# Patient Record
Sex: Female | Born: 1960
Health system: Southern US, Community
[De-identification: ages and names within clinical notes are randomized; demographics above are authoritative.]

## PROBLEM LIST (undated history)

## (undated) DIAGNOSIS — M797 Fibromyalgia: Secondary | ICD-10-CM

## (undated) DIAGNOSIS — K222 Esophageal obstruction: Secondary | ICD-10-CM

## (undated) DIAGNOSIS — M1990 Unspecified osteoarthritis, unspecified site: Secondary | ICD-10-CM

## (undated) DIAGNOSIS — E063 Autoimmune thyroiditis: Secondary | ICD-10-CM

## (undated) DIAGNOSIS — L719 Rosacea, unspecified: Secondary | ICD-10-CM

## (undated) DIAGNOSIS — F329 Major depressive disorder, single episode, unspecified: Secondary | ICD-10-CM

## (undated) DIAGNOSIS — F32A Depression, unspecified: Secondary | ICD-10-CM

## (undated) DIAGNOSIS — G43909 Migraine, unspecified, not intractable, without status migrainosus: Secondary | ICD-10-CM

## (undated) DIAGNOSIS — K449 Diaphragmatic hernia without obstruction or gangrene: Secondary | ICD-10-CM

## (undated) DIAGNOSIS — K219 Gastro-esophageal reflux disease without esophagitis: Secondary | ICD-10-CM

## (undated) DIAGNOSIS — C4490 Unspecified malignant neoplasm of skin, unspecified: Secondary | ICD-10-CM

## (undated) DIAGNOSIS — F419 Anxiety disorder, unspecified: Secondary | ICD-10-CM

## (undated) DIAGNOSIS — M3500 Sicca syndrome, unspecified: Secondary | ICD-10-CM

## (undated) DIAGNOSIS — S72002A Fracture of unspecified part of neck of left femur, initial encounter for closed fracture: Secondary | ICD-10-CM

## (undated) DIAGNOSIS — T7840XA Allergy, unspecified, initial encounter: Secondary | ICD-10-CM

## (undated) DIAGNOSIS — G629 Polyneuropathy, unspecified: Secondary | ICD-10-CM

## (undated) DIAGNOSIS — N879 Dysplasia of cervix uteri, unspecified: Secondary | ICD-10-CM

## (undated) DIAGNOSIS — E039 Hypothyroidism, unspecified: Secondary | ICD-10-CM

## (undated) HISTORY — DX: Migraine, unspecified, not intractable, without status migrainosus: G43.909

## (undated) HISTORY — DX: Depression, unspecified: F32.A

## (undated) HISTORY — DX: Sjogren syndrome, unspecified: M35.00

## (undated) HISTORY — DX: Rosacea, unspecified: L71.9

## (undated) HISTORY — PX: BREAST SURGERY: SHX581

## (undated) HISTORY — DX: Unspecified osteoarthritis, unspecified site: M19.90

## (undated) HISTORY — PX: FRACTURE SURGERY: SHX138

## (undated) HISTORY — DX: Polyneuropathy, unspecified: G62.9

## (undated) HISTORY — DX: Fibromyalgia: M79.7

## (undated) HISTORY — DX: Gastro-esophageal reflux disease without esophagitis: K21.9

## (undated) HISTORY — DX: Autoimmune thyroiditis: E06.3

## (undated) HISTORY — PX: JOINT REPLACEMENT: SHX530

## (undated) HISTORY — DX: Dysplasia of cervix uteri, unspecified: N87.9

## (undated) HISTORY — DX: Allergy, unspecified, initial encounter: T78.40XA

## (undated) HISTORY — DX: Major depressive disorder, single episode, unspecified: F32.9

## (undated) HISTORY — DX: Unspecified malignant neoplasm of skin, unspecified: C44.90

## (undated) HISTORY — DX: Anxiety disorder, unspecified: F41.9

## (undated) HISTORY — PX: COSMETIC SURGERY: SHX468

## (undated) HISTORY — DX: Hypothyroidism, unspecified: E03.9

## (undated) HISTORY — DX: Fracture of unspecified part of neck of left femur, initial encounter for closed fracture: S72.002A

## (undated) HISTORY — PX: TONSILLECTOMY AND ADENOIDECTOMY: SUR1326

---

## 1987-08-27 HISTORY — PX: DILATION AND CURETTAGE OF UTERUS: SHX78

## 1988-08-26 DIAGNOSIS — N879 Dysplasia of cervix uteri, unspecified: Secondary | ICD-10-CM

## 1988-08-26 HISTORY — DX: Dysplasia of cervix uteri, unspecified: N87.9

## 1998-11-16 ENCOUNTER — Other Ambulatory Visit: Admission: RE | Admit: 1998-11-16 | Discharge: 1998-11-16 | Payer: Self-pay | Admitting: Obstetrics and Gynecology

## 2000-01-17 ENCOUNTER — Other Ambulatory Visit: Admission: RE | Admit: 2000-01-17 | Discharge: 2000-01-17 | Payer: Self-pay | Admitting: Obstetrics and Gynecology

## 2001-05-01 ENCOUNTER — Other Ambulatory Visit: Admission: RE | Admit: 2001-05-01 | Discharge: 2001-05-01 | Payer: Self-pay | Admitting: Obstetrics and Gynecology

## 2002-06-18 ENCOUNTER — Other Ambulatory Visit: Admission: RE | Admit: 2002-06-18 | Discharge: 2002-06-18 | Payer: Self-pay | Admitting: Obstetrics and Gynecology

## 2003-08-01 ENCOUNTER — Other Ambulatory Visit: Admission: RE | Admit: 2003-08-01 | Discharge: 2003-08-01 | Payer: Self-pay | Admitting: Obstetrics and Gynecology

## 2003-08-27 DIAGNOSIS — S72002A Fracture of unspecified part of neck of left femur, initial encounter for closed fracture: Secondary | ICD-10-CM

## 2003-08-27 HISTORY — PX: TOTAL HIP ARTHROPLASTY: SHX124

## 2003-08-27 HISTORY — PX: ORIF HIP FRACTURE: SHX2125

## 2003-08-27 HISTORY — DX: Fracture of unspecified part of neck of left femur, initial encounter for closed fracture: S72.002A

## 2004-04-20 ENCOUNTER — Inpatient Hospital Stay (HOSPITAL_COMMUNITY): Admission: EM | Admit: 2004-04-20 | Discharge: 2004-04-21 | Payer: Self-pay

## 2004-07-31 ENCOUNTER — Other Ambulatory Visit: Admission: RE | Admit: 2004-07-31 | Discharge: 2004-07-31 | Payer: Self-pay | Admitting: Obstetrics and Gynecology

## 2004-08-16 ENCOUNTER — Inpatient Hospital Stay (HOSPITAL_COMMUNITY): Admission: RE | Admit: 2004-08-16 | Discharge: 2004-08-19 | Payer: Self-pay | Admitting: Orthopaedic Surgery

## 2004-09-06 ENCOUNTER — Encounter: Admission: RE | Admit: 2004-09-06 | Discharge: 2004-09-06 | Payer: Self-pay | Admitting: Obstetrics and Gynecology

## 2005-08-26 HISTORY — PX: BREAST ENHANCEMENT SURGERY: SHX7

## 2009-04-12 LAB — HM PAP SMEAR: HM Pap smear: NEGATIVE

## 2010-02-13 ENCOUNTER — Emergency Department (HOSPITAL_COMMUNITY): Admission: EM | Admit: 2010-02-13 | Discharge: 2010-02-14 | Payer: Self-pay | Admitting: Emergency Medicine

## 2010-02-14 ENCOUNTER — Encounter (INDEPENDENT_AMBULATORY_CARE_PROVIDER_SITE_OTHER): Payer: Self-pay | Admitting: *Deleted

## 2010-09-25 NOTE — Letter (Signed)
Summary: New Patient letter  [facility]  [address] [place], [place] [address]   Phone: [phone]  Fax: [phone]       02/14/2010 MRN: [id number]  [name] [address] [place], [place]  [address]  Dear [name],  Welcome to the Gastroenterology Division at [facility].    You are scheduled to see Dr.  Jarold Motto on 03-20-10 at 10:00a.m. on the 3rd floor at [facility], [address].  We ask that you try to arrive at our office 15 minutes prior to your appointment time to allow for check-in.  We would like you to complete the enclosed self-administered evaluation form prior to your visit and bring it with you on the day of your appointment.  We will review it with you.  Also, please bring a complete list of all your medications or, if you prefer, bring the medication bottles and we will list them.  Please bring your insurance card so that we may make a copy of it.  If your insurance requires a referral to see a specialist, please bring your referral form from your primary care physician.  Co-payments are due at the time of your visit and may be paid by cash, check or credit card.     Your office visit will consist of a consult with your physician (includes a physical exam), any laboratory testing he/she may order, scheduling of any necessary diagnostic testing (e.g. x-ray, ultrasound, CT-scan), and scheduling of a procedure (e.g. Endoscopy, Colonoscopy) if required.  Please allow enough time on your schedule to allow for any/all of these possibilities.    If you cannot keep your appointment, please call [phone] to cancel or reschedule prior to your appointment date.  This allows [place] the opportunity to schedule an appointment for another patient in need of care.  If you do not cancel or reschedule by 5 p.m. the business day prior to your appointment date, you will be charged a $50.00 late cancellation/no-show fee.    Thank you for  choosing [facility] for your medical needs.  We appreciate the opportunity to care for you.  Please visit [place] at our website  to learn more about our practice.                     Sincerely,                                                             The Gastroenterology Division

## 2010-11-11 LAB — CBC
HCT: 38.8 % (ref 36.0–46.0)
Hemoglobin: 13.5 g/dL (ref 12.0–15.0)
MCHC: 34.8 g/dL (ref 30.0–36.0)
MCV: 97.2 fL (ref 78.0–100.0)
RBC: 4 MIL/uL (ref 3.87–5.11)
RDW: 15.4 % (ref 11.5–15.5)

## 2010-11-11 LAB — COMPREHENSIVE METABOLIC PANEL
Albumin: 4.1 g/dL (ref 3.5–5.2)
Alkaline Phosphatase: 52 U/L (ref 39–117)
BUN: 16 mg/dL (ref 6–23)
Calcium: 9.5 mg/dL (ref 8.4–10.5)
Creatinine, Ser: 1.09 mg/dL (ref 0.4–1.2)
Glucose, Bld: 95 mg/dL (ref 70–99)
Total Protein: 7.1 g/dL (ref 6.0–8.3)

## 2010-11-11 LAB — DIFFERENTIAL
Basophils Absolute: 0 10*3/uL (ref 0.0–0.1)
Basophils Relative: 0 % (ref 0–1)
Eosinophils Absolute: 0.1 10*3/uL (ref 0.0–0.7)
Eosinophils Relative: 1 % (ref 0–5)
Monocytes Absolute: 0.9 10*3/uL (ref 0.1–1.0)
Monocytes Relative: 9 % (ref 3–12)
Neutro Abs: 7.1 10*3/uL (ref 1.7–7.7)

## 2010-11-11 LAB — URINALYSIS, ROUTINE W REFLEX MICROSCOPIC
Bilirubin Urine: NEGATIVE
Glucose, UA: NEGATIVE mg/dL
Ketones, ur: 15 mg/dL — AB
Protein, ur: NEGATIVE mg/dL
pH: 8.5 — ABNORMAL HIGH (ref 5.0–8.0)

## 2010-11-11 LAB — POCT I-STAT, CHEM 8
BUN: 22 mg/dL (ref 6–23)
Creatinine, Ser: 1.1 mg/dL (ref 0.4–1.2)
Potassium: 4 mEq/L (ref 3.5–5.1)
Sodium: 137 mEq/L (ref 135–145)
TCO2: 25 mmol/L (ref 0–100)

## 2010-11-11 LAB — LIPASE, BLOOD: Lipase: 33 U/L (ref 11–59)
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
## 2011-03-04 ENCOUNTER — Encounter: Payer: Self-pay | Admitting: Family Medicine

## 2011-03-04 ENCOUNTER — Ambulatory Visit (INDEPENDENT_AMBULATORY_CARE_PROVIDER_SITE_OTHER): Payer: BC Managed Care – PPO | Admitting: Family Medicine

## 2011-03-04 ENCOUNTER — Other Ambulatory Visit: Payer: Self-pay | Admitting: *Deleted

## 2011-03-04 ENCOUNTER — Other Ambulatory Visit (HOSPITAL_COMMUNITY)
Admission: RE | Admit: 2011-03-04 | Discharge: 2011-03-04 | Disposition: A | Discharge status: Home / Self Care | Payer: BC Managed Care – PPO | Source: Ambulatory Visit | Attending: Family Medicine | Admitting: Family Medicine

## 2011-03-04 VITALS — BP 110/82 | HR 73 | Temp 98.9°F | Ht 69.75 in | Wt 171.0 lb

## 2011-03-04 DIAGNOSIS — E039 Hypothyroidism, unspecified: Secondary | ICD-10-CM

## 2011-03-04 DIAGNOSIS — E038 Other specified hypothyroidism: Secondary | ICD-10-CM | POA: Insufficient documentation

## 2011-03-04 DIAGNOSIS — Z136 Encounter for screening for cardiovascular disorders: Secondary | ICD-10-CM

## 2011-03-04 DIAGNOSIS — R5383 Other fatigue: Secondary | ICD-10-CM

## 2011-03-04 DIAGNOSIS — Z113 Encounter for screening for infections with a predominantly sexual mode of transmission: Secondary | ICD-10-CM

## 2011-03-04 DIAGNOSIS — F32A Depression, unspecified: Secondary | ICD-10-CM

## 2011-03-04 DIAGNOSIS — J309 Allergic rhinitis, unspecified: Secondary | ICD-10-CM | POA: Insufficient documentation

## 2011-03-04 DIAGNOSIS — Z0000 Encounter for general adult medical examination without abnormal findings: Secondary | ICD-10-CM

## 2011-03-04 DIAGNOSIS — E063 Autoimmune thyroiditis: Secondary | ICD-10-CM | POA: Insufficient documentation

## 2011-03-04 DIAGNOSIS — J040 Acute laryngitis: Secondary | ICD-10-CM | POA: Insufficient documentation

## 2011-03-04 DIAGNOSIS — Z1159 Encounter for screening for other viral diseases: Secondary | ICD-10-CM | POA: Insufficient documentation

## 2011-03-04 DIAGNOSIS — F325 Major depressive disorder, single episode, in full remission: Secondary | ICD-10-CM | POA: Insufficient documentation

## 2011-03-04 DIAGNOSIS — Z01419 Encounter for gynecological examination (general) (routine) without abnormal findings: Secondary | ICD-10-CM | POA: Insufficient documentation

## 2011-03-04 DIAGNOSIS — F329 Major depressive disorder, single episode, unspecified: Secondary | ICD-10-CM

## 2011-03-04 DIAGNOSIS — Z1231 Encounter for screening mammogram for malignant neoplasm of breast: Secondary | ICD-10-CM

## 2011-03-04 DIAGNOSIS — F324 Major depressive disorder, single episode, in partial remission: Secondary | ICD-10-CM | POA: Insufficient documentation

## 2011-03-04 DIAGNOSIS — R5381 Other malaise: Secondary | ICD-10-CM

## 2011-03-04 LAB — RPR

## 2011-03-04 LAB — LIPID PANEL
Cholesterol: 199 mg/dL (ref 0–200)
LDL Cholesterol: 95 mg/dL (ref 0–99)
Triglycerides: 53 mg/dL (ref 0.0–149.0)
VLDL: 10.6 mg/dL (ref 0.0–40.0)

## 2011-03-04 LAB — POCT URINALYSIS DIPSTICK
Bilirubin, UA: NEGATIVE
Ketones, UA: NEGATIVE
Leukocytes, UA: NEGATIVE
Nitrite, UA: NEGATIVE
pH, UA: 6.5

## 2011-03-04 LAB — CBC WITH DIFFERENTIAL/PLATELET
Eosinophils Relative: 1.7 % (ref 0.0–5.0)
HCT: 40.9 % (ref 36.0–46.0)
Hemoglobin: 14.1 g/dL (ref 12.0–15.0)
Lymphs Abs: 1.6 10*3/uL (ref 0.7–4.0)
MCV: 96.2 fl (ref 78.0–100.0)
Monocytes Absolute: 0.5 10*3/uL (ref 0.1–1.0)
Monocytes Relative: 8.1 % (ref 3.0–12.0)
Neutro Abs: 4 10*3/uL (ref 1.4–7.7)
RDW: 14.8 % — ABNORMAL HIGH (ref 11.5–14.6)
WBC: 6.3 10*3/uL (ref 4.5–10.5)

## 2011-03-04 LAB — TSH: TSH: 1.22 u[IU]/mL (ref 0.35–5.50)

## 2011-03-04 MED ORDER — CLONAZEPAM 0.5 MG PO TBDP: 0.5000 mg | ORAL_TABLET | ORAL | Status: DC

## 2011-03-04 MED ORDER — BUPROPION HCL ER (XL) 150 MG PO TB24: ORAL_TABLET | ORAL | Status: DC

## 2011-03-04 NOTE — Patient Instructions (Signed)
Please stop by to see [name] on your way out. It was very nice to meet you.

## 2011-03-04 NOTE — Progress Notes (Signed)
Addended by: Gilmer Mor on: 03/04/2011 12:42 PM   Modules accepted: Orders

## 2011-03-04 NOTE — Telephone Encounter (Signed)
Please phone in as written below.

## 2011-03-04 NOTE — Telephone Encounter (Signed)
Rx called to pharmacy

## 2011-03-04 NOTE — Telephone Encounter (Signed)
Pt states she was seen as a new patient this morning and forgot to tell you that she takes klonopin and needs a refill.  She takes .5 mg's, one a day.  Uses [facility].

## 2011-03-04 NOTE — Progress Notes (Signed)
Subjective:    Patient ID: [name], female    DOB: 10/28/1960, 50 y.o.   MRN: [id number]  HPI  50 yo here to establish care and for CPX.  G2P2- last pap smear was 2 years ago, normal. S/p conization for cervical dysplasia in 1990.  Denies any vaginal complaints.  Premenopausal, has been receiving HRT but wants to stop. Wants to be checked for STDs- she and her husband had separated for short period of time, now reconciled.  Cough/nasal drainage- went to allergist last month, placed on nasal steroid and allegra.  No relief of symptoms. Still waking up several times per night with cough.  Allergist then advised Dexilant 60 mg daily and Ranitidine 300 mg daily for possible GERD. Symptoms have not improved- intermittent laryngitis, dry cough, nasal congestion for months. No fevers, chills or SOB.  Fatigue- ongoing issue for years. On Armour 60 mg daily for hypothyroidism.  Needs to be rechecked. Stopped her Wellbutrin, now feels "crazy." No SI or HI, just feels unsettled and tearful.   Review of Systems See HPI Patient reports no  vision/ hearing changes,anorexia, weight change, fever ,adenopathy, , swallowing issues, chest pain, edema, hemoptysis, dyspnea(rest, exertional, paroxysmal nocturnal), gastrointestinal  bleeding (melena, rectal bleeding), abdominal pain, , GU symptoms(dysuria, hematuria, pyuria, voiding/incontinence  Issues) syncope, focal weakness, severe memory loss, concerning skin lesions,  abnormal bruising/bleeding, major joint swelling, breast masses or abnormal vaginal bleeding.       Objective:   Physical Exam BP 110/82  Pulse 73  Temp(Src) 98.9 F (37.2 C) (Oral)  Ht 5' 9.75" (1.772 m)  Wt 171 lb (77.565 kg)  BMI 24.71 kg/m2  LMP 02/23/2011  General:  Well-developed,well-nourished,in no acute distress; alert,appropriate and cooperative throughout examination Head:  normocephalic and atraumatic.   Eyes:  vision grossly intact, pupils equal, pupils  round, and pupils reactive to light.   Ears:  R ear normal and L ear normal.   Nose:  no external deformity.   Mouth:  good dentition.   Neck:  No deformities, masses, or tenderness noted. Breasts:  No mass, nodules, thickening, tenderness, bulging, retraction, inflamation, nipple discharge or skin changes noted, + breast implants   Lungs:  Normal respiratory effort, chest expands symmetrically. Lungs are clear to auscultation, no crackles or wheezes. Heart:  Normal rate and regular rhythm. S1 and S2 normal without gallop, murmur, click, rub or other extra sounds. Abdomen:  Bowel sounds positive,abdomen soft and non-tender without masses, organomegaly or hernias noted. Rectal:  no external abnormalities.   Genitalia:  Pelvic Exam:        External: normal female genitalia without lesions or masses        Vagina: normal without lesions or masses        Cervix: normal without lesions or masses        Adnexa: normal bimanual exam without masses or fullness        Uterus: normal by palpation        Pap smear: performed Msk:  No deformity or scoliosis noted of thoracic or lumbar spine.   Extremities:  No clubbing, cyanosis, edema, or deformity noted with normal full range of motion of all joints.   Neurologic:  alert & oriented X3 and gait normal.   Skin:  Intact without suspicious lesions or rashes Cervical Nodes:  No lymphadenopathy noted Axillary Nodes:  No palpable lymphadenopathy Psych:  Cognition and judgment appear intact. Alert and cooperative with normal attention span and concentration. No apparent delusions, illusions, hallucinations  Assessment & Plan:   1. Routine general medical examination at a health care facility   Reviewed preventive care protocols, scheduled due services, and updated immunizations Discussed nutrition, exercise, diet, and healthy lifestyle.  Pap smear performed today, set up mammogram. Lipid panel, BMET  2. Fatigue     Likely  mulitfactorial. Fibromyalgia may be playing a role. Check labs today first.  3. Allergic rhinitis  Continue current meds, refer to ENT.  4. Depression  Restart Wellbutrin today.  5 Hypothyroidism  TSH today  6. Laryngitis  Ambulatory referral to ENT    7 Screening for STD (sexually transmitted disease)

## 2011-03-05 ENCOUNTER — Other Ambulatory Visit: Payer: Self-pay | Admitting: Family Medicine

## 2011-03-05 DIAGNOSIS — R768 Other specified abnormal immunological findings in serum: Secondary | ICD-10-CM

## 2011-03-05 LAB — ANA: Anti Nuclear Antibody(ANA): POSITIVE — AB

## 2011-03-05 LAB — ANTI-NUCLEAR AB-TITER (ANA TITER): ANA Titer 1: NEGATIVE

## 2011-03-07 ENCOUNTER — Encounter: Payer: Self-pay | Admitting: *Deleted

## 2011-03-11 ENCOUNTER — Encounter: Payer: Self-pay | Admitting: Family Medicine

## 2011-03-22 ENCOUNTER — Other Ambulatory Visit: Payer: Self-pay | Admitting: *Deleted

## 2011-03-22 MED ORDER — HYDROXYZINE HCL 50 MG PO TABS: 50.0000 mg | ORAL_TABLET | Freq: Four times a day (QID) | ORAL | Status: DC | PRN

## 2011-03-22 NOTE — Telephone Encounter (Signed)
Patient says that she did not realized her rx had expired. She says that she has broken out in hives again and needs this refilled.

## 2011-03-28 ENCOUNTER — Ambulatory Visit: Payer: BC Managed Care – PPO

## 2011-03-28 ENCOUNTER — Telehealth: Payer: Self-pay | Admitting: *Deleted

## 2011-03-28 MED ORDER — OMEPRAZOLE 40 MG PO CPDR: DELAYED_RELEASE_CAPSULE | ORAL | Status: DC

## 2011-03-28 NOTE — Telephone Encounter (Signed)
That's very high dose. We can do that for 4 weeks. Rx sent.

## 2011-03-28 NOTE — Telephone Encounter (Signed)
Patient saw you last week  As a NP for reflux symptoms and was referred to ENT to be sure that the symptoms were not from something more serious.  ENT recommended doubling the Omeprazole  that was prescribed by her previous physician and she can't request that from him any longer.  Will you please send in a Rx for Omeprazole ER 40 mg. Twice daily to [facility]?  Please advise patient.

## 2011-03-28 NOTE — Telephone Encounter (Signed)
Patient advised as instructed via telephone. 

## 2011-04-03 ENCOUNTER — Other Ambulatory Visit: Payer: Self-pay | Admitting: *Deleted

## 2011-04-03 MED ORDER — BUPROPION HCL ER (XL) 300 MG PO TB24: 300.0000 mg | ORAL_TABLET | Freq: Every day | ORAL | Status: DC

## 2011-04-03 NOTE — Telephone Encounter (Signed)
Rx updated in epic, Rx called to pharmacy.

## 2011-04-03 NOTE — Telephone Encounter (Signed)
Last refilled 03/04/2011.

## 2011-04-03 NOTE — Telephone Encounter (Signed)
Cancel this script  Send in wellbutrin XL 300 mg, 1 po daily, #30, 0 refills

## 2011-04-09 ENCOUNTER — Other Ambulatory Visit: Payer: Self-pay | Admitting: *Deleted

## 2011-04-09 MED ORDER — THYROID 60 MG PO TABS: 60.0000 mg | ORAL_TABLET | Freq: Every day | ORAL | Status: DC

## 2011-05-02 ENCOUNTER — Other Ambulatory Visit: Payer: Self-pay | Admitting: *Deleted

## 2011-05-02 MED ORDER — BUPROPION HCL ER (XL) 300 MG PO TB24: 300.0000 mg | ORAL_TABLET | Freq: Every day | ORAL | Status: DC

## 2011-05-16 ENCOUNTER — Ambulatory Visit: Payer: BC Managed Care – PPO

## 2011-05-23 ENCOUNTER — Encounter: Payer: Self-pay | Admitting: Family Medicine

## 2011-05-23 ENCOUNTER — Ambulatory Visit (INDEPENDENT_AMBULATORY_CARE_PROVIDER_SITE_OTHER): Payer: BC Managed Care – PPO | Admitting: Family Medicine

## 2011-05-23 VITALS — BP 104/78 | HR 71 | Temp 98.3°F | Wt 170.5 lb

## 2011-05-23 DIAGNOSIS — G44229 Chronic tension-type headache, not intractable: Secondary | ICD-10-CM

## 2011-05-23 DIAGNOSIS — G43901 Migraine, unspecified, not intractable, with status migrainosus: Secondary | ICD-10-CM | POA: Insufficient documentation

## 2011-05-23 DIAGNOSIS — G43909 Migraine, unspecified, not intractable, without status migrainosus: Secondary | ICD-10-CM

## 2011-05-23 MED ORDER — RANITIDINE HCL 300 MG PO TABS: 300.0000 mg | ORAL_TABLET | ORAL | Status: DC

## 2011-05-23 MED ORDER — BUPROPION HCL ER (XL) 300 MG PO TB24: 300.0000 mg | ORAL_TABLET | Freq: Every day | ORAL | Status: DC

## 2011-05-23 MED ORDER — OMEPRAZOLE 40 MG PO CPDR: DELAYED_RELEASE_CAPSULE | ORAL | Status: DC

## 2011-05-23 MED ORDER — TRAMADOL HCL 50 MG PO TABS: 50.0000 mg | ORAL_TABLET | Freq: Four times a day (QID) | ORAL | Status: AC | PRN

## 2011-05-23 MED ORDER — SUMATRIPTAN SUCCINATE 50 MG PO TABS: 50.0000 mg | ORAL_TABLET | ORAL | Status: DC | PRN

## 2011-05-23 NOTE — Patient Instructions (Signed)
Good to see you. Please stop by to see [name] on your way out. 

## 2011-05-23 NOTE — Progress Notes (Signed)
  Subjective:    Patient ID: [name], female    DOB: August 15, 1961, 50 y.o.   MRN: [id number]  HPI  50 yo here to discuss headaches.  H/o migraines- takes as needed Imitrex. Also gets evening tension headaches. Over past few months, headaches are changing.  Now has a headache almost every morning- usually behind her eyes or bilateral temple area. Sometimes headache wake her up at 3 or 4 am. No associated nausea, vomiting, photophobia or phonophobia. No focal neurological deficits. Does grind her teeth and often has ear pain.  Has been seeing an allergist, on multiple medications for her allergic rhinitis including Atarax, Allegra, recently stopped taking steroid nasal spray because she was concerned it was worsening her headaches.  Review of Systems See HPI        Objective:   Physical Exam BP 104/78  Pulse 71  Temp(Src) 98.3 F (36.8 C) (Oral)  Wt 170 lb 8 oz (77.338 kg)  LMP 05/22/2011  General:  Well-developed,well-nourished,in no acute distress; alert,appropriate and cooperative throughout examination Head:  normocephalic and atraumatic.   Eyes:  vision grossly intact, pupils equal, pupils round, and pupils reactive to light.   Ears:  R ear normal and L ear normal.   Nose:  no external deformity.   Mouth:  good dentition.   Neck:  No deformities, masses, or tenderness noted. Breasts:  No mass, nodules, thickening, tenderness, bulging, retraction, inflamation, nipple discharge or skin changes noted, + breast implants   Lungs:  Normal respiratory effort, chest expands symmetrically. Lungs are clear to auscultation, no crackles or wheezes. Heart:  Normal rate and regular rhythm. S1 and S2 normal without gallop, murmur, click, rub or other extra sounds. Msk:  No deformity or scoliosis noted of thoracic or lumbar spine.   Extremities:  No clubbing, cyanosis, edema, or deformity noted with normal full range of motion of all joints.   Neurologic:  alert & oriented X3 and  gait normal, CN II- XII intact.   Skin:  Intact without suspicious lesions or rashes Cervical Nodes:  No lymphadenopathy noted Psych:  Cognition and judgment appear intact. Alert and cooperative with normal attention span and concentration. No apparent delusions, illusions, hallucinations        Assessment & Plan:   1. Headaches AMB referral to headache clinic  Deteriorated and changing in characteristics. Likely multifactorial- ?tension, rebound headache from NSAID use and polypharmacy. [facility]. Advised to start keeping a headache journal. The patient indicates understanding of these issues and agrees with the plan.

## 2011-06-10 ENCOUNTER — Telehealth: Payer: Self-pay | Admitting: *Deleted

## 2011-06-10 NOTE — Telephone Encounter (Signed)
Prilosec usually works a bit better but zantac is a safer longer term medication. If it were me, I would try taking Zantac only to see if I could get by with that.  i would also try taking it in the evening if that is when she has most of her symptoms.

## 2011-06-10 NOTE — Telephone Encounter (Signed)
Patient advised as instructed via telephone.  She will take Zantac and let us know how she is doing on it.

## 2011-06-10 NOTE — Telephone Encounter (Signed)
Patient called stating that she is taking both Zantac and Prilosec and doesn't want to continue to take them both.  She would like to know which medication Dr. Dayton Martes thinks will work best for her and when to take it, morning or evening.  Please advise.

## 2011-07-20 ENCOUNTER — Ambulatory Visit (INDEPENDENT_AMBULATORY_CARE_PROVIDER_SITE_OTHER): Payer: BC Managed Care – PPO | Admitting: Family Medicine

## 2011-07-20 ENCOUNTER — Encounter: Payer: Self-pay | Admitting: Family Medicine

## 2011-07-20 VITALS — BP 118/80 | HR 97 | Temp 97.7°F | Wt 170.0 lb

## 2011-07-20 DIAGNOSIS — J329 Chronic sinusitis, unspecified: Secondary | ICD-10-CM

## 2011-07-20 MED ORDER — AMOXICILLIN-POT CLAVULANATE 875-125 MG PO TABS: 1.0000 | ORAL_TABLET | Freq: Two times a day (BID) | ORAL | Status: AC

## 2011-07-20 MED ORDER — PREDNISONE 20 MG PO TABS: ORAL_TABLET | ORAL | Status: DC

## 2011-07-20 NOTE — Progress Notes (Signed)
SUBJECTIVE:  [name] is a 50 y.o. female who complains of coryza, congestion and sinus and teeth pain for 21days. She denies a history of chest pain, chills, vomiting and weakness and denies a history of asthma. Patient denies smoke cigarettes.   OBJECTIVE: BP 118/80  Pulse 97  Temp(Src) 97.7 F (36.5 C) (Oral)  Wt 170 lb (77.111 kg)  She appears well, vital signs are as noted. Ears normal.  Throat and pharynx normal.  Neck supple. No adenopathy in the neck. Nose is congested, erythematous with some edema. Sinuses tender to palpation throughout. The chest is clear, without wheezes or rales.  Patient Active Problem List  Diagnoses  . Fatigue  . Laryngitis  . Allergic rhinitis  . Depression  . Hypothyroidism  . Migraine   Past Medical History  Diagnosis Date  . Hip fracture, left 2005    s/p ORIF  . Hypothyroidism   . Fibromyalgia   . Depression   . Allergic rhinitis   . Chronic sinusitis   . Cervical dysplasia 1990    s/p conization   Past Surgical History  Procedure Date  . Orif hip fracture 2005    Daldorf  . Breast enhancement surgery 2007  . Tonsillectomy and adenoidectomy    History  Substance Use Topics  . Smoking status: Never Smoker   . Smokeless tobacco: Not on file  . Alcohol Use: Not on file   Family History  Problem Relation Age of Onset  . Arthritis Mother     RA and OA  . Cancer Maternal Aunt 48    breast CA   No Known Allergies Current Outpatient Prescriptions on File Prior to Visit  Medication Sig Dispense Refill  . Beclomethasone Dipropionate (QNASL) 80 MCG/ACT AERS Place 2 sprays into the nose daily.        Marland Kitchen buPROPion (WELLBUTRIN XL) 300 MG 24 hr tablet Take 1 tablet (300 mg total) by mouth daily.  30 tablet  6  . Cholecalciferol (VITAMIN D3) 2000 UNITS TABS Take 1 tablet by mouth daily.        . fexofenadine (ALLEGRA) 180 MG tablet Take 180 mg by mouth daily.        . hydrOXYzine (ATARAX) 50 MG tablet Take 1 tablet (50 mg total)  by mouth every 6 (six) hours as needed.  30 tablet  0  . magnesium oxide (MAG-OX) 400 MG tablet Take 400 mg by mouth daily.        . Multiple Vitamin (MULTIVITAMIN) capsule Take 1 capsule by mouth daily.        Marland Kitchen omeprazole (PRILOSEC) 40 MG capsule I tab po twice daily   60 capsule  12  . pyridOXINE (VITAMIN B-6) 100 MG tablet Take 100 mg by mouth daily.        . ranitidine (ZANTAC) 300 MG tablet Take 1 tablet (300 mg total) by mouth every morning.  30 tablet  12  . SUMAtriptan (IMITREX) 50 MG tablet Take 1 tablet (50 mg total) by mouth as needed.  10 tablet  1  . thyroid (ARMOUR) 60 MG tablet Take 1 tablet (60 mg total) by mouth daily.  90 tablet  3  . traMADol (ULTRAM) 50 MG tablet Take 1 tablet (50 mg total) by mouth every 6 (six) hours as needed for pain.  20 tablet  0  . Zinc 25 MG TABS Take 1 tablet by mouth daily.          ASSESSMENT:  sinusitis  PLAN: Given  duration and progression of symptoms, will treat for bacterial sinusitis with 10 day course of Augmentin. Chronic use of abx and mutliple allergy medications with significant edema, will treat with prednisone as well. Discussed referral to ENT. She will think about it. Symptomatic therapy suggested: push fluids, rest and return office visit prn if symptoms persist or worsen.Call or return to clinic prn if these symptoms worsen or fail to improve as anticipated.

## 2011-07-20 NOTE — Patient Instructions (Signed)
Good to see you. Please try to leave an hour in between when you take your prilosec and other medications. Call me next week with an update.

## 2011-07-26 ENCOUNTER — Encounter (INDEPENDENT_AMBULATORY_CARE_PROVIDER_SITE_OTHER): Payer: BC Managed Care – PPO | Admitting: Ophthalmology

## 2011-07-30 ENCOUNTER — Telehealth: Payer: Self-pay | Admitting: Internal Medicine

## 2011-07-30 MED ORDER — ZOLPIDEM TARTRATE 5 MG PO TABS: 5.0000 mg | ORAL_TABLET | Freq: Every day | ORAL | Status: DC

## 2011-07-30 NOTE — Telephone Encounter (Signed)
The issue with a sleep aid is it can worsen her migraines. Ok to call in Gilgo 5 mg qhs, 30 tablets with no refills if she still wants to take something.

## 2011-07-30 NOTE — Telephone Encounter (Signed)
Saw Dr. Dayton Martes a week ago at [facility] and she stated she had updated her on her migraines and now by Dr. Neale Burly she is receiving trigger point injections.  She stated that she is not sleeping at night that the Tramadol puts her to sleep but she wakes up in the middle of the night. She talked with Dr. Neale Burly and he doesn't believe in giving her anything but Tramadol and wanted to know if you would give her something to help her sleep through the night.  Please advise.

## 2011-07-30 NOTE — Telephone Encounter (Signed)
Patient advised as instructed via telephone.  She does want Rx for Ambien.  Rx called to [facility] per her request.

## 2011-08-02 ENCOUNTER — Encounter (INDEPENDENT_AMBULATORY_CARE_PROVIDER_SITE_OTHER): Payer: BC Managed Care – PPO | Admitting: Ophthalmology

## 2011-08-02 ENCOUNTER — Other Ambulatory Visit: Payer: Self-pay | Admitting: Specialist

## 2011-08-09 ENCOUNTER — Other Ambulatory Visit: Payer: BC Managed Care – PPO

## 2011-08-13 ENCOUNTER — Telehealth: Payer: Self-pay | Admitting: Internal Medicine

## 2011-08-13 MED ORDER — BUPROPION HCL ER (XL) 150 MG PO TB24: 150.0000 mg | ORAL_TABLET | Freq: Every day | ORAL | Status: DC

## 2011-08-13 NOTE — Telephone Encounter (Signed)
Patient called in and wanted to taper down on Wellbutrin to come off because she is trying to figure out what is causing her migraines.  She just wanted to know if you could call in a lower dose to start tapering down so she can come off of it.  Please advise.

## 2011-08-13 NOTE — Telephone Encounter (Signed)
Patient advised as instructed via message left on cell phone voicemail. 

## 2011-08-13 NOTE — Telephone Encounter (Signed)
Often it is not necessary to wean off of Wellbutrin but since she has been on it for awhile, probably a good idea. I sent in rx for lower dose- take 150 mg daily. Ok to stop completely after she has taken it daily for 1 week, then every other day for 1 week.

## 2011-09-23 ENCOUNTER — Encounter: Payer: Self-pay | Admitting: Family Medicine

## 2011-10-08 ENCOUNTER — Telehealth: Payer: Self-pay | Admitting: Family Medicine

## 2011-10-08 MED ORDER — LEVONORGEST-ETH ESTRAD 91-DAY 0.15-0.03 MG PO TABS: 1.0000 | ORAL_TABLET | Freq: Every day | ORAL | Status: DC

## 2011-10-08 NOTE — Telephone Encounter (Signed)
Patient advised as instructed via telephone. 

## 2011-10-08 NOTE — Telephone Encounter (Signed)
Triage Record Num: [id number] Operator: Neita Goodnight Patient Name: [name] Call Date & Time: 10/08/2011 12:55:11PM Patient Phone: [phone] PCP: Ruthe Mannan Patient Gender: Female PCP Fax : [phone] Patient DOB: 05-02-1961 Practice Name: [facility] Reason for Call: Caller: Chanon/Patient is calling with a question about "B/C To Help Prevent Migraines." Is having flareups of Migraines, and is seeing a specialist (Dr. Neale Burly). Worst time is around menstrual cycle. Has called the specialist, who did approve taking "the birth control pill that will allow only 4 periods per year" along with her other medications. Dr. Neale Burly will not prescribe this, must be from PCP. Triage offered and declined. OFFICE PLEASE CALL [facility] AT [phone] AND ADVISE IF BIRTH CONTROL (4 PERIODS/YEAR) HAS BEEN CALLED IN. [facility], [phone]. Protocol(s) Used: Office Note Recommended Outcome per Protocol: Information Noted and Sent to Office Reason for Outcome: Caller information to office Care Advice: ~ 10/08/2011 1:02:33PM Page 1 of 1 [email or web address]

## 2011-10-08 NOTE — Telephone Encounter (Signed)
[name] was sent to her pharmacy but I would check with Dr. Neale Burly before taking any birth control pills because it can effect efficacy of her migraine medication.

## 2011-10-30 ENCOUNTER — Telehealth: Payer: Self-pay | Admitting: Family Medicine

## 2011-10-30 NOTE — Telephone Encounter (Signed)
Camelia Eng would know which lab test this is as we had another pt that asked for this test. Yes it can be done here.

## 2011-10-30 NOTE — Telephone Encounter (Signed)
Triage Record Num: [id number] Operator: Di Kindle Patient Name: [name] Call Date & Time: 10/30/2011 10:37:46AM Patient Phone: [phone] PCP: Ruthe Mannan Patient Gender: Female PCP Fax : [phone] Patient DOB: 20-Oct-1960 Practice Name: [facility] Reason for Call: OFFICE Please Caller: [name]; PCP: Gwinda Passe); CB#: [phone]; Call regarding Headache; with fatigue, concern follows hip replacemnt, with metal concerns of metal on metal, wants to know if she can be tested for metal ions in her blood, can this be done in this office? states plans to schedule follow-uo with continuing headaches. Please call with recommendations [phone]. OFFICE PLEASE OFFICE PLEASE Protocol(s) Used: PCP Calls, No Triage (Adult) Recommended Outcome per Protocol: Call Provider within 24 Hours Reason for Outcome: [1] Caller requests to speak ONLY to PCP AND [2] nonurgent question Care Advice: ~ 10/30/2011 10:43:21AM Page 1 of 1 [email or web address]

## 2011-10-31 NOTE — Telephone Encounter (Signed)
Left message on machine asking pt to call back. 

## 2011-11-01 ENCOUNTER — Encounter: Payer: Self-pay | Admitting: Family Medicine

## 2011-11-01 ENCOUNTER — Ambulatory Visit (INDEPENDENT_AMBULATORY_CARE_PROVIDER_SITE_OTHER): Payer: BC Managed Care – PPO | Admitting: Family Medicine

## 2011-11-01 VITALS — BP 92/58 | HR 56 | Temp 98.1°F | Wt 161.0 lb

## 2011-11-01 DIAGNOSIS — Z77018 Contact with and (suspected) exposure to other hazardous metals: Secondary | ICD-10-CM

## 2011-11-01 NOTE — Progress Notes (Signed)
Subjective:    Patient ID: [name], female    DOB: 07/26/1961, 51 y.o.   MRN: [id number]  HPI  51 yo here to discuss two concerns:  1.?  Metal toxicity- had left hip replacement over 7 years ago (not model that was recalled). [facility] for refractory migraines and wants to make sure that the cause is not metal toxicity. No pain or swelling of her hip.  2.  Right shoulder pain- no known injury. Past few weeks, very painful to reach behind her. No pain or difficulty lifting arm overhead. No radiculopathy or UE weakness.  Patient Active Problem List  Diagnoses  . Fatigue  . Laryngitis  . Allergic rhinitis  . Depression  . Hypothyroidism  . Migraine   Past Medical History  Diagnosis Date  . Hip fracture, left 2005    s/p ORIF  . Hypothyroidism   . Fibromyalgia   . Depression   . Allergic rhinitis   . Chronic sinusitis   . Cervical dysplasia 1990    s/p conization   Past Surgical History  Procedure Date  . Orif hip fracture 2005    Daldorf  . Breast enhancement surgery 2007  . Tonsillectomy and adenoidectomy    History  Substance Use Topics  . Smoking status: Never Smoker   . Smokeless tobacco: Not on file  . Alcohol Use: Not on file   Family History  Problem Relation Age of Onset  . Arthritis Mother     RA and OA  . Cancer Maternal Aunt 48    breast CA   No Known Allergies Current Outpatient Prescriptions on File Prior to Visit  Medication Sig Dispense Refill  . baclofen (LIORESAL) 10 MG tablet       . fexofenadine (ALLEGRA) 180 MG tablet Take 180 mg by mouth daily.        . hydrOXYzine (ATARAX) 50 MG tablet Take 1 tablet (50 mg total) by mouth every 6 (six) hours as needed.  30 tablet  0  . levonorgestrel-ethinyl estradiol (SEASONALE) 0.15-0.03 MG tablet Take 1 tablet by mouth daily.  1 Package  4  . magnesium oxide (MAG-OX) 400 MG tablet Take 400 mg by mouth daily.        . Multiple Vitamin (MULTIVITAMIN)  capsule Take 1 capsule by mouth daily.        . SUMAtriptan (IMITREX) 50 MG tablet Take 1 tablet (50 mg total) by mouth as needed.  10 tablet  1  . thyroid (ARMOUR) 60 MG tablet Take 1 tablet (60 mg total) by mouth daily.  90 tablet  3  . zolpidem (AMBIEN) 5 MG tablet Take 1 tablet (5 mg total) by mouth at bedtime.  30 tablet  0  . Beclomethasone Dipropionate (QNASL) 80 MCG/ACT AERS Place 2 sprays into the nose daily.        Marland Kitchen buPROPion (WELLBUTRIN XL) 150 MG 24 hr tablet Take 1 tablet (150 mg total) by mouth daily.  30 tablet  6  . Cholecalciferol (VITAMIN D3) 2000 UNITS TABS Take 1 tablet by mouth daily.        Marland Kitchen omeprazole (PRILOSEC) 40 MG capsule I tab po twice daily   60 capsule  12  . predniSONE (DELTASONE) 20 MG tablet 3 tabs by mouth for 3 days, 2 tabs by mouth for 2 days, 1 tab by mouth for 2 days, 1/2 tab by mouth x 2 days and stop Dispense qs  1 tablet  0  . pyridOXINE (VITAMIN B-6) 100 MG tablet Take 100 mg by mouth daily.        . ranitidine (ZANTAC) 300 MG tablet Take 1 tablet (300 mg total) by mouth every morning.  30 tablet  12  . topiramate (TOPAMAX) 25 MG tablet       . traMADol (ULTRAM) 50 MG tablet Take 1 tablet (50 mg total) by mouth every 6 (six) hours as needed for pain.  20 tablet  0  . Zinc 25 MG TABS Take 1 tablet by mouth daily.         The PMH, PSH, Social History, Family History, Medications, and allergies have been reviewed in [facility], and have been updated if relevant.   Review of Systems See HPI        Objective:   Physical Exam BP 92/58  Pulse 56  Temp(Src) 98.1 F (36.7 C) (Oral)  Wt 161 lb (73.029 kg)  Appearance: alert, well appearing, and in no distress and oriented to person, place, and time. Shoulder exam: pain elicited when she touches thumb to middle of back with resistance (pos lift off and [place]), positive impingement signs, remainder of shoulder exam is normal, ipsilateral elbow, wrist and hand exam is normal, contralateral shoulder exam is  normal.    Assessment & Plan:   1.  ?Metal toxicity- Orders Placed This Encounter  Procedures  . Chromium and Cobalt, WB [[facility]]   2.  Right rotator cuff impingement- likely subscapularis. Treat with conservative therapy. See pt instructions for details.

## 2011-11-01 NOTE — Patient Instructions (Signed)
You have rotator cuff impingement Try to avoid painful activities (overhead activities, lifting with extended arm) as much as possible. Subacromial injection may be beneficial to help with pain and to decrease inflammation. Do home exercise program. If not improving at follow-up we will consider further imaging and/or physical therapy.

## 2011-11-14 LAB — CHROMIUM AND COBALT, WB

## 2011-12-09 ENCOUNTER — Telehealth: Payer: Self-pay | Admitting: Family Medicine

## 2011-12-09 NOTE — Telephone Encounter (Signed)
Patient called the office to speak with manager in regards to her previous appointment on 11/01/2011. She stated that she got lab work done that day and later she received a called from [place] letting her know that she had to repeat the labs. It was our error and therefore patient would now have to go to [facility] to get tested for Metal toxicity. She called [facility] and they informed her that this is an expensive procedure and since we had made an error, most likely insurance will not cover it. Patient stated that she will not pay again since it was our mistake. She would like to speak to someone in regards to this, whether we have already billed her insurance and how can she still get tested. Best number to reach her is [phone]

## 2011-12-17 NOTE — Telephone Encounter (Signed)
lmom for patient to return my call 

## 2011-12-17 NOTE — Telephone Encounter (Signed)
Reviewing tests performed at [facility] labs and billing to follow up.

## 2011-12-20 NOTE — Telephone Encounter (Signed)
Patient returned my call, I explaind that there was nothing billed since test was cancelled. Also, she will go to [facility] and have test done there.

## 2011-12-23 ENCOUNTER — Encounter: Payer: Self-pay | Admitting: Family Medicine

## 2012-01-10 ENCOUNTER — Other Ambulatory Visit: Payer: Self-pay | Admitting: *Deleted

## 2012-01-10 MED ORDER — HYDROXYZINE HCL 50 MG PO TABS: 50.0000 mg | ORAL_TABLET | Freq: Four times a day (QID) | ORAL | Status: DC | PRN

## 2012-01-10 NOTE — Telephone Encounter (Signed)
Faxed refill request from [facility], request is for # [phone], with instructions to take 2 tablets every 6 hours, instructions in chart say to take one.  Last filled 60 on 03/22/11.

## 2012-02-12 ENCOUNTER — Telehealth: Payer: Self-pay

## 2012-02-12 NOTE — Telephone Encounter (Signed)
Pt taking Jolessa 3 month pk to help control migraine. Pt said Jolessa not working. Pt took for 3 month period and has taken one month of second pack. Pt wants to know if OK to stop after taking 1 month of 3 month pack; when will her cycle start? [facility].Please advise.

## 2012-02-12 NOTE — Telephone Encounter (Signed)
Yes ok to stop.  She should get her period shortly after finishing the pack.

## 2012-02-13 NOTE — Telephone Encounter (Signed)
Advised patient

## 2012-03-04 ENCOUNTER — Other Ambulatory Visit: Payer: Self-pay | Admitting: Family Medicine

## 2012-03-04 DIAGNOSIS — Z136 Encounter for screening for cardiovascular disorders: Secondary | ICD-10-CM

## 2012-03-04 DIAGNOSIS — E039 Hypothyroidism, unspecified: Secondary | ICD-10-CM

## 2012-03-04 DIAGNOSIS — Z0000 Encounter for general adult medical examination without abnormal findings: Secondary | ICD-10-CM

## 2012-03-06 ENCOUNTER — Other Ambulatory Visit (INDEPENDENT_AMBULATORY_CARE_PROVIDER_SITE_OTHER): Payer: BC Managed Care – PPO

## 2012-03-06 DIAGNOSIS — Z0000 Encounter for general adult medical examination without abnormal findings: Secondary | ICD-10-CM

## 2012-03-06 DIAGNOSIS — E039 Hypothyroidism, unspecified: Secondary | ICD-10-CM

## 2012-03-06 DIAGNOSIS — Z136 Encounter for screening for cardiovascular disorders: Secondary | ICD-10-CM

## 2012-03-06 LAB — CBC WITH DIFFERENTIAL/PLATELET
Basophils Absolute: 0.1 10*3/uL (ref 0.0–0.1)
Eosinophils Absolute: 0.2 10*3/uL (ref 0.0–0.7)
MCHC: 33.8 g/dL (ref 30.0–36.0)
MCV: 94.7 fl (ref 78.0–100.0)
Monocytes Absolute: 0.6 10*3/uL (ref 0.1–1.0)
Neutrophils Relative %: 53.9 % (ref 43.0–77.0)
Platelets: 249 10*3/uL (ref 150.0–400.0)
RDW: 14 % (ref 11.5–14.6)

## 2012-03-06 LAB — LIPID PANEL
Cholesterol: 188 mg/dL (ref 0–200)
HDL: 70.3 mg/dL (ref >39.00)
LDL Cholesterol: 101 mg/dL — ABNORMAL HIGH (ref 0–99)
VLDL: 16.4 mg/dL (ref 0.0–40.0)

## 2012-03-06 LAB — COMPREHENSIVE METABOLIC PANEL
AST: 13 U/L (ref 0–37)
Alkaline Phosphatase: 51 U/L (ref 39–117)
BUN: 20 mg/dL (ref 6–23)
Glucose, Bld: 93 mg/dL (ref 70–99)
Sodium: 138 mEq/L (ref 135–145)
Total Bilirubin: 0.4 mg/dL (ref 0.3–1.2)
Total Protein: 7.2 g/dL (ref 6.0–8.3)

## 2012-03-06 LAB — TSH: TSH: 1.69 u[IU]/mL (ref 0.35–5.50)

## 2012-03-11 ENCOUNTER — Ambulatory Visit (INDEPENDENT_AMBULATORY_CARE_PROVIDER_SITE_OTHER): Payer: BC Managed Care – PPO | Admitting: Family Medicine

## 2012-03-11 ENCOUNTER — Encounter: Payer: Self-pay | Admitting: Family Medicine

## 2012-03-11 ENCOUNTER — Other Ambulatory Visit (HOSPITAL_COMMUNITY)
Admission: RE | Admit: 2012-03-11 | Discharge: 2012-03-11 | Disposition: A | Discharge status: Home / Self Care | Payer: BC Managed Care – PPO | Source: Ambulatory Visit | Attending: Family Medicine | Admitting: Family Medicine

## 2012-03-11 VITALS — BP 98/66 | HR 76 | Temp 98.3°F | Ht 69.75 in | Wt 172.0 lb

## 2012-03-11 DIAGNOSIS — E039 Hypothyroidism, unspecified: Secondary | ICD-10-CM

## 2012-03-11 DIAGNOSIS — F329 Major depressive disorder, single episode, unspecified: Secondary | ICD-10-CM

## 2012-03-11 DIAGNOSIS — Z01419 Encounter for gynecological examination (general) (routine) without abnormal findings: Secondary | ICD-10-CM | POA: Insufficient documentation

## 2012-03-11 DIAGNOSIS — Z1231 Encounter for screening mammogram for malignant neoplasm of breast: Secondary | ICD-10-CM

## 2012-03-11 DIAGNOSIS — Z0000 Encounter for general adult medical examination without abnormal findings: Secondary | ICD-10-CM

## 2012-03-11 DIAGNOSIS — F32A Depression, unspecified: Secondary | ICD-10-CM

## 2012-03-11 NOTE — Progress Notes (Signed)
Subjective:    Patient ID: [name], female    DOB: Nov 28, 1960, 51 y.o.   MRN: [id number]  HPI  51 yo here for CPX.  G2P2- last pap smear was 02/2011- neg but transformation zone absent. S/p conization for cervical dysplasia in 1990.  Denies any vaginal complaints.    Fatigue- ongoing issue for years. On Armour 60 mg daily for hypothyroidism.  Denies any symptoms of hypo or hyperthyroidism.  Lab Results  Component Value Date   TSH 1.69 03/06/2012    Lab Results  Component Value Date   CHOL 188 03/06/2012   HDL 70.30 03/06/2012   LDLCALC 101* 03/06/2012   TRIG 82.0 03/06/2012   CHOLHDL 3 03/06/2012   Migraines- just restarted Topamax 1 month ago. Has had a difficult year with her migraines.  Sees Dr. Neale Burly. Had to restart Topamax eventhough she has side effects- sleepiness, diarrhea.  Other meds not working.  Having several migraines per month. Dr. Neale Burly wants to try Botox next.    Patient Active Problem List  Diagnosis  . Fatigue  . Laryngitis  . Allergic rhinitis  . Depression  . Hypothyroidism  . Migraine  . Routine general medical examination at a health care facility   Past Medical History  Diagnosis Date  . Hip fracture, left 2005    s/p ORIF  . Hypothyroidism   . Fibromyalgia   . Depression   . Allergic rhinitis   . Chronic sinusitis   . Cervical dysplasia 1990    s/p conization   Past Surgical History  Procedure Date  . Orif hip fracture 2005    Daldorf  . Breast enhancement surgery 2007  . Tonsillectomy and adenoidectomy    History  Substance Use Topics  . Smoking status: Never Smoker   . Smokeless tobacco: Not on file  . Alcohol Use: Not on file   Family History  Problem Relation Age of Onset  . Arthritis Mother     RA and OA  . Cancer Maternal Aunt 48    breast CA   No Known Allergies Current Outpatient Prescriptions on File Prior to Visit  Medication Sig Dispense Refill  . baclofen (LIORESAL) 10 MG tablet       .  Cholecalciferol (VITAMIN D3) 2000 UNITS TABS Take 1 tablet by mouth daily.        . fexofenadine (ALLEGRA) 180 MG tablet Take 180 mg by mouth daily.        . hydrOXYzine (ATARAX/VISTARIL) 50 MG tablet Take 1 tablet (50 mg total) by mouth every 6 (six) hours as needed.  60 tablet  0  . levonorgestrel-ethinyl estradiol (SEASONALE) 0.15-0.03 MG tablet Take 1 tablet by mouth daily.  1 Package  4  . magnesium oxide (MAG-OX) 400 MG tablet Take 400 mg by mouth daily.        . Multiple Vitamin (MULTIVITAMIN) capsule Take 1 capsule by mouth daily.        Marland Kitchen pyridOXINE (VITAMIN B-6) 100 MG tablet Take 100 mg by mouth daily.        . SUMAtriptan (IMITREX) 50 MG tablet Take 1 tablet (50 mg total) by mouth as needed.  10 tablet  1  . thyroid (ARMOUR) 60 MG tablet Take 1 tablet (60 mg total) by mouth daily.  90 tablet  3  . topiramate (TOPAMAX) 25 MG tablet Take 2 by mouth daily      . traMADol (ULTRAM) 50 MG tablet Take 1 tablet (50 mg total) by mouth every  6 (six) hours as needed for pain.  20 tablet  0  . Zinc 25 MG TABS Take 1 tablet by mouth daily.        Marland Kitchen zolpidem (AMBIEN) 5 MG tablet Take 1 tablet (5 mg total) by mouth at bedtime.  30 tablet  0  . omeprazole (PRILOSEC) 40 MG capsule I tab po twice daily   60 capsule  12   The PMH, PSH, Social History, Family History, Medications, and allergies have been reviewed in [facility], and have been updated if relevant.   Review of Systems See HPI Patient reports no  vision/ hearing changes,anorexia, weight change, fever ,adenopathy, , swallowing issues, chest pain, edema, hemoptysis, dyspnea(rest, exertional, paroxysmal nocturnal), gastrointestinal  bleeding (melena, rectal bleeding), abdominal pain, , GU symptoms(dysuria, hematuria, pyuria, voiding/incontinence  Issues) syncope, focal weakness, severe memory loss, concerning skin lesions,  abnormal bruising/bleeding, major joint swelling, breast masses or abnormal vaginal bleeding.       Objective:   Physical  Exam BP 98/66  Pulse 76  Temp 98.3 F (36.8 C)  Ht 5' 9.75" (1.772 m)  Wt 172 lb (78.019 kg)  BMI 24.86 kg/m2  General:  Well-developed,well-nourished,in no acute distress; alert,appropriate and cooperative throughout examination Head:  normocephalic and atraumatic.   Eyes:  vision grossly intact, pupils equal, pupils round, and pupils reactive to light.   Ears:  R ear normal and L ear normal.   Nose:  no external deformity.   Mouth:  good dentition.   Neck:  No deformities, masses, or tenderness noted. Breasts:  No mass, nodules, thickening, tenderness, bulging, retraction, inflamation, nipple discharge or skin changes noted, + breast implants   Lungs:  Normal respiratory effort, chest expands symmetrically. Lungs are clear to auscultation, no crackles or wheezes. Heart:  Normal rate and regular rhythm. S1 and S2 normal without gallop, murmur, click, rub or other extra sounds. Abdomen:  Bowel sounds positive,abdomen soft and non-tender without masses, organomegaly or hernias noted. Rectal:  no external abnormalities.   Genitalia:  Pelvic Exam:        External: normal female genitalia without lesions or masses        Vagina: normal without lesions or masses        Cervix: normal without lesions or masses        Adnexa: normal bimanual exam without masses or fullness        Uterus: normal by palpation        Pap smear: performed Msk:  No deformity or scoliosis noted of thoracic or lumbar spine.   Extremities:  No clubbing, cyanosis, edema, or deformity noted with normal full range of motion of all joints.   Neurologic:  alert & oriented X3 and gait normal.   Skin:  Intact without suspicious lesions or rashes Cervical Nodes:  No lymphadenopathy noted Axillary Nodes:  No palpable lymphadenopathy Psych:  Cognition and judgment appear intact. Alert and cooperative with normal attention span and concentration. No apparent delusions, illusions, hallucinations        Assessment &  Plan:   1. Hypothyroidism  Stable on current dose of Armour.   2. Migraines- difficult to control Hopefully she will be approved for botox through her neurologist soon.   3. Routine general medical examination at a health care facility  Reviewed preventive care protocols, scheduled due services, and updated immunizations Discussed nutrition, exercise, diet, and healthy lifestyle. Repeat Pap today. Cytology - PAP  4. Other screening mammogram  [facility] Digital Screening

## 2012-03-11 NOTE — Patient Instructions (Addendum)
Great to see you. Please stop by to see Shirlee Limerick on your way out to set up your mammogram.  Health Maintenance, Females A healthy lifestyle and preventative care can promote health and wellness.  Maintain regular health, dental, and eye exams.   Eat a healthy diet. Foods like vegetables, fruits, whole grains, low-fat dairy products, and lean protein foods contain the nutrients you need without too many calories. Decrease your intake of foods high in solid fats, added sugars, and salt. Get information about a proper diet from your caregiver, if necessary.   Regular physical exercise is one of the most important things you can do for your health. Most adults should get at least 150 minutes of moderate-intensity exercise (any activity that increases your heart rate and causes you to sweat) each week. In addition, most adults need muscle-strengthening exercises on 2 or more days a week.    Maintain a healthy weight. The body mass index (BMI) is a screening tool to identify possible weight problems. It provides an estimate of body fat based on height and weight. Your caregiver can help determine your BMI, and can help you achieve or maintain a healthy weight. For adults 20 years and older:   A BMI below 18.5 is considered underweight.   A BMI of 18.5 to 24.9 is normal.   A BMI of 25 to 29.9 is considered overweight.   A BMI of 30 and above is considered obese.   Maintain normal blood lipids and cholesterol by exercising and minimizing your intake of saturated fat. Eat a balanced diet with plenty of fruits and vegetables. Blood tests for lipids and cholesterol should begin at age 64 and be repeated every 5 years. If your lipid or cholesterol levels are high, you are over 50, or you are a high risk for heart disease, you may need your cholesterol levels checked more frequently.Ongoing high lipid and cholesterol levels should be treated with medicines if diet and exercise are not effective.   If you  smoke, find out from your caregiver how to quit. If you do not use tobacco, do not start.   If you are pregnant, do not drink alcohol. If you are breastfeeding, be very cautious about drinking alcohol. If you are not pregnant and choose to drink alcohol, do not exceed 1 drink per day. One drink is considered to be 12 ounces (355 mL) of beer, 5 ounces (148 mL) of wine, or 1.5 ounces (44 mL) of liquor.   Avoid use of street drugs. Do not share needles with anyone. Ask for help if you need support or instructions about stopping the use of drugs.   High blood pressure causes heart disease and increases the risk of stroke. Blood pressure should be checked at least every 1 to 2 years. Ongoing high blood pressure should be treated with medicines, if weight loss and exercise are not effective.   If you are 26 to 49 years old, ask your caregiver if you should take aspirin to prevent strokes.   Diabetes screening involves taking a blood sample to check your fasting blood sugar level. This should be done once every 3 years, after age 33, if you are within normal weight and without risk factors for diabetes. Testing should be considered at a younger age or be carried out more frequently if you are overweight and have at least 1 risk factor for diabetes.   Breast cancer screening is essential preventative care for women. You should practice "breast self-awareness." This means  understanding the normal appearance and feel of your breasts and may include breast self-examination. Any changes detected, no matter how small, should be reported to a caregiver. Women in their 40s and 30s should have a clinical breast exam (CBE) by a caregiver as part of a regular health exam every 1 to 3 years. After age 75, women should have a CBE every year. Starting at age 58, women should consider having a mammogram (breast X-ray) every year. Women who have a family history of breast cancer should talk to their caregiver about genetic  screening. Women at a high risk of breast cancer should talk to their caregiver about having an MRI and a mammogram every year.   The Pap test is a screening test for cervical cancer. Women should have a Pap test starting at age 71. Between ages 62 and 73, Pap tests should be repeated every 2 years. Beginning at age 20, you should have a Pap test every 3 years as long as the past 3 Pap tests have been normal. If you had a hysterectomy for a problem that was not cancer or a condition that could lead to cancer, then you no longer need Pap tests. If you are between ages 44 and 4, and you have had normal Pap tests going back 10 years, you no longer need Pap tests. If you have had past treatment for cervical cancer or a condition that could lead to cancer, you need Pap tests and screening for cancer for at least 20 years after your treatment. If Pap tests have been discontinued, risk factors (such as a new sexual partner) need to be reassessed to determine if screening should be resumed. Some women have medical problems that increase the chance of getting cervical cancer. In these cases, your caregiver may recommend more frequent screening and Pap tests.   The human papillomavirus (HPV) test is an additional test that may be used for cervical cancer screening. The HPV test looks for the virus that can cause the cell changes on the cervix. The cells collected during the Pap test can be tested for HPV. The HPV test could be used to screen women aged 32 years and older, and should be used in women of any age who have unclear Pap test results. After the age of 35, women should have HPV testing at the same frequency as a Pap test.   Colorectal cancer can be detected and often prevented. Most routine colorectal cancer screening begins at the age of 21 and continues through age 13. However, your caregiver may recommend screening at an earlier age if you have risk factors for colon cancer. On a yearly basis, your  caregiver may provide home test kits to check for hidden blood in the stool. Use of a small camera at the end of a tube, to directly examine the colon (sigmoidoscopy or colonoscopy), can detect the earliest forms of colorectal cancer. Talk to your caregiver about this at age 76, when routine screening begins. Direct examination of the colon should be repeated every 5 to 10 years through age 61, unless early forms of pre-cancerous polyps or small growths are found.   Practice safe sex. Use condoms and avoid high-risk sexual practices to reduce the spread of sexually transmitted infections (STIs). Sexually active women aged 63 and younger should be checked for Chlamydia, which is a common sexually transmitted infection. Older women with new or multiple partners should also be tested for Chlamydia. Testing for other STIs is recommended if you  are sexually active and at increased risk.   Osteoporosis is a disease in which the bones lose minerals and strength with aging. This can result in serious bone fractures. The risk of osteoporosis can be identified using a bone density scan. Women ages 61 and over and women at risk for fractures or osteoporosis should discuss screening with their caregivers. Ask your caregiver whether you should be taking a calcium supplement or vitamin D to reduce the rate of osteoporosis.   Menopause can be associated with physical symptoms and risks. Hormone replacement therapy is available to decrease symptoms and risks. You should talk to your caregiver about whether hormone replacement therapy is right for you.   Use sunscreen with a sun protection factor (SPF) of 30 or greater. Apply sunscreen liberally and repeatedly throughout the day. You should seek shade when your shadow is shorter than you. Protect yourself by wearing long sleeves, pants, a wide-brimmed hat, and sunglasses year round, whenever you are outdoors.   Notify your caregiver of new moles or changes in moles,  especially if there is a change in shape or color. Also notify your caregiver if a mole is larger than the size of a pencil eraser.   Stay current with your immunizations.  Document Released: 02/25/2011 Document Revised: 08/01/2011 Document Reviewed: 02/25/2011 [facility] Patient Information 2012 [place], [place].

## 2012-03-16 ENCOUNTER — Encounter: Payer: Self-pay | Admitting: *Deleted

## 2012-03-16 ENCOUNTER — Encounter: Payer: Self-pay | Admitting: Family Medicine

## 2012-03-16 LAB — HM PAP SMEAR: HM Pap smear: NORMAL

## 2012-04-16 ENCOUNTER — Telehealth: Payer: Self-pay

## 2012-04-16 ENCOUNTER — Encounter: Payer: Self-pay | Admitting: Family Medicine

## 2012-04-16 ENCOUNTER — Ambulatory Visit (INDEPENDENT_AMBULATORY_CARE_PROVIDER_SITE_OTHER): Payer: BC Managed Care – PPO | Admitting: Family Medicine

## 2012-04-16 VITALS — BP 118/80 | HR 84 | Temp 97.4°F | Wt 171.0 lb

## 2012-04-16 DIAGNOSIS — J329 Chronic sinusitis, unspecified: Secondary | ICD-10-CM

## 2012-04-16 MED ORDER — METHYLPREDNISOLONE ACETATE 40 MG/ML IJ SUSP
40.0000 mg | Freq: Once | INTRAMUSCULAR | Status: AC
Start: 2012-04-16 — End: 2012-04-16
  Administered 2012-04-16: 80 mg via INTRAMUSCULAR

## 2012-04-16 MED ORDER — AMOXICILLIN-POT CLAVULANATE 875-125 MG PO TABS: 1.0000 | ORAL_TABLET | Freq: Two times a day (BID) | ORAL | Status: AC

## 2012-04-16 NOTE — Progress Notes (Signed)
Nature conservation officer at [facility] [address] [place] [address] Phone: [phone] Fax: [phone]  Date:  04/16/2012   Name:  [name]   DOB:  Aug 24, 1961   MRN:  [id number] Gender: female  Age: 51 y.o.  PCP:  Ruthe Mannan, MD    Chief Complaint: Sinus drainage, pain and pressure around cheeks, teeth hurti   History of Present Illness:  [name] is a 51 y.o. very pleasant female patient who presents with the following:  Facial pain, teeth pain. Sinus pain, bad drainage. Throat is really raw and havkig up a lot. Seeing a migraine specialist. Has not had a sinus infection. 2 weeks.   2 weeks of symptoms that are quite significant. She is also having some postnasal drip and a very sore throat. She is coughing quite a bit. She also is having severe problems with migraines recently and has been seeing a neurologist. She is taking an occasional ibuprofen or Tylenol since she has been sick, but she has not wanted to take much by way of over-the-counter medication.  She occasionally will have a sinus infection has one or 2 a year.  Past Medical History, Surgical History, Social History, Family History, Problem List, Medications, and Allergies have been reviewed and updated if relevant.  Current Outpatient Prescriptions on File Prior to Visit  Medication Sig Dispense Refill  . baclofen (LIORESAL) 10 MG tablet       . Cholecalciferol (VITAMIN D3) 2000 UNITS TABS Take 1 tablet by mouth daily.        . fexofenadine (ALLEGRA) 180 MG tablet Take 180 mg by mouth daily.        . hydrOXYzine (ATARAX/VISTARIL) 50 MG tablet Take 1 tablet (50 mg total) by mouth every 6 (six) hours as needed.  60 tablet  0  . levonorgestrel-ethinyl estradiol (SEASONALE) 0.15-0.03 MG tablet Take 1 tablet by mouth daily.  1 Package  4  . magnesium oxide (MAG-OX) 400 MG tablet Take 400 mg by mouth daily.        . Multiple Vitamin (MULTIVITAMIN) capsule Take 1 capsule by mouth daily.        Marland Kitchen  omeprazole (PRILOSEC) 40 MG capsule I tab po twice daily   60 capsule  12  . pyridOXINE (VITAMIN B-6) 100 MG tablet Take 100 mg by mouth daily.        . SUMAtriptan (IMITREX) 50 MG tablet Take 1 tablet (50 mg total) by mouth as needed.  10 tablet  1  . thyroid (ARMOUR) 60 MG tablet Take 1 tablet (60 mg total) by mouth daily.  90 tablet  3  . topiramate (TOPAMAX) 25 MG tablet Take 2 by mouth daily      . traMADol (ULTRAM) 50 MG tablet Take 1 tablet (50 mg total) by mouth every 6 (six) hours as needed for pain.  20 tablet  0  . Zinc 25 MG TABS Take 1 tablet by mouth daily.        Marland Kitchen zolpidem (AMBIEN) 5 MG tablet Take 1 tablet (5 mg total) by mouth at bedtime.  30 tablet  0    Review of Systems: ROS: GEN: Acute illness details above GI: Tolerating PO intake GU: maintaining adequate hydration and urination Pulm: No SOB Interactive and getting along well at home.  Otherwise, ROS is as per the HPI.   Physical Examination: Filed Vitals:   04/16/12 1122  BP: 118/80  Pulse: 84  Temp: 97.4 F (36.3 C)  Filed Vitals:   04/16/12 1122  Weight: 171 lb (77.565 kg)   There is no height on file to calculate BMI. Ideal Body Weight:     Gen: WDWN, NAD; alert,appropriate and cooperative throughout exam  HEENT: Normocephalic and atraumatic. Throat clear, w/o exudate, no LAD, R TM clear, L TM - good landmarks, No fluid present. rhinnorhea.  Left frontal and maxillary sinuses: Tender, max Right frontal and maxillary sinuses: Tender, max  Neck: No ant or post LAD CV: RRR, No M/G/R Pulm: Breathing comfortably in no resp distress. no w/c/r Abd: S,NT,ND,+BS Extr: no c/c/e Psych: full affect, pleasant   Assessment and Plan:  1. Sinusitis  methylPREDNISolone acetate (DEPO-MEDROL) injection 40 mg   Very significant maxillary sinusitis. We will treat with Augmentin.  Patient is quite significant today in her facial pain, and we will give her some steroids. She needs to be able to be active  and interactive as soon as possible, and her mother is sick and in the hospital.  Orders Today:  No orders of the defined types were placed in this encounter.    Medications Today: (Includes new updates added during medication reconciliation) Meds ordered this encounter  Medications  . amoxicillin-clavulanate (AUGMENTIN) 875-125 MG per tablet    Sig: Take 1 tablet by mouth 2 (two) times daily.    Dispense:  20 tablet    Refill:  0  . methylPREDNISolone acetate (DEPO-MEDROL) injection 40 mg    Sig:     Medications Discontinued: There are no discontinued medications.   Hannah Beat, MD

## 2012-04-16 NOTE — Telephone Encounter (Signed)
Pt spoke with CAN last night after 5 pm; pt advised to call office this morning. ? Low grade fever,H/A, head congestion,S/T,drainage at back of throat,productive cough with beige phlegm and facial pain. Pt request antibiotic. Pt scheduled with Dr Patsy Lager today at 11:15 am.

## 2012-04-21 ENCOUNTER — Ambulatory Visit
Admission: RE | Admit: 2012-04-21 | Discharge: 2012-04-21 | Disposition: A | Discharge status: Home / Self Care | Payer: BC Managed Care – PPO | Source: Ambulatory Visit | Attending: Family Medicine | Admitting: Family Medicine

## 2012-04-21 DIAGNOSIS — Z1231 Encounter for screening mammogram for malignant neoplasm of breast: Secondary | ICD-10-CM

## 2012-04-22 ENCOUNTER — Other Ambulatory Visit: Payer: Self-pay | Admitting: *Deleted

## 2012-04-22 ENCOUNTER — Encounter: Payer: Self-pay | Admitting: *Deleted

## 2012-04-22 MED ORDER — THYROID 65 MG PO TABS: 65.0000 mg | ORAL_TABLET | Freq: Every day | ORAL | Status: DC

## 2012-06-16 ENCOUNTER — Other Ambulatory Visit: Payer: Self-pay | Admitting: *Deleted

## 2012-06-16 MED ORDER — SUMATRIPTAN SUCCINATE 50 MG PO TABS: 50.0000 mg | ORAL_TABLET | ORAL | Status: DC | PRN

## 2012-06-30 ENCOUNTER — Ambulatory Visit (INDEPENDENT_AMBULATORY_CARE_PROVIDER_SITE_OTHER): Payer: BC Managed Care – PPO | Admitting: Family Medicine

## 2012-06-30 ENCOUNTER — Encounter: Payer: Self-pay | Admitting: Family Medicine

## 2012-06-30 VITALS — BP 102/70 | Temp 97.6°F | Wt 171.0 lb

## 2012-06-30 DIAGNOSIS — G4700 Insomnia, unspecified: Secondary | ICD-10-CM

## 2012-06-30 MED ORDER — ESTAZOLAM 1 MG PO TABS: 1.0000 mg | ORAL_TABLET | Freq: Every day | ORAL | Status: DC

## 2012-06-30 NOTE — Patient Instructions (Addendum)
Good to see you. We are starting Prosom- please do NOT take with Ambien.  Please call me with week with an update.

## 2012-06-30 NOTE — Progress Notes (Signed)
Subjective:    Patient ID: [name], female    DOB: [birth year], 51 y.o.   MRN: [id number]  HPI  51 yo migraines, hypothyroidisim and depression here to discuss insomnia.  Migraines- have actually improved.  Sees Dr. Neale Burly.  On Topamax 100 mg, receiving botox injections (first injection 05/27/2012).    Insomnia- started approximately 1 week ago.  Called Dr. Neale Burly to ask if it was due to botox and he said that it was not. Having difficulty falling and staying asleep.  Has rx for Ambien- tries not to take it because neurologist told her it can trigger her migraines. Denies mind racing or anxiety.  No focal neurological symptoms.  Does eventually fall asleep but wakes up 4-5 hours later.  Hypothyroidism- on armour.  Denies any symptoms of hypo or hyperthyroidism. Lab Results  Component Value Date   TSH 1.69 03/06/2012     Patient Active Problem List  Diagnosis  . Fatigue  . Laryngitis  . Allergic rhinitis  . Depression  . Hypothyroidism  . Migraine  . Routine general medical examination at a health care facility  . Insomnia   Past Medical History  Diagnosis Date  . Hip fracture, left 2005    s/p ORIF  . Hypothyroidism   . Fibromyalgia   . Depression   . Allergic rhinitis   . Chronic sinusitis   . Cervical dysplasia 1990    s/p conization   Past Surgical History  Procedure Date  . Orif hip fracture 2005    Daldorf  . Breast enhancement surgery 2007  . Tonsillectomy and adenoidectomy    History  Substance Use Topics  . Smoking status: Never Smoker   . Smokeless tobacco: Not on file  . Alcohol Use: Not on file   Family History  Problem Relation Age of Onset  . Arthritis Mother     RA and OA  . Cancer Maternal Aunt 48    breast CA   No Known Allergies Current Outpatient Prescriptions on File Prior to Visit  Medication Sig Dispense Refill  . baclofen (LIORESAL) 10 MG tablet       . Cholecalciferol (VITAMIN D3) 2000 UNITS TABS Take 1 tablet by  mouth daily.        . fexofenadine (ALLEGRA) 180 MG tablet Take 180 mg by mouth daily.        . hydrOXYzine (ATARAX/VISTARIL) 50 MG tablet Take 1 tablet (50 mg total) by mouth every 6 (six) hours as needed.  60 tablet  0  . levonorgestrel-ethinyl estradiol (SEASONALE) 0.15-0.03 MG tablet Take 1 tablet by mouth daily.  1 Package  4  . magnesium oxide (MAG-OX) 400 MG tablet Take 400 mg by mouth daily.        . Multiple Vitamin (MULTIVITAMIN) capsule Take 1 capsule by mouth daily.        Marland Kitchen omeprazole (PRILOSEC) 40 MG capsule I tab po twice daily   60 capsule  12  . pyridOXINE (VITAMIN B-6) 100 MG tablet Take 100 mg by mouth daily.        . SUMAtriptan (IMITREX) 50 MG tablet Take 1 tablet (50 mg total) by mouth as needed.  10 tablet  6  . thyroid (ARMOUR) 65 MG tablet Take 65 mg by mouth daily.      Marland Kitchen thyroid (NATURE-THROID) 65 MG tablet Take 1 tablet (65 mg total) by mouth daily.  90 tablet  3  . topiramate (TOPAMAX) 25 MG tablet Take 2 by mouth daily      .  Zinc 25 MG TABS Take 1 tablet by mouth daily.        Marland Kitchen zolpidem (AMBIEN) 5 MG tablet Take 1 tablet (5 mg total) by mouth at bedtime.  30 tablet  0   The PMH, PSH, Social History, Family History, Medications, and allergies have been reviewed in [facility], and have been updated if relevant.   Review of Systems See HPI      Objective:   Physical Exam BP 102/70  Temp 97.6 F (36.4 C)  Wt 171 lb (77.565 kg)  General:  Well-developed,well-nourished,in no acute distress; alert,appropriate and cooperative throughout examination Neurologic:  alert & oriented X3 and gait normal.   Skin:  Intact without suspicious lesions or rashes Psych:  Cognition and judgment appear intact. Alert and cooperative with normal attention span and concentration. No apparent delusions, illusions, hallucinations      Assessment & Plan:   1. Insomnia    New.   >25 min spent with face to face with patient, >50% counseling and/or coordinating care Discussed  possibility of triggering migraines and sedation/addiction potential with all medications used to treat insomnia.  She does have good sleep hygeine.  I question if this was from botox injections.  Given that she cannot stay asleep, will try longer acting, benzo.  Rx for prosom given.  She will call me later this week with an update.  The patient indicates understanding of these issues and agrees with the plan.

## 2012-07-06 ENCOUNTER — Telehealth: Payer: Self-pay

## 2012-07-06 MED ORDER — CLONAZEPAM 1 MG PO TABS: 1.0000 mg | ORAL_TABLET | Freq: Every evening | ORAL | Status: DC | PRN

## 2012-07-06 NOTE — Telephone Encounter (Signed)
Rx called to pharmacy as instructed. Patient notified by telephone. 

## 2012-07-06 NOTE — Telephone Encounter (Signed)
Noted.  Prosom d/c'd.  Please call in rx for klonopin as entered below.

## 2012-07-06 NOTE — Telephone Encounter (Signed)
Pt seen earlier and started on Prosom. Pt got some relief for a few days and was able to sleep; now pt taking Prosom and still struggling to stay asleep;last night woke up every hour. Pt took Prosom at hs and after waking up 2x in 2 hours took 1/2 tab of Prosom; still did not go to sleep and 2 hours later took another 1/2 of prosom and still did not go to sleep. Pt request different med  Or change in dosage. [facility].Please advise.

## 2012-07-13 ENCOUNTER — Telehealth: Payer: Self-pay

## 2012-07-13 NOTE — Telephone Encounter (Signed)
Pt left v/m that Clonazepam is not effective in helping pt sleep; pt request change in dosage or trying a different med.[facility].Please advise.

## 2012-07-14 NOTE — Telephone Encounter (Signed)
Please her to try two tablets (2 mg total) of her klonopin next several nights and call us with an update. We are truly running out of options unless we go with a long acting (controlled release) ambien.

## 2012-07-14 NOTE — Telephone Encounter (Signed)
Advised patient.  She asks if ok to take one when she goes to bed and one if she wakes up during the night, or both at the same time.  Advised whichever way works best for her.  Please advise.

## 2012-07-27 MED ORDER — CLONAZEPAM 1 MG PO TABS: ORAL_TABLET | ORAL | Status: DC

## 2012-07-27 NOTE — Telephone Encounter (Signed)
Ok to send in rx for one month- dosage pt requested.

## 2012-07-27 NOTE — Telephone Encounter (Signed)
Pt request refill Clonazepam. Pt said taking the 2 tabs(2 mg total) is working better. Pt request new instructions to [facility].Please advise.

## 2012-07-27 NOTE — Telephone Encounter (Signed)
Script sent to pharmacy.

## 2012-08-26 HISTORY — PX: COLONOSCOPY: SHX174

## 2012-09-10 ENCOUNTER — Other Ambulatory Visit: Payer: Self-pay | Admitting: *Deleted

## 2012-09-10 MED ORDER — CLONAZEPAM 1 MG PO TABS: ORAL_TABLET | ORAL | Status: DC

## 2012-09-10 NOTE — Telephone Encounter (Signed)
Medicine called to [facility].

## 2012-10-20 ENCOUNTER — Other Ambulatory Visit: Payer: Self-pay | Admitting: *Deleted

## 2012-10-20 MED ORDER — CLONAZEPAM 1 MG PO TABS: ORAL_TABLET | ORAL | Status: DC

## 2012-10-20 NOTE — Telephone Encounter (Signed)
Last filled 09/10/12

## 2012-10-21 NOTE — Telephone Encounter (Signed)
Pt request status of clonazepam refill. Medication phoned to [facility] pharmacy as instructed spoke with Toniann Fail. Pt notified done.

## 2012-11-16 ENCOUNTER — Ambulatory Visit (INDEPENDENT_AMBULATORY_CARE_PROVIDER_SITE_OTHER): Payer: BC Managed Care – PPO | Admitting: Family Medicine

## 2012-11-16 ENCOUNTER — Encounter: Payer: Self-pay | Admitting: Family Medicine

## 2012-11-16 VITALS — BP 124/82 | HR 76 | Temp 97.8°F | Wt 156.2 lb

## 2012-11-16 DIAGNOSIS — H6983 Other specified disorders of Eustachian tube, bilateral: Secondary | ICD-10-CM

## 2012-11-16 DIAGNOSIS — H6980 Other specified disorders of Eustachian tube, unspecified ear: Secondary | ICD-10-CM | POA: Insufficient documentation

## 2012-11-16 DIAGNOSIS — H6990 Unspecified Eustachian tube disorder, unspecified ear: Secondary | ICD-10-CM | POA: Insufficient documentation

## 2012-11-16 DIAGNOSIS — J019 Acute sinusitis, unspecified: Secondary | ICD-10-CM | POA: Insufficient documentation

## 2012-11-16 MED ORDER — AMOXICILLIN-POT CLAVULANATE 875-125 MG PO TABS: 1.0000 | ORAL_TABLET | Freq: Two times a day (BID) | ORAL | Status: DC

## 2012-11-16 MED ORDER — DEXAMETHASONE SODIUM PHOSPHATE 10 MG/ML IJ SOLN
10.0000 mg | Freq: Once | INTRAMUSCULAR | Status: AC
  Administered 2012-11-16: 10 mg via INTRAMUSCULAR

## 2012-11-16 MED ORDER — FLUTICASONE PROPIONATE 50 MCG/ACT NA SUSP: 2.0000 | Freq: Every day | NASAL | Status: DC

## 2012-11-16 NOTE — Patient Instructions (Addendum)
You have a sinus infection. Take medicine as prescribed: augmentin for 10 days. Steroid shot today. Push fluids and plenty of rest. Nasal saline irrigation or neti pot to help drain sinuses. May use simple mucinex with plenty of fluid to help mobilize mucous. Let us know if fever >101.5, trouble opening/closing mouth, difficulty swallowing, or worsening - you may need to be seen again.

## 2012-11-16 NOTE — Progress Notes (Signed)
  Subjective:    Patient ID: [name], female    DOB: May 20, 1961, 52 y.o.   MRN: [id number]  HPI CC: /sinus infectoin  3 wk h/o sinus congestion.  Bad drainage, thick dark mucous, pressure behind both eyes.  Initially with fever but no longer.  + sinus pressure headache.  + ST.  + coughing from drainage.  Has tried decongestant, mucinex, saline nasal spray, neti pot.  No fevers/chills, abd pain, nausea, tooth pain.  No smokers at home. No sick contacts at home. + h/o allergic rhinitis.  H/o chronic migraine. Requests refill of flonase. Ears very muffled - which precedes today's sinus infection.  Going on for last 3-6 months.  Trouble flying long distance 2/2 pressure building up in ears.  Requests steroid shot today as this has helped in past.  Past Medical History  Diagnosis Date  . Hip fracture, left 2005    s/p ORIF  . Hypothyroidism   . Fibromyalgia   . Depression   . Allergic rhinitis   . Chronic sinusitis   . Cervical dysplasia 1990    s/p conization     Review of Systems Per HPI    Objective:   Physical Exam  Nursing note and vitals reviewed. Constitutional: She appears well-developed and well-nourished. No distress.  HENT:  Head: Normocephalic and atraumatic.  Right Ear: Hearing, tympanic membrane, external ear and ear canal normal.  Left Ear: Hearing, tympanic membrane, external ear and ear canal normal.  Nose: Mucosal edema present. No rhinorrhea. Right sinus exhibits maxillary sinus tenderness and frontal sinus tenderness. Left sinus exhibits maxillary sinus tenderness and frontal sinus tenderness.  Mouth/Throat: Uvula is midline, oropharynx is clear and moist and mucous membranes are normal. No oropharyngeal exudate, posterior oropharyngeal edema, posterior oropharyngeal erythema or tonsillar abscesses.  Eyes: Conjunctivae and EOM are normal. Pupils are equal, round, and reactive to light. No scleral icterus.  Neck: Normal range of motion. Neck  supple.  Cardiovascular: Normal rate, regular rhythm, normal heart sounds and intact distal pulses.   No murmur heard. Pulmonary/Chest: Effort normal and breath sounds normal. No respiratory distress. She has no wheezes. She has no rales.  Lymphadenopathy:    She has no cervical adenopathy.  Skin: Skin is warm and dry. No rash noted.       Assessment & Plan:

## 2012-11-16 NOTE — Addendum Note (Signed)
Addended by: Patience Musca on: 11/16/2012 12:50 PM   Modules accepted: Orders

## 2012-11-16 NOTE — Assessment & Plan Note (Addendum)
Anticipate acute bacterial sinusitis given duration and progression. Treat with Dexamethasone 10mg  IM shot today as pt has responded well to this in past, and given significant congestion and nasal passage inflammation/ETD.  Steroid precautions discussed.. Treat with augmentin course as well. Pt agrees with plan. Continue nasal saline and mucinex.

## 2012-11-16 NOTE — Assessment & Plan Note (Signed)
Bilateral, R>L. Dexamethasone 10mg  IM today, then start flonase in 1 week. May benefit from ENT referral if no better.

## 2012-11-17 ENCOUNTER — Other Ambulatory Visit: Payer: Self-pay | Admitting: *Deleted

## 2012-11-17 MED ORDER — CLONAZEPAM 1 MG PO TABS: ORAL_TABLET | ORAL | Status: DC

## 2012-11-17 NOTE — Telephone Encounter (Signed)
Faxed refill request from [facility], last filled 60 on 10/21/12.

## 2012-11-17 NOTE — Telephone Encounter (Signed)
Medicine called to [facility]. 

## 2012-11-25 ENCOUNTER — Telehealth: Payer: Self-pay | Admitting: Family Medicine

## 2012-11-25 NOTE — Telephone Encounter (Signed)
Patient Information:  Caller Name: [name] [name]  Phone: [phone]  Patient: [name] [name]  Gender: Female  DOB: Feb 21, 1961  Age: 52 Years  PCP: Ruthe Mannan [facility])  Pregnant: No  Office Follow Up:  Does the office need to follow up with this patient?: No  Instructions For The Office: N/A  RN Note:  Pt states she will try the home care advice before scheduling an appt in the office.    Symptoms  Reason For Call & Symptoms: Patient states she is on antibiotic for sinus infection and now having vaginal itching, burning, redness, vaginal dicharge.  Reviewed Health History In EMR: Yes  Reviewed Medications In EMR: Yes  Reviewed Allergies In EMR: Yes  Reviewed Surgeries / Procedures: Yes  Date of Onset of Symptoms: 11/20/2012 OB / GYN:  LMP: 11/04/2012  Guideline(s) Used:  Vaginal Discharge  Disposition Per Guideline:   See Within 3 Days in Office  Reason For Disposition Reached:   Symptoms of a yeast infection" (i.e., itchy, white discharge, not bad smelling) and not improved > 3 days following Care Advice  Advice Given:  Pregnancy Test, When in Doubt:  Follow the instructions included in the package.  Antifungal Medication for Vaginal Yeast Infection:   Available in the [place] States: Femstat-3, miconazole (Monistat-3), clotrimazole (Gyne-Lotrimin-3, Mycelex-7), butoconazole (Femstat-3).  Genital Hygiene:   Keep your genital area clean. Wash daily.  Keep your genital area dry. Wear cotton underwear or underwear with a cotton crotch.  Do not douche.  Do not use feminine hygiene products.  Call Back If:  There is no improvement after treating yourself for a vaginal yeast infection  You become worse.  Patient Will Follow Care Advice:  YES

## 2012-12-21 ENCOUNTER — Other Ambulatory Visit: Payer: Self-pay | Admitting: *Deleted

## 2012-12-21 MED ORDER — CLONAZEPAM 1 MG PO TABS: ORAL_TABLET | ORAL | Status: DC

## 2012-12-21 NOTE — Telephone Encounter (Signed)
Medicine called to pharmacy. 

## 2012-12-21 NOTE — Telephone Encounter (Signed)
Last filled 11/17/12 

## 2013-01-22 ENCOUNTER — Other Ambulatory Visit: Payer: Self-pay | Admitting: *Deleted

## 2013-01-22 MED ORDER — CLONAZEPAM 1 MG PO TABS: ORAL_TABLET | ORAL | Status: DC

## 2013-01-22 NOTE — Telephone Encounter (Signed)
Last filled 12/21/12 

## 2013-01-22 NOTE — Telephone Encounter (Signed)
Medicine called to pharmacy. 

## 2013-03-02 ENCOUNTER — Other Ambulatory Visit: Payer: Self-pay | Admitting: *Deleted

## 2013-03-02 MED ORDER — CLONAZEPAM 1 MG PO TABS: ORAL_TABLET | ORAL | Status: DC

## 2013-03-02 NOTE — Telephone Encounter (Signed)
Refill called to [facility].

## 2013-03-31 ENCOUNTER — Other Ambulatory Visit: Payer: Self-pay | Admitting: *Deleted

## 2013-03-31 MED ORDER — THYROID 65 MG PO TABS: 65.0000 mg | ORAL_TABLET | Freq: Every day | ORAL | Status: DC

## 2013-04-01 ENCOUNTER — Other Ambulatory Visit: Payer: Self-pay

## 2013-04-01 MED ORDER — CLONAZEPAM 1 MG PO TABS: ORAL_TABLET | ORAL | Status: DC

## 2013-04-01 NOTE — Telephone Encounter (Signed)
Pt request refill clonazepam to [facility]; pt said was requested 08/06*/14; advised pt received request for Armour thyroid but not clonazepam. Pt is out of med.Please advise.

## 2013-04-01 NOTE — Telephone Encounter (Signed)
Refill called to [facility], advised patient she is due for physical.

## 2013-04-01 NOTE — Telephone Encounter (Signed)
Notify pt that she is due for CPX. Will  Refill once .Marland Kitchen Needs to schedule appt

## 2013-04-08 ENCOUNTER — Encounter: Payer: Self-pay | Admitting: Radiology

## 2013-04-09 ENCOUNTER — Encounter: Payer: Self-pay | Admitting: Family Medicine

## 2013-04-09 ENCOUNTER — Ambulatory Visit (INDEPENDENT_AMBULATORY_CARE_PROVIDER_SITE_OTHER): Payer: BC Managed Care – PPO | Admitting: Family Medicine

## 2013-04-09 VITALS — BP 102/78 | HR 60 | Temp 98.1°F | Wt 155.0 lb

## 2013-04-09 DIAGNOSIS — G43909 Migraine, unspecified, not intractable, without status migrainosus: Secondary | ICD-10-CM

## 2013-04-09 DIAGNOSIS — F32A Depression, unspecified: Secondary | ICD-10-CM

## 2013-04-09 DIAGNOSIS — G4700 Insomnia, unspecified: Secondary | ICD-10-CM

## 2013-04-09 DIAGNOSIS — E039 Hypothyroidism, unspecified: Secondary | ICD-10-CM

## 2013-04-09 DIAGNOSIS — F329 Major depressive disorder, single episode, unspecified: Secondary | ICD-10-CM

## 2013-04-09 MED ORDER — THYROID 65 MG PO TABS: 65.0000 mg | ORAL_TABLET | Freq: Every day | ORAL | Status: DC

## 2013-04-09 MED ORDER — CLONAZEPAM 1 MG PO TABS: ORAL_TABLET | ORAL | Status: DC

## 2013-04-09 NOTE — Patient Instructions (Addendum)
Good to see you. We will check labs before your physical.  Let's continue tapering by cutting your clonazepam in half as you have been trying.

## 2013-04-09 NOTE — Progress Notes (Signed)
Subjective:    Patient ID: [name], female    DOB: August 31, 1960, 52 y.o.   MRN: [id number]  HPI  52 yo migraines, hypothyroidisim, insomnia, migraines and depression here for "med refills."  Migraines- have actually improved.  Sees Dr. Neale Burly.  On Topamax 100 mg, receiving botox injections (first injection 05/27/2012).    Insomnia- started after she started botox injections.  After second botox injection, did not have insomnia.  After most recent injection, did have a little more insomnia again.  Failed prosom.  ambien only used occasionally. Clonazepam does work but she would like to taper it down, especially when she is not receiving botox.  She has cut back to 1 mg at bedtime.  Hypothyroidism- on armour.  Denies any symptoms of hypo or hyperthyroidism. Lab Results  Component Value Date   TSH 1.69 03/06/2012     Patient Active Problem List   Diagnosis Date Noted  . Insomnia 06/30/2012  . Migraine 05/23/2011  . Depression   . Hypothyroidism    Past Medical History  Diagnosis Date  . Hip fracture, left 2005    s/p ORIF  . Hypothyroidism   . Fibromyalgia   . Depression   . Allergic rhinitis   . Chronic sinusitis   . Cervical dysplasia 1990    s/p conization   Past Surgical History  Procedure Laterality Date  . Orif hip fracture  2005    Daldorf  . Breast enhancement surgery  2007  . Tonsillectomy and adenoidectomy     History  Substance Use Topics  . Smoking status: Never Smoker   . Smokeless tobacco: Not on file  . Alcohol Use: No   Family History  Problem Relation Age of Onset  . Arthritis Mother     RA and OA  . Cancer Maternal Aunt 48    breast CA   No Known Allergies Current Outpatient Prescriptions on File Prior to Visit  Medication Sig Dispense Refill  . baclofen (LIORESAL) 10 MG tablet       . clonazePAM (KLONOPIN) 1 MG tablet Take two tablets by mouth at bedtime.  60 tablet  0  . fluticasone (FLONASE) 50 MCG/ACT nasal spray Place 2 sprays  into the nose daily.  16 g  3  . hydrOXYzine (ATARAX/VISTARIL) 50 MG tablet Take 1 tablet (50 mg total) by mouth every 6 (six) hours as needed.  60 tablet  0  . magnesium oxide (MAG-OX) 400 MG tablet Take 400 mg by mouth daily.        . Multiple Vitamin (MULTIVITAMIN) capsule Take 1 capsule by mouth daily.        . SUMAtriptan (IMITREX) 50 MG tablet Take 1 tablet (50 mg total) by mouth as needed.  10 tablet  6  . thyroid (ARMOUR) 65 MG tablet Take 1 tablet (65 mg total) by mouth daily. *Please schedule appointment in August or September  with Dr, having labs prior* Thanks!  90 tablet  0  . topiramate (TOPAMAX) 25 MG tablet Take 2 by mouth daily      . Zinc 25 MG TABS Take 1 tablet by mouth daily.        Marland Kitchen zolpidem (AMBIEN) 5 MG tablet Take 1 tablet (5 mg total) by mouth at bedtime.  30 tablet  0   No current facility-administered medications on file prior to visit.   The PMH, PSH, Social History, Family History, Medications, and allergies have been reviewed in [facility], and have been updated if relevant.  Review of Systems See HPI      Objective:   Physical Exam BP 102/78  Pulse 60  Temp(Src) 98.1 F (36.7 C) (Oral)  Wt 155 lb (70.308 kg)  BMI 22.39 kg/m2  SpO2 98%  LMP 04/03/2013  General:  Well-developed,well-nourished,in no acute distress; alert,appropriate and cooperative throughout examination Neurologic:  alert & oriented X3 and gait normal.   Skin:  Intact without suspicious lesions or rashes Psych:  Cognition and judgment appear intact. Alert and cooperative with normal attention span and concentration. No apparent delusions, illusions, hallucinations      Assessment & Plan:   1. Insomnia Intermittent. >25 min spent with face to face with patient, >50% counseling and/or coordinating care. Discussed how to taper down the Clonazapem.  She is already trying this at home.    2. Hypothyroidism Rx refilled.  Will check labs prior to her CPX in October.  3.  Depression Stable.  4. Migraine Improved with botox.

## 2013-04-30 ENCOUNTER — Other Ambulatory Visit: Payer: Self-pay

## 2013-04-30 MED ORDER — CLONAZEPAM 1 MG PO TABS: ORAL_TABLET | ORAL | Status: DC

## 2013-04-30 NOTE — Telephone Encounter (Signed)
Refill called to [facility], advised pt.  She says she found the paper script and is already at the pharmacy, wants to pick up script that was called in.  I called the pharmacy back and told them to be sure she gives them the paper script for them to shred before she gets the refill from them.  Pharmacist said he would do that.

## 2013-04-30 NOTE — Telephone Encounter (Signed)
Spoke with pharmacy tech, they did get written script and it has been shredded.

## 2013-04-30 NOTE — Telephone Encounter (Signed)
She did fill it on 8/7 but not since (per state registry) Okay to fill #60 x 0

## 2013-04-30 NOTE — Telephone Encounter (Signed)
Pt said she was seen 04/09/13 and cannot find AVS or the clonazepam prescription Dr Dayton Martes printed. Pt is not sure she was given a prescription. Pt request clonazepam called to [facility].pt request cb.

## 2013-04-30 NOTE — Addendum Note (Signed)
Addended by: Eliezer Bottom on: 04/30/2013 02:41 PM   Modules accepted: Orders

## 2013-05-26 ENCOUNTER — Other Ambulatory Visit: Payer: Self-pay | Admitting: Family Medicine

## 2013-05-26 DIAGNOSIS — Z0000 Encounter for general adult medical examination without abnormal findings: Secondary | ICD-10-CM

## 2013-05-26 DIAGNOSIS — Z136 Encounter for screening for cardiovascular disorders: Secondary | ICD-10-CM

## 2013-05-26 DIAGNOSIS — E039 Hypothyroidism, unspecified: Secondary | ICD-10-CM

## 2013-05-28 ENCOUNTER — Other Ambulatory Visit: Payer: Self-pay | Admitting: Family Medicine

## 2013-05-28 MED ORDER — CLONAZEPAM 1 MG PO TABS: ORAL_TABLET | ORAL | Status: DC

## 2013-05-28 NOTE — Telephone Encounter (Signed)
Phoned in to pharmacy. 

## 2013-05-28 NOTE — Telephone Encounter (Signed)
Last filled 04/30/13 

## 2013-06-01 ENCOUNTER — Encounter: Payer: Self-pay | Admitting: *Deleted

## 2013-06-02 ENCOUNTER — Encounter: Payer: Self-pay | Admitting: Radiology

## 2013-06-02 ENCOUNTER — Other Ambulatory Visit: Payer: BC Managed Care – PPO

## 2013-06-03 ENCOUNTER — Other Ambulatory Visit (INDEPENDENT_AMBULATORY_CARE_PROVIDER_SITE_OTHER): Payer: BC Managed Care – PPO

## 2013-06-03 DIAGNOSIS — E039 Hypothyroidism, unspecified: Secondary | ICD-10-CM

## 2013-06-03 DIAGNOSIS — Z136 Encounter for screening for cardiovascular disorders: Secondary | ICD-10-CM

## 2013-06-03 DIAGNOSIS — Z0000 Encounter for general adult medical examination without abnormal findings: Secondary | ICD-10-CM

## 2013-06-03 LAB — COMPREHENSIVE METABOLIC PANEL
AST: 21 U/L (ref 0–37)
Albumin: 4.1 g/dL (ref 3.5–5.2)
Alkaline Phosphatase: 46 U/L (ref 39–117)
BUN: 22 mg/dL (ref 6–23)
Creatinine, Ser: 0.9 mg/dL (ref 0.4–1.2)
Potassium: 4.1 mEq/L (ref 3.5–5.1)
Sodium: 139 mEq/L (ref 135–145)
Total Bilirubin: 0.8 mg/dL (ref 0.3–1.2)

## 2013-06-03 LAB — LIPID PANEL
Cholesterol: 168 mg/dL (ref 0–200)
LDL Cholesterol: 81 mg/dL (ref 0–99)
Total CHOL/HDL Ratio: 2
Triglycerides: 51 mg/dL (ref 0.0–149.0)
VLDL: 10.2 mg/dL (ref 0.0–40.0)

## 2013-06-03 LAB — TSH: TSH: 2.06 u[IU]/mL (ref 0.35–5.50)

## 2013-06-03 LAB — CBC WITH DIFFERENTIAL/PLATELET
Basophils Relative: 0.8 % (ref 0.0–3.0)
Eosinophils Absolute: 0.2 10*3/uL (ref 0.0–0.7)
Eosinophils Relative: 3.2 % (ref 0.0–5.0)
HCT: 38.5 % (ref 36.0–46.0)
Hemoglobin: 13.3 g/dL (ref 12.0–15.0)
MCHC: 34.4 g/dL (ref 30.0–36.0)
MCV: 91.9 fl (ref 78.0–100.0)
Monocytes Absolute: 0.4 10*3/uL (ref 0.1–1.0)
Neutro Abs: 2.7 10*3/uL (ref 1.4–7.7)
RBC: 4.19 Mil/uL (ref 3.87–5.11)

## 2013-06-10 ENCOUNTER — Ambulatory Visit (INDEPENDENT_AMBULATORY_CARE_PROVIDER_SITE_OTHER): Payer: BC Managed Care – PPO | Admitting: Family Medicine

## 2013-06-10 ENCOUNTER — Encounter: Payer: Self-pay | Admitting: Family Medicine

## 2013-06-10 VITALS — BP 114/78 | HR 69 | Temp 97.6°F | Ht 69.5 in | Wt 153.2 lb

## 2013-06-10 DIAGNOSIS — Z820 Family history of epilepsy and other diseases of the nervous system: Secondary | ICD-10-CM

## 2013-06-10 DIAGNOSIS — F32A Depression, unspecified: Secondary | ICD-10-CM

## 2013-06-10 DIAGNOSIS — Z1211 Encounter for screening for malignant neoplasm of colon: Secondary | ICD-10-CM

## 2013-06-10 DIAGNOSIS — Z0000 Encounter for general adult medical examination without abnormal findings: Secondary | ICD-10-CM

## 2013-06-10 DIAGNOSIS — E039 Hypothyroidism, unspecified: Secondary | ICD-10-CM

## 2013-06-10 DIAGNOSIS — F329 Major depressive disorder, single episode, unspecified: Secondary | ICD-10-CM

## 2013-06-10 DIAGNOSIS — Z1231 Encounter for screening mammogram for malignant neoplasm of breast: Secondary | ICD-10-CM

## 2013-06-10 DIAGNOSIS — G43909 Migraine, unspecified, not intractable, without status migrainosus: Secondary | ICD-10-CM

## 2013-06-10 DIAGNOSIS — G4700 Insomnia, unspecified: Secondary | ICD-10-CM

## 2013-06-10 NOTE — Progress Notes (Signed)
Subjective:    Patient ID: [name], female    DOB: 1960-09-14, 52 y.o.   MRN: [id number]  HPI  52 yo pleasant female here for CPX.  G2P2- last pap smear was 02/2012 - done by moe.  Was normal. S/p conization for cervical dysplasia in 1990.  Denies any vaginal complaints.    LMP 05/30/2013.  Periods are heavier now but still regular.  Never had a colonoscopy.  No changes in bowel habits or blood in her stool.  Due for mammogram.  Hypothyroidism- On Armour 60 mg daily for hypothyroidism.  Denies any symptoms of hypo or hyperthyroidism.  Lab Results  Component Value Date   TSH 2.06 06/03/2013    Lab Results  Component Value Date   CHOL 168 06/03/2013   HDL 76.40 06/03/2013   LDLCALC 81 06/03/2013   TRIG 51.0 06/03/2013   CHOLHDL 2 06/03/2013   Migraines-  improved. Sees Dr. Neale Burly. On Topamax 100 mg, receiving botox injections (first injection 05/27/2012). Botox still working well but tends to wears off around 3-4 weeks before next injection.  Now only getting 14 migraines in 90 day period.  Still using Imitrex for abortive therapy but tries not to take it.  Tries to take Baclofen first for abortive therapy.  Insomnia- started after she started botox injections. After second botox injection, did not have insomnia. Clonazepam does work.  She has cut back to 1 mg at bedtime.  Patient Active Problem List   Diagnosis Date Noted  . Routine general medical examination at a health care facility 06/10/2013  . Insomnia 06/30/2012  . Migraine 05/23/2011  . Depression   . Hypothyroidism    Past Medical History  Diagnosis Date  . Hip fracture, left 2005    s/p ORIF  . Hypothyroidism   . Fibromyalgia   . Depression   . Allergic rhinitis   . Chronic sinusitis   . Cervical dysplasia 1990    s/p conization   Past Surgical History  Procedure Laterality Date  . Orif hip fracture  2005    Daldorf  . Breast enhancement surgery  2007  . Tonsillectomy and adenoidectomy      History  Substance Use Topics  . Smoking status: Never Smoker   . Smokeless tobacco: Not on file  . Alcohol Use: No   Family History  Problem Relation Age of Onset  . Arthritis Mother     RA and OA  . Cancer Maternal Aunt 48    breast CA   No Known Allergies Current Outpatient Prescriptions on File Prior to Visit  Medication Sig Dispense Refill  . baclofen (LIORESAL) 10 MG tablet       . clonazePAM (KLONOPIN) 1 MG tablet Take1 tablet by mouth at bedtime.  60 tablet  0  . fluticasone (FLONASE) 50 MCG/ACT nasal spray Place 2 sprays into the nose daily.  16 g  3  . hydrOXYzine (ATARAX/VISTARIL) 50 MG tablet Take 1 tablet (50 mg total) by mouth every 6 (six) hours as needed.  60 tablet  0  . magnesium oxide (MAG-OX) 400 MG tablet Take 400 mg by mouth daily.        . Multiple Vitamin (MULTIVITAMIN) capsule Take 1 capsule by mouth daily.        . SUMAtriptan (IMITREX) 50 MG tablet Take 1 tablet (50 mg total) by mouth as needed.  10 tablet  6  . thyroid (ARMOUR) 65 MG tablet Take 1 tablet (65 mg total) by mouth daily. *Please  schedule appointment in August or September  with Dr, having labs prior* Thanks!  90 tablet  0  . Zinc 25 MG TABS Take 1 tablet by mouth daily.        Marland Kitchen zolpidem (AMBIEN) 5 MG tablet Take 1 tablet (5 mg total) by mouth at bedtime.  30 tablet  0   No current facility-administered medications on file prior to visit.   The PMH, PSH, Social History, Family History, Medications, and allergies have been reviewed in [facility], and have been updated if relevant.   Review of Systems See HPI Patient reports no  vision/ hearing changes,anorexia, weight change, fever ,adenopathy, , swallowing issues, chest pain, edema, hemoptysis, dyspnea(rest, exertional, paroxysmal nocturnal), gastrointestinal  bleeding (melena, rectal bleeding), abdominal pain, , GU symptoms(dysuria, hematuria, pyuria, voiding/incontinence  Issues) syncope, focal weakness, severe memory loss, concerning skin  lesions,  abnormal bruising/bleeding, major joint swelling, breast masses or abnormal vaginal bleeding.       Objective:   Physical Exam BP 114/78  Pulse 69  Temp(Src) 97.6 F (36.4 C) (Oral)  Ht 5' 9.5" (1.765 m)  Wt 153 lb 4 oz (69.514 kg)  BMI 22.31 kg/m2  SpO2 99%  LMP 05/30/2013 Wt Readings from Last 3 Encounters:  06/10/13 153 lb 4 oz (69.514 kg)  04/09/13 155 lb (70.308 kg)  11/16/12 156 lb 4 oz (70.875 kg)     General:  Well-developed,well-nourished,in no acute distress; alert,appropriate and cooperative throughout examination Head:  normocephalic and atraumatic.   Eyes:  vision grossly intact, pupils equal, pupils round, and pupils reactive to light.   Ears:  R ear normal and L ear normal.   Nose:  no external deformity.   Mouth:  good dentition.   Neck:  No deformities, masses, or tenderness noted. Lungs:  Normal respiratory effort, chest expands symmetrically. Lungs are clear to auscultation, no crackles or wheezes. Heart:  Normal rate and regular rhythm. S1 and S2 normal without gallop, murmur, click, rub or other extra sounds. Abdomen:  Bowel sounds positive,abdomen soft and non-tender without masses, organomegaly or hernias noted. Msk:  No deformity or scoliosis noted of thoracic or lumbar spine.   Extremities:  No clubbing, cyanosis, edema, or deformity noted with normal full range of motion of all joints.   Neurologic:  alert & oriented X3 and gait normal.   Skin:  Intact without suspicious lesions or rashes Cervical Nodes:  No lymphadenopathy noted Axillary Nodes:  No palpable lymphadenopathy Psych:  Cognition and judgment appear intact. Alert and cooperative with normal attention span and concentration. No apparent delusions, illusions, hallucinations    Assessment & Plan:  1. Hypothyroidism Stable on current dose of Armour. No changes.  2. Depression Denies anxiety or depression.  3. Insomnia Still seems to be an issue with botox. Continue current  rx.  4. Migraine Followed by Dr. Neale Burly. HAs are improving with botox.  5. Routine general medical examination at a health care facility Reviewed preventive care protocols, scheduled due services, and updated immunizations Discussed nutrition, exercise, diet, and healthy lifestyle.   6. Other screening mammogram Mammogram ordered - MM Digital Screening; Future  7. Special screening for malignant neoplasms, colon GI referral placed for screening colonoscopy. - Ambulatory referral to Gastroenterology

## 2013-06-10 NOTE — Patient Instructions (Signed)
Check with your insurance to see if they will cover the shingles shot.  We will call you with your colonoscopy appointment.  Please call to set up your mammogram appointment.  Please come back for your flu shot.

## 2013-06-15 ENCOUNTER — Encounter: Payer: Self-pay | Admitting: Internal Medicine

## 2013-06-21 ENCOUNTER — Encounter: Payer: Self-pay | Admitting: Family Medicine

## 2013-06-23 ENCOUNTER — Other Ambulatory Visit: Payer: Self-pay | Admitting: *Deleted

## 2013-06-23 NOTE — Telephone Encounter (Signed)
Last office visit 06/10/2013 with Dr. Dayton Martes.  Ok to refill?

## 2013-06-24 NOTE — Telephone Encounter (Signed)
Ok to refill on or after 11/3

## 2013-06-24 NOTE — Telephone Encounter (Signed)
There are a few things wrong with this: 1- If she had it filled on 10/29 by a provider outside of our practice, then we will not refill it anymore. [place] can't get controlled substance from multiple providers 2- If she had it filled on 10/29, the a refill will not be given until 11/29 3- If she is cutting the 1 mg tablets in half a taking 1/2 tablet BID then she would need 30 of the 1 mg tablets or 60 of the 0.5 mg tablets. There is no reason she needs 120 tablets.  Call her and ask her who authorized the refill on 10/29 please

## 2013-06-24 NOTE — Telephone Encounter (Signed)
Pt left v/m requesting status of refill and wanted 0.5 mg # 120. I called [facility], spoke with Billey Gosling and she said received refill confirmation from Dr Dellie Catholic office on 06/23/13, and pt had spoken with [facility] about getting 0.5 mg # 120 because pt is cutting 1 mg in half and taking 1/2 tab twice a day. Billey Gosling said they could take care of at pharmacy. Left v/m for pt to contact [facility].Billey Gosling could not tell me who authorized refill on 06/23/13.

## 2013-06-24 NOTE — Telephone Encounter (Signed)
See Rena's note below.  Not sure who authorized this but it wasn't me.  Please advise.

## 2013-06-24 NOTE — Telephone Encounter (Signed)
I called [facility] to see if they could give me any more clarification about this refill.  They state [name] had called in a refill for clonazepam 1 mg for #60 on 05/28/2013 which they had on hold. This is a two month supply per pharmacy.  Patient contacted them for the refill and ask if she could get clonazepam 0.5 mg instead so they pharmacy filled it for clonazepam 0.5 mg #120 which the patient has already picked up ( technically 6 days early.).  So I am denying this current refill request sent to [place] by [facility].  I have also updated it on her medication list as clonazepam 0.5 mg take 1 tablet by mouth two times a day.  Should probably only refill for one month at a time so it is not so confusing in the future.

## 2013-06-25 NOTE — Telephone Encounter (Signed)
i agree. Thank you

## 2013-06-30 ENCOUNTER — Ambulatory Visit (AMBULATORY_SURGERY_CENTER): Payer: Self-pay

## 2013-06-30 VITALS — Ht 70.0 in | Wt 150.0 lb

## 2013-06-30 DIAGNOSIS — Z1211 Encounter for screening for malignant neoplasm of colon: Secondary | ICD-10-CM

## 2013-06-30 MED ORDER — MOVIPREP 100 G PO SOLR: 1.0000 | Freq: Once | ORAL | Status: DC

## 2013-07-01 ENCOUNTER — Ambulatory Visit
Admission: RE | Admit: 2013-07-01 | Discharge: 2013-07-01 | Disposition: A | Discharge status: Home / Self Care | Payer: BC Managed Care – PPO | Source: Ambulatory Visit | Attending: Family Medicine | Admitting: Family Medicine

## 2013-07-01 ENCOUNTER — Other Ambulatory Visit: Payer: Self-pay | Admitting: Family Medicine

## 2013-07-01 ENCOUNTER — Encounter: Payer: Self-pay | Admitting: Internal Medicine

## 2013-07-01 DIAGNOSIS — Z1231 Encounter for screening mammogram for malignant neoplasm of breast: Secondary | ICD-10-CM

## 2013-07-06 ENCOUNTER — Ambulatory Visit (INDEPENDENT_AMBULATORY_CARE_PROVIDER_SITE_OTHER): Payer: BC Managed Care – PPO | Admitting: Internal Medicine

## 2013-07-06 ENCOUNTER — Encounter: Payer: Self-pay | Admitting: Internal Medicine

## 2013-07-06 VITALS — BP 102/60 | HR 67 | Temp 98.1°F | Wt 155.0 lb

## 2013-07-06 DIAGNOSIS — J019 Acute sinusitis, unspecified: Secondary | ICD-10-CM

## 2013-07-06 MED ORDER — TRIAMCINOLONE ACETONIDE 40 MG/ML IJ SUSP
40.0000 mg | Freq: Once | INTRAMUSCULAR | Status: AC
  Administered 2013-07-06: 40 mg via INTRAMUSCULAR

## 2013-07-06 MED ORDER — AMOXICILLIN-POT CLAVULANATE 875-125 MG PO TABS: 1.0000 | ORAL_TABLET | Freq: Two times a day (BID) | ORAL | Status: DC

## 2013-07-06 NOTE — Addendum Note (Signed)
Addended by: Sueanne Margarita on: 07/06/2013 04:27 PM   Modules accepted: Orders

## 2013-07-06 NOTE — Patient Instructions (Signed)

## 2013-07-06 NOTE — Progress Notes (Signed)
Pre-visit discussion using our clinic review tool. No additional management support is needed unless otherwise documented below in the visit note.  

## 2013-07-06 NOTE — Progress Notes (Signed)
HPI  Pt presents to the clinic today with 3 week history of headache, fatigue, fever, nasal congestion with purulent drainage. She reports that is started as a cold then progressed into a sinus infection. She is prone to sinus infections. She has tried mucinex, sudafed and a saline nasal rinse without any relief. She does have a history of allergies. She does take allegra and flonase daily.   Review of Systems    Past Medical History  Diagnosis Date  . Hip fracture, left 2005    s/p ORIF  . Hypothyroidism   . Fibromyalgia   . Depression   . Allergic rhinitis   . Chronic sinusitis   . Cervical dysplasia 1990    s/p conization    Family History  Problem Relation Age of Onset  . Arthritis Mother     RA and OA  . Cancer Maternal Aunt 48    breast CA  . Colon cancer Neg Hx   . Pancreatic cancer Neg Hx   . Stomach cancer Neg Hx     History   Social History  . Marital Status: Married    Spouse Name: N/A    Number of Children: N/A  . Years of Education: N/A   Occupational History  . Not on file.   Social History Main Topics  . Smoking status: Never Smoker   . Smokeless tobacco: Never Used  . Alcohol Use: No  . Drug Use: No  . Sexual Activity: Not on file   Other Topics Concern  . 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    No Known Allergies   Constitutional: Positive headache, fatigue and fever. Denies abrupt weight changes.  HEENT:  Positive eye pain, pressure behind the eyes, facial pain, nasal congestion and sore throat. Denies eye redness, ear pain, ringing in the ears, wax buildup, runny nose or bloody nose. Respiratory: Positive cough and thick green sputum production. Denies difficulty breathing or shortness of breath.  Cardiovascular: Denies chest pain, chest tightness, palpitations or swelling in the hands or feet.   No other specific complaints in a complete review of systems (except as  listed in HPI above).  Objective:    BP 102/60  Pulse 67  Temp(Src) 98.1 F (36.7 C) (Tympanic)  Wt 155 lb (70.308 kg)  SpO2 99%  LMP 06/26/2013 Wt Readings from Last 3 Encounters:  07/06/13 155 lb (70.308 kg)  06/30/13 150 lb (68.04 kg)  06/10/13 153 lb 4 oz (69.514 kg)    General: Appears hrt stated age, well developed, well nourished in NAD. HEENT: Head: normal shape and size, maxillary sinuses tender to palpation; Eyes: sclera white, no icterus, conjunctiva pink, PERRLA and EOMs intact; Ears: Tm's gray and intact, normal light reflex; Nose: mucosa pink and moist, septum midline; Throat/Mouth: + PND. Teeth present, mucosa boggy and moist, no exudate noted, no lesions or ulcerations noted.  Neck: Mild cervical lymphadenopathy. Neck supple, trachea midline. No massses, lumps or thyromegaly present.  Cardiovascular: Normal rate and rhythm. S1,S2 noted.  No murmur, rubs or gallops noted. No JVD or BLE edema. No carotid bruits noted. Pulmonary/Chest: Normal effort and positive vesicular breath sounds. No respiratory distress. No wheezes, rales or ronchi noted.      Assessment & Plan:   Acute bacterial sinusitis  Can use a Neti Pot which can be purchased from your local drug store. Flonase 2 sprays each nostril for 3 days and then  as needed. Augmentin BID for 10 days 40 mg Kenalog IM today  RTC as needed or if symptoms persist.

## 2013-07-13 ENCOUNTER — Ambulatory Visit (AMBULATORY_SURGERY_CENTER): Payer: BC Managed Care – PPO | Admitting: Internal Medicine

## 2013-07-13 ENCOUNTER — Encounter: Payer: Self-pay | Admitting: Internal Medicine

## 2013-07-13 ENCOUNTER — Other Ambulatory Visit: Payer: Self-pay | Admitting: Internal Medicine

## 2013-07-13 VITALS — BP 141/74 | HR 57 | Temp 98.4°F | Resp 19 | Ht 70.0 in | Wt 150.0 lb

## 2013-07-13 DIAGNOSIS — Z1211 Encounter for screening for malignant neoplasm of colon: Secondary | ICD-10-CM

## 2013-07-13 DIAGNOSIS — D126 Benign neoplasm of colon, unspecified: Secondary | ICD-10-CM

## 2013-07-13 MED ORDER — SODIUM CHLORIDE 0.9 % IV SOLN: 500.0000 mL | INTRAVENOUS | Status: DC

## 2013-07-13 NOTE — Op Note (Signed)
[facility] [address] [place], [address]   COLONOSCOPY PROCEDURE REPORT  PATIENT: Fiza, Nation  MR#: [id number] BIRTHDATE: 23-Mar-1961 , 52  yrs. old GENDER: Female ENDOSCOPIST: Beverley Fiedler, MD REFERRED XB:JYNWG Aron, M.D. PROCEDURE DATE:  07/13/2013 PROCEDURE:   Colonoscopy with snare polypectomy First Screening Colonoscopy - Avg.  risk and is 50 yrs.  old or older Yes.  Prior Negative Screening - Now for repeat screening. N/A  History of Adenoma - Now for follow-up colonoscopy & has been > or = to 3 yrs.  N/A  Polyps Removed Today? Yes. ASA CLASS:   Class II INDICATIONS:average risk screening and first colonoscopy. MEDICATIONS: MAC sedation, administered by CRNA, Propofol (Diprivan), and propofol (Diprivan) 350mg  IV  DESCRIPTION OF PROCEDURE:   After the risks benefits and alternatives of the procedure were thoroughly explained, informed consent was obtained.  A digital rectal exam revealed no rectal mass.   The LB PFC-H190 [id number]  endoscope was introduced through the anus and advanced to the terminal ileum which was intubated for a short distance. No adverse events experienced.   The quality of the prep was good, using MoviPrep  The instrument was then slowly withdrawn as the colon was fully examined.   COLON FINDINGS: The mucosa appeared normal in the terminal ileum. A sessile polyp measuring 5 mm in size was found in the transverse colon.  A polypectomy was performed with a cold snare.  The resection was complete and the polyp tissue was completely retrieved.   The colon mucosa was otherwise normal.  Retroflexed views revealed no abnormalities. The time to cecum=9 minutes 11 seconds.  Withdrawal time=8 minutes 44 seconds.  The scope was withdrawn and the procedure completed. COMPLICATIONS: There were no complications.  ENDOSCOPIC IMPRESSION: 1.   Normal mucosa in the terminal ileum 2.   Sessile polyp measuring 5 mm in size was found in  the transverse colon; polypectomy was performed with a cold snare 3.   The colon mucosa was otherwise normal  RECOMMENDATIONS: 1.  Await pathology results 2.  If the polyp removed today is proven to be an adenomatous (pre-cancerous) polyps, you will need a repeat colonoscopy in 5 years.  Otherwise you should continue to follow colorectal cancer screening guidelines for "routine risk" patients with colonoscopy in 10 years.  You will receive a letter within 1-2 weeks with the results of your biopsy as well as final recommendations.  Please call my office if you have not received a letter after 3 weeks.   eSigned:  Beverley Fiedler, MD 07/13/2013 9:50 AM  cc: The Patient and Ruthe Mannan MD

## 2013-07-13 NOTE — Patient Instructions (Addendum)
YOU HAD AN ENDOSCOPIC PROCEDURE TODAY AT [facility]: Refer to the procedure report that was given to you for any specific questions about what was found during the examination.  If the procedure report does not answer your questions, please call your gastroenterologist to clarify.  If you requested that your care partner not be given the details of your procedure findings, then the procedure report has been included in a sealed envelope for you to review at your convenience later.  YOU SHOULD EXPECT: Some feelings of bloating in the abdomen. Passage of more gas than usual.  Walking can help get rid of the air that was put into your GI tract during the procedure and reduce the bloating. If you had a lower endoscopy (such as a colonoscopy or flexible sigmoidoscopy) you may notice spotting of blood in your stool or on the toilet paper. If you underwent a bowel prep for your procedure, then you may not have a normal bowel movement for a few days.  DIET: Your first meal following the procedure should be a light meal and then it is ok to progress to your normal diet.  A half-sandwich or bowl of soup is an example of a good first meal.  Heavy or fried foods are harder to digest and may make you feel nauseous or bloated.  Likewise meals heavy in dairy and vegetables can cause extra gas to form and this can also increase the bloating.  Drink plenty of fluids but you should avoid alcoholic beverages for 24 hours.  ACTIVITY: Your care partner should take you home directly after the procedure.  You should plan to take it easy, moving slowly for the rest of the day.  You can resume normal activity the day after the procedure however you should NOT DRIVE or use heavy machinery for 24 hours (because of the sedation medicines used during the test).    SYMPTOMS TO REPORT IMMEDIATELY: A gastroenterologist can be reached at any hour.  During normal business hours, 8:30 AM to 5:00 PM Monday through Friday,  call [phone].  After hours and on weekends, please call the GI answering service at [phone] EMERGENCY NUMBER  who will take a message and have the physician on call contact you.   Following lower endoscopy (colonoscopy or flexible sigmoidoscopy):  Excessive amounts of blood in the stool  Significant tenderness or worsening of abdominal pains  Swelling of the abdomen that is new, acute  Fever of 100F or higher  FOLLOW UP: If any biopsies were taken you will be contacted by phone or by letter within the next 1-3 weeks.  Call your gastroenterologist if you have not heard about the biopsies in 3 weeks.  Our staff will call the home number listed on your records the next business day following your procedure to check on you and address any questions or concerns that you may have at that time regarding the information given to you following your procedure. This is a courtesy call and so if there is no answer at the home number and we have not heard from you through the emergency physician on call, we will assume that you have returned to your regular daily activities without incident.  SIGNATURES/CONFIDENTIALITY: You and/or your care partner have signed paperwork which will be entered into your electronic medical record.  These signatures attest to the fact that that the information above on your After Visit Summary has been reviewed and is understood.  Full responsibility of the  confidentiality of this discharge information lies with you and/or your care-partner.  HANDOUT ON POLYPS  TRY MIRALAX DAILY OVER THE COUNTER. START W/ 1 CAP FULL DAILY, IF TOO LOOSE TRY 1/2 CAP FULL DAILY

## 2013-07-13 NOTE — Progress Notes (Signed)
Called to room to assist during endoscopic procedure.  Patient ID and intended procedure confirmed with present staff. Received instructions for my participation in the procedure from the performing physician.  

## 2013-07-13 NOTE — Progress Notes (Signed)
Patient did not experience any of the following events: a burn prior to discharge; a fall within the facility; wrong site/side/patient/procedure/implant event; or a hospital transfer or hospital admission upon discharge from the facility. (G8907)Patient did not have preoperative order for IV antibiotic SSI prophylaxis. (G8918) EWM 

## 2013-07-14 ENCOUNTER — Telehealth: Payer: Self-pay | Admitting: *Deleted

## 2013-07-14 NOTE — Telephone Encounter (Signed)
Message left

## 2013-07-21 ENCOUNTER — Encounter: Payer: Self-pay | Admitting: Internal Medicine

## 2013-07-28 ENCOUNTER — Telehealth: Payer: Self-pay | Admitting: Internal Medicine

## 2013-07-29 ENCOUNTER — Telehealth: Payer: Self-pay | Admitting: Family Medicine

## 2013-07-29 NOTE — Telephone Encounter (Signed)
Aram Beecham,  I spoke with [name] at length concerning her aphasia.  I answered all questions concerning her anesthestic and provided proper information regarding propofol's actions and metabolism.  She was satisfied and will f/u with her Med MD to consider an alteration of her Klonopin.  I provided her with my phone number and encouraged her to call should she have any further questions.  Best regards,  Cathlyn Parsons

## 2013-07-29 NOTE — Telephone Encounter (Signed)
Patient advised.  Call transferred for scheduling.

## 2013-07-29 NOTE — Telephone Encounter (Signed)
Unclear if this is med related.  I would still schedule OV.  She needs to be checked.

## 2013-07-29 NOTE — Telephone Encounter (Signed)
Pt reports problems with expression since her 07/13/13 COLON with Propofol. Pt reports she is having problems processing her thoughts since the procedure. She knows what she wants to say, but the correct word doesn't come out, another word does. She reports problems in the past after general anesthesia. She spoke with her pharmacist who informed her anesthesia is stored in fats cells and it takes longer for some to metabolize the fat. My Nursing Drug Book mentions Propofol is in a fat medium and it states in pt teaching, "Advise pt that performance of activities requiring mental alertness may be impaired for some time after drug use". Pt also uses Klonopin and it may interact according to the book. Informed pt I will call our head CRNA and try to determine how long she should expect these problems. Pt stated understanding.  Spoke with Cathlyn Parsons, CRNA who will call the pt.

## 2013-07-29 NOTE — Telephone Encounter (Signed)
Patient Information:  Caller Name: [name]  Phone: [phone]  Patient: [name], [name]  Gender: Female  DOB: Jan 22, 1961  Age: 52 Years  PCP: Ruthe Mannan [facility])  Pregnant: No  Office Follow Up:  Does the office need to follow up with this patient?: Yes  Instructions For The Office: Patinet asking for medication review or guidance regarding loss of words.  Declined appt. please follow up  RN Note:  She does not want to be seen but wants someone to look at medications and see if this is the culprit.  She declined appt for evaluation, encouraged patient to be seen for symptoms since 07/13/13.  Advised I would forward concerns to physician for follow up.  Symptoms  Reason For Call & Symptoms: Patient states that she thinks she is having medication side effects.  It is affecting her speech or word memory. she struggles to find the word she wants to use but knows what she wants to say. She states she does repeat her self, "Fog".   She believes this started after 07/13/13 post  Colonoscopy. She was given Propofol  . She is also on clonazepam (over a year) and has history of sleep issues and  chronic migraines. She has contacted her GI physician office as well.  She is questioning is this Propofol, combination with clonazepam ? Guidance?  She has experienced some itching lower calf/ankle left leg.  Reviewed Health History In EMR: Yes  Reviewed Medications In EMR: Yes  Reviewed Allergies In EMR: Yes  Reviewed Surgeries / Procedures: Yes  Date of Onset of Symptoms: 07/13/2013 OB / GYN:  LMP: Unknown  Guideline(s) Used:  Neurologic Deficit  Disposition Per Guideline:   See Within 3 Days in Office  Reason For Disposition Reached:   Loss of speech or garbled speech is a chronic symptom (recurrent or ongoing problem lasting > 4 weeks)  Advice Given:  N/A  RN Overrode Recommendation:  Follow Up With Office Later  Patinet asking for medication review or guidance regarding loss  of words.  Declined appt. please follow up

## 2013-07-30 ENCOUNTER — Encounter: Payer: Self-pay | Admitting: Family Medicine

## 2013-07-30 ENCOUNTER — Ambulatory Visit (INDEPENDENT_AMBULATORY_CARE_PROVIDER_SITE_OTHER): Payer: BC Managed Care – PPO | Admitting: Family Medicine

## 2013-07-30 VITALS — BP 110/68 | HR 66 | Temp 98.2°F | Wt 150.2 lb

## 2013-07-30 DIAGNOSIS — R479 Unspecified speech disturbances: Secondary | ICD-10-CM

## 2013-07-30 DIAGNOSIS — R4789 Other speech disturbances: Secondary | ICD-10-CM

## 2013-07-30 NOTE — Progress Notes (Signed)
Pre-visit discussion using our clinic review tool. No additional management support is needed unless otherwise documented below in the visit note.  No sig change in baseline meds.  Colonoscopy done 07/13/13.  No complications. Was on propofol at the time of the colonoscopy.  Unclear if she had any sx prior to the colonoscopy.  On klonopin at baseline.    She occ struggles to find the word she wants to use but knows what she wants to say.  She has to make some word substitutions. She can use "druggist" but not "pharmacist".   Some pauses in speech.  No garbled speech, speech is still fluent.  She doesn't have florid confusion.  Word searching is the main issue.    FH of dementia noted, but no known early onset.    She had memory changes/foggy sensation after an operation years ago.  It eventually resolved.    Last night she cut back to 0.5mg  of klonopin.  Had been on 1mg  at nigh prev.    Meds, vitals, and allergies reviewed.   ROS: See HPI.  Otherwise, noncontributory.  GEN: nad, alert and oriented x3.  3/3 on recall.  Normal math, normal watch face reading.  Speech fluent.  Knows presidents.  HEENT: mucous membranes moist NECK: supple w/o LA CV: rrr.  PULM: ctab, no inc wob ABD: soft, +bs EXT: no edema SKIN: no acute rash CN 2-12 wnl B, S/S/DTR wnl x4

## 2013-07-30 NOTE — Patient Instructions (Signed)
I would stay on 1 klonopin at night for now and see if this resolves.  Give [place] an update next week.   Take care.

## 2013-08-01 DIAGNOSIS — R479 Unspecified speech disturbances: Secondary | ICD-10-CM | POA: Insufficient documentation

## 2013-08-01 NOTE — Assessment & Plan Note (Addendum)
With some episodic work searching after the procedure.  She has a nonfocal neuro exam.  This could have been from combination of klonopin and propofol. Unclear source o/w.  If continued we can w/u further but I would cut back on klonopin for now and give her more time. She agrees. Nontoxic. Call back as needed. >25 min spent with face to face with patient, >50% counseling and/or coordinating care.

## 2013-08-18 ENCOUNTER — Other Ambulatory Visit: Payer: Self-pay

## 2013-08-18 MED ORDER — CLONAZEPAM 0.5 MG PO TABS: 0.5000 mg | ORAL_TABLET | Freq: Every day | ORAL | Status: DC

## 2013-08-18 NOTE — Telephone Encounter (Signed)
Pt request refill clonazepam 0.5 mg [facility]. Pt has tried to cut back on clonazepam to one at bedtime and did not work well; pt is taking clonazem 0.5 mg two at bedtime as initially ordered by Dr Dayton Martes; Dr Dayton Martes had discouraged pt from decreasing due to affecting chronic migraine. Pt request instructions to go back to taking 2 tabs at hs.(med list has one tab at hs).Please advise.

## 2013-08-18 NOTE — Telephone Encounter (Signed)
Since Dr Dayton Martes is returning soon - I do not want to make a change - but will give her enough to get through until she returns Px written for call in

## 2013-08-20 NOTE — Telephone Encounter (Signed)
Rx called in as prescribed, pt advised of Dr. Royden Purl comments  Pt wanted you to know that Dr. Dayton Martes never reduced her medication to 1 tab. Pt has been on 2 tabs for years and she mentioned to Dr. Para March last time she was here that she may want to decrease her medication, so Dr. Para March updated her med list but pt tried to take only 1 tab for over a week and she is having really bad migraines again so she wants to go back to the 2 tabs that Dr. Dayton Martes advise her to take, please advise

## 2013-08-20 NOTE — Telephone Encounter (Signed)
Pt notified and medication list updated

## 2013-08-20 NOTE — Telephone Encounter (Signed)
Aware- go ahead and take 2 then and use what you have - contact us when needing refill  Please change dosing on her med list-thanks

## 2013-08-30 ENCOUNTER — Other Ambulatory Visit: Payer: Self-pay

## 2013-08-30 MED ORDER — CLONAZEPAM 0.5 MG PO TABS: 1.0000 mg | ORAL_TABLET | Freq: Every day | ORAL | Status: DC

## 2013-08-30 NOTE — Telephone Encounter (Signed)
Pt left v/m requesting refill on clonazepam 0.5 mg taking 2 tabs at hs [facility]. Pt is going to [place] at end of this week and pt request cb. Pt has med to last until 09/06/13. Pt tried tapering clonazepam and pt did not tolerate well; pt fearful migraines might return.

## 2013-08-30 NOTE — Telephone Encounter (Signed)
Spoke to Judson Roch at requested pharmacy; Rx phoned in

## 2013-09-17 ENCOUNTER — Other Ambulatory Visit: Payer: Self-pay

## 2013-09-17 MED ORDER — CLONAZEPAM 0.5 MG PO TABS: 1.0000 mg | ORAL_TABLET | Freq: Every day | ORAL | Status: DC

## 2013-09-17 NOTE — Telephone Encounter (Signed)
Pt left v/m requesting quantity of clonazepam 0.5 mg be increased to # 60 which would be 30 day rx. Pt request rx [facility]. Pt request cb.

## 2013-09-17 NOTE — Telephone Encounter (Signed)
Phoned in to pharmacy. 

## 2013-09-20 ENCOUNTER — Encounter: Payer: Self-pay | Admitting: Internal Medicine

## 2013-09-20 ENCOUNTER — Ambulatory Visit (INDEPENDENT_AMBULATORY_CARE_PROVIDER_SITE_OTHER): Payer: BC Managed Care – PPO | Admitting: Internal Medicine

## 2013-09-20 ENCOUNTER — Telehealth: Payer: Self-pay | Admitting: Internal Medicine

## 2013-09-20 VITALS — BP 106/78 | HR 82 | Temp 98.0°F | Wt 147.0 lb

## 2013-09-20 DIAGNOSIS — J019 Acute sinusitis, unspecified: Secondary | ICD-10-CM

## 2013-09-20 DIAGNOSIS — J209 Acute bronchitis, unspecified: Secondary | ICD-10-CM

## 2013-09-20 MED ORDER — AMOXICILLIN-POT CLAVULANATE 875-125 MG PO TABS: 1.0000 | ORAL_TABLET | Freq: Two times a day (BID) | ORAL | Status: DC

## 2013-09-20 MED ORDER — TRIAMCINOLONE ACETONIDE 40 MG/ML IJ SUSP
40.0000 mg | Freq: Once | INTRAMUSCULAR | Status: AC
Start: 2013-09-20 — End: 2013-09-20
  Administered 2013-09-20: 40 mg via INTRAMUSCULAR

## 2013-09-20 NOTE — Addendum Note (Signed)
Addended by: Lurlean Nanny on: 09/20/2013 12:02 PM   Modules accepted: Orders

## 2013-09-20 NOTE — Telephone Encounter (Signed)
Pt forgot to ask for a strong cough medicine with codeine in it.  Could you please call this in for her and let her know?  Thanks!   Pharmacy: [facility]

## 2013-09-20 NOTE — Progress Notes (Signed)
HPI  Pt presents to the clinic today with c/o cough, chest congestion, headache, nasal congestion, facial pressure, ear fullness and sore throat. This started about 10 days ago. The cough is productive of thick brown mucous. She is also blowing thick green mucous out of her nose. She has tried Mucinex, Copywriter, advertising cough and cold, and Vicks with only minimal relief. She has a history of allergies and recurrent sinus infections. She does not smoke. She denies fever but has had chills and body aches. She has had sick contacts.  Review of Systems      Past Medical History  Diagnosis Date  . Hip fracture, left 2005    s/p ORIF  . Hypothyroidism   . Fibromyalgia   . Depression   . Allergic rhinitis   . Chronic sinusitis   . Cervical dysplasia 1990    s/p conization    Family History  Problem Relation Age of Onset  . Arthritis Mother     RA and OA  . Cancer Maternal Aunt 48    breast CA  . Colon cancer Neg Hx   . Pancreatic cancer Neg Hx   . Stomach cancer Neg Hx     History   Social History  . Marital Status: Married    Spouse Name: N/A    Number of Children: N/A  . Years of Education: N/A   Occupational History  . Not on file.   Social History Main Topics  . Smoking status: Never Smoker   . Smokeless tobacco: Never Used  . Alcohol Use: Yes     Comment: rarely  . Drug Use: No  . Sexual Activity: Not on file   Other Topics Concern  . 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    No Known Allergies   Constitutional: Positive headache, fatigue. Denies fever or abrupt weight changes.  HEENT:  Positive facial pain, nasal congestion and sore throat. Denies eye redness, eye pain, ear pain, ringing in the ears, wax buildup, runny nose or bloody nose. Respiratory: Positive cough. Denies difficulty breathing or shortness of breath.  Cardiovascular: Denies chest pain, chest tightness, palpitations or swelling in  the hands or feet.   No other specific complaints in a complete review of systems (except as listed in HPI above).  Objective:   BP 106/78  Pulse 82  Temp(Src) 98 F (36.7 C) (Oral)  Wt 147 lb (66.679 kg)  SpO2 98% Wt Readings from Last 3 Encounters:  09/20/13 147 lb (66.679 kg)  07/30/13 150 lb 4 oz (68.153 kg)  07/13/13 150 lb (68.04 kg)     General: Appears her stated age, well developed, well nourished in NAD. HEENT: Head: normal shape and size, frontal sinus tenderness noted; Eyes: sclera white, no icterus, conjunctiva pink, PERRLA and EOMs intact; Ears: Tm's pink and intact, normal light reflex; Nose: mucosa pink and moist, septum midline; Throat/Mouth: + PND. Teeth present, mucosa erythematous and moist, no exudate noted, no lesions or ulcerations noted.  Neck: Mild cervical lymphadenopathy. Neck supple, trachea midline. No massses, lumps or thyromegaly present.  Cardiovascular: Normal rate and rhythm. S1,S2 noted.  No murmur, rubs or gallops noted. No JVD or BLE edema. No carotid bruits noted. Pulmonary/Chest: Normal effort and bilateral expiratory wheeze noted. No respiratory distress. No rales or ronchi noted.      Assessment & Plan:   Acute sinusitis with bronchitis:  Get some rest and drink plenty  of water Do salt water gargles for the sore throat eRx for Augmentin BID x 10 days 40 mg Kenalog per pt request (this is usually what she gets for her sinus infections)  RTC as needed or if symptoms persist.

## 2013-09-20 NOTE — Telephone Encounter (Signed)
I can but all cough syrup with codeine needs a hard copy of the script. Is she willing to come pick this up here and take it to the pharmacy?

## 2013-09-20 NOTE — Telephone Encounter (Signed)
Pt states she does not want to come back out and will try Delsym OTC

## 2013-09-20 NOTE — Progress Notes (Signed)
Pre-visit discussion using our clinic review tool. No additional management support is needed unless otherwise documented below in the visit note.  

## 2013-09-20 NOTE — Patient Instructions (Signed)

## 2013-09-21 ENCOUNTER — Other Ambulatory Visit: Payer: Self-pay | Admitting: Internal Medicine

## 2013-09-21 MED ORDER — HYDROCODONE-HOMATROPINE 5-1.5 MG/5ML PO SYRP: 5.0000 mL | ORAL_SOLUTION | Freq: Three times a day (TID) | ORAL | Status: DC | PRN

## 2013-09-21 NOTE — Telephone Encounter (Signed)
Patient's husband came into office stating pt did in fact want Rx--[facility] printed Rx and was given to husband

## 2013-09-30 ENCOUNTER — Other Ambulatory Visit: Payer: Self-pay | Admitting: *Deleted

## 2013-09-30 MED ORDER — SUMATRIPTAN SUCCINATE 50 MG PO TABS: 50.0000 mg | ORAL_TABLET | ORAL | Status: DC | PRN

## 2013-09-30 MED ORDER — THYROID 65 MG PO TABS: 65.0000 mg | ORAL_TABLET | Freq: Every day | ORAL | Status: DC

## 2013-10-01 ENCOUNTER — Telehealth: Payer: Self-pay | Admitting: Family Medicine

## 2013-10-01 NOTE — Telephone Encounter (Signed)
Patient Information:  Caller Name: [place]leigh  Phone: [phone]  Patient: [name], [name]  Gender: Female  DOB: [birth year]  Age: 53 Years  PCP: Arnette Norris [facility])  Pregnant: No  Office Follow Up:  Does the office need to follow up with this patient?: Yes  Instructions For The Office: Please contact patient to give further instructions.  RN Note:  Please contact patient to give further instructions. She has improved since taking antibiotic, but she would like to make sure the wheezing doesn't lead to pneumonia.  Symptoms  Reason For Call & Symptoms: Was seen in office approximately 2 weeks ago and diagnosed with sinus infection, upper respiratory infection, bronchitis. Has completed antibiotic that was prescribed. Reports she has wheezing, increased fatigue still present.  Reviewed Health History In EMR: Yes  Reviewed Medications In EMR: Yes  Reviewed Allergies In EMR: Yes  Reviewed Surgeries / Procedures: Yes  Date of Onset of Symptoms: 09/03/2013  Treatments Tried: Augmentin  Treatments Tried Worked: Yes OB / GYN:  LMP: 09/26/2013  Guideline(s) Used:  No Protocol Available - Sick Adult  Disposition Per Guideline:   Discuss with PCP and Callback by Nurse within 1 Hour  Reason For Disposition Reached:   Nursing judgment  Advice Given:  N/A  Patient Will Follow Care Advice:  YES

## 2013-10-01 NOTE — Telephone Encounter (Signed)
Spoke to pt and advised per Dr Deborra Medina; pt verbalized understanding.

## 2013-10-01 NOTE — Telephone Encounter (Signed)
Reviewed note- saw [name] for this.  Cough can linger.  If she is feeling bad, needs to be seen this weekend- can go to weekend clinic.

## 2013-10-18 ENCOUNTER — Other Ambulatory Visit: Payer: Self-pay

## 2013-10-18 MED ORDER — CLONAZEPAM 0.5 MG PO TABS: 1.0000 mg | ORAL_TABLET | Freq: Every day | ORAL | Status: DC

## 2013-10-18 NOTE — Telephone Encounter (Signed)
Pt left v/m requesting refill clonazepam to [facility]; pt is out of med today.

## 2013-10-18 NOTE — Telephone Encounter (Signed)
Lm on pts vm informing her Rx called in to requested pharmacy 

## 2013-11-12 ENCOUNTER — Other Ambulatory Visit: Payer: Self-pay | Admitting: *Deleted

## 2013-11-12 NOTE — Telephone Encounter (Signed)
Pt requesting medication refill. Last ov 05/2013 with no future appts scheduled. pls advise 

## 2013-11-15 MED ORDER — CLONAZEPAM 0.5 MG PO TABS: 1.0000 mg | ORAL_TABLET | Freq: Every day | ORAL | Status: DC

## 2013-11-15 NOTE — Telephone Encounter (Signed)
Pt left v/m requesting refill on clonazepam done today to [facility].Please advise.

## 2013-11-15 NOTE — Telephone Encounter (Signed)
Spoke to pt and informed her Rx has been called in to requested pharmacy 

## 2013-11-15 NOTE — Telephone Encounter (Signed)
Ok to phone in klonipin 

## 2013-11-16 ENCOUNTER — Other Ambulatory Visit: Payer: Self-pay | Admitting: *Deleted

## 2013-11-16 MED ORDER — CLONAZEPAM 0.5 MG PO TABS: 1.0000 mg | ORAL_TABLET | Freq: Every day | ORAL | Status: DC

## 2013-11-16 NOTE — Telephone Encounter (Signed)
Pt requesting medication refill. Last f/u appt 05/2013 with no future appts scheduled. pls advise 

## 2013-11-17 NOTE — Telephone Encounter (Signed)
Lm on pts vm informing her Rx has been called in to requested pharmacy 

## 2013-12-06 ENCOUNTER — Other Ambulatory Visit: Payer: Self-pay | Admitting: Family Medicine

## 2013-12-08 ENCOUNTER — Other Ambulatory Visit: Payer: Self-pay | Admitting: *Deleted

## 2013-12-13 ENCOUNTER — Other Ambulatory Visit: Payer: Self-pay

## 2013-12-13 MED ORDER — CLONAZEPAM 0.5 MG PO TABS: 1.0000 mg | ORAL_TABLET | Freq: Every day | ORAL | Status: DC

## 2013-12-13 NOTE — Telephone Encounter (Signed)
Pt said [facility] has requested refill on clonazepam on 12/06/13 and 12/08/13, pt said she had requested early because it takes a while to get med called in to pharmacy and pt cannot sleep if she does not have clonazepam. Pt said there was a note about office visit before additional refills and pt had CPX in 05/2013 and pt wants to know if she is going to have to come in for appt more than once a year.pt request cb.

## 2013-12-13 NOTE — Telephone Encounter (Signed)
Spoke to pt and informed her Rx has been called in to requested pharmacy; f/u appt scheduled

## 2013-12-28 ENCOUNTER — Ambulatory Visit (INDEPENDENT_AMBULATORY_CARE_PROVIDER_SITE_OTHER): Payer: BC Managed Care – PPO | Admitting: Family Medicine

## 2013-12-28 ENCOUNTER — Encounter: Payer: Self-pay | Admitting: Family Medicine

## 2013-12-28 VITALS — BP 106/66 | HR 72 | Temp 98.6°F | Ht 69.5 in | Wt 151.2 lb

## 2013-12-28 DIAGNOSIS — R42 Dizziness and giddiness: Secondary | ICD-10-CM | POA: Insufficient documentation

## 2013-12-28 DIAGNOSIS — E039 Hypothyroidism, unspecified: Secondary | ICD-10-CM

## 2013-12-28 DIAGNOSIS — G4700 Insomnia, unspecified: Secondary | ICD-10-CM

## 2013-12-28 DIAGNOSIS — G43909 Migraine, unspecified, not intractable, without status migrainosus: Secondary | ICD-10-CM

## 2013-12-28 LAB — CBC WITH DIFFERENTIAL/PLATELET
BASOS PCT: 0.9 % (ref 0.0–3.0)
Basophils Absolute: 0.1 10*3/uL (ref 0.0–0.1)
EOS ABS: 0.2 10*3/uL (ref 0.0–0.7)
Eosinophils Relative: 2.8 % (ref 0.0–5.0)
HCT: 38.5 % (ref 36.0–46.0)
Hemoglobin: 12.9 g/dL (ref 12.0–15.0)
LYMPHS ABS: 1.6 10*3/uL (ref 0.7–4.0)
Lymphocytes Relative: 23.9 % (ref 12.0–46.0)
MCHC: 33.5 g/dL (ref 30.0–36.0)
MCV: 95.9 fl (ref 78.0–100.0)
MONO ABS: 0.6 10*3/uL (ref 0.1–1.0)
Monocytes Relative: 9.2 % (ref 3.0–12.0)
NEUTROS PCT: 63.2 % (ref 43.0–77.0)
Neutro Abs: 4.3 10*3/uL (ref 1.4–7.7)
PLATELETS: 221 10*3/uL (ref 150.0–400.0)
RBC: 4.02 Mil/uL (ref 3.87–5.11)
RDW: 15.5 % (ref 11.5–15.5)
WBC: 6.8 10*3/uL (ref 4.0–10.5)

## 2013-12-28 LAB — VITAMIN B12: VITAMIN B 12: 430 pg/mL (ref 211–911)

## 2013-12-28 LAB — FERRITIN: Ferritin: 15.3 ng/mL (ref 10.0–291.0)

## 2013-12-28 LAB — T4, FREE: Free T4: 0.85 ng/dL (ref 0.60–1.60)

## 2013-12-28 LAB — TSH: TSH: 1.07 u[IU]/mL (ref 0.35–4.50)

## 2013-12-28 MED ORDER — CLONAZEPAM 0.5 MG PO TABS: 1.0000 mg | ORAL_TABLET | Freq: Every day | ORAL | Status: DC

## 2013-12-28 NOTE — Patient Instructions (Signed)
Great to see you. I will call you with your lab results.   

## 2013-12-28 NOTE — Assessment & Plan Note (Signed)
STable on current rx. No changes. 

## 2013-12-28 NOTE — Progress Notes (Signed)
Pre visit review using our clinic review tool, if applicable. No additional management support is needed unless otherwise documented below in the visit note. 

## 2013-12-28 NOTE — Assessment & Plan Note (Signed)
Intermittent. At this point, I would doubt still related to donating blood. More likely due to migraines and botox injections. Will check labs today to rule out other possible causes. Also advised to discuss this with Dr. Domingo Cocking and to let me know if if symptoms progress or continue. The patient indicates understanding of these issues and agrees with the plan.

## 2013-12-28 NOTE — Progress Notes (Signed)
Subjective:    Patient ID: [name], female    DOB: Dec 18, 1960, 53 y.o.   MRN: [id number]  HPI  53 yo pleasant female here for med refills.  Also has had dizziness since giving blood and receiving botox injections for her migraines last month.  No CP or SOB.  Also feels more fatigued and more memory loss.  Hypothyroidism- On Armour 60 mg daily for hypothyroidism.  Denies any symptoms of hypo or hyperthyroidism.  Lab Results  Component Value Date   TSH 2.06 06/03/2013    Lab Results  Component Value Date   CHOL 168 06/03/2013   HDL 76.40 06/03/2013   LDLCALC 81 06/03/2013   TRIG 51.0 06/03/2013   CHOLHDL 2 06/03/2013   Insomnia- started after she started botox injections. . Clonazepam does work.  She has cut back to 0.5- 1 mg at bedtime.  Patient Active Problem List   Diagnosis Date Noted  . Speech abnormality 08/01/2013  . Insomnia 06/30/2012  . Migraine 05/23/2011  . Depression   . Hypothyroidism    Past Medical History  Diagnosis Date  . Hip fracture, left 2005    s/p ORIF  . Hypothyroidism   . Fibromyalgia   . Depression   . Allergic rhinitis   . Chronic sinusitis   . Cervical dysplasia 1990    s/p conization   Past Surgical History  Procedure Laterality Date  . Orif hip fracture  2005    Daldorf  . Breast enhancement surgery  2007  . Tonsillectomy and adenoidectomy    . Dilation and curettage of uterus  1989   History  Substance Use Topics  . Smoking status: Never Smoker   . Smokeless tobacco: Never Used  . Alcohol Use: Yes     Comment: rarely   Family History  Problem Relation Age of Onset  . Arthritis Mother     RA and OA  . Cancer Maternal Aunt 48    breast CA  . Colon cancer Neg Hx   . Pancreatic cancer Neg Hx   . Stomach cancer Neg Hx    No Known Allergies Current Outpatient Prescriptions on File Prior to Visit  Medication Sig Dispense Refill  . baclofen (LIORESAL) 10 MG tablet as needed.       . clonazePAM (KLONOPIN) 0.5 MG  tablet Take 2 tablets (1 mg total) by mouth at bedtime.  60 tablet  0  . fexofenadine (ALLEGRA) 180 MG tablet Take 180 mg by mouth as needed for allergies or rhinitis.      . fluticasone (FLONASE) 50 MCG/ACT nasal spray Place 2 sprays into the nose daily.  16 g  3  . hydrOXYzine (ATARAX/VISTARIL) 50 MG tablet Take 1 tablet (50 mg total) by mouth every 6 (six) hours as needed.  60 tablet  0  . Magnesium Citrate 100 MG TABS Take 200 mg by mouth daily.      . Melatonin 10 MG TABS Take 10 mg by mouth at bedtime.      . Multiple Vitamin (MULTIVITAMIN) capsule Take 1 capsule by mouth daily.        Marland Kitchen OVER THE COUNTER MEDICATION Biotin Super Collagen C, collagen 3000mg  and biotin 2570mcg, takes tid      . OVER THE COUNTER MEDICATION Fish oil 3-6-9      . PRESCRIPTION MEDICATION botox injections for migraine      . SUMAtriptan (IMITREX) 50 MG tablet Take 1 tablet (50 mg total) by mouth as needed.  10 tablet  6  . thyroid (ARMOUR) 65 MG tablet Take 1 tablet (65 mg total) by mouth daily. *Please schedule appointment in August or September  with Dr, having labs prior* Thanks!  90 tablet  0  . Zinc 25 MG TABS Take 1 tablet by mouth daily.         No current facility-administered medications on file prior to visit.   The PMH, PSH, Social History, Family History, Medications, and allergies have been reviewed in [facility], and have been updated if relevant.   Review of Systems See HPI     Objective:   Physical Exam BP 106/66  Pulse 72  Temp(Src) 98.6 F (37 C) (Oral)  Ht 5' 9.5" (1.765 m)  Wt 151 lb 4 oz (68.607 kg)  BMI 22.02 kg/m2  LMP 12/09/2013 Wt Readings from Last 3 Encounters:  12/28/13 151 lb 4 oz (68.607 kg)  09/20/13 147 lb (66.679 kg)  07/30/13 150 lb 4 oz (68.153 kg)     General:  Well-developed,well-nourished,in no acute distress; alert,appropriate and cooperative throughout examination Head:  normocephalic and atraumatic.   Eyes:  vision grossly intact, pupils equal, pupils round,  and pupils reactive to light.   Ears:  R ear normal and L ear normal.   Nose:  no external deformity.   Mouth:  good dentition.   Neck:  No deformities, masses, or tenderness noted. Lungs:  Normal respiratory effort, chest expands symmetrically. Lungs are clear to auscultation, no crackles or wheezes. Heart:  Normal rate and regular rhythm. S1 and S2 normal without gallop, murmur, click, rub or other extra sounds. Abdomen:  Bowel sounds positive,abdomen soft and non-tender without masses, organomegaly or hernias noted. Msk:  No deformity or scoliosis noted of thoracic or lumbar spine.   Extremities:  No clubbing, cyanosis, edema, or deformity noted with normal full range of motion of all joints.   Neurologic:  alert & oriented X3 and gait normal.   Skin:  Intact without suspicious lesions or rashes Cervical Nodes:  No lymphadenopathy noted Axillary Nodes:  No palpable lymphadenopathy Psych:  Cognition and judgment appear intact. Alert and cooperative with normal attention span and concentration. No apparent delusions, illusions, hallucinations    Assessment & Plan:

## 2013-12-30 ENCOUNTER — Encounter: Payer: Self-pay | Admitting: Family Medicine

## 2013-12-30 ENCOUNTER — Ambulatory Visit (INDEPENDENT_AMBULATORY_CARE_PROVIDER_SITE_OTHER): Payer: BC Managed Care – PPO

## 2013-12-30 ENCOUNTER — Telehealth: Payer: Self-pay

## 2013-12-30 DIAGNOSIS — E538 Deficiency of other specified B group vitamins: Secondary | ICD-10-CM

## 2013-12-30 MED ORDER — CYANOCOBALAMIN 1000 MCG/ML IJ SOLN
1000.0000 ug | Freq: Once | INTRAMUSCULAR | Status: AC
Start: 2013-12-30 — End: 2013-12-30
  Administered 2013-12-30: 1000 ug via INTRAMUSCULAR

## 2013-12-30 MED ORDER — CYANOCOBALAMIN 1000 MCG/ML IJ SOLN: 1000.0000 ug | INTRAMUSCULAR | Status: DC

## 2013-12-30 NOTE — Telephone Encounter (Signed)
Rx sent 

## 2013-12-30 NOTE — Telephone Encounter (Signed)
Pt received her first Vit b 12 injection this morning and pt said she lives in [place] and wants to know if Vit B12 injection could be sent to [place] and pts husband give pt the Vit B 12 injections. Pt said her husband gives himself B 12 injections so pt feels comfortable with husband giving her shot and pt would not have to come to office monthly. Pt request cb.

## 2013-12-30 NOTE — Telephone Encounter (Signed)
Left message on machine that rx was sent and to call back if any further questions

## 2014-01-06 ENCOUNTER — Ambulatory Visit: Payer: BC Managed Care – PPO

## 2014-01-12 ENCOUNTER — Other Ambulatory Visit: Payer: Self-pay | Admitting: *Deleted

## 2014-01-12 MED ORDER — THYROID 65 MG PO TABS: 65.0000 mg | ORAL_TABLET | Freq: Every day | ORAL | Status: DC

## 2014-01-19 ENCOUNTER — Telehealth: Payer: Self-pay | Admitting: Family Medicine

## 2014-01-19 NOTE — Telephone Encounter (Signed)
Dr. Domingo Cocking is excellent but doctor patient relationships are like any other kind of relationship- they don't always fit.  Just let us know how I can help.

## 2014-01-19 NOTE — Telephone Encounter (Signed)
Patient has been seeing Dr.Freeman for her headaches.  Patient said she doesn't think that they're a good fit. Patient said Dr.Freeman doesn't have a good bedside manner. Patient said she wants to get a second opinion, but she's not sure if she wants to leave Dr. Domingo Cocking.

## 2014-01-19 NOTE — Telephone Encounter (Signed)
Spoke to pt who states that she will "think on it" and contact us back to inform us whether she is wanting a second opinion with an alternate provider or will continue to see Dr Domingo Cocking.

## 2014-01-24 ENCOUNTER — Other Ambulatory Visit: Payer: Self-pay | Admitting: Specialist

## 2014-01-24 DIAGNOSIS — R51 Headache: Principal | ICD-10-CM

## 2014-01-24 DIAGNOSIS — R42 Dizziness and giddiness: Secondary | ICD-10-CM

## 2014-01-24 DIAGNOSIS — R519 Headache, unspecified: Secondary | ICD-10-CM

## 2014-01-26 ENCOUNTER — Ambulatory Visit: Payer: BC Managed Care – PPO

## 2014-01-26 ENCOUNTER — Encounter: Payer: Self-pay | Admitting: Family Medicine

## 2014-01-29 ENCOUNTER — Ambulatory Visit
Admission: RE | Admit: 2014-01-29 | Discharge: 2014-01-29 | Disposition: A | Discharge status: Home / Self Care | Payer: BC Managed Care – PPO | Source: Ambulatory Visit | Attending: Specialist | Admitting: Specialist

## 2014-01-29 DIAGNOSIS — R51 Headache: Principal | ICD-10-CM

## 2014-01-29 DIAGNOSIS — R42 Dizziness and giddiness: Secondary | ICD-10-CM

## 2014-01-29 DIAGNOSIS — R519 Headache, unspecified: Secondary | ICD-10-CM

## 2014-02-11 ENCOUNTER — Other Ambulatory Visit: Payer: Self-pay | Admitting: *Deleted

## 2014-02-11 NOTE — Telephone Encounter (Signed)
Ok to refill? Last filled 12/28/13. Appt scheduled for 03/02/14.

## 2014-02-14 MED ORDER — CLONAZEPAM 0.5 MG PO TABS: 1.0000 mg | ORAL_TABLET | Freq: Every day | ORAL | Status: DC

## 2014-02-14 NOTE — Telephone Encounter (Signed)
Spoke to pt and informed her Rx has been faxed to requested pharmacy 

## 2014-03-02 ENCOUNTER — Ambulatory Visit: Payer: BC Managed Care – PPO | Admitting: Family Medicine

## 2014-03-09 ENCOUNTER — Encounter: Payer: Self-pay | Admitting: Family Medicine

## 2014-03-09 ENCOUNTER — Ambulatory Visit (INDEPENDENT_AMBULATORY_CARE_PROVIDER_SITE_OTHER): Payer: BC Managed Care – PPO | Admitting: Family Medicine

## 2014-03-09 VITALS — BP 100/54 | HR 71 | Temp 98.0°F | Ht 69.25 in | Wt 156.8 lb

## 2014-03-09 DIAGNOSIS — E039 Hypothyroidism, unspecified: Secondary | ICD-10-CM

## 2014-03-09 DIAGNOSIS — F329 Major depressive disorder, single episode, unspecified: Secondary | ICD-10-CM

## 2014-03-09 DIAGNOSIS — E538 Deficiency of other specified B group vitamins: Secondary | ICD-10-CM

## 2014-03-09 DIAGNOSIS — F3289 Other specified depressive episodes: Secondary | ICD-10-CM

## 2014-03-09 DIAGNOSIS — G43909 Migraine, unspecified, not intractable, without status migrainosus: Secondary | ICD-10-CM

## 2014-03-09 DIAGNOSIS — F32A Depression, unspecified: Secondary | ICD-10-CM

## 2014-03-09 DIAGNOSIS — G4700 Insomnia, unspecified: Secondary | ICD-10-CM

## 2014-03-09 MED ORDER — CYANOCOBALAMIN 1000 MCG/ML IJ SOLN
1000.0000 ug | Freq: Once | INTRAMUSCULAR | Status: AC
  Administered 2014-03-09: 1000 ug via INTRAMUSCULAR

## 2014-03-09 NOTE — Assessment & Plan Note (Signed)
Deteriorated. >25 minutes spent in face to face time with patient, >50% spent in counselling or coordination of care She is very concerned that her migraines are getting worse. She would like second opinion which I think is quite reasonable.  She is aware that another neurologist may or may not have other suggestions. Referral placed.

## 2014-03-09 NOTE — Progress Notes (Signed)
Pre visit review using our clinic review tool, if applicable. No additional management support is needed unless otherwise documented below in the visit note. 

## 2014-03-09 NOTE — Addendum Note (Signed)
Addended by: Modena Nunnery on: 03/09/2014 12:50 PM   Modules accepted: Orders

## 2014-03-09 NOTE — Patient Instructions (Signed)
Please stop by see Rosaria Ferries on your way out.  She is expecting you. Hang in there.  Keep me updated.

## 2014-03-09 NOTE — Progress Notes (Signed)
Subjective:    Patient ID: [name], female    DOB: [birth year], 53 y.o.   MRN: [id number]  HPI  53 yo pleasant female here for follow up.  Started B12 injections in 12/2013. Lab Results  Component Value Date   VITAMINB12 430 12/28/2013    Migraines- unfortunately a chronic issue and deteriorating.  Having light headedness and daily headaches.  Seeing Dr. Domingo [facility]ing [facility] and per pt, he feels dizziness is a new "classic migraine" manifestation.  Still getting botox injections.  Having daily headaches that are not as severe as migraines prior to receiving botox.  Still having photophobia. Restarted Topamax last week- she really was resisting this because she does not like how it makes her feel.        Study Result    CLINICAL DATA: Chronic headaches. Dizziness.  EXAM:  MRI HEAD WITHOUT CONTRAST  TECHNIQUE:  Multiplanar, multiecho pulse sequences of the brain and surrounding  structures were obtained without intravenous contrast.  COMPARISON: None.  FINDINGS:  No acute infarct, hemorrhage, or mass lesion is present. The  ventricles are of normal size. No significant extraaxial fluid  collection is present.  Flow is present in the major intracranial arteries. The globes and  orbits are intact. A polypoid lesion posteriorly in the right  maxillary sinus measures 18 x 14 mm. The lesion is heterogeneous.  The remaining paranasal sinuses and the mastoid air cells are clear.  IMPRESSION:  1. Normal MRI appearance the brain without acute or focal lesion to  account for headaches.  2. Heterogeneous polypoid 18 mm lesion within the posterior right  maxillary sinus likely represents a benign polyp. Recommend  follow-up MRI or CT at 3-6 months to assure stability.  Electronically Signed  By: Lawrence Santiago M.D.  On: 01/30/2014 10:40    Hypothyroidism- On Armour 60 mg daily for hypothyroidism.  Denies any symptoms of hypo or hyperthyroidism.  Lab Results   Component Value Date   TSH 1.07 12/28/2013    Lab Results  Component Value Date   CHOL 168 06/03/2013   HDL 76.40 06/03/2013   LDLCALC 81 06/03/2013   TRIG 51.0 06/03/2013   CHOLHDL 2 06/03/2013   Insomnia- started after she started botox injections. . Clonazepam does work.  She has cut back to 0.5- 1 mg at bedtime.  Patient Active Problem List   Diagnosis Date Noted  . Dizziness and giddiness 12/28/2013  . Speech abnormality 08/01/2013  . Insomnia 06/30/2012  . Migraine 05/23/2011  . Depression   . Hypothyroidism    Past Medical History  Diagnosis Date  . Hip fracture, left 2005    s/p ORIF  . Hypothyroidism   . Fibromyalgia   . Depression   . Allergic rhinitis   . Chronic sinusitis   . Cervical dysplasia 1990    s/p conization   Past Surgical History  Procedure Laterality Date  . Orif hip fracture  2005    Daldorf  . Breast enhancement surgery  2007  . Tonsillectomy and adenoidectomy    . Dilation and curettage of uterus  1989   History  Substance Use Topics  . Smoking status: Never Smoker   . Smokeless tobacco: Never Used  . Alcohol Use: Yes     Comment: rarely   Family History  Problem Relation Age of Onset  . Arthritis Mother     RA and OA  . Cancer Maternal Aunt 48    breast CA  . Colon cancer  Neg Hx   . Pancreatic cancer Neg Hx   . Stomach cancer Neg Hx    No Known Allergies Current Outpatient Prescriptions on File Prior to Visit  Medication Sig Dispense Refill  . baclofen (LIORESAL) 10 MG tablet as needed.       . cetirizine (ZYRTEC) 10 MG tablet Take 10 mg by mouth daily as needed for allergies.      . clonazePAM (KLONOPIN) 0.5 MG tablet Take 2 tablets (1 mg total) by mouth at bedtime.  60 tablet  0  . cyanocobalamin (,VITAMIN B-12,) 1000 MCG/ML injection Inject 1 mL (1,000 mcg total) into the muscle every 30 (thirty) days.  1 mL  11  . fexofenadine (ALLEGRA) 180 MG tablet Take 180 mg by mouth as needed for allergies or rhinitis.      .  fluticasone (FLONASE) 50 MCG/ACT nasal spray Place 2 sprays into the nose daily.  16 g  3  . hydrOXYzine (ATARAX/VISTARIL) 50 MG tablet Take 1 tablet (50 mg total) by mouth every 6 (six) hours as needed.  60 tablet  0  . Magnesium Citrate 100 MG TABS Take 200 mg by mouth daily.      . Melatonin 10 MG TABS Take 10 mg by mouth at bedtime.      . Multiple Vitamin (MULTIVITAMIN) capsule Take 1 capsule by mouth daily.        Marland Kitchen OVER THE COUNTER MEDICATION Biotin Super Collagen C, collagen 3000mg  and biotin 2569mcg, takes tid      . OVER THE COUNTER MEDICATION Fish oil 3-6-9      . PRESCRIPTION MEDICATION botox injections for migraine      . SUMAtriptan (IMITREX) 50 MG tablet Take 1 tablet (50 mg total) by mouth as needed.  10 tablet  6  . thyroid (ARMOUR) 65 MG tablet Take 1 tablet (65 mg total) by mouth daily.  90 tablet  0  . Zinc 25 MG TABS Take 1 tablet by mouth daily.         No current facility-administered medications on file prior to visit.   The PMH, PSH, Social History, Family History, Medications, and allergies have been reviewed in [facility], and have been updated if relevant.   Review of Systems See HPI     Objective:   Physical Exam Ht 5' 9.25" (1.759 m)  Wt 156 lb 12 oz (71.101 kg)  BMI 22.98 kg/m2 Wt Readings from Last 3 Encounters:  03/09/14 156 lb 12 oz (71.101 kg)  12/28/13 151 lb 4 oz (68.607 kg)  09/20/13 147 lb (66.679 kg)     General:  Well-developed,well-nourished,in no acute distress; alert,appropriate and cooperative throughout examination Head:  normocephalic and atraumatic.   Eyes:  vision grossly intact, pupils equal, pupils round, and pupils reactive to light.   Ears:  R ear normal and L ear normal.   Nose:  no external deformity.   Mouth:  good dentition.   Neck:  No deformities, masses, or tenderness noted. Lungs:  Normal respiratory effort, chest expands symmetrically. Lungs are clear to auscultation, no crackles or wheezes. Heart:  Normal rate and  regular rhythm. S1 and S2 normal without gallop, murmur, click, rub or other extra sounds. Abdomen:  Bowel sounds positive,abdomen soft and non-tender without masses, organomegaly or hernias noted. Msk:  No deformity or scoliosis noted of thoracic or lumbar spine.   Extremities:  No clubbing, cyanosis, edema, or deformity noted with normal full range of motion of all joints.   Neurologic:  alert &  oriented X3 and gait normal.   Skin:  Intact without suspicious lesions or rashes Psych:  Cognition and judgment appear intact. Alert and cooperative with normal attention span and concentration. No apparent delusions, illusions, hallucinations    Assessment & Plan:

## 2014-03-14 ENCOUNTER — Other Ambulatory Visit: Payer: Self-pay | Admitting: *Deleted

## 2014-03-14 ENCOUNTER — Telehealth: Payer: Self-pay

## 2014-03-14 MED ORDER — BUPROPION HCL ER (XL) 150 MG PO TB24: 150.0000 mg | ORAL_TABLET | Freq: Every day | ORAL | Status: DC

## 2014-03-14 NOTE — Telephone Encounter (Signed)
Spoke to pt and informed her Rx has been sent to requested pharmacy;advised per Dr Deborra Medina and pt verbally expressed understanding.

## 2014-03-14 NOTE — Telephone Encounter (Signed)
Pt left v/m; pt last seen 03/09/2014; pt has chronic migraine condition and pt said she and Dr Deborra Medina discussed pt's mood and possible depression due to chronic pain; pt said years ago pt was taking Wellbutrin; pt cannot remember dosage. Pt would like to try taking Wellbutrin again and request called in to [facility]. Pt request cb.

## 2014-03-14 NOTE — Telephone Encounter (Signed)
Pt requesting medication refill. Last f/u appt 02/2014 with no future appts scheduled. pls advise

## 2014-03-14 NOTE — Telephone Encounter (Signed)
Yes I am ok with this.  She may want to check with Dr. Domingo Cocking as I know he feels antidepressants can worsen migraines.  eRx sent.  Please update me with symptoms in 3-4 weeks.

## 2014-03-15 MED ORDER — CLONAZEPAM 0.5 MG PO TABS: 1.0000 mg | ORAL_TABLET | Freq: Every day | ORAL | Status: DC

## 2014-03-15 NOTE — Telephone Encounter (Signed)
Rx has been faxed to requested pharmacy

## 2014-03-28 ENCOUNTER — Other Ambulatory Visit: Payer: Self-pay | Admitting: *Deleted

## 2014-03-28 MED ORDER — THYROID 65 MG PO TABS: 65.0000 mg | ORAL_TABLET | Freq: Every day | ORAL | Status: DC

## 2014-04-01 ENCOUNTER — Ambulatory Visit (INDEPENDENT_AMBULATORY_CARE_PROVIDER_SITE_OTHER): Payer: BC Managed Care – PPO | Admitting: Internal Medicine

## 2014-04-01 ENCOUNTER — Encounter: Payer: Self-pay | Admitting: Internal Medicine

## 2014-04-01 VITALS — BP 110/60 | HR 72 | Temp 98.3°F | Wt 156.2 lb

## 2014-04-01 DIAGNOSIS — J0100 Acute maxillary sinusitis, unspecified: Secondary | ICD-10-CM

## 2014-04-01 MED ORDER — PREDNISONE 10 MG PO TABS: ORAL_TABLET | ORAL | Status: DC

## 2014-04-01 MED ORDER — CEFUROXIME AXETIL 500 MG PO TABS: 500.0000 mg | ORAL_TABLET | Freq: Two times a day (BID) | ORAL | Status: DC

## 2014-04-01 NOTE — Progress Notes (Signed)
Pre visit review using our clinic review tool, if applicable. No additional management support is needed unless otherwise documented below in the visit note. 

## 2014-04-01 NOTE — Progress Notes (Signed)
HPI  Pt presents to the clinic today with c/o sinus pain and pressure, nasal congestion and sore throat. She reports this started 2 weeks ago. She denies fever, but has had chills and body aches. She is blowing thick green mucous out of her nose. She continues to take the allegra and flonase. She has also tried a neti pot and sudafed without relief. She does have a history of allergies and recurrent sinusitis.  Review of Systems    Past Medical History  Diagnosis Date  . Hip fracture, left 2005    s/p ORIF  . Hypothyroidism   . Fibromyalgia   . Depression   . Allergic rhinitis   . Chronic sinusitis   . Cervical dysplasia 1990    s/p conization    Family History  Problem Relation Age of Onset  . Arthritis Mother     RA and OA  . Cancer Maternal Aunt 48    breast CA  . Colon cancer Neg Hx   . Pancreatic cancer Neg Hx   . Stomach cancer Neg Hx     History   Social History  . Marital Status: Married    Spouse Name: N/A    Number of Children: N/A  . Years of Education: N/A   Occupational History  . Not on file.   Social History Main Topics  . Smoking status: Never Smoker   . Smokeless tobacco: Never Used  . Alcohol Use: Yes     Comment: rarely  . Drug Use: No  . Sexual Activity: Not on file   Other Topics Concern  . 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    No Known Allergies   Constitutional: Positive headache, fatigue. Denies fever or abrupt weight changes.  HEENT:  Positive eye pain, pressure behind the eyes, facial pain, nasal congestion and sore throat. Denies eye redness, ear pain, ringing in the ears, wax buildup, runny nose or bloody nose. Respiratory: Positive cough. Denies difficulty breathing or shortness of breath.  Cardiovascular: Denies chest pain, chest tightness, palpitations or swelling in the hands or feet.   No other specific complaints in a complete review of systems (except as  listed in HPI above).  Objective:  BP 110/60  Pulse 72  Temp(Src) 98.3 F (36.8 C) (Oral)  Wt 156 lb 4 oz (70.875 kg)  LMP 03/23/2014   General: Appears her stated age, well developed, well nourished in NAD. HEENT: Head: normal shape and size, maxillary sinus tenderness noted; Eyes: sclera white, no icterus, conjunctiva pink, PERRLA and EOMs intact; Ears: Tm's gray and intact, normal light reflex; Nose: mucosa pink and moist, septum midline; Throat/Mouth: + PND. Teeth present, mucosa pink and moist, no exudate noted, no lesions or ulcerations noted.  Neck: Neck supple, trachea midline. No massses, lumps or thyromegaly present.  Cardiovascular: Normal rate and rhythm. S1,S2 noted.  No murmur, rubs or gallops noted. No JVD or BLE edema. No carotid bruits noted. Pulmonary/Chest: Normal effort and positive vesicular breath sounds. No respiratory distress. No wheezes, rales or ronchi noted.      Assessment & Plan:   Acute bacterial sinusitis  Ceftin BID for 10 days She would like to try pred taper instead of kenalog IM Continue allegra and flonase  RTC as needed or if symptoms persist.

## 2014-04-01 NOTE — Patient Instructions (Signed)

## 2014-04-14 ENCOUNTER — Other Ambulatory Visit: Payer: Self-pay | Admitting: *Deleted

## 2014-04-14 MED ORDER — CLONAZEPAM 0.5 MG PO TABS: 1.0000 mg | ORAL_TABLET | Freq: Every day | ORAL | Status: DC

## 2014-04-14 NOTE — Telephone Encounter (Signed)
Pt requesting medication refill. Last f/u appt 12/2013 with no future appts scheduled. pls advise 

## 2014-04-15 ENCOUNTER — Encounter: Payer: Self-pay | Admitting: Family Medicine

## 2014-04-15 NOTE — Telephone Encounter (Signed)
Rx called in to requested pharmacy 

## 2014-04-19 ENCOUNTER — Telehealth: Payer: Self-pay | Admitting: Family Medicine

## 2014-04-19 NOTE — Telephone Encounter (Signed)
Pt called stating abx prescribed for sinus infection has not helped. Pt last day on abx was Monday 8/17 and she is still feeling horrible. Brown mucus, drainage, sinus pressure and pain, losing voice, and no energy. A short round of prednisone was also prescribed and pt stated that helped while she was taking that but once it got out of her system she began to feel horrible again. Please advise. [facility]   [phone]

## 2014-04-20 ENCOUNTER — Other Ambulatory Visit: Payer: Self-pay | Admitting: Internal Medicine

## 2014-04-20 MED ORDER — DOXYCYCLINE HYCLATE 100 MG PO TABS: 100.0000 mg | ORAL_TABLET | Freq: Two times a day (BID) | ORAL | Status: DC

## 2014-04-20 NOTE — Telephone Encounter (Signed)
Will try doxycycline x 10 days. She should also be taking the flonase and Mucinex

## 2014-04-20 NOTE — Telephone Encounter (Signed)
Pt is aware and as instructed below to make sure she is using flonase and mucinex--pt states she has been using flonase but not mucinex--pt is aware

## 2014-05-30 ENCOUNTER — Other Ambulatory Visit: Payer: Self-pay

## 2014-05-30 MED ORDER — CLONAZEPAM 0.5 MG PO TABS: 1.0000 mg | ORAL_TABLET | Freq: Every day | ORAL | Status: DC

## 2014-05-30 NOTE — Telephone Encounter (Signed)
Pt left v/m requesting refill clonazepam to [facility]; pharmacy has requested x 2 since 05/26/14. Pt request cb when refilled.

## 2014-05-30 NOTE — Telephone Encounter (Signed)
Rx called in to requested pharmacy 

## 2014-06-20 ENCOUNTER — Telehealth: Payer: Self-pay | Admitting: Family Medicine

## 2014-06-20 NOTE — Telephone Encounter (Signed)
Patient Information:  Caller Name: [name]  Phone: [phone]  Patient: [name], [name]  Gender: Female  DOB: 03/06/1961  Age: 53 Years  PCP: Arnette Norris [facility])  Pregnant: No  Office Follow Up:  Does the office need to follow up with this patient?: No  Instructions For The Office: N/A   Symptoms  Reason For Call & Symptoms: Pt is calling to see if Dr. Marjory Lies would be able to call in a diuretic or prednisone for throbbing /sharp ear pain that she has had for 4 days. She does not want to come in for an appt. Pt feels based on her hx of Tinnintus that MD could prescribe something. Her next office visit is 07/05/14. IF it needs to be assessed the pt wants to call her established ENT for evalutation rather than seeing the PCP.  Reviewed Health History In EMR: Yes  Reviewed Medications In EMR: Yes  Reviewed Allergies In EMR: Yes  Reviewed Surgeries / Procedures: Yes  Date of Onset of Symptoms: 06/18/2014 OB / GYN:  LMP: Unknown  Guideline(s) Used:  Earache  Disposition Per Guideline:   Go to Office Now  Reason For Disposition Reached:   Severe earache pain  Advice Given:  N/A  Patient Refused Recommendation:  Patient Will Follow Up With Office Later  Pt would rather see her ENT for this. If she can't get in there she will call back.

## 2014-06-22 ENCOUNTER — Other Ambulatory Visit: Payer: Self-pay | Admitting: Family Medicine

## 2014-06-22 DIAGNOSIS — Z01419 Encounter for gynecological examination (general) (routine) without abnormal findings: Secondary | ICD-10-CM | POA: Insufficient documentation

## 2014-06-22 DIAGNOSIS — E039 Hypothyroidism, unspecified: Secondary | ICD-10-CM

## 2014-06-22 DIAGNOSIS — Z13220 Encounter for screening for lipoid disorders: Secondary | ICD-10-CM

## 2014-06-24 ENCOUNTER — Other Ambulatory Visit: Payer: Self-pay

## 2014-06-24 MED ORDER — THYROID 65 MG PO TABS: 65.0000 mg | ORAL_TABLET | Freq: Every day | ORAL | Status: DC

## 2014-06-24 NOTE — Telephone Encounter (Signed)
Pt request [facility] 65 mg to [facility]; spoke with pharmacy and [facility] is same med as armour thyroid; advised pt refilled and pt has cpx on 07/05/14.

## 2014-06-27 ENCOUNTER — Other Ambulatory Visit: Payer: Self-pay | Admitting: Otolaryngology

## 2014-06-28 ENCOUNTER — Encounter: Payer: Self-pay | Admitting: Radiology

## 2014-06-28 ENCOUNTER — Other Ambulatory Visit (INDEPENDENT_AMBULATORY_CARE_PROVIDER_SITE_OTHER): Payer: BC Managed Care – PPO

## 2014-06-28 ENCOUNTER — Other Ambulatory Visit: Payer: Self-pay | Admitting: Otolaryngology

## 2014-06-28 DIAGNOSIS — E039 Hypothyroidism, unspecified: Secondary | ICD-10-CM

## 2014-06-28 DIAGNOSIS — Z01419 Encounter for gynecological examination (general) (routine) without abnormal findings: Secondary | ICD-10-CM

## 2014-06-28 DIAGNOSIS — Z13220 Encounter for screening for lipoid disorders: Secondary | ICD-10-CM

## 2014-06-28 DIAGNOSIS — H9312 Tinnitus, left ear: Secondary | ICD-10-CM

## 2014-06-28 DIAGNOSIS — Z0000 Encounter for general adult medical examination without abnormal findings: Secondary | ICD-10-CM

## 2014-06-28 LAB — COMPREHENSIVE METABOLIC PANEL
ALT: 9 U/L (ref 0–35)
AST: 14 U/L (ref 0–37)
Albumin: 3.4 g/dL — ABNORMAL LOW (ref 3.5–5.2)
Alkaline Phosphatase: 46 U/L (ref 39–117)
BUN: 21 mg/dL (ref 6–23)
CO2: 19 mEq/L (ref 19–32)
Calcium: 9 mg/dL (ref 8.4–10.5)
Chloride: 109 mEq/L (ref 96–112)
Creatinine, Ser: 1 mg/dL (ref 0.4–1.2)
GFR: 61.29 mL/min (ref >60.00)
Glucose, Bld: 90 mg/dL (ref 70–99)
Potassium: 3.7 mEq/L (ref 3.5–5.1)
SODIUM: 137 meq/L (ref 135–145)
TOTAL PROTEIN: 6.8 g/dL (ref 6.0–8.3)
Total Bilirubin: 0.5 mg/dL (ref 0.2–1.2)

## 2014-06-28 LAB — CBC WITH DIFFERENTIAL/PLATELET
BASOS PCT: 0.9 % (ref 0.0–3.0)
Basophils Absolute: 0 10*3/uL (ref 0.0–0.1)
EOS PCT: 2.9 % (ref 0.0–5.0)
Eosinophils Absolute: 0.2 10*3/uL (ref 0.0–0.7)
HEMATOCRIT: 40.5 % (ref 36.0–46.0)
Hemoglobin: 13.5 g/dL (ref 12.0–15.0)
Lymphocytes Relative: 37.2 % (ref 12.0–46.0)
Lymphs Abs: 2.1 10*3/uL (ref 0.7–4.0)
MCHC: 33.5 g/dL (ref 30.0–36.0)
MCV: 91.8 fl (ref 78.0–100.0)
MONO ABS: 0.5 10*3/uL (ref 0.1–1.0)
MONOS PCT: 8.9 % (ref 3.0–12.0)
Neutro Abs: 2.8 10*3/uL (ref 1.4–7.7)
Neutrophils Relative %: 50.1 % (ref 43.0–77.0)
PLATELETS: 221 10*3/uL (ref 150.0–400.0)
RBC: 4.4 Mil/uL (ref 3.87–5.11)
RDW: 13.6 % (ref 11.5–15.5)
WBC: 5.6 10*3/uL (ref 4.0–10.5)

## 2014-06-28 LAB — LIPID PANEL
Cholesterol: 168 mg/dL (ref 0–200)
HDL: 66.4 mg/dL (ref >39.00)
LDL Cholesterol: 93 mg/dL (ref 0–99)
NonHDL: 101.6
Total CHOL/HDL Ratio: 3
Triglycerides: 42 mg/dL (ref 0.0–149.0)
VLDL: 8.4 mg/dL (ref 0.0–40.0)

## 2014-06-28 LAB — TSH: TSH: 1.2 u[IU]/mL (ref 0.35–4.50)

## 2014-06-28 LAB — T4, FREE: Free T4: 0.88 ng/dL (ref 0.60–1.60)

## 2014-06-29 ENCOUNTER — Ambulatory Visit
Admission: RE | Admit: 2014-06-29 | Discharge: 2014-06-29 | Disposition: A | Discharge status: Home / Self Care | Payer: BC Managed Care – PPO | Source: Ambulatory Visit | Attending: Otolaryngology | Admitting: Otolaryngology

## 2014-06-29 DIAGNOSIS — H9312 Tinnitus, left ear: Secondary | ICD-10-CM

## 2014-06-29 MED ORDER — GADOBENATE DIMEGLUMINE 529 MG/ML IV SOLN
14.0000 mL | Freq: Once | INTRAVENOUS | Status: AC | PRN
  Administered 2014-06-29: 14 mL via INTRAVENOUS

## 2014-07-05 ENCOUNTER — Encounter: Payer: Self-pay | Admitting: Family Medicine

## 2014-07-05 ENCOUNTER — Ambulatory Visit (INDEPENDENT_AMBULATORY_CARE_PROVIDER_SITE_OTHER): Payer: BC Managed Care – PPO | Admitting: Family Medicine

## 2014-07-05 VITALS — BP 116/72 | HR 90 | Temp 98.3°F | Ht 69.5 in | Wt 151.8 lb

## 2014-07-05 DIAGNOSIS — H9319 Tinnitus, unspecified ear: Secondary | ICD-10-CM | POA: Insufficient documentation

## 2014-07-05 DIAGNOSIS — F32A Depression, unspecified: Secondary | ICD-10-CM

## 2014-07-05 DIAGNOSIS — G43801 Other migraine, not intractable, with status migrainosus: Secondary | ICD-10-CM

## 2014-07-05 DIAGNOSIS — G4700 Insomnia, unspecified: Secondary | ICD-10-CM

## 2014-07-05 DIAGNOSIS — R490 Dysphonia: Secondary | ICD-10-CM | POA: Insufficient documentation

## 2014-07-05 DIAGNOSIS — Z0000 Encounter for general adult medical examination without abnormal findings: Secondary | ICD-10-CM

## 2014-07-05 DIAGNOSIS — Z01419 Encounter for gynecological examination (general) (routine) without abnormal findings: Secondary | ICD-10-CM

## 2014-07-05 DIAGNOSIS — F329 Major depressive disorder, single episode, unspecified: Secondary | ICD-10-CM

## 2014-07-05 NOTE — Assessment & Plan Note (Signed)
Work up negative thus far. She is frustrated with current ENT.  Will refer to another ENT for second opinion.

## 2014-07-05 NOTE — Progress Notes (Signed)
Subjective:    Patient ID: [name], female    DOB: 19-Dec-1960, 53 y.o.   MRN: [id number]  HPI  53 yo pleasant female here for CPX but wants to change to an office visit due to multiple issues today.   Hypothyroidism- On Armour 60 mg daily for hypothyroidism.  Denies any symptoms of hypo or hyperthyroidism.  Lab Results  Component Value Date   TSH 1.20 06/28/2014      Migraines- unfortunately a chronic issue and deteriorating. Having light headedness and daily headaches. Seeing Dr. Domingo Cocking [facility] and per pt, he feels dizziness is a new "classic migraine" manifestation. Still getting botox injections. Having daily headaches that are not as severe as migraines prior to receiving botox. Still having photophobia. Restarted in July- she really was resisting this because she does not like how it makes her feel.  It has since been increased to 75 mg daily.  She has noticed some improvement over past couple of weeks. CGRP.  Insomnia- started after she started botox injections. After second botox injection, did not have insomnia. Clonazepam does work.  She has cut back to 1 mg at bedtime.  Wants to also discuss tinnitus today- had MRI/MRA (ENT), Dr. Simeon Craft. Cannot seem to find cause of tinnitus.  Depression has deteriorated.  Feels hopeless, more tearful.  Denies SI or HI.  Thinks of death but states that she would never commit suicide because of her children and faith.  She never started the Wellbutrin rx I sent in for her.  She has however made an appt to see a psychotherapist next week.  Patient Active Problem List   Diagnosis Date Noted  . Tinnitus 07/05/2014  . Well woman exam 06/22/2014  . Dizziness and giddiness 12/28/2013  . Speech abnormality 08/01/2013  . Insomnia 06/30/2012  . Migraine with status migrainosus 05/23/2011  . Depression   . Hypothyroidism    Past Medical History  Diagnosis Date  . Hip fracture, left 2005    s/p ORIF  .  Hypothyroidism   . Fibromyalgia   . Depression   . Allergic rhinitis   . Chronic sinusitis   . Cervical dysplasia 1990    s/p conization   Past Surgical History  Procedure Laterality Date  . Orif hip fracture  2005    Daldorf  . Breast enhancement surgery  2007  . Tonsillectomy and adenoidectomy    . Dilation and curettage of uterus  1989   History  Substance Use Topics  . Smoking status: Never Smoker   . Smokeless tobacco: Never Used  . Alcohol Use: Yes     Comment: rarely   Family History  Problem Relation Age of Onset  . Arthritis Mother     RA and OA  . Cancer Maternal Aunt 48    breast CA  . Colon cancer Neg Hx   . Pancreatic cancer Neg Hx   . Stomach cancer Neg Hx    No Known Allergies Current Outpatient Prescriptions on File Prior to Visit  Medication Sig Dispense Refill  . baclofen (LIORESAL) 10 MG tablet as needed.     Marland Kitchen buPROPion (WELLBUTRIN XL) 150 MG 24 hr tablet Take 1 tablet (150 mg total) by mouth daily. 30 tablet 1  . cetirizine (ZYRTEC) 10 MG tablet Take 10 mg by mouth daily as needed for allergies.    . clonazePAM (KLONOPIN) 0.5 MG tablet Take 2 tablets (1 mg total) by mouth at bedtime. 60 tablet 0  .  cyanocobalamin (,VITAMIN B-12,) 1000 MCG/ML injection Inject 1 mL (1,000 mcg total) into the muscle every 30 (thirty) days. 1 mL 11  . fexofenadine (ALLEGRA) 180 MG tablet Take 180 mg by mouth as needed for allergies or rhinitis.    . fluticasone (FLONASE) 50 MCG/ACT nasal spray Place 2 sprays into the nose daily. 16 g 3  . hydrOXYzine (ATARAX/VISTARIL) 50 MG tablet Take 1 tablet (50 mg total) by mouth every 6 (six) hours as needed. 60 tablet 0  . Magnesium Citrate 100 MG TABS Take 200 mg by mouth daily.    . Melatonin 10 MG TABS Take 10 mg by mouth at bedtime.    . Multiple Vitamin (MULTIVITAMIN) capsule Take 1 capsule by mouth daily.      Marland Kitchen PRESCRIPTION MEDICATION botox injections for migraine    . SUMAtriptan (IMITREX) 50 MG tablet Take 1 tablet (50  mg total) by mouth as needed. 10 tablet 6  . thyroid (ARMOUR) 65 MG tablet Take 1 tablet (65 mg total) by mouth daily. 30 tablet 0  . topiramate (TOPAMAX) 25 MG capsule Take 25 mg by mouth daily.     No current facility-administered medications on file prior to visit.   The PMH, PSH, Social History, Family History, Medications, and allergies have been reviewed in [facility], and have been updated if relevant.   Review of Systems See HPI       Pan positive ROS- + fatigue + hoarseness of voice +headaches +depression +anxiety No difficulty swallowing +tinnitus +ear pressure +muscle aches +joint aches +insomnia Objective:   Physical Exam BP 116/72 mmHg  Pulse 90  Temp(Src) 98.3 F (36.8 C) (Oral)  Ht 5' 9.5" (1.765 m)  Wt 151 lb 12 oz (68.833 kg)  BMI 22.10 kg/m2  SpO2 97%  LMP 07/05/2014 Wt Readings from Last 3 Encounters:  07/05/14 151 lb 12 oz (68.833 kg)  06/29/14 150 lb (68.04 kg)  04/01/14 156 lb 4 oz (70.875 kg)    General:  Well-developed,well-nourished,in no acute distress; alert,appropriate and cooperative throughout examination Head:  normocephalic and atraumatic.   Eyes:  vision grossly intact, pupils equal, pupils round, and pupils reactive to light.   Ears:  R ear normal and L ear normal.   Nose:  no external deformity.   Mouth:  good dentition.   Neck:  No deformities, masses, or tenderness noted. Neurologic:  alert & oriented X3 and gait normal.   Skin:  Intact without suspicious lesions or rashes Cervical Nodes:  No lymphadenopathy noted Axillary Nodes:  No palpable lymphadenopathy Psych:  Cognition and judgment appear intact. Alert and cooperative with normal attention span and concentration. No apparent delusions, illusions, hallucinations     Assessment & Plan:

## 2014-07-05 NOTE — Patient Instructions (Addendum)
Good to see you. Please call to set up your mammogram.  Please schedule a complete physical on your way out.  We are referring you to  another ENT for a second opinion.

## 2014-07-05 NOTE — Assessment & Plan Note (Signed)
Deteriorated. >25 minutes spent in face to face time with patient, >50% spent in counselling her on her multiple concerns, or coordination of care. Agree with psychotherapy. She denies SI or HI. She is advised to go to ER if she does develop suicidal thoughts. The patient indicates understanding of these issues and agrees with the plan.

## 2014-07-05 NOTE — Assessment & Plan Note (Signed)
Followed by Dr. Domingo Cocking.  She has been frustrated but feels migraines are actually improving last couple of weeks.

## 2014-07-08 ENCOUNTER — Encounter: Payer: Self-pay | Admitting: Family Medicine

## 2014-07-12 ENCOUNTER — Encounter: Payer: Self-pay | Admitting: Family Medicine

## 2014-07-21 ENCOUNTER — Encounter: Payer: Self-pay | Admitting: Family Medicine

## 2014-07-22 ENCOUNTER — Other Ambulatory Visit: Payer: Self-pay | Admitting: *Deleted

## 2014-07-22 MED ORDER — CLONAZEPAM 0.5 MG PO TABS: 1.0000 mg | ORAL_TABLET | Freq: Every day | ORAL | Status: DC

## 2014-07-22 NOTE — Telephone Encounter (Signed)
rx called into pharmacy

## 2014-07-28 ENCOUNTER — Encounter: Payer: Self-pay | Admitting: Family Medicine

## 2014-07-28 ENCOUNTER — Other Ambulatory Visit: Payer: Self-pay | Admitting: Family Medicine

## 2014-07-28 DIAGNOSIS — R002 Palpitations: Secondary | ICD-10-CM

## 2014-08-03 ENCOUNTER — Other Ambulatory Visit: Payer: Self-pay | Admitting: *Deleted

## 2014-08-03 MED ORDER — THYROID 65 MG PO TABS: 65.0000 mg | ORAL_TABLET | Freq: Every day | ORAL | Status: DC

## 2014-08-10 ENCOUNTER — Ambulatory Visit (INDEPENDENT_AMBULATORY_CARE_PROVIDER_SITE_OTHER): Payer: BC Managed Care – PPO | Admitting: Family Medicine

## 2014-08-10 ENCOUNTER — Other Ambulatory Visit (HOSPITAL_COMMUNITY)
Admission: RE | Admit: 2014-08-10 | Discharge: 2014-08-10 | Disposition: A | Discharge status: Home / Self Care | Payer: BC Managed Care – PPO | Source: Ambulatory Visit | Attending: Family Medicine | Admitting: Family Medicine

## 2014-08-10 ENCOUNTER — Encounter: Payer: Self-pay | Admitting: Family Medicine

## 2014-08-10 VITALS — BP 120/72 | HR 74 | Temp 97.8°F | Ht 69.5 in | Wt 149.5 lb

## 2014-08-10 DIAGNOSIS — Z113 Encounter for screening for infections with a predominantly sexual mode of transmission: Secondary | ICD-10-CM | POA: Insufficient documentation

## 2014-08-10 DIAGNOSIS — H9319 Tinnitus, unspecified ear: Secondary | ICD-10-CM

## 2014-08-10 DIAGNOSIS — N760 Acute vaginitis: Secondary | ICD-10-CM | POA: Insufficient documentation

## 2014-08-10 DIAGNOSIS — H6980 Other specified disorders of Eustachian tube, unspecified ear: Secondary | ICD-10-CM

## 2014-08-10 DIAGNOSIS — E039 Hypothyroidism, unspecified: Secondary | ICD-10-CM

## 2014-08-10 DIAGNOSIS — Z23 Encounter for immunization: Secondary | ICD-10-CM

## 2014-08-10 DIAGNOSIS — Z01419 Encounter for gynecological examination (general) (routine) without abnormal findings: Secondary | ICD-10-CM | POA: Diagnosis not present

## 2014-08-10 DIAGNOSIS — F32A Depression, unspecified: Secondary | ICD-10-CM

## 2014-08-10 DIAGNOSIS — F329 Major depressive disorder, single episode, unspecified: Secondary | ICD-10-CM

## 2014-08-10 DIAGNOSIS — G43801 Other migraine, not intractable, with status migrainosus: Secondary | ICD-10-CM

## 2014-08-10 DIAGNOSIS — Z1151 Encounter for screening for human papillomavirus (HPV): Secondary | ICD-10-CM | POA: Insufficient documentation

## 2014-08-10 DIAGNOSIS — N920 Excessive and frequent menstruation with regular cycle: Secondary | ICD-10-CM

## 2014-08-10 DIAGNOSIS — E538 Deficiency of other specified B group vitamins: Secondary | ICD-10-CM

## 2014-08-10 DIAGNOSIS — Z0000 Encounter for general adult medical examination without abnormal findings: Secondary | ICD-10-CM

## 2014-08-10 LAB — VITAMIN B12: Vitamin B-12: >1500 pg/mL — ABNORMAL HIGH (ref 211–911)

## 2014-08-10 MED ORDER — CYANOCOBALAMIN 1000 MCG/ML IJ SOLN
1000.0000 ug | Freq: Once | INTRAMUSCULAR | Status: AC
  Administered 2014-08-10: 1000 ug via INTRAMUSCULAR

## 2014-08-10 NOTE — Progress Notes (Signed)
Pre visit review using our clinic review tool, if applicable. No additional management support is needed unless otherwise documented below in the visit note. 

## 2014-08-10 NOTE — Patient Instructions (Signed)
Great to see you. We will call you with your results from today. I am sorry you are going through such a tough time- hang in there.

## 2014-08-10 NOTE — Assessment & Plan Note (Signed)
Reviewed preventive care protocols, scheduled due services, and updated immunizations Discussed nutrition, exercise, diet, and healthy lifestyle.  Influenza vaccine given today. Pap smear today.

## 2014-08-10 NOTE — Assessment & Plan Note (Signed)
Deteriorated. She had several questions for me about ETD and treatment with tubes. Answered what I could but I advised asking ENT about procedure details.

## 2014-08-10 NOTE — Progress Notes (Signed)
Subjective:    Patient ID: [name], female    DOB: [birth year], 53 y.o.   MRN: [id number]  HPI  53 yo pleasant female here for CPX.  G2P2- last pap smear was 02/2012 - done by me.  Was normal. S/p conization for cervical dysplasia in 1990.  Denies any vaginal complaints.   Last mammogram 07/02/13  Periods are heavier now but still regular- triggering more migraines.  Colonoscopy 07/13/13- Dr. Hilarie Fredrickson- 10 year recall-  No changes in bowel habits or blood in her stool.  Due for mammogram.  Hypothyroidism- On Armour 60 mg daily for hypothyroidism.  Denies any symptoms of hypo or hyperthyroidism.  Lab Results  Component Value Date   TSH 1.20 06/28/2014    Lab Results  Component Value Date   CHOL 168 06/28/2014   HDL 66.40 06/28/2014   LDLCALC 93 06/28/2014   TRIG 42.0 06/28/2014   CHOLHDL 3 06/28/2014   Migraines-  deteriorated. Sees Dr. Domingo Cocking. Having a very rough couple of months- multiple migraines, sometimes lasting 8 days at a time.  Insomnia- started after she started botox injections. After second botox injection, did not have insomnia. Clonazepam does work.  She has cut back to 1 mg at bedtime.  ETD/Tinnitus- saw new ENT, Dr. Laroy Apple.  Per pt, good visit.  Just saw him last week.  Thought hoarseness may be due to silent reflux. He also advised tubs for her ETD.  She is considering this.  Patient Active Problem List   Diagnosis Date Noted  . Tinnitus 07/05/2014  . Hoarseness 07/05/2014  . Well woman exam 06/22/2014  . Dizziness and giddiness 12/28/2013  . Speech abnormality 08/01/2013  . Insomnia 06/30/2012  . Migraine with status migrainosus 05/23/2011  . Depression   . Hypothyroidism    Past Medical History  Diagnosis Date  . Hip fracture, left 2005    s/p ORIF  . Hypothyroidism   . Fibromyalgia   . Depression   . Allergic rhinitis   . Chronic sinusitis   . Cervical dysplasia 1990    s/p conization   Past Surgical History  Procedure Laterality  Date  . Orif hip fracture  2005    Daldorf  . Breast enhancement surgery  2007  . Tonsillectomy and adenoidectomy    . Dilation and curettage of uterus  1989   History  Substance Use Topics  . Smoking status: Never Smoker   . Smokeless tobacco: Never Used  . Alcohol Use: Yes     Comment: rarely   Family History  Problem Relation Age of Onset  . Arthritis Mother     RA and OA  . Cancer Maternal Aunt 48    breast CA  . Colon cancer Neg Hx   . Pancreatic cancer Neg Hx   . Stomach cancer Neg Hx    No Known Allergies Current Outpatient Prescriptions on File Prior to Visit  Medication Sig Dispense Refill  . baclofen (LIORESAL) 10 MG tablet as needed.     . cetirizine (ZYRTEC) 10 MG tablet Take 10 mg by mouth daily as needed for allergies.    . clonazePAM (KLONOPIN) 0.5 MG tablet Take 2 tablets (1 mg total) by mouth at bedtime. 60 tablet 0  . cyanocobalamin (,VITAMIN B-12,) 1000 MCG/ML injection Inject 1 mL (1,000 mcg total) into the muscle every 30 (thirty) days. 1 mL 11  . fexofenadine (ALLEGRA) 180 MG tablet Take 180 mg by mouth as needed for allergies or rhinitis.    . fluticasone (  FLONASE) 50 MCG/ACT nasal spray Place 2 sprays into the nose daily. 16 g 3  . hydrOXYzine (ATARAX/VISTARIL) 50 MG tablet Take 1 tablet (50 mg total) by mouth every 6 (six) hours as needed. 60 tablet 0  . Magnesium Citrate 100 MG TABS Take 200 mg by mouth daily.    . Melatonin 10 MG TABS Take 10 mg by mouth at bedtime.    . Multiple Vitamin (MULTIVITAMIN) capsule Take 1 capsule by mouth daily.      Marland Kitchen PRESCRIPTION MEDICATION botox injections for migraine    . SUMAtriptan (IMITREX) 50 MG tablet Take 1 tablet (50 mg total) by mouth as needed. 10 tablet 6  . thyroid (ARMOUR) 65 MG tablet Take 1 tablet (65 mg total) by mouth daily. 30 tablet 10  . topiramate (TOPAMAX) 25 MG capsule Take 25 mg by mouth daily.     No current facility-administered medications on file prior to visit.   The PMH, PSH, Social  History, Family History, Medications, and allergies have been reviewed in [facility], and have been updated if relevant.   Review of Systems See HPI +HA +photophobia, phonophobia +fatigue +tinnitus +decreased appetite but weight stable Wt Readings from Last 3 Encounters:  08/10/14 149 lb 8 oz (67.813 kg)  07/05/14 151 lb 12 oz (68.833 kg)  06/29/14 150 lb (68.04 kg)  No CP or SOB No LE edema No rash + intermittent palpitations- has cardiology referral Objective:   Physical Exam BP 120/72 mmHg  Pulse 74  Temp(Src) 97.8 F (36.6 C) (Oral)  Ht 5' 9.5" (1.765 m)  Wt 149 lb 8 oz (67.813 kg)  BMI 21.77 kg/m2  SpO2 97%  LMP 07/30/2014 Wt Readings from Last 3 Encounters:  08/10/14 149 lb 8 oz (67.813 kg)  07/05/14 151 lb 12 oz (68.833 kg)  06/29/14 150 lb (68.04 kg)  General:  Well-developed,well-nourished,in no acute distress; alert,appropriate and cooperative throughout examination Head:  normocephalic and atraumatic.   Eyes:  vision grossly intact, pupils equal, pupils round, and pupils reactive to light.   Ears:  R ear normal and L ear normal.   Nose:  no external deformity.   Mouth:  good dentition.   Neck:  No deformities, masses, or tenderness noted. Breasts:  +implants bilaterally  No mass, nodules, thickening, tenderness, bulging, retraction, inflamation, nipple discharge or skin changes noted.   Lungs:  Normal respiratory effort, chest expands symmetrically. Lungs are clear to auscultation, no crackles or wheezes. Heart:  Normal rate and regular rhythm. S1 and S2 normal without gallop, murmur, click, rub or other extra sounds. Abdomen:  Bowel sounds positive,abdomen soft and non-tender without masses, organomegaly or hernias noted. Rectal:  no external abnormalities.   Genitalia:  Pelvic Exam:        External: normal female genitalia without lesions or masses        Vagina: normal without lesions or masses        Cervix: normal without lesions or masses        Adnexa:  normal bimanual exam without masses or fullness        Uterus: normal by palpation        Pap smear: performed Msk:  No deformity or scoliosis noted of thoracic or lumbar spine.   Extremities:  No clubbing, cyanosis, edema, or deformity noted with normal full range of motion of all joints.   Neurologic:  alert & oriented X3 and gait normal.   Skin:  Intact without suspicious lesions or rashes Cervical Nodes:  No  lymphadenopathy noted Axillary Nodes:  No palpable lymphadenopathy Psych:  Cognition and judgment appear intact. Alert and cooperative with normal attention span and concentration. No apparent delusions, illusions, hallucinations    Assessment & Plan:

## 2014-08-10 NOTE — Assessment & Plan Note (Signed)
Deteriorated. Spent a great deal of time talking with [name].  She is having a difficult time, unfortunately.  She questions if her heavy periods are triggering migraines.  I advised talking with her GYN- ? If ablation would help.

## 2014-08-11 ENCOUNTER — Encounter: Payer: Self-pay | Admitting: Family Medicine

## 2014-08-11 LAB — RPR

## 2014-08-11 LAB — CYTOLOGY - PAP

## 2014-08-11 LAB — HIV ANTIBODY (ROUTINE TESTING W REFLEX): HIV 1&2 Ab, 4th Generation: NONREACTIVE

## 2014-08-12 ENCOUNTER — Encounter: Payer: Self-pay | Admitting: *Deleted

## 2014-08-12 LAB — CERVICOVAGINAL ANCILLARY ONLY: Candida vaginitis: NEGATIVE

## 2014-08-15 LAB — CERVICOVAGINAL ANCILLARY ONLY: Herpes: NEGATIVE

## 2014-08-17 ENCOUNTER — Encounter: Payer: Self-pay | Admitting: Cardiovascular Disease

## 2014-08-17 ENCOUNTER — Ambulatory Visit (INDEPENDENT_AMBULATORY_CARE_PROVIDER_SITE_OTHER): Payer: BC Managed Care – PPO | Admitting: Cardiovascular Disease

## 2014-08-17 VITALS — BP 100/64 | HR 76 | Ht 70.0 in | Wt 151.5 lb

## 2014-08-17 DIAGNOSIS — R42 Dizziness and giddiness: Secondary | ICD-10-CM

## 2014-08-17 DIAGNOSIS — R000 Tachycardia, unspecified: Secondary | ICD-10-CM | POA: Insufficient documentation

## 2014-08-17 DIAGNOSIS — R0602 Shortness of breath: Secondary | ICD-10-CM

## 2014-08-17 DIAGNOSIS — G43801 Other migraine, not intractable, with status migrainosus: Secondary | ICD-10-CM

## 2014-08-17 MED ORDER — ATENOLOL 25 MG PO TABS: 25.0000 mg | ORAL_TABLET | Freq: Every day | ORAL | Status: DC | PRN

## 2014-08-17 NOTE — Patient Instructions (Addendum)
Please check with neurology about using atenolol If Ok,  take atenolol 1/2 pill as needed for tachycardia  We will order a stress test, Echocardiogram for tachycardia 30 day monitor for tachycardia  Please call us if you have new issues that need to be addressed before your next appt.  Follow up in 5 to 6 weeks

## 2014-08-17 NOTE — Patient Instructions (Signed)
Dr. Rockey Situ will review your stress test results We will call you with the results

## 2014-08-17 NOTE — Assessment & Plan Note (Signed)
Symptoms relatively well controlled on Topamax As mentioned, could use low dose atenolol for tachycardia if blood pressure tolerates

## 2014-08-17 NOTE — Progress Notes (Signed)
Patient ID: [name], female    DOB: 1961/02/21, 53 y.o.   MRN: [id number]  HPI Comments: [name] is a 53 year old woman with history of chronic migraines who presents for symptoms of lightheadedness, tachycardia.  She reports that symptoms started in April 2015 with lightheadedness. Initially would present with exercise, now seems to be getting worse, presenting with any exertion such as climbing stairs. She reports having tachycardia with minimal exertion to the point where she has to limit what she does. She reports that her son plays football for [employer] and she is afraid of walking long distances into the stadium as she has symptoms of tachycardia. For her lightheadedness, she typically has to sit down until symptoms resolve. If she raises from a supine position, often has symptoms. Previously used to work out on a regular basis, does not do so anymore  She reports that she does not eat very well and when she eats she eats "poorly". Often missing meals, losing weight. She reports that Topamax also has been causing her to lose weight. She feels that she has lost 10 pounds.. Topamax seems to be helping with her migraines.  EKG on today's visit shows normal sinus rhythm with rate 76 bpm, no significant ST or T-wave changes   No Known Allergies  Outpatient Encounter Prescriptions as of 08/17/2014  Medication Sig  . baclofen (LIORESAL) 10 MG tablet as needed.   . cetirizine (ZYRTEC) 10 MG tablet Take 10 mg by mouth daily as needed for allergies.  . clonazePAM (KLONOPIN) 0.5 MG tablet Take 2 tablets (1 mg total) by mouth at bedtime.  . cyanocobalamin (,VITAMIN B-12,) 1000 MCG/ML injection Inject 1 mL (1,000 mcg total) into the muscle every 30 (thirty) days.  . hydrOXYzine (ATARAX/VISTARIL) 50 MG tablet Take 1 tablet (50 mg total) by mouth every 6 (six) hours as needed.  . Magnesium Citrate 100 MG TABS Take 200 mg by mouth daily.  . Melatonin 10 MG TABS Take 10 mg by mouth at bedtime.   . Multiple Vitamin (MULTIVITAMIN) capsule Take 1 capsule by mouth daily.    . pantoprazole (PROTONIX) 40 MG tablet Take 40 mg by mouth 2 (two) times daily.  Marland Kitchen PRESCRIPTION MEDICATION botox injections for migraine  . SUMAtriptan (IMITREX) 50 MG tablet Take 1 tablet (50 mg total) by mouth as needed.  . thyroid (ARMOUR) 65 MG tablet Take 1 tablet (65 mg total) by mouth daily.  Marland Kitchen topiramate (TOPAMAX) 25 MG capsule Take 25 mg by mouth daily.  Marland Kitchen atenolol (TENORMIN) 25 MG tablet Take 1 tablet (25 mg total) by mouth daily as needed.  . [DISCONTINUED] fexofenadine (ALLEGRA) 180 MG tablet Take 180 mg by mouth as needed for allergies or rhinitis.  . [DISCONTINUED] fluticasone (FLONASE) 50 MCG/ACT nasal spray Place 2 sprays into the nose daily. (Patient not taking: Reported on 08/17/2014)    Past Medical History  Diagnosis Date  . Hip fracture, left 2005    s/p ORIF  . Hypothyroidism   . Fibromyalgia   . Depression   . Allergic rhinitis   . Chronic sinusitis   . Cervical dysplasia 1990    s/p conization  . Migraine     Past Surgical History  Procedure Laterality Date  . Orif hip fracture  2005    Daldorf  . Breast enhancement surgery  2007  . Tonsillectomy and adenoidectomy    . Dilation and curettage of uterus  1989    Social History  reports that she has never  smoked. She has never used smokeless tobacco. She reports that she drinks alcohol. She reports that she does not use illicit drugs.  Family History family history includes Arthritis in her mother; Cancer (age of onset: 51) in her maternal aunt. There is no history of Colon cancer, Pancreatic cancer, or Stomach cancer.      Review of Systems  Constitutional: Negative.   Respiratory: Negative.   Cardiovascular: Positive for palpitations.       Tachycardia  Gastrointestinal: Negative.   Musculoskeletal: Negative.   Skin: Negative.   Neurological: Positive for dizziness.  Hematological: Negative.    Psychiatric/Behavioral: The patient is nervous/anxious.   All other systems reviewed and are negative.  BP 100/64 mmHg  Pulse 76  Ht 5\' 10"  (1.778 m)  Wt 151 lb 8 oz (68.72 kg)  BMI 21.74 kg/m2  LMP 07/30/2014   Physical Exam  Constitutional: She is oriented to person, place, and time. She appears well-developed and well-nourished.  Thin  HENT:  Head: Normocephalic.  Nose: Nose normal.  Mouth/Throat: Oropharynx is clear and moist.  Eyes: Conjunctivae are normal. Pupils are equal, round, and reactive to light.  Neck: Normal range of motion. Neck supple. No JVD present.  Cardiovascular: Normal rate, regular rhythm, S1 normal, S2 normal, normal heart sounds and intact distal pulses.  Exam reveals no gallop and no friction rub.   No murmur heard. Pulmonary/Chest: Effort normal and breath sounds normal. No respiratory distress. She has no wheezes. She has no rales. She exhibits no tenderness.  Abdominal: Soft. Bowel sounds are normal. She exhibits no distension. There is no tenderness.  Musculoskeletal: Normal range of motion. She exhibits no edema or tenderness.  Lymphadenopathy:    She has no cervical adenopathy.  Neurological: She is alert and oriented to person, place, and time. Coordination normal.  Skin: Skin is warm and dry. No rash noted. No erythema.  Psychiatric: She has a normal mood and affect. Her behavior is normal. Judgment and thought content normal.    Assessment and Plan  Nursing note and vitals reviewed.

## 2014-08-17 NOTE — Procedures (Signed)
Exercise Treadmill Test  Treadmill ordered for recent epsiodes of chest pain.  Resting EKG shows NSR with rate of 81 bpm, diffuse T-wave abnormality Resting blood pressure 99/75 Stand bruce protocal was used. Patient exercised for 7 min 00 sec,  Peak heart rate of 160 bpm.  This was 94% of the maximum predicted heart rate (169) Achieved 8.5 METS No symptoms of chest pain or lightheadedness were reported at peak stress or in recovery.  She did report symptoms of tachycardia Peak Blood pressure recorded was 134/69 Heart rate at 3 minutes in recovery was 85 bpm. No ST changes concerning for ischemia  FINAL IMPRESSION: Normal exercise stress test.   No significant EKG changes concerning for ischemia. Good exercise tolerance. She did have symptoms of tachycardia though had an appropriate chronotropic response to exercise

## 2014-08-17 NOTE — Assessment & Plan Note (Signed)
Symptoms started several months ago. Etiology not clear. Blood pressure is running low, possibly exacerbated by weight loss. She is not eating well, also reports that Topamax causes weight loss. Recommended a well-balanced diet and strongly recommended that she not lose more weight. She does have orthostatic symptoms. Recommended she stay well-hydrated and not avoid salt intake. If hypotension its worse or appears to be the main issue, she may be a candidate for Florinef every other day or daily, even midodrine when necessary Treadmill, echo, 30 day monitor has been ordered for symptoms and possible arrhythmia

## 2014-08-17 NOTE — Assessment & Plan Note (Addendum)
Treadmill done on today's visit showed no arrhythmia. No concern of ischemia. Overall she handled the test relatively well but felt she was having tachycardia. Heart rate was reasonably appropriate for her level of exercise. So far no contraindication to restarting her exercise program. Echocardiogram has been ordered to rule out structural heart disease 30 day monitor has been ordered for symptoms of paroxysmal tachycardia  We did discuss various treatment options for her tachycardia. One option would be to try low-dose atenolol if her blood pressure will tolerate. Prescription provided for atenolol 25 mg and we have recommended she cut this in half and only take this starting at nighttime if needed for tachycardia. Other option would be to use propranolol low-dose.

## 2014-08-18 ENCOUNTER — Encounter: Payer: Self-pay | Admitting: Family Medicine

## 2014-08-23 ENCOUNTER — Ambulatory Visit: Payer: BC Managed Care – PPO | Admitting: Cardiovascular Disease

## 2014-09-02 ENCOUNTER — Other Ambulatory Visit: Payer: Self-pay | Admitting: *Deleted

## 2014-09-02 ENCOUNTER — Telehealth: Payer: Self-pay | Admitting: Cardiovascular Disease

## 2014-09-02 MED ORDER — CLONAZEPAM 0.5 MG PO TABS: 1.0000 mg | ORAL_TABLET | Freq: Every day | ORAL | Status: DC

## 2014-09-02 NOTE — Telephone Encounter (Signed)
Pt requesting medication refill. Last f/u appt 07/2014-CPE. pls advise

## 2014-09-02 NOTE — Telephone Encounter (Signed)
Rx called in to requested pharmacy 

## 2014-09-02 NOTE — Telephone Encounter (Signed)
Pt calling asking since she is having problems with the monitor, that if we have enough readings on her monitor thus far can she stop.  Her problems so far with the monitor are that something is not working and she called the company and they will send new cables, which will take 3-5 days and she is still wearing the old one, but pt thinks it may need to be changed out and they may give her a new one. In other words this whole thing may take 6-10 days until this is all taken care of. Pt is just wanting to know can she just use what has already been recorded or should she wait until the monitor stuff arrive. Please advise patient.

## 2014-09-05 NOTE — Telephone Encounter (Signed)
rececieved fat that pt notified [employer] that she does not want to wear monitor anymore and they are in the process of recovering monitor.

## 2014-09-05 NOTE — Telephone Encounter (Signed)
We have been seeing tachycardia, very elevated heart rates, on the monitor that she was wearing Would consider starting atenolol 25 mg daily in the morning to see if this will help slow down her heart rate Blood pressure will usually drop when she has significant tachycardia as monitored She might feel better if we can slow her heart rate down Would continue to stay very hydrated

## 2014-09-05 NOTE — Telephone Encounter (Signed)
Spoke w/ pt.  Advised her that Dr. Rockey Situ requests more data and advised pt to wear monitor for the full 30 days.  She reports that she called [employer] to end the study but has not yet mailed the monitor back.  She states that she will call and see if she can resume the study for the full 30 days and if they will send her sensitive skins leads, as they are causing some skin discomfort.  She states that Dr. Rockey Situ had initially offered her the choice of 48 hr or 30 day monitor and that "he's gotten more info on that he would have if I had asked for the 2 day". She would like to know if this is ok in the event that [employer] does not "cooperate" w/ her.

## 2014-09-06 NOTE — Telephone Encounter (Signed)
Spoke w/ pt.  Advised her of Dr. Donivan Scull recommendation.  She verbalizes understanding, but states that she does not want to "go on record as having a heart medication" and declines atenolol at this time. She states that the tachy episodes do not bother her and that she will only start a med if Dr. Rockey Situ feels that she is in danger.   She reports that she contacted [employer] and she will be wearing her monitor for the full 30 days.  They are sending new cables that should arrive by Friday. Pt states that she was doing some research on her Botox that she receives for her migraines, and the side effects include SOB and tachycardia. She reports that she receives "a lot of Botox, all over my head, neck & traps." She is concerned that this may my causing systemic problems and adversely affecting her cardiovascular system.  She asks that I mention this to Dr. Rockey Situ and get his opinion, as Botox is the only thing that keeps her migraines from being debilitating.

## 2014-09-06 NOTE — Telephone Encounter (Signed)
Left message for pt to call back  °

## 2014-09-08 ENCOUNTER — Other Ambulatory Visit (INDEPENDENT_AMBULATORY_CARE_PROVIDER_SITE_OTHER): Payer: BLUE CROSS/BLUE SHIELD

## 2014-09-08 ENCOUNTER — Other Ambulatory Visit: Payer: Self-pay

## 2014-09-08 DIAGNOSIS — R000 Tachycardia, unspecified: Secondary | ICD-10-CM

## 2014-09-08 DIAGNOSIS — R0602 Shortness of breath: Secondary | ICD-10-CM

## 2014-09-08 DIAGNOSIS — R42 Dizziness and giddiness: Secondary | ICD-10-CM

## 2014-09-13 ENCOUNTER — Ambulatory Visit (INDEPENDENT_AMBULATORY_CARE_PROVIDER_SITE_OTHER): Payer: BLUE CROSS/BLUE SHIELD

## 2014-09-13 ENCOUNTER — Other Ambulatory Visit: Payer: Self-pay

## 2014-09-13 DIAGNOSIS — R42 Dizziness and giddiness: Secondary | ICD-10-CM

## 2014-09-13 DIAGNOSIS — R000 Tachycardia, unspecified: Secondary | ICD-10-CM

## 2014-09-13 DIAGNOSIS — R0602 Shortness of breath: Secondary | ICD-10-CM

## 2014-09-26 ENCOUNTER — Ambulatory Visit: Payer: BC Managed Care – PPO | Admitting: Cardiovascular Disease

## 2014-10-03 ENCOUNTER — Encounter: Payer: Self-pay | Admitting: Family Medicine

## 2014-10-03 MED ORDER — THYROID 65 MG PO TABS: 65.0000 mg | ORAL_TABLET | Freq: Every day | ORAL | Status: DC

## 2014-10-06 ENCOUNTER — Other Ambulatory Visit: Payer: Self-pay | Admitting: *Deleted

## 2014-10-06 NOTE — Telephone Encounter (Signed)
Last f/u appt 07/2014-[facility]

## 2014-10-07 MED ORDER — CLONAZEPAM 0.5 MG PO TABS: 1.0000 mg | ORAL_TABLET | Freq: Every day | ORAL | Status: DC

## 2014-10-07 NOTE — Telephone Encounter (Signed)
Rx called in to requested pharmacy 

## 2014-10-07 NOTE — Telephone Encounter (Signed)
Ok to phone in clonazepam 

## 2014-10-10 ENCOUNTER — Ambulatory Visit (INDEPENDENT_AMBULATORY_CARE_PROVIDER_SITE_OTHER): Payer: BLUE CROSS/BLUE SHIELD | Admitting: Cardiovascular Disease

## 2014-10-10 ENCOUNTER — Encounter: Payer: Self-pay | Admitting: Cardiovascular Disease

## 2014-10-10 VITALS — BP 100/82 | HR 83 | Ht 70.0 in | Wt 150.2 lb

## 2014-10-10 DIAGNOSIS — R42 Dizziness and giddiness: Secondary | ICD-10-CM

## 2014-10-10 DIAGNOSIS — R000 Tachycardia, unspecified: Secondary | ICD-10-CM

## 2014-10-10 DIAGNOSIS — R0602 Shortness of breath: Secondary | ICD-10-CM

## 2014-10-10 NOTE — Progress Notes (Signed)
Patient ID: PAITYN BALSAM, female    DOB: 07-Jan-1961, 54 y.o.   MRN: [id number]  HPI Comments: Ms. Gear is a 54 year old woman with history of chronic migraines, previously presenting with symptoms of lightheadedness, tachycardia. She presents today for follow-up after 30 day monitor, echocardiogram, stress testing  She reports that she continues to have tachycardia with stairs predominantly. Echocardiogram was essentially normal. 30 day monitor showed sinus tachycardia with stairs as well as other exertion Stress testing showed no ischemia or EKG changes concerning for arrhythmia  She stopped working out 3 years ago and is concerned about restarting her workout. Weight continues to be stable. She's not particularly interested in starting medications at this time Blood pressure continues to run borderline low  EKG on today's visit shows normal sinus rhythm with rate 83 bpm, no significant ST or T-wave changes  Other past medical history  symptoms started in April 2015 with lightheadedness. Initially would present with exercise, now seems to be getting worse, presenting with any exertion such as climbing stairs. She reports having tachycardia with minimal exertion to the point where she has to limit what she does. She reports that her son plays football for [employer] and she is afraid of walking long distances into the stadium as she has symptoms of tachycardia. For her lightheadedness, she typically has to sit down until symptoms resolve. If she raises from a supine position, often has symptoms. Previously used to work out on a regular basis, does not do so anymore  Previously reported eating "poorly". Often missing meals, losing weight. She reports that Topamax also has been causing her to lose weight. She feels that she has lost 10 pounds.. Topamax seems to be helping with her migraines.   No Known Allergies  Outpatient Encounter Prescriptions as of 10/10/2014  Medication Sig  .  baclofen (LIORESAL) 10 MG tablet as needed.   . cetirizine (ZYRTEC) 10 MG tablet Take 10 mg by mouth daily as needed for allergies.  . clonazePAM (KLONOPIN) 0.5 MG tablet Take 2 tablets (1 mg total) by mouth at bedtime.  . cyanocobalamin (,VITAMIN B-12,) 1000 MCG/ML injection Inject 1 mL (1,000 mcg total) into the muscle every 30 (thirty) days.  . hydrOXYzine (ATARAX/VISTARIL) 50 MG tablet Take 1 tablet (50 mg total) by mouth every 6 (six) hours as needed.  . Magnesium Citrate 100 MG TABS Take 200 mg by mouth daily.  . Melatonin 10 MG TABS Take 10 mg by mouth at bedtime.  . Multiple Vitamin (MULTIVITAMIN) capsule Take 1 capsule by mouth daily.    . pantoprazole (PROTONIX) 40 MG tablet Take 40 mg by mouth 2 (two) times daily.  Marland Kitchen PRESCRIPTION MEDICATION botox injections for migraine  . SUMAtriptan (IMITREX) 50 MG tablet Take 1 tablet (50 mg total) by mouth as needed.  . thyroid (ARMOUR) 65 MG tablet Take 1 tablet (65 mg total) by mouth daily.  Marland Kitchen topiramate (TOPAMAX) 25 MG capsule Take 25 mg by mouth daily.  . [DISCONTINUED] atenolol (TENORMIN) 25 MG tablet Take 1 tablet (25 mg total) by mouth daily as needed. (Patient not taking: Reported on 10/10/2014)    Past Medical History  Diagnosis Date  . Hip fracture, left 2005    s/p ORIF  . Hypothyroidism   . Fibromyalgia   . Depression   . Allergic rhinitis   . Chronic sinusitis   . Cervical dysplasia 1990    s/p conization  . Migraine     Past Surgical History  Procedure Laterality Date  .  Orif hip fracture  2005    Daldorf  . Breast enhancement surgery  2007  . Tonsillectomy and adenoidectomy    . Dilation and curettage of uterus  1989    Social History  reports that she has never smoked. She has never used smokeless tobacco. She reports that she drinks alcohol. She reports that she does not use illicit drugs.  Family History family history includes Arthritis in her mother; Cancer (age of onset: 60) in her maternal aunt. There is  no history of Colon cancer, Pancreatic cancer, or Stomach cancer.   Review of Systems  Constitutional: Negative.   Respiratory: Negative.   Cardiovascular: Positive for palpitations.       Tachycardia  Gastrointestinal: Negative.   Musculoskeletal: Negative.   Skin: Negative.   Neurological: Positive for dizziness.  Hematological: Negative.   Psychiatric/Behavioral: The patient is nervous/anxious.   All other systems reviewed and are negative.  BP 100/82 mmHg  Pulse 83  Ht 5\' 10"  (1.778 m)  Wt 150 lb 4 oz (68.153 kg)  BMI 21.56 kg/m2   Physical Exam  Constitutional: She is oriented to person, place, and time. She appears well-developed and well-nourished.  Thin  HENT:  Head: Normocephalic.  Nose: Nose normal.  Mouth/Throat: Oropharynx is clear and moist.  Eyes: Conjunctivae are normal. Pupils are equal, round, and reactive to light.  Neck: Normal range of motion. Neck supple. No JVD present.  Cardiovascular: Normal rate, regular rhythm, S1 normal, S2 normal, normal heart sounds and intact distal pulses.  Exam reveals no gallop and no friction rub.   No murmur heard. Pulmonary/Chest: Effort normal and breath sounds normal. No respiratory distress. She has no wheezes. She has no rales. She exhibits no tenderness.  Abdominal: Soft. Bowel sounds are normal. She exhibits no distension. There is no tenderness.  Musculoskeletal: Normal range of motion. She exhibits no edema or tenderness.  Lymphadenopathy:    She has no cervical adenopathy.  Neurological: She is alert and oriented to person, place, and time. Coordination normal.  Skin: Skin is warm and dry. No rash noted. No erythema.  Psychiatric: She has a normal mood and affect. Her behavior is normal. Judgment and thought content normal.    Assessment and Plan  Nursing note and vitals reviewed.

## 2014-10-10 NOTE — Assessment & Plan Note (Signed)
Recommended she stay hydrated given her low blood pressure. Avoid further weight loss

## 2014-10-10 NOTE — Assessment & Plan Note (Addendum)
Recent normal echocardiogram, treadmill stress test discussed with her. 30 day monitor also discussed with her, did show sinus tachycardia with heavy exertion such as stairs. She's not interested at this time in daily medications. We did offer propranolol as needed. She prefers to start a low-grade exercise program with slow increase in intensity. If she continues to have significant symptoms despite improvement in her conditioning, she'll call he office for propranolol 10 mg to be taken as needed

## 2014-10-10 NOTE — Patient Instructions (Signed)
You are doing well. No medication changes were made.  Start low grade exercise, work up slowly  Light weight ok  If tachycardia continues to be an issue, Call the office for propranolol to take as needed (10 mg, 20 mg)  Please call us if you have new issues that need to be addressed before your next appt.

## 2014-10-28 ENCOUNTER — Other Ambulatory Visit: Payer: Self-pay | Admitting: *Deleted

## 2014-10-28 MED ORDER — SUMATRIPTAN SUCCINATE 50 MG PO TABS: 50.0000 mg | ORAL_TABLET | ORAL | Status: DC | PRN

## 2014-11-29 ENCOUNTER — Other Ambulatory Visit: Payer: Self-pay | Admitting: *Deleted

## 2014-11-29 MED ORDER — CLONAZEPAM 0.5 MG PO TABS: 1.0000 mg | ORAL_TABLET | Freq: Every day | ORAL | Status: DC

## 2014-11-29 NOTE — Telephone Encounter (Signed)
Called to [facility].

## 2014-11-29 NOTE — Telephone Encounter (Signed)
Last office visit 08/10/2014.  Last refilled 10/07/2014.  Ok to refill?

## 2014-12-06 ENCOUNTER — Encounter: Payer: Self-pay | Admitting: Family Medicine

## 2014-12-06 ENCOUNTER — Other Ambulatory Visit: Payer: Self-pay | Admitting: Family Medicine

## 2014-12-06 DIAGNOSIS — G43811 Other migraine, intractable, with status migrainosus: Secondary | ICD-10-CM

## 2014-12-29 ENCOUNTER — Encounter: Payer: BLUE CROSS/BLUE SHIELD | Attending: Family Medicine | Admitting: Dietician

## 2014-12-29 ENCOUNTER — Encounter: Payer: Self-pay | Admitting: Dietician

## 2014-12-29 VITALS — Ht 70.0 in | Wt 144.0 lb

## 2014-12-29 DIAGNOSIS — Z713 Dietary counseling and surveillance: Secondary | ICD-10-CM | POA: Insufficient documentation

## 2014-12-29 NOTE — Patient Instructions (Addendum)
Try adding back some grains such as brown rice, bulgar, quinoa, and oatmeal to meals. Or try eating fruit with meals.  Try having fish about 2 x week.

## 2014-12-29 NOTE — Progress Notes (Signed)
  Medical Nutrition Therapy:  Appt start time: 1115 end time:  1215.   Assessment:  Primary concerns today: [name] is here today since she has chronic migraines which started 4 years ago. Used to have them periodically. Has read a book that recommends a "paleo-type diet" or ketogenic diet to help with migraine. Has been using food for comfort. Is going to start a drug study in the next weeks. Had been doing botox injections which helped at first with migraines. 3 weeks ago cut out all processed foods, bread, and sugar. Still has sweet potatoes. Feels like she has been "eating like crap" before now. Was having sweets frequently. Has not had the energy to cook and has been doing a lot of fast food and take out.   Has always had low energy her whole life. Energy level is worse is starting this diet.   Doesn't have nutrient deficiencies that she is aware of. Used to be very healthy about 4 years ago.   Preferred Learning Style:   No preference indicated   Learning Readiness:   Ready  MEDICATIONS: see list   DIETARY INTAKE:  Usual eating pattern includes 3 meals and 2snacks per day.  Avoided foods include: dairy, processed foods, sugar, green peppers, red wine, chocolate, soy, nitrates, aged cheese, yogurt, onions    24-hr recall:  B ( AM): 2 eggs sometimes with cheese and 3 oz chicken and spinach and fruit Snk ( AM): none L ( PM): salad with protein (chicken or hamburger or steak)  Snk ( PM): nuts and vegetables  D ( PM): grilled meat or fish with broccoli, baked sweet potato (sometimes) or stir fry  Snk ( PM): almonds Beverages: water or herbal tea, sometimes almond or coconut milk   Usual physical activity: none - not possible at this time  Estimated energy needs: 1600 calories 180 g carbohydrates 120 g protein 44 g fat  Progress Towards Goal(s):  In progress.   Nutritional Diagnosis:  NI-5.8.1 Inadequate carbohydrate intake As related to diet for intractable migraines.   As evidenced by diet recall.    Intervention:  Nutrition counseling provided. Plan: Try adding back some grains such as brown rice, bulgar, quinoa, and oatmeal to meals. Or try eating fruit with meals.  Try having fish about 2 x week.   Teaching Method Utilized:  Visual Auditory Hands on  Barriers to learning/adherence to lifestyle change: chronic migraine and fatigue  Demonstrated degree of understanding via:  Teach Back   Monitoring/Evaluation:  Dietary intake, exercise, and body weight in 6 week(s).

## 2015-01-14 IMAGING — MG MM SCREENING W/IMPLANTS
8 series · 8 of 8 positions shown · non-contrast
Comparison: Previous exam(s)

ACR Breast Density Category  d.  Extreme fibroglandular tissue

CLINICAL DATA: Screening.

EXAM:
DIGITAL SCREENING BILATERAL MAMMOGRAM WITH IMPLANTS AND CAD
The patient has retropectoral implants. Standard and implant
displaced views were performed.

[R CC]
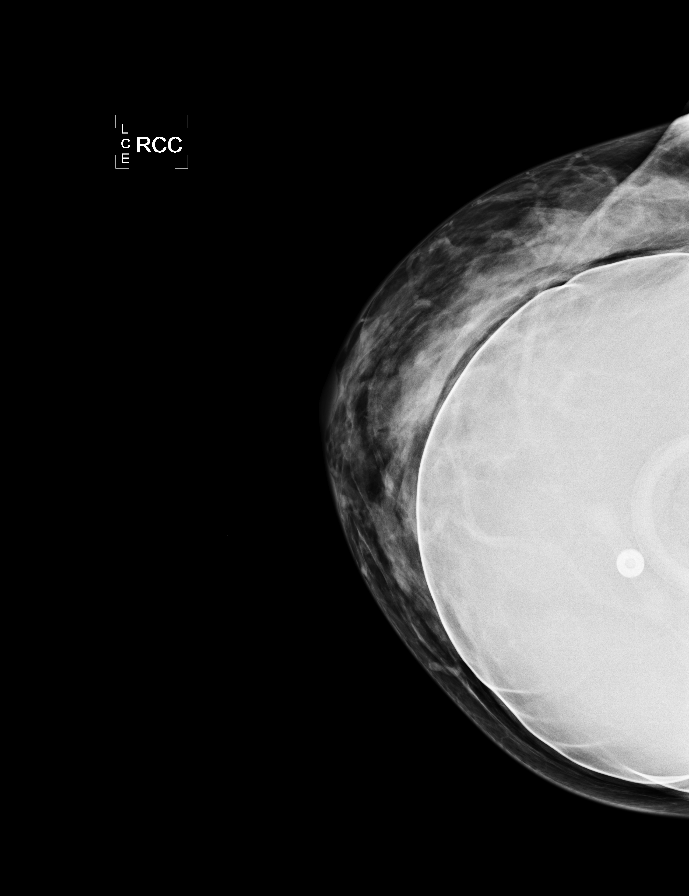

[L CC]
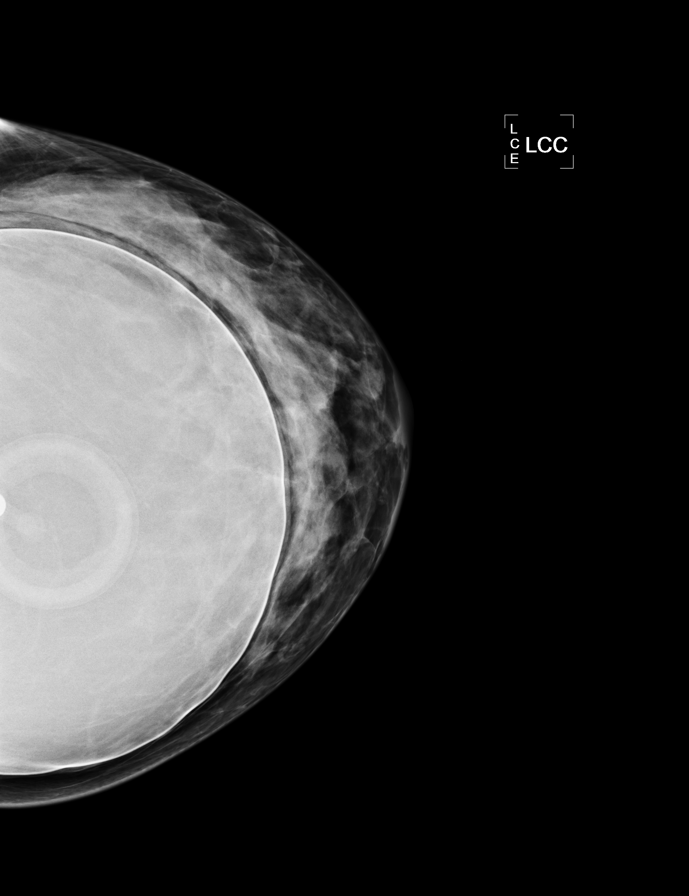

[L MLO]
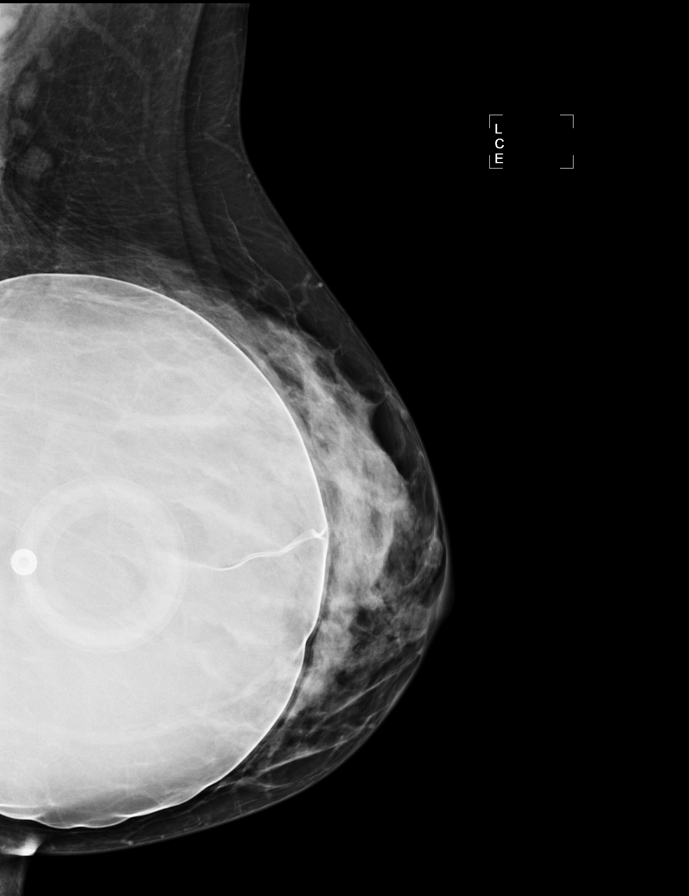

[R MLO]
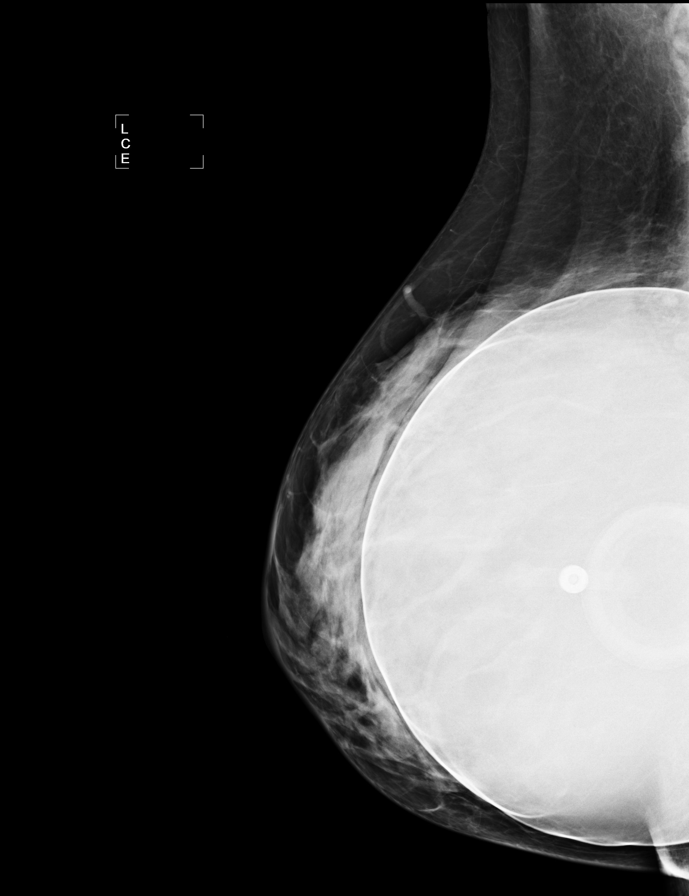

[R CCID]
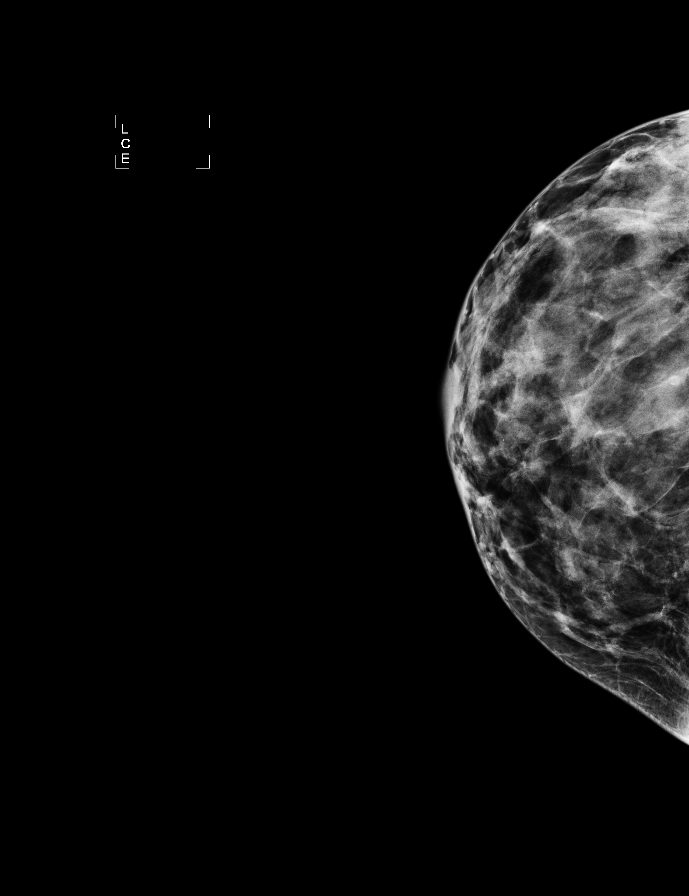

[L CCID]
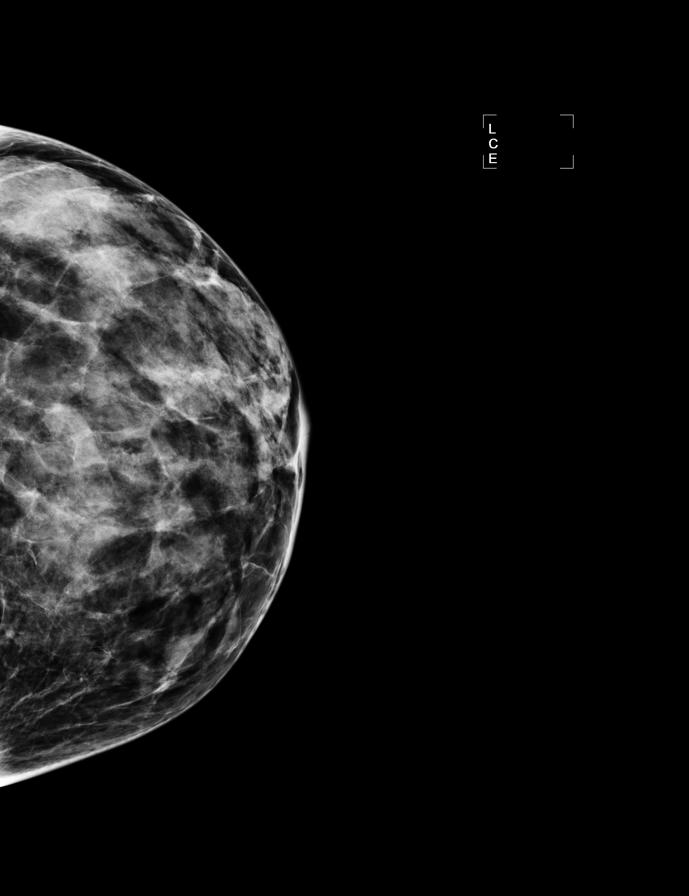

[L MLOID]
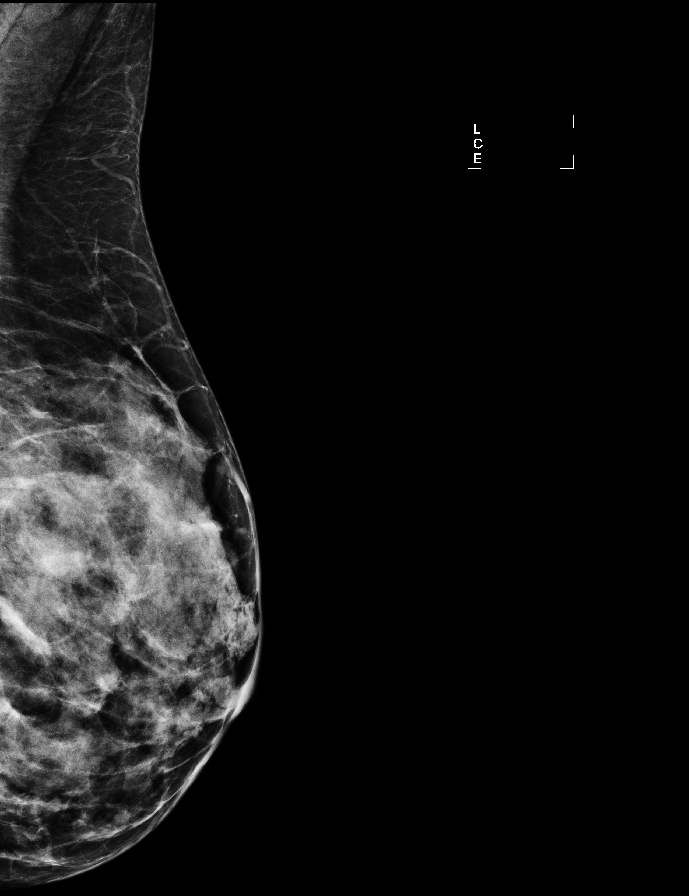

[R MLOID]
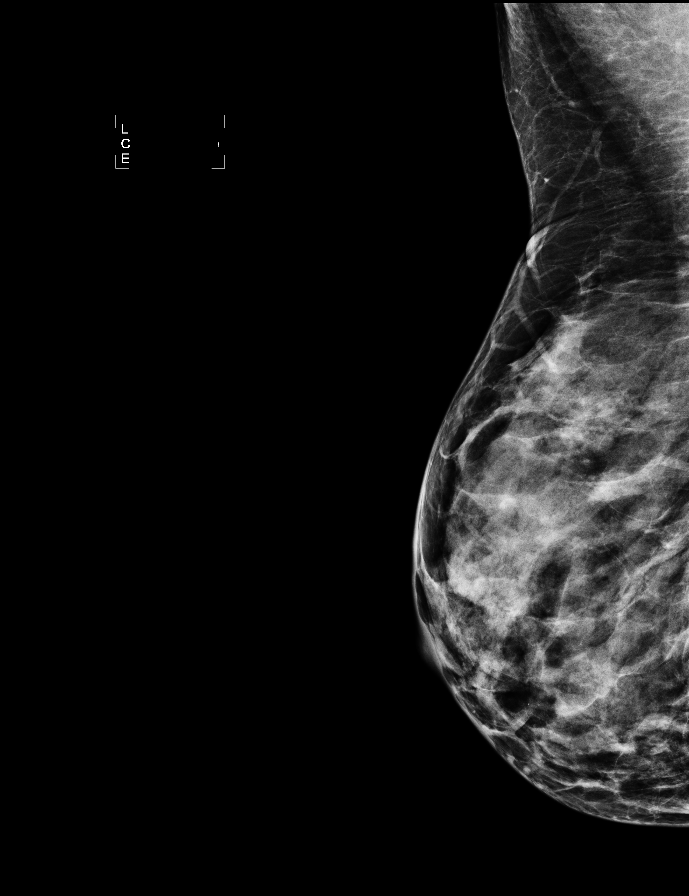

[8 of 8 positions shown; findings below may reference images not displayed]

FINDINGS: There are no findings suspicious for malignancy. Images were
processed with CAD.
IMPRESSION: No mammographic evidence of malignancy. A result letter of this
screening mammogram will be mailed directly to the patient.

RECOMMENDATION:
Screening mammogram in one year. (Code:[id number]-J-L9V)

BI-RADS CATEGORY  1:  Negative

## 2015-02-01 ENCOUNTER — Encounter: Payer: Self-pay | Admitting: Family Medicine

## 2015-02-03 ENCOUNTER — Other Ambulatory Visit: Payer: Self-pay

## 2015-02-03 MED ORDER — CLONAZEPAM 0.5 MG PO TABS: 1.0000 mg | ORAL_TABLET | Freq: Every day | ORAL | Status: DC

## 2015-02-03 NOTE — Telephone Encounter (Signed)
Rx called in to requested pharmacy 

## 2015-02-03 NOTE — Telephone Encounter (Signed)
Pt left v/m that [facility] has been requesting refill for clonazepam since first of week and pt request cb when refilled.Please advise. Last annual exam 08/10/14 and rx last refilled # 60 on 11/29/2014.

## 2015-02-06 ENCOUNTER — Ambulatory Visit: Payer: BLUE CROSS/BLUE SHIELD | Admitting: Dietician

## 2015-02-28 ENCOUNTER — Encounter: Payer: Self-pay | Admitting: Family Medicine

## 2015-03-13 ENCOUNTER — Encounter: Payer: Self-pay | Admitting: Family Medicine

## 2015-03-14 ENCOUNTER — Encounter: Payer: Self-pay | Admitting: Family Medicine

## 2015-03-14 DIAGNOSIS — Z7689 Persons encountering health services in other specified circumstances: Secondary | ICD-10-CM

## 2015-03-14 NOTE — Telephone Encounter (Signed)
Lm on pts vm and informed her letter is available for pickup from the front desk; pt advised of $20 fee

## 2015-04-03 ENCOUNTER — Encounter: Payer: Self-pay | Admitting: Family Medicine

## 2015-04-03 ENCOUNTER — Other Ambulatory Visit: Payer: Self-pay | Admitting: Family Medicine

## 2015-04-03 MED ORDER — TOPIRAMATE 100 MG PO TABS: 100.0000 mg | ORAL_TABLET | Freq: Two times a day (BID) | ORAL | Status: DC

## 2015-04-11 ENCOUNTER — Other Ambulatory Visit: Payer: Self-pay | Admitting: *Deleted

## 2015-04-11 MED ORDER — CLONAZEPAM 0.5 MG PO TABS: 1.0000 mg | ORAL_TABLET | Freq: Every day | ORAL | Status: DC

## 2015-04-11 NOTE — Telephone Encounter (Signed)
Last f/u appt 07/2014-[facility]

## 2015-04-11 NOTE — Telephone Encounter (Signed)
Rx called in to requested pharmacy 

## 2015-05-14 ENCOUNTER — Encounter: Payer: Self-pay | Admitting: Family Medicine

## 2015-05-31 ENCOUNTER — Other Ambulatory Visit: Payer: Self-pay

## 2015-05-31 MED ORDER — CLONAZEPAM 0.5 MG PO TABS: 1.0000 mg | ORAL_TABLET | Freq: Every day | ORAL | Status: DC

## 2015-05-31 NOTE — Telephone Encounter (Signed)
Rx called in to requested pharmacy 

## 2015-05-31 NOTE — Telephone Encounter (Signed)
Pt left v/m requesting refill clonazepam to [facility]. Pt request cb when refilled. Pt requested refill from pharmacy 05/26/15. Last refilled # 60 on 04/11/15. Last annual exam 08/10/14.

## 2015-06-01 ENCOUNTER — Other Ambulatory Visit: Payer: Self-pay | Admitting: *Deleted

## 2015-07-28 ENCOUNTER — Encounter: Payer: Self-pay | Admitting: Family Medicine

## 2015-08-03 ENCOUNTER — Other Ambulatory Visit: Payer: Self-pay | Admitting: Family Medicine

## 2015-08-03 DIAGNOSIS — E538 Deficiency of other specified B group vitamins: Secondary | ICD-10-CM

## 2015-08-03 DIAGNOSIS — Z01419 Encounter for gynecological examination (general) (routine) without abnormal findings: Secondary | ICD-10-CM

## 2015-08-10 ENCOUNTER — Other Ambulatory Visit (INDEPENDENT_AMBULATORY_CARE_PROVIDER_SITE_OTHER): Payer: BLUE CROSS/BLUE SHIELD

## 2015-08-10 DIAGNOSIS — Z0000 Encounter for general adult medical examination without abnormal findings: Secondary | ICD-10-CM | POA: Diagnosis not present

## 2015-08-10 DIAGNOSIS — E538 Deficiency of other specified B group vitamins: Secondary | ICD-10-CM | POA: Diagnosis not present

## 2015-08-10 DIAGNOSIS — Z01419 Encounter for gynecological examination (general) (routine) without abnormal findings: Secondary | ICD-10-CM

## 2015-08-10 LAB — COMPREHENSIVE METABOLIC PANEL
ALT: 25 U/L (ref 0–35)
AST: 23 U/L (ref 0–37)
Albumin: 4.3 g/dL (ref 3.5–5.2)
Alkaline Phosphatase: 57 U/L (ref 39–117)
BILIRUBIN TOTAL: 0.5 mg/dL (ref 0.2–1.2)
BUN: 21 mg/dL (ref 6–23)
CALCIUM: 9.4 mg/dL (ref 8.4–10.5)
CHLORIDE: 107 meq/L (ref 96–112)
CO2: 27 meq/L (ref 19–32)
Creatinine, Ser: 0.92 mg/dL (ref 0.40–1.20)
GFR: 67.96 mL/min (ref >60.00)
Glucose, Bld: 91 mg/dL (ref 70–99)
POTASSIUM: 3.5 meq/L (ref 3.5–5.1)
Sodium: 139 mEq/L (ref 135–145)
Total Protein: 7.3 g/dL (ref 6.0–8.3)

## 2015-08-10 LAB — LIPID PANEL
CHOL/HDL RATIO: 2
Cholesterol: 189 mg/dL (ref 0–200)
HDL: 79.2 mg/dL (ref >39.00)
LDL Cholesterol: 99 mg/dL (ref 0–99)
NonHDL: 110.12
TRIGLYCERIDES: 54 mg/dL (ref 0.0–149.0)
VLDL: 10.8 mg/dL (ref 0.0–40.0)

## 2015-08-10 LAB — CBC WITH DIFFERENTIAL/PLATELET
Basophils Absolute: 0.1 10*3/uL (ref 0.0–0.1)
Basophils Relative: 0.9 % (ref 0.0–3.0)
Eosinophils Absolute: 0.7 10*3/uL (ref 0.0–0.7)
Eosinophils Relative: 9.6 % — ABNORMAL HIGH (ref 0.0–5.0)
HEMATOCRIT: 42.4 % (ref 36.0–46.0)
Hemoglobin: 14.2 g/dL (ref 12.0–15.0)
LYMPHS PCT: 35.8 % (ref 12.0–46.0)
Lymphs Abs: 2.5 10*3/uL (ref 0.7–4.0)
MCHC: 33.4 g/dL (ref 30.0–36.0)
MCV: 93.6 fl (ref 78.0–100.0)
MONOS PCT: 8.5 % (ref 3.0–12.0)
Monocytes Absolute: 0.6 10*3/uL (ref 0.1–1.0)
NEUTROS ABS: 3.2 10*3/uL (ref 1.4–7.7)
Neutrophils Relative %: 45.2 % (ref 43.0–77.0)
PLATELETS: 254 10*3/uL (ref 150.0–400.0)
RBC: 4.53 Mil/uL (ref 3.87–5.11)
RDW: 14.5 % (ref 11.5–15.5)
WBC: 7 10*3/uL (ref 4.0–10.5)

## 2015-08-10 LAB — TSH: TSH: 1.97 u[IU]/mL (ref 0.35–4.50)

## 2015-08-10 LAB — VITAMIN B12: VITAMIN B 12: 369 pg/mL (ref 211–911)

## 2015-08-10 LAB — VITAMIN D 25 HYDROXY (VIT D DEFICIENCY, FRACTURES): VITD: 35.22 ng/mL (ref 30.00–100.00)

## 2015-08-14 ENCOUNTER — Encounter: Payer: Self-pay | Admitting: Family Medicine

## 2015-08-14 ENCOUNTER — Other Ambulatory Visit (HOSPITAL_COMMUNITY)
Admission: RE | Admit: 2015-08-14 | Discharge: 2015-08-14 | Disposition: A | Discharge status: Home / Self Care | Payer: BLUE CROSS/BLUE SHIELD | Source: Ambulatory Visit | Attending: Family Medicine | Admitting: Family Medicine

## 2015-08-14 ENCOUNTER — Ambulatory Visit (INDEPENDENT_AMBULATORY_CARE_PROVIDER_SITE_OTHER): Payer: BLUE CROSS/BLUE SHIELD | Admitting: Family Medicine

## 2015-08-14 VITALS — BP 112/62 | HR 89 | Temp 98.0°F | Ht 69.5 in | Wt 145.8 lb

## 2015-08-14 DIAGNOSIS — G43801 Other migraine, not intractable, with status migrainosus: Secondary | ICD-10-CM

## 2015-08-14 DIAGNOSIS — Z01419 Encounter for gynecological examination (general) (routine) without abnormal findings: Secondary | ICD-10-CM

## 2015-08-14 DIAGNOSIS — M2550 Pain in unspecified joint: Secondary | ICD-10-CM | POA: Insufficient documentation

## 2015-08-14 DIAGNOSIS — F32A Depression, unspecified: Secondary | ICD-10-CM

## 2015-08-14 DIAGNOSIS — E538 Deficiency of other specified B group vitamins: Secondary | ICD-10-CM

## 2015-08-14 DIAGNOSIS — Z1159 Encounter for screening for other viral diseases: Secondary | ICD-10-CM

## 2015-08-14 DIAGNOSIS — Z1239 Encounter for other screening for malignant neoplasm of breast: Secondary | ICD-10-CM

## 2015-08-14 DIAGNOSIS — F329 Major depressive disorder, single episode, unspecified: Secondary | ICD-10-CM

## 2015-08-14 DIAGNOSIS — Z23 Encounter for immunization: Secondary | ICD-10-CM

## 2015-08-14 DIAGNOSIS — G4700 Insomnia, unspecified: Secondary | ICD-10-CM | POA: Diagnosis not present

## 2015-08-14 DIAGNOSIS — Z1151 Encounter for screening for human papillomavirus (HPV): Secondary | ICD-10-CM | POA: Diagnosis present

## 2015-08-14 DIAGNOSIS — E039 Hypothyroidism, unspecified: Secondary | ICD-10-CM | POA: Diagnosis not present

## 2015-08-14 MED ORDER — CYANOCOBALAMIN 1000 MCG/ML IJ SOLN
1000.0000 ug | Freq: Once | INTRAMUSCULAR | Status: AC
  Administered 2015-08-14: 1000 ug via INTRAMUSCULAR

## 2015-08-14 MED ORDER — TRIAMCINOLONE ACETONIDE 0.1 % EX CREA: 1.0000 "application " | TOPICAL_CREAM | Freq: Two times a day (BID) | CUTANEOUS | Status: DC

## 2015-08-14 NOTE — Progress Notes (Signed)
Pre visit review using our clinic review tool, if applicable. No additional management support is needed unless otherwise documented below in the visit note. 

## 2015-08-14 NOTE — Assessment & Plan Note (Signed)
Reviewed preventive care protocols, scheduled due services, and updated immunizations Discussed nutrition, exercise, diet, and healthy lifestyle.  Discussed USPSTF recommendations of cervical cancer screening.  She is aware that interval of 3 years is recommended but pt would prefer to have pap smear done today.  

## 2015-08-14 NOTE — Progress Notes (Signed)
Subjective:    Patient ID: [name], female    DOB: October 11, 1960, 54 y.o.   MRN: [id number]  HPI  54 yo pleasant female here for CPX.  G2P2- last pap smear was 08/10/14 - done by me.  Was normal. S/p conization for cervical dysplasia in 1990.  Denies any vaginal complaints.   Last mammogram 07/01/13  Periods are heavier now but still regular- triggering more migraines.  Colonoscopy 07/13/13- Dr. Hilarie Fredrickson- 10 year recall-  No changes in bowel habits or blood in her stool.  Has multiple concerns- Weaned herself off clonazepam.  She is concerned about multiple issues concerning her health.  Sleeping ok.  Joints are hurting- hands, feet, knees elbows, hips.  No joint swelling or redness.  Also has pins and needles feeling in her hands and feet.  Hypothyroidism- On Armour 60 mg daily for hypothyroidism.  Denies any symptoms of hypo or hyperthyroidism.   Lab Results  Component Value Date   TSH 1.97 08/10/2015    Lab Results  Component Value Date   CHOL 189 08/10/2015   HDL 79.20 08/10/2015   LDLCALC 99 08/10/2015   TRIG 54.0 08/10/2015   CHOLHDL 2 08/10/2015   Migraines-  deteriorated. Followed by new neurologist in [place]- in drug study- cGRP.   Also taking topamax 50 mg daily.  Lab Results  Component Value Date   CREATININE 0.92 08/10/2015   Lab Results  Component Value Date   ALT 25 08/10/2015   AST 23 08/10/2015   ALKPHOS 57 08/10/2015   BILITOT 0.5 08/10/2015   Lab Results  Component Value Date   WBC 7.0 08/10/2015   HGB 14.2 08/10/2015   HCT 42.4 08/10/2015   MCV 93.6 08/10/2015   PLT 254.0 08/10/2015     Patient Active Problem List   Diagnosis Date Noted  . Well woman exam with routine gynecological exam 08/10/2014  . Vitamin B12 deficiency 08/10/2014  . Tinnitus 07/05/2014  . Hoarseness 07/05/2014  . Well woman exam 06/22/2014  . Speech abnormality 08/01/2013  . Insomnia 06/30/2012  . Migraine with status migrainosus 05/23/2011  .  Depression   . Hypothyroidism    Past Medical History  Diagnosis Date  . Hip fracture, left ([place]) 2005    s/p ORIF  . Hypothyroidism   . Fibromyalgia   . Depression   . Allergic rhinitis   . Chronic sinusitis   . Cervical dysplasia 1990    s/p conization  . Migraine    Past Surgical History  Procedure Laterality Date  . Orif hip fracture  2005    Daldorf  . Breast enhancement surgery  2007  . Tonsillectomy and adenoidectomy    . Dilation and curettage of uterus  1989   Social History  Substance Use Topics  . Smoking status: Never Smoker   . Smokeless tobacco: Never Used  . Alcohol Use: Yes     Comment: rarely   Family History  Problem Relation Age of Onset  . Arthritis Mother     RA and OA  . Cancer Maternal Aunt 48    breast CA  . Colon cancer Neg Hx   . Pancreatic cancer Neg Hx   . Stomach cancer Neg Hx    No Known Allergies Current Outpatient Prescriptions on File Prior to Visit  Medication Sig Dispense Refill  . baclofen (LIORESAL) 10 MG tablet as needed.     . cetirizine (ZYRTEC) 10 MG tablet Take 10 mg by mouth daily as needed for allergies.    Marland Kitchen  hydrOXYzine (ATARAX/VISTARIL) 50 MG tablet Take 1 tablet (50 mg total) by mouth every 6 (six) hours as needed. 60 tablet 0  . Magnesium Citrate 100 MG TABS Take 200 mg by mouth daily.    . Melatonin 10 MG TABS Take 10 mg by mouth at bedtime.    . Multiple Vitamin (MULTIVITAMIN) capsule Take 1 capsule by mouth daily.      . pantoprazole (PROTONIX) 40 MG tablet Take 40 mg by mouth 2 (two) times daily.    . SUMAtriptan (IMITREX) 50 MG tablet Take 1 tablet (50 mg total) by mouth as needed. 10 tablet 6  . thyroid (ARMOUR) 65 MG tablet Take 1 tablet (65 mg total) by mouth daily. 90 tablet 3  . topiramate (TOPAMAX) 100 MG tablet Take 1 tablet (100 mg total) by mouth 2 (two) times daily. (Patient taking differently: Take 50 mg by mouth 2 (two) times daily. ) 30 tablet 2   No current facility-administered medications on  file prior to visit.   The PMH, PSH, Social History, Family History, Medications, and allergies have been reviewed in [facility], and have been updated if relevant.   Review of Systems  Constitutional: Negative.   Respiratory: Negative.   Cardiovascular: Negative.   Gastrointestinal: Negative.   Genitourinary: Negative.   Musculoskeletal: Positive for myalgias.  Neurological: Positive for numbness and headaches. Negative for tremors, syncope, facial asymmetry and weakness.  All other systems reviewed and are negative.   Wt Readings from Last 3 Encounters:  08/14/15 145 lb 12 oz (66.112 kg)  12/29/14 144 lb (65.318 kg)  10/10/14 150 lb 4 oz (68.153 kg)    Objective:   Physical Exam BP 112/62 mmHg  Pulse 89  Temp(Src) 98 F (36.7 C) (Oral)  Ht 5' 9.5" (1.765 m)  Wt 145 lb 12 oz (66.112 kg)  BMI 21.22 kg/m2  SpO2 99% Wt Readings from Last 3 Encounters:  08/14/15 145 lb 12 oz (66.112 kg)  12/29/14 144 lb (65.318 kg)  10/10/14 150 lb 4 oz (68.153 kg)  General:  Well-developed,well-nourished,in no acute distress; alert,appropriate and cooperative throughout examination Head:  normocephalic and atraumatic.   Eyes:  vision grossly intact, pupils equal, pupils round, and pupils reactive to light.   Ears:  R ear normal and L ear normal.   Nose:  no external deformity.   Mouth:  good dentition.   Neck:  No deformities, masses, or tenderness noted. Breasts:  +implants bilaterally  No mass, nodules, thickening, tenderness, bulging, retraction, inflamation, nipple discharge or skin changes noted.   Lungs:  Normal respiratory effort, chest expands symmetrically. Lungs are clear to auscultation, no crackles or wheezes. Heart:  Normal rate and regular rhythm. S1 and S2 normal without gallop, murmur, click, rub or other extra sounds. Abdomen:  Bowel sounds positive,abdomen soft and non-tender without masses, organomegaly or hernias noted. Rectal:  no external abnormalities.   Genitalia:   Pelvic Exam:        External: normal female genitalia without lesions or masses        Vagina: normal without lesions or masses        Cervix: normal without lesions or masses        Adnexa: normal bimanual exam without masses or fullness        Uterus: normal by palpation        Pap smear: performed Msk:  No deformity or scoliosis noted of thoracic or lumbar spine.   Extremities:  No clubbing, cyanosis, edema, or deformity noted with  normal full range of motion of all joints.   Neurologic:  alert & oriented X3 and gait normal.   Skin:  Intact without suspicious lesions or rashes Cervical Nodes:  No lymphadenopathy noted Axillary Nodes:  No palpable lymphadenopathy Psych:  Cognition and judgment appear intact. Alert and cooperative with normal attention span and concentration. No apparent delusions, illusions, hallucinations    Assessment & Plan:

## 2015-08-14 NOTE — Patient Instructions (Signed)
Please call to set up your mammogram.  Happy Holidays.  Please let me know what the study coordinator tells you.

## 2015-08-14 NOTE — Assessment & Plan Note (Signed)
Total time spent with patient was 65 minutes, with 25 minutes spent specifically on problem visit concerning myalgias and paresthesias.  Attempted to answer her concerns to the best of my ability but advised her to call the physician performing her drug study as this could certainly be related. She will keep me updated.

## 2015-08-14 NOTE — Addendum Note (Signed)
Addended by: Modena Nunnery on: 08/14/2015 09:50 AM   Modules accepted: Orders

## 2015-08-14 NOTE — Assessment & Plan Note (Signed)
Followed by neurologist in [place]- in a drug study.

## 2015-08-14 NOTE — Assessment & Plan Note (Signed)
Euthyroid. No chnages made today.

## 2015-08-14 NOTE — Addendum Note (Signed)
Addended by: Lucille Passy on: 08/14/2015 09:50 AM   Modules accepted: Orders, SmartSet

## 2015-08-15 ENCOUNTER — Encounter: Payer: Self-pay | Admitting: *Deleted

## 2015-08-15 LAB — HEPATITIS C ANTIBODY: HCV Ab: NEGATIVE

## 2015-08-16 ENCOUNTER — Encounter: Payer: Self-pay | Admitting: *Deleted

## 2015-08-16 ENCOUNTER — Encounter: Payer: Self-pay | Admitting: Family Medicine

## 2015-08-16 LAB — CYTOLOGY - PAP

## 2015-08-18 NOTE — Telephone Encounter (Signed)
Pt left v/m requesting response to mychart note sent on 08/16/15. Pt request answer 08/18/15 at [phone]. Dr Deborra Medina out of office today.Please advise.

## 2015-09-18 ENCOUNTER — Other Ambulatory Visit: Payer: Self-pay | Admitting: *Deleted

## 2015-09-18 MED ORDER — THYROID 65 MG PO TABS: 65.0000 mg | ORAL_TABLET | Freq: Every day | ORAL | Status: DC

## 2015-09-27 ENCOUNTER — Other Ambulatory Visit: Payer: Self-pay

## 2015-09-27 ENCOUNTER — Other Ambulatory Visit: Payer: Self-pay | Admitting: Family Medicine

## 2015-09-27 DIAGNOSIS — Z1231 Encounter for screening mammogram for malignant neoplasm of breast: Secondary | ICD-10-CM

## 2015-10-19 ENCOUNTER — Ambulatory Visit: Payer: BLUE CROSS/BLUE SHIELD

## 2015-11-21 ENCOUNTER — Ambulatory Visit (INDEPENDENT_AMBULATORY_CARE_PROVIDER_SITE_OTHER): Payer: BLUE CROSS/BLUE SHIELD | Admitting: Family Medicine

## 2015-11-21 ENCOUNTER — Encounter: Payer: Self-pay | Admitting: Family Medicine

## 2015-11-21 VITALS — BP 112/60 | HR 82 | Temp 98.0°F | Wt 157.8 lb

## 2015-11-21 DIAGNOSIS — J0110 Acute frontal sinusitis, unspecified: Secondary | ICD-10-CM | POA: Diagnosis not present

## 2015-11-21 DIAGNOSIS — F411 Generalized anxiety disorder: Secondary | ICD-10-CM

## 2015-11-21 DIAGNOSIS — J019 Acute sinusitis, unspecified: Secondary | ICD-10-CM | POA: Insufficient documentation

## 2015-11-21 MED ORDER — AMOXICILLIN-POT CLAVULANATE 875-125 MG PO TABS: 1.0000 | ORAL_TABLET | Freq: Two times a day (BID) | ORAL | Status: AC

## 2015-11-21 MED ORDER — DEXAMETHASONE SODIUM PHOSPHATE 10 MG/ML IJ SOLN
10.0000 mg | Freq: Once | INTRAMUSCULAR | Status: AC
  Administered 2015-11-21: 10 mg via INTRAMUSCULAR

## 2015-11-21 MED ORDER — ALPRAZOLAM 0.25 MG PO TABS: 0.2500 mg | ORAL_TABLET | Freq: Two times a day (BID) | ORAL | Status: DC | PRN

## 2015-11-21 MED ORDER — SUMATRIPTAN SUCCINATE 50 MG PO TABS: 50.0000 mg | ORAL_TABLET | ORAL | Status: DC | PRN

## 2015-11-21 NOTE — Assessment & Plan Note (Addendum)
Given duration and progression of symptoms, will treat for bacterial sinusitis. IM decadron in office, Augmentin eRx sent.  Symptomatic therapy suggested: push fluids, rest and return office visit prn if symptoms persist or worsen. Call or return to clinic prn if these symptoms worsen or fail to improve as anticipated.

## 2015-11-21 NOTE — Progress Notes (Signed)
SUBJECTIVE:  [name] is a 55 y.o. female who complains of coryza, congestion, productive cough and bilateral sinus pain for 14 days. She denies a history of anorexia and denies a history of asthma. Patient denies smoke cigarettes.   She is also having worsening anxiety and asking to restart klonipin or something similar.  Attending psychotherapy. Current Outpatient Prescriptions on File Prior to Visit  Medication Sig Dispense Refill  . baclofen (LIORESAL) 10 MG tablet as needed.     . cetirizine (ZYRTEC) 10 MG tablet Take 10 mg by mouth daily as needed for allergies.    . hydrOXYzine (ATARAX/VISTARIL) 50 MG tablet Take 1 tablet (50 mg total) by mouth every 6 (six) hours as needed. 60 tablet 0  . Magnesium Citrate 100 MG TABS Take 200 mg by mouth daily.    . Melatonin 10 MG TABS Take 10 mg by mouth at bedtime.    . Multiple Vitamin (MULTIVITAMIN) capsule Take 1 capsule by mouth daily.      . NON FORMULARY CGRP    . thyroid (ARMOUR) 65 MG tablet Take 1 tablet (65 mg total) by mouth daily. 90 tablet 2  . topiramate (TOPAMAX) 100 MG tablet Take 1 tablet (100 mg total) by mouth 2 (two) times daily. (Patient taking differently: Take 50 mg by mouth 2 (two) times daily. ) 30 tablet 2   No current facility-administered medications on file prior to visit.    No Known Allergies  Past Medical History  Diagnosis Date  . Hip fracture, left ([place]) 2005    s/p ORIF  . Hypothyroidism   . Fibromyalgia   . Depression   . Allergic rhinitis   . Chronic sinusitis   . Cervical dysplasia 1990    s/p conization  . Migraine     Past Surgical History  Procedure Laterality Date  . Orif hip fracture  2005    Daldorf  . Breast enhancement surgery  2007  . Tonsillectomy and adenoidectomy    . Dilation and curettage of uterus  1989    Family History  Problem Relation Age of Onset  . Arthritis Mother     RA and OA  . Cancer Maternal Aunt 48    breast CA  . Colon cancer Neg Hx   . Pancreatic  cancer Neg Hx   . Stomach cancer Neg Hx     Social History   Social History  . Marital Status: Married    Spouse Name: N/A  . Number of Children: N/A  . Years of Education: N/A   Occupational History  . Not on file.   Social History Main Topics  . Smoking status: Never Smoker   . Smokeless tobacco: Never Used  . Alcohol Use: Yes     Comment: rarely  . Drug Use: No  . Sexual Activity: Not on file   Other Topics Concern  . 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   The PMH, PSH, Social History, Family History, Medications, and allergies have been reviewed in [facility], and have been updated if relevant.  Review of Systems  Constitutional: Negative.   HENT: Positive for rhinorrhea and sinus pressure.   Respiratory: Positive for cough.   Cardiovascular: Negative.   Psychiatric/Behavioral: Positive for sleep disturbance. Negative for suicidal ideas and self-injury. The patient is nervous/anxious.   All other systems reviewed and are negative.   OBJECTIVE: BP 112/60 mmHg  Pulse 82  Temp(Src) 98 F (36.7 C) (Oral)  Wt 157 lb 12 oz (71.555 kg)  SpO2 98%  LMP 11/09/2015  She appears well, vital signs are as noted. Ears normal.  Throat and pharynx normal.  Neck supple. No adenopathy in the neck. Nose is congested. Sinuses tender. The chest is clear, without wheezes or rales. Anxious

## 2015-11-21 NOTE — Progress Notes (Signed)
Pre visit review using our clinic review tool, if applicable. No additional management support is needed unless otherwise documented below in the visit note. 

## 2015-11-21 NOTE — Addendum Note (Signed)
Addended by: Modena Nunnery on: 11/21/2015 12:09 PM   Modules accepted: Orders

## 2015-11-21 NOTE — Assessment & Plan Note (Signed)
Deteriorated. >25 min spent with patient, at least half of which was spent on counseling anxiety and insomnia.  Continue psychotherapy.  The problem of recurrent insomnia is discussed. Avoidance of caffeine sources is strongly encouraged. Sleep hygiene issues are reviewed. The use of sedative hypnotics for temporary relief is appropriate; we discussed the addictive nature of these drugs, and a one-time only prescription for prn use of a hypnotic is given, to use no more than 3 times per week for 2-3 weeks.

## 2015-12-04 ENCOUNTER — Encounter: Payer: Self-pay | Admitting: Family Medicine

## 2015-12-04 ENCOUNTER — Other Ambulatory Visit: Payer: Self-pay | Admitting: Family Medicine

## 2015-12-04 DIAGNOSIS — M50323 Other cervical disc degeneration at C6-C7 level: Secondary | ICD-10-CM | POA: Diagnosis not present

## 2015-12-04 DIAGNOSIS — M9903 Segmental and somatic dysfunction of lumbar region: Secondary | ICD-10-CM | POA: Diagnosis not present

## 2015-12-04 DIAGNOSIS — M9901 Segmental and somatic dysfunction of cervical region: Secondary | ICD-10-CM | POA: Diagnosis not present

## 2015-12-04 MED ORDER — ALPRAZOLAM 0.25 MG PO TABS: ORAL_TABLET | ORAL | Status: DC

## 2015-12-11 ENCOUNTER — Encounter: Payer: Self-pay | Admitting: Family Medicine

## 2015-12-11 ENCOUNTER — Other Ambulatory Visit: Payer: Self-pay | Admitting: Family Medicine

## 2015-12-11 MED ORDER — AMOXICILLIN-POT CLAVULANATE 875-125 MG PO TABS: 1.0000 | ORAL_TABLET | Freq: Two times a day (BID) | ORAL | Status: AC

## 2015-12-12 DIAGNOSIS — F321 Major depressive disorder, single episode, moderate: Secondary | ICD-10-CM | POA: Diagnosis not present

## 2015-12-18 DIAGNOSIS — M9903 Segmental and somatic dysfunction of lumbar region: Secondary | ICD-10-CM | POA: Diagnosis not present

## 2015-12-18 DIAGNOSIS — M9901 Segmental and somatic dysfunction of cervical region: Secondary | ICD-10-CM | POA: Diagnosis not present

## 2015-12-18 DIAGNOSIS — M50323 Other cervical disc degeneration at C6-C7 level: Secondary | ICD-10-CM | POA: Diagnosis not present

## 2015-12-26 DIAGNOSIS — F321 Major depressive disorder, single episode, moderate: Secondary | ICD-10-CM | POA: Diagnosis not present

## 2016-01-03 DIAGNOSIS — Z1283 Encounter for screening for malignant neoplasm of skin: Secondary | ICD-10-CM | POA: Diagnosis not present

## 2016-01-03 DIAGNOSIS — L821 Other seborrheic keratosis: Secondary | ICD-10-CM | POA: Diagnosis not present

## 2016-01-08 DIAGNOSIS — G43719 Chronic migraine without aura, intractable, without status migrainosus: Secondary | ICD-10-CM | POA: Diagnosis not present

## 2016-01-10 ENCOUNTER — Encounter: Payer: Self-pay | Admitting: Family Medicine

## 2016-01-10 ENCOUNTER — Other Ambulatory Visit: Payer: Self-pay | Admitting: Family Medicine

## 2016-01-10 NOTE — Telephone Encounter (Signed)
Please phone in rx as entered below. 

## 2016-01-11 MED ORDER — ALPRAZOLAM 0.25 MG PO TABS: ORAL_TABLET | ORAL | Status: DC

## 2016-01-11 NOTE — Telephone Encounter (Signed)
Rx called in to requested pharmacy 

## 2016-01-15 ENCOUNTER — Telehealth: Payer: Self-pay | Admitting: Family Medicine

## 2016-01-15 NOTE — Telephone Encounter (Signed)
Pt calling because she asked the pharmacy to send [place] a refill request for her Xanax on Friday 5/19.  The pharmacy said they faxed it over to [place] but they haven't heard back.  Pt would like her Xanax refilled. Please call patient [phone]

## 2016-01-15 NOTE — Telephone Encounter (Signed)
Rx was called in to pharmacy 5/17

## 2016-01-16 ENCOUNTER — Encounter: Payer: Self-pay | Admitting: Family Medicine

## 2016-01-16 NOTE — Telephone Encounter (Signed)
Spoke to pt and advised pt that Rx will have to be transferred by the pharmacy. Per her contract, pt was not to have medication sent to any other pharmacy. Pt had been communicating with Dr Deborra Medina via Deloris Ping, and requested alternate pharmacy for controlled substance, which Dr Deborra Medina agreed to, outside of pts contract. Pt now states that [place] is [place] and [place] and is wanting it called in to [facility]. Advised pt that per Gina,RN, she will need to contact [facility], to have medication transferred

## 2016-01-16 NOTE — Telephone Encounter (Signed)
Spoke to pt and advised that the Rx has been transferred. Pharmacy at [facility] states that they will contact pharmacy in [facility] and have Rx switched, which is what pt was originally advised. Instead, [facility] states they would not [facility] it for her, but instead could do so at the receiving pharmacies request. Advised pt to contact [facility] to confirm when medication will be available for pickup. Pt advised for future reference, she is not able to request Rx be sent to any location other that the one indicated on the contract she signed when she began taking a controlled substance.

## 2016-01-16 NOTE — Telephone Encounter (Addendum)
[name] with [facility] left v[facility]m;  [facility]. [name] request cb to inactivate rx and request new xanax rx to [facility].Please advise.(sending to [place]; not sure if can change pharmacy on controlled substance contract).

## 2016-01-17 DIAGNOSIS — G43709 Chronic migraine without aura, not intractable, without status migrainosus: Secondary | ICD-10-CM | POA: Diagnosis not present

## 2016-01-23 DIAGNOSIS — F321 Major depressive disorder, single episode, moderate: Secondary | ICD-10-CM | POA: Diagnosis not present

## 2016-02-06 DIAGNOSIS — F321 Major depressive disorder, single episode, moderate: Secondary | ICD-10-CM | POA: Diagnosis not present

## 2016-02-16 ENCOUNTER — Other Ambulatory Visit: Payer: Self-pay | Admitting: *Deleted

## 2016-02-16 MED ORDER — ALPRAZOLAM 0.25 MG PO TABS: ORAL_TABLET | ORAL | Status: DC

## 2016-02-16 NOTE — Telephone Encounter (Signed)
rx called in to requested pharmacy 

## 2016-02-16 NOTE — Telephone Encounter (Signed)
Last f/u 10/2015 

## 2016-02-20 DIAGNOSIS — F321 Major depressive disorder, single episode, moderate: Secondary | ICD-10-CM | POA: Diagnosis not present

## 2016-03-05 DIAGNOSIS — F321 Major depressive disorder, single episode, moderate: Secondary | ICD-10-CM | POA: Diagnosis not present

## 2016-03-19 DIAGNOSIS — F321 Major depressive disorder, single episode, moderate: Secondary | ICD-10-CM | POA: Diagnosis not present

## 2016-04-23 DIAGNOSIS — H6983 Other specified disorders of Eustachian tube, bilateral: Secondary | ICD-10-CM | POA: Diagnosis not present

## 2016-05-01 ENCOUNTER — Telehealth: Payer: Self-pay | Admitting: Family Medicine

## 2016-05-01 DIAGNOSIS — Z23 Encounter for immunization: Secondary | ICD-10-CM | POA: Diagnosis not present

## 2016-05-01 DIAGNOSIS — F321 Major depressive disorder, single episode, moderate: Secondary | ICD-10-CM | POA: Diagnosis not present

## 2016-05-01 DIAGNOSIS — S61211A Laceration without foreign body of left index finger without damage to nail, initial encounter: Secondary | ICD-10-CM | POA: Diagnosis not present

## 2016-05-01 DIAGNOSIS — S61213A Laceration without foreign body of left middle finger without damage to nail, initial encounter: Secondary | ICD-10-CM | POA: Diagnosis not present

## 2016-05-01 NOTE — Telephone Encounter (Signed)
[facility] is appropriate

## 2016-05-01 NOTE — Telephone Encounter (Signed)
Patient Name: [name] DOB: May 27, 1961 Initial Comment cut her finger, is bleeding very bad Nurse Assessment Nurse: Ronnald Ramp, RN, Miranda Date/Time (Eastern Time): 05/01/2016 9:45:05 AM Confirm and document reason for call. If symptomatic, describe symptoms. You must click the next button to save text entered. ---Caller states she cut her left middle finger with a knife. She states it cut through the nail also. Has the patient traveled out of the country within the last 30 days? ---Not Applicable Does the patient have any new or worsening symptoms? ---Yes Will a triage be completed? ---No Select reason for no triage. ---Patient declined Please document clinical information provided and list any resource used. ---Caller asked if she would be able to be seen at the office. No appts available until 2pm today and caller states she is just going to go to [facility]. Guidelines Guideline Title Affirmed Question Affirmed Notes Final Disposition User Clinical Call Ronnald Ramp, RN, [employer]

## 2016-05-02 ENCOUNTER — Telehealth: Payer: Self-pay | Admitting: Family Medicine

## 2016-05-02 ENCOUNTER — Encounter: Payer: Self-pay | Admitting: Family Medicine

## 2016-05-02 NOTE — Telephone Encounter (Signed)
Patient Name: [name]  DOB: November 18, 1960    Initial Comment caller states she has wound care questions   Nurse Assessment  Nurse: Raphael Gibney, RN, Vanita Ingles Date/Time Eilene Ghazi Time): 05/02/2016 2:48:37 PM  Confirm and document reason for call. If symptomatic, describe symptoms. You must click the next button to save text entered. ---Caller states she went to the urgent care for a cut on her left middle finger. They put stitches in her finger. They put dermabond on her nailbed.  Has the patient traveled out of the country within the last 30 days? ---Not Applicable  Does the patient have any new or worsening symptoms? ---Yes  Will a triage be completed? ---Yes  Related visit to physician within the last 2 weeks? ---Yes  Does the PT have any chronic conditions? (i.e. diabetes, asthma, etc.) ---No  Is the patient pregnant or possibly pregnant? (Ask all females between the ages of 72-55) ---No  Is this a behavioral health or substance abuse call? ---No     Guidelines    Guideline Title Affirmed Question Affirmed Notes  Suture or Staple Questions Care of sutured wound, questions about    Final Disposition User   [place], RN, Vanita Ingles    Disagree/Comply: Comply

## 2016-05-02 NOTE — Telephone Encounter (Signed)
PLEASE NOTE: All timestamps contained within this report are represented as [place] Standard Time. CONFIDENTIALTY NOTICE: This fax transmission is intended only for the addressee. It contains information that is legally privileged, confidential or otherwise protected from use or disclosure. If you are not the intended recipient, you are strictly prohibited from reviewing, disclosing, copying using or disseminating any of this information or taking any action in reliance on or regarding this information. If you have received this fax in error, please notify us immediately by telephone so that we can arrange for its return to [place]. Phone: [phone], Toll-Free: [phone], Fax: [phone] Page: 1 of 2 Call Id: [id number] [place] Patient Name: [name] Gender: Female DOB: [birth year] Age: 55 Y 28 M 15 D Return Phone Number: [phone] (Primary) Address: City/State/Zip: [place] [address] Client [facility] - Client Client Site [place] - Day Physician Arnette Norris - MD Contact Type Call Who Is Calling Patient / Member / Family / Caregiver Call Type Triage / Clinical Relationship To Patient Self Return Phone Number [phone] (Primary) Chief Complaint Wound Check or Dressing Change Reason for Call Symptomatic / Request for Health Information Initial Comment caller states she has wound care questions Appointment Disposition EMR Appointment Not Necessary Info pasted into Epic Yes PreDisposition Call Doctor Translation No Nurse Assessment Nurse: Raphael Gibney, RN, Vanita Ingles Date/Time (Eastern Time): 05/02/2016 2:48:37 PM Confirm and document reason for call. If symptomatic, describe symptoms. You must click the next button to save text entered. ---Caller states she went to the urgent care for a cut on her left middle finger. They put stitches in her finger.  They put dermabond on her nailbed. Has the patient traveled out of the country within the last 30 days? ---Not Applicable Does the patient have any new or worsening symptoms? ---Yes Will a triage be completed? ---Yes Related visit to physician within the last 2 weeks? ---Yes Does the PT have any chronic conditions? (i.e. diabetes, asthma, etc.) ---No Is the patient pregnant or possibly pregnant? (Ask all females between the ages of 68-55) ---No Is this a behavioral health or substance abuse call? ---No Guidelines Guideline Title Affirmed Question Affirmed Notes Nurse Date/Time Eilene Ghazi Time) Suture or Staple Questions Care of sutured wound, questions about Raphael Gibney, Therapist, sports, [place] 05/02/2016 2:50:55 PM Disp. Time Eilene Ghazi Time) Disposition Final User 05/02/2016 2:56:33 PM Home Care Yes Raphael Gibney, RN, Vanita Ingles PLEASE NOTE: All timestamps contained within this report are represented as [place] Standard Time. CONFIDENTIALTY NOTICE: This fax transmission is intended only for the addressee. It contains information that is legally privileged, confidential or otherwise protected from use or disclosure. If you are not the intended recipient, you are strictly prohibited from reviewing, disclosing, copying using or disseminating any of this information or taking any action in reliance on or regarding this information. If you have received this fax in error, please notify us immediately by telephone so that we can arrange for its return to [place]. Phone: [phone], Toll-Free: [phone], Fax: [phone] Page: 2 of 2 Call Id: [id number] Caller Understands: Yes Disagree/Comply: Comply Care Advice Given Per Guideline HOME CARE: You should be able to treat this at home. SUTURE CARE: * Keep sutured wound dry for first 24 hours (use sponge bath) PAIN MEDICINES: * For pain relief, take acetaminophen, ibuprofen, or naproxen. * Can get wound wet (but no bathing or swimming) after 24 hours. * Apply antibiotic  ointment (OTC) three  times a day (Reason: to prevent infection and thick scab - which increases scarring). CARE ADVICE given per Suture Questions (Adult) guideline * Dressing no longer needed when edge of wound closed (usually 48 hours). * Change wound dressing when wet or soiled. * Cleanse with warm water once daily or if becomes soiled. CALL BACK IF: * Looks infected (e.g., increasing redness, tenderness, pus-like drainage) * Fever * You have more questions. * Sutures come out early

## 2016-05-08 ENCOUNTER — Encounter: Payer: Self-pay | Admitting: Family Medicine

## 2016-05-08 ENCOUNTER — Ambulatory Visit (INDEPENDENT_AMBULATORY_CARE_PROVIDER_SITE_OTHER): Payer: BLUE CROSS/BLUE SHIELD | Admitting: Family Medicine

## 2016-05-08 VITALS — BP 114/62 | HR 62 | Temp 98.2°F | Wt 165.0 lb

## 2016-05-08 DIAGNOSIS — S61218A Laceration without foreign body of other finger without damage to nail, initial encounter: Secondary | ICD-10-CM | POA: Insufficient documentation

## 2016-05-08 DIAGNOSIS — Z4802 Encounter for removal of sutures: Secondary | ICD-10-CM | POA: Diagnosis not present

## 2016-05-08 DIAGNOSIS — Z23 Encounter for immunization: Secondary | ICD-10-CM

## 2016-05-08 DIAGNOSIS — S61218D Laceration without foreign body of other finger without damage to nail, subsequent encounter: Secondary | ICD-10-CM

## 2016-05-08 NOTE — Progress Notes (Signed)
Pre visit review using our clinic review tool, if applicable. No additional management support is needed unless otherwise documented below in the visit note. 

## 2016-05-08 NOTE — Assessment & Plan Note (Signed)
The wound is well healed without signs of infection.  The sutures are removed. Wound care and activity instructions given. Return prn.  

## 2016-05-08 NOTE — Assessment & Plan Note (Signed)
Healing well.  Sutures removed. Follow up as needed.

## 2016-05-08 NOTE — Progress Notes (Signed)
Subjective:   Patient ID: [name], female    DOB: 10-12-1960, 55 y.o.   MRN: [id number]  [name] is a pleasant 55 y.o. year old female who presents to clinic today with Follow-up ([facility]) and Suture / Staple Removal  on 05/08/2016  HPI:  Was seem at [facility] on 05/01/16 for laceration of left index finger which occurred while she was cutting an apple. Notes reviewed. Xray showed no acute bony abnormality.  3 sutures placed.  Pain has resolved.  Current Outpatient Prescriptions on File Prior to Visit  Medication Sig Dispense Refill  . ALPRAZolam (XANAX) 0.25 MG tablet 1 tab by mouth every morning as needed for anxiety and 2 tabs by mouth every evening as needed for insomnia 90 tablet 0  . baclofen (LIORESAL) 10 MG tablet as needed.     . cetirizine (ZYRTEC) 10 MG tablet Take 10 mg by mouth daily as needed for allergies.    . Magnesium Citrate 100 MG TABS Take 200 mg by mouth daily.    . Melatonin 10 MG TABS Take 10 mg by mouth at bedtime.    . Multiple Vitamin (MULTIVITAMIN) capsule Take 1 capsule by mouth daily.      . NON FORMULARY CGRP    . SUMAtriptan (IMITREX) 50 MG tablet Take 1 tablet (50 mg total) by mouth as needed. 10 tablet 6  . thyroid (ARMOUR) 65 MG tablet Take 1 tablet (65 mg total) by mouth daily. 90 tablet 2   No current facility-administered medications on file prior to visit.     No Known Allergies  Past Medical History:  Diagnosis Date  . Allergic rhinitis   . Cervical dysplasia 1990   s/p conization  . Chronic sinusitis   . Depression   . Fibromyalgia   . Hip fracture, left ([place]) 2005   s/p ORIF  . Hypothyroidism   . Migraine     Past Surgical History:  Procedure Laterality Date  . BREAST ENHANCEMENT SURGERY  2007  . DILATION AND CURETTAGE OF UTERUS  1989  . ORIF HIP FRACTURE  2005   Daldorf  . TONSILLECTOMY AND ADENOIDECTOMY      Family History  Problem Relation Age of Onset  . Arthritis Mother     RA and OA  . Cancer Maternal  Aunt 48    breast CA  . Colon cancer Neg Hx   . Pancreatic cancer Neg Hx   . Stomach cancer Neg Hx     Social History   Social History  . Marital status: Married    Spouse name: N/A  . Number of children: N/A  . Years of education: N/A   Occupational History  . Not on file.   Social History Main Topics  . Smoking status: Never Smoker  . Smokeless tobacco: Never Used  . Alcohol use Yes     Comment: rarely  . Drug use: No  . Sexual activity: Not on file   Other Topics Concern  . 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   The PMH, PSH, Social History, Family History, Medications, and allergies have been reviewed in [facility], and have been updated if relevant.   Review of Systems  Constitutional: Negative.   Musculoskeletal: Negative.   Neurological: Negative.   All other systems reviewed and are negative.      Objective:    BP 114/62   Pulse 62  Temp 98.2 F (36.8 C) (Oral)   Wt 165 lb (74.8 kg)   LMP 05/05/2016   SpO2 99%   BMI 24.02 kg/m    Physical Exam  Constitutional: She is oriented to person, place, and time. She appears well-developed and well-nourished. No distress.  HENT:  Head: Normocephalic.  Eyes: Conjunctivae are normal.  Cardiovascular: Normal rate.   Pulmonary/Chest: Effort normal.  Musculoskeletal: Normal range of motion.  Neurological: She is alert and oriented to person, place, and time.  Skin: She is not diaphoretic.  Tip of Right 3rd digit-  The wound is well healed without signs of infection.  The sutures are removed. Wound care and activity instructions given. Return prn.   Psychiatric: She has a normal mood and affect. Her behavior is normal. Judgment and thought content normal.  Nursing note and vitals reviewed.         Assessment & Plan:   Need for influenza vaccination - Plan: Flu Vaccine QUAD 36+ mos PF IM (Fluarix & Fluzone Quad PF)  Laceration of finger,  index, subsequent encounter  Encounter for removal of sutures No Follow-up on file.

## 2016-06-18 ENCOUNTER — Other Ambulatory Visit: Payer: Self-pay | Admitting: *Deleted

## 2016-06-18 MED ORDER — THYROID 65 MG PO TABS: 65.0000 mg | ORAL_TABLET | Freq: Every day | ORAL | 0 refills | Status: DC

## 2016-06-21 ENCOUNTER — Other Ambulatory Visit: Payer: Self-pay

## 2016-06-21 MED ORDER — FLUTICASONE PROPIONATE 50 MCG/ACT NA SUSP: 2.0000 | Freq: Every day | NASAL | 11 refills | Status: DC

## 2016-06-21 NOTE — Telephone Encounter (Signed)
Rx sent electronically.  

## 2016-06-24 ENCOUNTER — Ambulatory Visit (INDEPENDENT_AMBULATORY_CARE_PROVIDER_SITE_OTHER): Payer: BLUE CROSS/BLUE SHIELD | Admitting: Family Medicine

## 2016-06-24 ENCOUNTER — Encounter: Payer: Self-pay | Admitting: Family Medicine

## 2016-06-24 VITALS — BP 112/60 | HR 66 | Temp 98.3°F | Wt 168.5 lb

## 2016-06-24 DIAGNOSIS — J069 Acute upper respiratory infection, unspecified: Secondary | ICD-10-CM | POA: Diagnosis not present

## 2016-06-24 MED ORDER — DOXYCYCLINE HYCLATE 100 MG PO TABS
100.0000 mg | ORAL_TABLET | Freq: Two times a day (BID) | ORAL | 0 refills | Status: DC
Start: 2016-06-24 — End: 2016-07-17

## 2016-06-24 NOTE — Patient Instructions (Signed)
Great to see you. Take doxycyline 100 mg twice daily x 10 days.  Happy birthday weekend for your husband!

## 2016-06-24 NOTE — Progress Notes (Signed)
Pre visit review using our clinic review tool, if applicable. No additional management support is needed unless otherwise documented below in the visit note. 

## 2016-06-24 NOTE — Progress Notes (Signed)
SUBJECTIVE:  [name] is a 55 y.o. female who complains of coryza, congestion, sore throat, productive cough and bilateral sinus pain for 8 days. She denies a history of anorexia and chest pain and denies a history of asthma. Patient denies smoke cigarettes.   Current Outpatient Prescriptions on File Prior to Visit  Medication Sig Dispense Refill  . ALPRAZolam (XANAX) 0.25 MG tablet 1 tab by mouth every morning as needed for anxiety and 2 tabs by mouth every evening as needed for insomnia 90 tablet 0  . baclofen (LIORESAL) 10 MG tablet as needed.     . fluticasone (FLONASE) 50 MCG/ACT nasal spray Place 2 sprays into both nostrils daily. 16 g 11  . Magnesium Citrate 100 MG TABS Take 200 mg by mouth daily.    . Melatonin 10 MG TABS Take 10 mg by mouth at bedtime.    . Multiple Vitamin (MULTIVITAMIN) capsule Take 1 capsule by mouth daily.      . NON FORMULARY CGRP    . SUMAtriptan (IMITREX) 50 MG tablet Take 1 tablet (50 mg total) by mouth as needed. 10 tablet 6  . thyroid (ARMOUR) 65 MG tablet Take 1 tablet (65 mg total) by mouth daily. OFFICE VISIT WITH LABS REQUIRED FOR ADDITIONAL REFILLS 90 tablet 0   No current facility-administered medications on file prior to visit.     No Known Allergies  Past Medical History:  Diagnosis Date  . Allergic rhinitis   . Cervical dysplasia 1990   s/p conization  . Chronic sinusitis   . Depression   . Fibromyalgia   . Hip fracture, left ([place]) 2005   s/p ORIF  . Hypothyroidism   . Migraine     Past Surgical History:  Procedure Laterality Date  . BREAST ENHANCEMENT SURGERY  2007  . DILATION AND CURETTAGE OF UTERUS  1989  . ORIF HIP FRACTURE  2005   Daldorf  . TONSILLECTOMY AND ADENOIDECTOMY      Family History  Problem Relation Age of Onset  . Arthritis Mother     RA and OA  . Cancer Maternal Aunt 48    breast CA  . Colon cancer Neg Hx   . Pancreatic cancer Neg Hx   . Stomach cancer Neg Hx     Social History   Social  History  . Marital status: Married    Spouse name: N/A  . Number of children: N/A  . Years of education: N/A   Occupational History  . Not on file.   Social History Main Topics  . Smoking status: Never Smoker  . Smokeless tobacco: Never Used  . Alcohol use Yes     Comment: rarely  . Drug use: No  . Sexual activity: Not on file   Other Topics Concern  . 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   The PMH, PSH, Social History, Family History, Medications, and allergies have been reviewed in [facility], and have been updated if relevant.  OBJECTIVE: BP 112/60   Pulse 66   Temp 98.3 F (36.8 C) (Oral)   Wt 168 lb 8 oz (76.4 kg)   SpO2 100%   BMI 24.53 kg/m   She appears well, vital signs are as noted. Ears normal.  Throat and pharynx normal.  Neck supple. No adenopathy in the neck. Nose is congested. Sinuses tender. The chest is clear, without wheezes or rales.  ASSESSMENT:  sinusitis  PLAN:  Given duration and progression of symptoms, will treat for bacterial sinusitis with doxycycline.  Symptomatic therapy suggested: push fluids, rest and return office visit prn if symptoms persist or worsen. Call or return to clinic prn if these symptoms worsen or fail to improve as anticipated.

## 2016-06-26 ENCOUNTER — Encounter: Payer: Self-pay | Admitting: Family Medicine

## 2016-07-04 ENCOUNTER — Other Ambulatory Visit: Payer: Self-pay | Admitting: *Deleted

## 2016-07-04 MED ORDER — ALPRAZOLAM 0.25 MG PO TABS: ORAL_TABLET | ORAL | 0 refills | Status: DC

## 2016-07-04 NOTE — Telephone Encounter (Signed)
Rx called in to requested pharmacy 

## 2016-07-04 NOTE — Telephone Encounter (Signed)
Last f/u 10/2015 

## 2016-07-10 ENCOUNTER — Encounter: Payer: Self-pay | Admitting: Family Medicine

## 2016-07-10 ENCOUNTER — Other Ambulatory Visit: Payer: Self-pay | Admitting: Family Medicine

## 2016-07-10 MED ORDER — AMOXICILLIN-POT CLAVULANATE 875-125 MG PO TABS: 1.0000 | ORAL_TABLET | Freq: Two times a day (BID) | ORAL | 0 refills | Status: AC

## 2016-07-17 ENCOUNTER — Encounter: Payer: Self-pay | Admitting: Primary Care

## 2016-07-17 ENCOUNTER — Telehealth: Payer: Self-pay

## 2016-07-17 ENCOUNTER — Ambulatory Visit (INDEPENDENT_AMBULATORY_CARE_PROVIDER_SITE_OTHER): Payer: BLUE CROSS/BLUE SHIELD | Admitting: Primary Care

## 2016-07-17 VITALS — BP 118/80 | HR 74 | Temp 98.7°F | Wt 168.0 lb

## 2016-07-17 DIAGNOSIS — J3489 Other specified disorders of nose and nasal sinuses: Secondary | ICD-10-CM

## 2016-07-17 MED ORDER — DEXAMETHASONE SODIUM PHOSPHATE 10 MG/ML IJ SOLN
10.0000 mg | Freq: Once | INTRAMUSCULAR | Status: AC
  Administered 2016-07-17: 10 mg via INTRAMUSCULAR

## 2016-07-17 NOTE — Telephone Encounter (Signed)
Pt was scheduled with Allie Bossier NP today at 3:45. Pt notified and will keep that appt. FYI to Dr Deborra Medina and Allie Bossier NP.

## 2016-07-17 NOTE — Patient Instructions (Addendum)
Please e-mail either myself or Dr. Deborra Medina if your symptoms continue in 1 week.  Continue Allegra, nasal sprays as discussed.  It was a pleasure meeting you!

## 2016-07-17 NOTE — Progress Notes (Signed)
Subjective:    Patient ID: [name], female    DOB: Jan 01, 1961, 55 y.o.   MRN: [id number]  HPI  [name] is a 55 year old female who presents today with a chief complaint of sore throat. She also reports nasal congestion. She was evaluated on 10/30 with complaints of coryza, congestion, sore throat, cough that had been present for 8 days prior. She was diagnosed and treated for acute bacterial sinusitis with Doxycycline course. She sent an e-mail on 07/10/16 with continued sinus pressure since treatment and was prescribed Amoxicillin. She's continued her Amoxicillin and has several days left.   Yesterday she started feeling worse with sinus pressure, sore throat, nasal congestion, post nasal drip, fatigue, mild cough. Her congestion is a "mocha" in color. She's been taking Dayquil, using Flonase and saline nasal sprays, Allegra. She denies fevers. She's done well in the past with an injection of steroids.   Review of Systems  Constitutional: Positive for fatigue. Negative for chills and fever.  HENT: Positive for congestion, postnasal drip, sinus pressure and sore throat. Negative for ear pain.   Respiratory: Positive for cough. Negative for shortness of breath and wheezing.   Cardiovascular: Negative for chest pain.       Past Medical History:  Diagnosis Date  . Allergic rhinitis   . Cervical dysplasia 1990   s/p conization  . Chronic sinusitis   . Depression   . Fibromyalgia   . Hip fracture, left ([place]) 2005   s/p ORIF  . Hypothyroidism   . Migraine      Social History   Social History  . Marital status: Married    Spouse name: N/A  . Number of children: N/A  . Years of education: N/A   Occupational History  . Not on file.   Social History Main Topics  . Smoking status: Never Smoker  . Smokeless tobacco: Never Used  . Alcohol use Yes     Comment: rarely  . Drug use: No  . Sexual activity: Not on file   Other Topics Concern  . 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    Past Surgical History:  Procedure Laterality Date  . BREAST ENHANCEMENT SURGERY  2007  . DILATION AND CURETTAGE OF UTERUS  1989  . ORIF HIP FRACTURE  2005   [place]orf  . TONSILLECTOMY AND ADENOIDECTOMY      Family History  Problem Relation Age of Onset  . Arthritis Mother     RA and OA  . Cancer Maternal Aunt 48    breast CA  . Colon cancer Neg Hx   . Pancreatic cancer Neg Hx   . Stomach cancer Neg Hx     No Known Allergies  Current Outpatient Prescriptions on File Prior to Visit  Medication Sig Dispense Refill  . ALPRAZolam (XANAX) 0.25 MG tablet 1 tab by mouth every morning as needed for anxiety and 2 tabs by mouth every evening as needed for insomnia 90 tablet 0  . amoxicillin-clavulanate (AUGMENTIN) 875-125 MG tablet Take 1 tablet by mouth 2 (two) times daily. 20 tablet 0  . baclofen (LIORESAL) 10 MG tablet as needed.     . fluticasone (FLONASE) 50 MCG/ACT nasal spray Place 2 sprays into both nostrils daily. 16 g 11  . Magnesium Citrate 100 MG TABS Take 200 mg by mouth daily.    . Melatonin 10 MG TABS Take 10 mg by  mouth at bedtime.    . Multiple Vitamin (MULTIVITAMIN) capsule Take 1 capsule by mouth daily.      . NON FORMULARY CGRP    . SUMAtriptan (IMITREX) 50 MG tablet Take 1 tablet (50 mg total) by mouth as needed. 10 tablet 6  . thyroid (ARMOUR) 65 MG tablet Take 1 tablet (65 mg total) by mouth daily. OFFICE VISIT WITH LABS REQUIRED FOR ADDITIONAL REFILLS 90 tablet 0   No current facility-administered medications on file prior to visit.     BP 118/80   Pulse 74   Temp 98.7 F (37.1 C) (Oral)   Wt 168 lb (76.2 kg)   LMP 07/02/2016   SpO2 98%   BMI 24.45 kg/m    Objective:   Physical Exam  Constitutional: She appears well-nourished.  HENT:  Right Ear: Tympanic membrane and ear canal normal.  Left Ear: Tympanic membrane and ear canal normal.  Nose: Mucosal edema present.  Right sinus exhibits maxillary sinus tenderness. Right sinus exhibits no frontal sinus tenderness. Left sinus exhibits maxillary sinus tenderness. Left sinus exhibits no frontal sinus tenderness.  Mouth/Throat: Oropharynx is clear and moist.  Eyes: Conjunctivae are normal.  Neck: Neck supple.  Cardiovascular: Normal rate and regular rhythm.   Pulmonary/Chest: Effort normal and breath sounds normal. She has no wheezes. She has no rales.  Lymphadenopathy:    She has no cervical adenopathy.  Skin: Skin is warm and dry.          Assessment & Plan:  Sinusitis vs URI:  Continued sinus pressure, congestion, and fatigue. She's now on her second antibiotic with presence of these symptoms. Exam today with clear lungs, stable vitals, does not appear acutely ill. Maxillary sinus tenderness noted. Do not believe we need to alter her antibiotic as this point and will have her continue. Will provide IM Decardron 10 mg as this has been beneficial in the past. Discussed to continue nasal sprays and Allegra. May consider Singulair in the future if symptoms of PND and nasal congestion reoccur.   Sheral Flow, NP

## 2016-07-17 NOTE — Progress Notes (Signed)
Pre visit review using our clinic review tool, if applicable. No additional management support is needed unless otherwise documented below in the visit note. 

## 2016-07-17 NOTE — Telephone Encounter (Signed)
Pt last seen 06/24/16 and given second abx since that appt that pt is still taking; starting 11/21/17pt developed head congestion, prod cough with mocha colored phlegm and very S/T. Pt not sure about fever. Pt request to come to office to get an injection due to pt feeling so bad and pt has to prepare food for holiday or does pt need to be seen. Pt request cb ASAP. [place].

## 2016-07-17 NOTE — Telephone Encounter (Signed)
Noted  

## 2016-07-17 NOTE — Telephone Encounter (Signed)
She would need to be  Evaluated.  Does any one have open visits today?

## 2016-07-31 ENCOUNTER — Encounter: Payer: Self-pay | Admitting: Family Medicine

## 2016-07-31 ENCOUNTER — Other Ambulatory Visit: Payer: Self-pay | Admitting: Family Medicine

## 2016-07-31 MED ORDER — HYDROXYZINE HCL 50 MG PO TABS: 50.0000 mg | ORAL_TABLET | Freq: Four times a day (QID) | ORAL | 0 refills | Status: DC | PRN

## 2016-07-31 NOTE — Telephone Encounter (Signed)
This medication is no longer on her med list and she will need a new Rx from you to include dosing and instruction. pls advise

## 2016-08-05 ENCOUNTER — Other Ambulatory Visit: Payer: Self-pay | Admitting: Family Medicine

## 2016-08-05 DIAGNOSIS — Z01419 Encounter for gynecological examination (general) (routine) without abnormal findings: Secondary | ICD-10-CM

## 2016-08-05 DIAGNOSIS — E032 Hypothyroidism due to medicaments and other exogenous substances: Secondary | ICD-10-CM

## 2016-08-08 ENCOUNTER — Other Ambulatory Visit: Payer: BLUE CROSS/BLUE SHIELD

## 2016-08-12 ENCOUNTER — Other Ambulatory Visit (INDEPENDENT_AMBULATORY_CARE_PROVIDER_SITE_OTHER): Payer: BLUE CROSS/BLUE SHIELD

## 2016-08-12 ENCOUNTER — Other Ambulatory Visit: Payer: BLUE CROSS/BLUE SHIELD

## 2016-08-12 DIAGNOSIS — Z01419 Encounter for gynecological examination (general) (routine) without abnormal findings: Secondary | ICD-10-CM

## 2016-08-12 LAB — CBC WITH DIFFERENTIAL/PLATELET
BASOS PCT: 1 % (ref 0.0–3.0)
Basophils Absolute: 0 10*3/uL (ref 0.0–0.1)
EOS ABS: 0.2 10*3/uL (ref 0.0–0.7)
EOS PCT: 4.5 % (ref 0.0–5.0)
HEMATOCRIT: 38.7 % (ref 36.0–46.0)
HEMOGLOBIN: 13.2 g/dL (ref 12.0–15.0)
LYMPHS PCT: 39.4 % (ref 12.0–46.0)
Lymphs Abs: 1.9 10*3/uL (ref 0.7–4.0)
MCHC: 34.2 g/dL (ref 30.0–36.0)
MCV: 92.4 fl (ref 78.0–100.0)
Monocytes Absolute: 0.5 10*3/uL (ref 0.1–1.0)
Monocytes Relative: 9.3 % (ref 3.0–12.0)
NEUTROS ABS: 2.3 10*3/uL (ref 1.4–7.7)
Neutrophils Relative %: 45.8 % (ref 43.0–77.0)
PLATELETS: 234 10*3/uL (ref 150.0–400.0)
RBC: 4.19 Mil/uL (ref 3.87–5.11)
RDW: 14.5 % (ref 11.5–15.5)
WBC: 4.9 10*3/uL (ref 4.0–10.5)

## 2016-08-12 LAB — COMPREHENSIVE METABOLIC PANEL
ALT: 21 U/L (ref 0–35)
AST: 20 U/L (ref 0–37)
Albumin: 4.1 g/dL (ref 3.5–5.2)
Alkaline Phosphatase: 48 U/L (ref 39–117)
BUN: 18 mg/dL (ref 6–23)
CALCIUM: 9 mg/dL (ref 8.4–10.5)
CHLORIDE: 104 meq/L (ref 96–112)
CO2: 25 meq/L (ref 19–32)
Creatinine, Ser: 0.8 mg/dL (ref 0.40–1.20)
GFR: 79.55 mL/min (ref >60.00)
GLUCOSE: 66 mg/dL — AB (ref 70–99)
Potassium: 4.2 mEq/L (ref 3.5–5.1)
Sodium: 138 mEq/L (ref 135–145)
Total Bilirubin: 0.4 mg/dL (ref 0.2–1.2)
Total Protein: 6.7 g/dL (ref 6.0–8.3)

## 2016-08-12 LAB — T4, FREE: Free T4: 0.7 ng/dL (ref 0.60–1.60)

## 2016-08-12 LAB — LIPID PANEL
CHOL/HDL RATIO: 2
Cholesterol: 182 mg/dL (ref 0–200)
HDL: 81 mg/dL (ref >39.00)
LDL CALC: 93 mg/dL (ref 0–99)
NONHDL: 101
TRIGLYCERIDES: 42 mg/dL (ref 0.0–149.0)
VLDL: 8.4 mg/dL (ref 0.0–40.0)

## 2016-08-12 LAB — TSH: TSH: 1.74 u[IU]/mL (ref 0.35–4.50)

## 2016-08-12 LAB — VITAMIN D 25 HYDROXY (VIT D DEFICIENCY, FRACTURES): VITD: 36.73 ng/mL (ref 30.00–100.00)

## 2016-08-12 LAB — VITAMIN B12: Vitamin B-12: 373 pg/mL (ref 211–911)

## 2016-08-14 ENCOUNTER — Encounter: Payer: Self-pay | Admitting: Family Medicine

## 2016-08-14 ENCOUNTER — Ambulatory Visit (INDEPENDENT_AMBULATORY_CARE_PROVIDER_SITE_OTHER): Payer: BLUE CROSS/BLUE SHIELD | Admitting: Family Medicine

## 2016-08-14 VITALS — BP 104/64 | HR 79 | Temp 97.9°F | Ht 69.25 in | Wt 169.0 lb

## 2016-08-14 DIAGNOSIS — G43801 Other migraine, not intractable, with status migrainosus: Secondary | ICD-10-CM

## 2016-08-14 DIAGNOSIS — L299 Pruritus, unspecified: Secondary | ICD-10-CM

## 2016-08-14 DIAGNOSIS — F329 Major depressive disorder, single episode, unspecified: Secondary | ICD-10-CM | POA: Diagnosis not present

## 2016-08-14 DIAGNOSIS — G4700 Insomnia, unspecified: Secondary | ICD-10-CM

## 2016-08-14 DIAGNOSIS — F411 Generalized anxiety disorder: Secondary | ICD-10-CM | POA: Diagnosis not present

## 2016-08-14 DIAGNOSIS — E538 Deficiency of other specified B group vitamins: Secondary | ICD-10-CM

## 2016-08-14 DIAGNOSIS — Z01419 Encounter for gynecological examination (general) (routine) without abnormal findings: Secondary | ICD-10-CM

## 2016-08-14 DIAGNOSIS — E032 Hypothyroidism due to medicaments and other exogenous substances: Secondary | ICD-10-CM

## 2016-08-14 DIAGNOSIS — F32A Depression, unspecified: Secondary | ICD-10-CM

## 2016-08-14 NOTE — Assessment & Plan Note (Signed)
Improved

## 2016-08-14 NOTE — Assessment & Plan Note (Signed)
Well controlled 

## 2016-08-14 NOTE — Assessment & Plan Note (Signed)
Reviewed preventive care protocols, scheduled due services, and updated immunizations Discussed nutrition, exercise, diet, and healthy lifestyle.  Pt to call to schedule mammogram. 

## 2016-08-14 NOTE — Patient Instructions (Signed)
Great to see you. Merry Christmas!  Please call to schedule your mammogram.

## 2016-08-14 NOTE — Assessment & Plan Note (Signed)
Well controlled on low dose xanax as needed.

## 2016-08-14 NOTE — Progress Notes (Signed)
Subjective:    Patient ID: [name], female    DOB: 02/06/1961, 55 y.o.   MRN: [id number]  HPI  55 yo pleasant female here for CPX.  G2P2- last pap smear was 08/14/15 - done by me.  Was normal. S/p conization for cervical dysplasia in 1990.  Denies any vaginal complaints.   Last mammogram 07/01/13  Colonoscopy 07/13/13- Dr. Hilarie Fredrickson- 10 year recall-  No changes in bowel habits or blood in her stool.  Migraines- no longer followed by Dr. Joretta Bachelor. Migraines are much improved.  Now only taking Baclofen as needed and it is rare.  Insomnia- takes as needed xanax.  Hypothyroidism- On Armour 60 mg daily for hypothyroidism.  Denies any symptoms of hypo or hyperthyroidism.   Lab Results  Component Value Date   TSH 1.74 08/12/2016   Itching- legs and chest- seems to happen in winter.  Vistaril does help. Lab Results  Component Value Date   CHOL 182 08/12/2016   HDL 81.00 08/12/2016   LDLCALC 93 08/12/2016   TRIG 42.0 08/12/2016   CHOLHDL 2 08/12/2016     Lab Results  Component Value Date   CREATININE 0.80 08/12/2016   Lab Results  Component Value Date   ALT 21 08/12/2016   AST 20 08/12/2016   ALKPHOS 48 08/12/2016   BILITOT 0.4 08/12/2016   Lab Results  Component Value Date   WBC 4.9 08/12/2016   HGB 13.2 08/12/2016   HCT 38.7 08/12/2016   MCV 92.4 08/12/2016   PLT 234.0 08/12/2016     Patient Active Problem List  Diagnosis  . Depression  . Hypothyroidism  . Migraine with status migrainosus  . Insomnia  . Well woman exam  . Vitamin B12 deficiency  . Arthralgia  . Generalized anxiety disorder   Past Medical History:  Diagnosis Date  . Allergic rhinitis   . Cervical dysplasia 1990   s/p conization  . Chronic sinusitis   . Depression   . Fibromyalgia   . Hip fracture, left ([place]) 2005   s/p ORIF  . Hypothyroidism   . Migraine    Past Surgical History:  Procedure Laterality Date  . BREAST ENHANCEMENT SURGERY  2007  . DILATION AND CURETTAGE OF  UTERUS  1989  . ORIF HIP FRACTURE  2005   [place]orf  . TONSILLECTOMY AND ADENOIDECTOMY     Social History  Substance Use Topics  . Smoking status: Never Smoker  . Smokeless tobacco: Never Used  . Alcohol use Yes     Comment: rarely   Family History  Problem Relation Age of Onset  . Arthritis Mother     RA and OA  . Cancer Maternal Aunt 48    breast CA  . Colon cancer Neg Hx   . Pancreatic cancer Neg Hx   . Stomach cancer Neg Hx    No Known Allergies Current Outpatient Prescriptions on File Prior to Visit  Medication Sig Dispense Refill  . ALPRAZolam (XANAX) 0.25 MG tablet 1 tab by mouth every morning as needed for anxiety and 2 tabs by mouth every evening as needed for insomnia 90 tablet 0  . baclofen (LIORESAL) 10 MG tablet as needed.     . fluticasone (FLONASE) 50 MCG/ACT nasal spray Place 2 sprays into both nostrils daily. 16 g 11  . hydrOXYzine (ATARAX/VISTARIL) 50 MG tablet Take 1 tablet (50 mg total) by mouth every 6 (six) hours as needed. 30 tablet 0  . Magnesium Citrate 100 MG TABS Take 200 mg by  mouth daily.    . Melatonin 10 MG TABS Take 10 mg by mouth at bedtime.    . Multiple Vitamin (MULTIVITAMIN) capsule Take 1 capsule by mouth daily.      . NON FORMULARY CGRP    . SUMAtriptan (IMITREX) 50 MG tablet Take 1 tablet (50 mg total) by mouth as needed. 10 tablet 6  . thyroid (ARMOUR) 65 MG tablet Take 1 tablet (65 mg total) by mouth daily. OFFICE VISIT WITH LABS REQUIRED FOR ADDITIONAL REFILLS 90 tablet 0   No current facility-administered medications on file prior to visit.    The PMH, PSH, Social History, Family History, Medications, and allergies have been reviewed in [facility], and have been updated if relevant.   Review of Systems  Constitutional: Negative.   Respiratory: Negative.   Cardiovascular: Negative.   Gastrointestinal: Negative.   Genitourinary: Negative.   Musculoskeletal: Negative for myalgias.  Skin:       + itching   Neurological: Negative for  tremors, syncope, facial asymmetry, weakness and numbness.  All other systems reviewed and are negative.   Wt Readings from Last 3 Encounters:  08/14/16 169 lb (76.7 kg)  07/17/16 168 lb (76.2 kg)  06/24/16 168 lb 8 oz (76.4 kg)    Objective:   Physical Exam BP 104/64   Pulse 79   Temp 97.9 F (36.6 C) (Oral)   Ht 5' 9.25" (1.759 m)   Wt 169 lb (76.7 kg)   LMP 07/28/2016   SpO2 98%   BMI 24.78 kg/m  Wt Readings from Last 3 Encounters:  08/14/16 169 lb (76.7 kg)  07/17/16 168 lb (76.2 kg)  06/24/16 168 lb 8 oz (76.4 kg)    General:  Well-developed,well-nourished,in no acute distress; alert,appropriate and cooperative throughout examination Head:  normocephalic and atraumatic.   Eyes:  vision grossly intact, PERRL Ears:  R ear normal and L ear normal externally, TMs clear bilaterally Nose:  no external deformity.   Mouth:  good dentition.   Neck:  No deformities, masses, or tenderness noted. Breasts:  No mass, nodules, thickening, tenderness, bulging, retraction, inflamation, nipple discharge or skin changes noted.   +breast implants bilaterally Lungs:  Normal respiratory effort, chest expands symmetrically. Lungs are clear to auscultation, no crackles or wheezes. Heart:  Normal rate and regular rhythm. S1 and S2 normal without gallop, murmur, click, rub or other extra sounds. Abdomen:  Bowel sounds positive,abdomen soft and non-tender without masses, organomegaly or hernias noted. Msk:  No deformity or scoliosis noted of thoracic or lumbar spine.   Extremities:  No clubbing, cyanosis, edema, or deformity noted with normal full range of motion of all joints.   Neurologic:  alert & oriented X3 and gait normal.   Skin:  Intact without suspicious lesions or rashes Cervical Nodes:  No lymphadenopathy noted Axillary Nodes:  No palpable lymphadenopathy Psych:  Cognition and judgment appear intact. Alert and cooperative with normal attention span and concentration. No apparent  delusions, illusions, hallucinations     Assessment & Plan:

## 2016-08-14 NOTE — Assessment & Plan Note (Signed)
Euthyroid on current dose of Armour. No changes made today.

## 2016-09-25 ENCOUNTER — Other Ambulatory Visit: Payer: Self-pay | Admitting: *Deleted

## 2016-09-25 MED ORDER — THYROID 65 MG PO TABS: 65.0000 mg | ORAL_TABLET | Freq: Every day | ORAL | 2 refills | Status: DC

## 2016-11-27 ENCOUNTER — Encounter: Payer: Self-pay | Admitting: Family Medicine

## 2016-11-27 ENCOUNTER — Ambulatory Visit (INDEPENDENT_AMBULATORY_CARE_PROVIDER_SITE_OTHER): Payer: BLUE CROSS/BLUE SHIELD | Admitting: Family Medicine

## 2016-11-27 VITALS — BP 118/72 | HR 65 | Temp 98.0°F | Wt 160.5 lb

## 2016-11-27 DIAGNOSIS — J0110 Acute frontal sinusitis, unspecified: Secondary | ICD-10-CM

## 2016-11-27 MED ORDER — AMOXICILLIN-POT CLAVULANATE 875-125 MG PO TABS: 1.0000 | ORAL_TABLET | Freq: Two times a day (BID) | ORAL | 0 refills | Status: DC

## 2016-11-27 NOTE — Progress Notes (Signed)
SUBJECTIVE:  [name] is a 56 y.o. female who complains of coryza, congestion and bilateral sinus pain for 14 days. She denies a history of anorexia and chest pain and denies a history of asthma. Patient denies smoke cigarettes.   Has been using flonase, antihistamines/decongestants, saline nasal spray without much improvement in symptoms. Current Outpatient Prescriptions on File Prior to Visit  Medication Sig Dispense Refill  . ALPRAZolam (XANAX) 0.25 MG tablet 1 tab by mouth every morning as needed for anxiety and 2 tabs by mouth every evening as needed for insomnia 90 tablet 0  . baclofen (LIORESAL) 10 MG tablet as needed.     . fluticasone (FLONASE) 50 MCG/ACT nasal spray Place 2 sprays into both nostrils daily. 16 g 11  . hydrOXYzine (ATARAX/VISTARIL) 50 MG tablet Take 1 tablet (50 mg total) by mouth every 6 (six) hours as needed. 30 tablet 0  . Magnesium Citrate 100 MG TABS Take 200 mg by mouth daily.    . Melatonin 10 MG TABS Take 10 mg by mouth at bedtime.    . Multiple Vitamin (MULTIVITAMIN) capsule Take 1 capsule by mouth daily.      . NON FORMULARY CGRP    . SUMAtriptan (IMITREX) 50 MG tablet Take 1 tablet (50 mg total) by mouth as needed. 10 tablet 6  . thyroid (ARMOUR) 65 MG tablet Take 1 tablet (65 mg total) by mouth daily. 90 tablet 2   No current facility-administered medications on file prior to visit.     No Known Allergies  Past Medical History:  Diagnosis Date  . Allergic rhinitis   . Cervical dysplasia 1990   s/p conization  . Chronic sinusitis   . Depression   . Fibromyalgia   . Hip fracture, left ([place]) 2005   s/p ORIF  . Hypothyroidism   . Migraine     Past Surgical History:  Procedure Laterality Date  . BREAST ENHANCEMENT SURGERY  2007  . DILATION AND CURETTAGE OF UTERUS  1989  . ORIF HIP FRACTURE  2005   [place]  . TONSILLECTOMY AND ADENOIDECTOMY      Family History  Problem Relation Age of Onset  . Arthritis Mother     RA and OA  .  Cancer Maternal Aunt 48    breast CA  . Colon cancer Neg Hx   . Pancreatic cancer Neg Hx   . Stomach cancer Neg Hx     Social History   Social History  . Marital status: Married    Spouse name: N/A  . Number of children: N/A  . Years of education: N/A   Occupational History  . Not on file.   Social History Main Topics  . Smoking status: Never Smoker  . Smokeless tobacco: Never Used  . Alcohol use Yes     Comment: rarely  . Drug use: No  . Sexual activity: Not on file   Other Topics Concern  . 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   The PMH, PSH, Social History, Family History, Medications, and allergies have been reviewed in [facility], and have been updated if relevant.  OBJECTIVE:  BP 118/72   Pulse 65   Temp 98 F (36.7 C)   Wt 160 lb 8 oz (72.8 kg)   SpO2 97%   BMI 23.53 kg/m   She appears well, vital signs are as noted. Ears normal.  Throat and pharynx normal.  Neck  supple. No adenopathy in the neck. Nose is congested. Sinuses tender. The chest is clear, without wheezes or rales.  ASSESSMENT:  sinusitis  PLAN: Symptomatic therapy suggested: push fluids, rest and return office visit prn if symptoms persist or worsen.. Call or return to clinic prn if these symptoms worsen or fail to improve as anticipated.

## 2016-12-02 ENCOUNTER — Encounter: Payer: Self-pay | Admitting: Family Medicine

## 2016-12-23 ENCOUNTER — Other Ambulatory Visit: Payer: Self-pay

## 2016-12-23 MED ORDER — ALPRAZOLAM 0.25 MG PO TABS: ORAL_TABLET | ORAL | 0 refills | Status: DC

## 2016-12-23 NOTE — Telephone Encounter (Signed)
verbal refill given to Echo at pharmacy

## 2016-12-23 NOTE — Telephone Encounter (Signed)
Last filled 07-04-16 #90 Last OV 08-14-16 CPE No Future OV

## 2017-01-13 DIAGNOSIS — Z6823 Body mass index (BMI) 23.0-23.9, adult: Secondary | ICD-10-CM | POA: Diagnosis not present

## 2017-01-13 DIAGNOSIS — W64XXXA Exposure to other animate mechanical forces, initial encounter: Secondary | ICD-10-CM | POA: Diagnosis not present

## 2017-01-13 DIAGNOSIS — S0086XA Insect bite (nonvenomous) of other part of head, initial encounter: Secondary | ICD-10-CM | POA: Diagnosis not present

## 2017-01-15 ENCOUNTER — Encounter: Payer: Self-pay | Admitting: Family Medicine

## 2017-01-16 ENCOUNTER — Ambulatory Visit (INDEPENDENT_AMBULATORY_CARE_PROVIDER_SITE_OTHER): Payer: BLUE CROSS/BLUE SHIELD | Admitting: Family Medicine

## 2017-01-16 DIAGNOSIS — L089 Local infection of the skin and subcutaneous tissue, unspecified: Secondary | ICD-10-CM | POA: Diagnosis not present

## 2017-01-16 DIAGNOSIS — W57XXXA Bitten or stung by nonvenomous insect and other nonvenomous arthropods, initial encounter: Secondary | ICD-10-CM

## 2017-01-16 DIAGNOSIS — S1096XA Insect bite of unspecified part of neck, initial encounter: Secondary | ICD-10-CM

## 2017-01-16 MED ORDER — DOXYCYCLINE HYCLATE 100 MG PO TABS: 100.0000 mg | ORAL_TABLET | Freq: Two times a day (BID) | ORAL | 0 refills | Status: DC

## 2017-01-16 NOTE — Progress Notes (Signed)
Pre visit review using our clinic review tool, if applicable. No additional management support is needed unless otherwise documented below in the visit note. 158lb

## 2017-01-16 NOTE — Assessment & Plan Note (Signed)
New- place on extended course of doxycyline. Discussed signs of tick borne illness warranting follow up. The patient indicates understanding of these issues and agrees with the plan.

## 2017-01-16 NOTE — Progress Notes (Signed)
Subjective:   Patient ID: [name], female    DOB: 1960-09-09, 56 y.o.   MRN: [id number]  [name] is a pleasant 56 y.o. year old female who presents to clinic today with Insect Bite  on 01/16/2017  HPI:  Removed a tick from the back of her neck and area remained irritated.  Went to UC and was given one time dose of doxycyline.  Since then, area is now more red and more inflamed.  Lymph node above it is now swollen and tender.  No fevers, chills, headache, rashes, photophobia.  Current Outpatient Prescriptions on File Prior to Visit  Medication Sig Dispense Refill  . ALPRAZolam (XANAX) 0.25 MG tablet 1 tab by mouth every morning as needed for anxiety and 2 tabs by mouth every evening as needed for insomnia 90 tablet 0  . baclofen (LIORESAL) 10 MG tablet as needed.     . Cholecalciferol (VITAMIN D PO) Take by mouth daily.    . fluticasone (FLONASE) 50 MCG/ACT nasal spray Place 2 sprays into both nostrils daily. 16 g 11  . GLUCOSAMINE-CHONDROITIN PO Take by mouth daily.    . hydrOXYzine (ATARAX/VISTARIL) 50 MG tablet Take 1 tablet (50 mg total) by mouth every 6 (six) hours as needed. 30 tablet 0  . Magnesium Citrate 100 MG TABS Take 200 mg by mouth daily.    . Melatonin 10 MG TABS Take 10 mg by mouth at bedtime.    . Multiple Vitamin (MULTIVITAMIN) capsule Take 1 capsule by mouth daily.      . Omega-3 Fatty Acids (FISH OIL PO) Take by mouth daily.    . SUMAtriptan (IMITREX) 50 MG tablet Take 1 tablet (50 mg total) by mouth as needed. 10 tablet 6  . thyroid (ARMOUR) 65 MG tablet Take 1 tablet (65 mg total) by mouth daily. 90 tablet 2   No current facility-administered medications on file prior to visit.     No Known Allergies  Past Medical History:  Diagnosis Date  . Allergic rhinitis   . Cervical dysplasia 1990   s/p conization  . Chronic sinusitis   . Depression   . Fibromyalgia   . Hip fracture, left ([place]) 2005   s/p ORIF  . Hypothyroidism   . Migraine       Past Surgical History:  Procedure Laterality Date  . BREAST ENHANCEMENT SURGERY  2007  . DILATION AND CURETTAGE OF UTERUS  1989  . ORIF HIP FRACTURE  2005   D[place]orf  . TONSILLECTOMY AND ADENOIDECTOMY      Family History  Problem Relation Age of Onset  . Arthritis Mother        RA and OA  . Cancer Maternal Aunt 48       breast CA  . Colon cancer Neg Hx   . Pancreatic cancer Neg Hx   . Stomach cancer Neg Hx     Social History   Social History  . Marital status: Married    Spouse name: N/A  . Number of children: N/A  . Years of education: N/A   Occupational History  . Not on file.   Social History Main Topics  . Smoking status: Never Smoker  . Smokeless tobacco: Never Used  . Alcohol use Yes     Comment: rarely  . Drug use: No  . Sexual activity: Not on file   Other Topics Concern  . 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   The PMH, PSH, Social History, Family History, Medications, and allergies have been reviewed in [facility], and have been updated if relevant.   Review of Systems  Constitutional: Negative.   Skin: Positive for rash.  Hematological: Positive for adenopathy.  All other systems reviewed and are negative.      Objective:    BP 100/70   Pulse (!) 54   Temp 97.8 F (36.6 C)   Wt 158 lb (71.7 kg)   LMP 11/24/2016   SpO2 100%   BMI 23.16 kg/m    Physical Exam  Constitutional: She is oriented to person, place, and time. She appears well-developed and well-nourished. No distress.  HENT:  Head: Normocephalic and atraumatic.  Eyes: Conjunctivae are normal.  Cardiovascular: Normal rate.   Pulmonary/Chest: Effort normal.  Lymphadenopathy:    She has cervical adenopathy.       Right cervical: Posterior cervical adenopathy present.  Neurological: She is alert and oriented to person, place, and time. No cranial nerve deficit.  Skin: She is not diaphoretic.     Psychiatric: She  has a normal mood and affect. Her behavior is normal. Judgment and thought content normal.  Nursing note and vitals reviewed.         Assessment & Plan:   Infected tick bite of neck, initial encounter No Follow-up on file.

## 2017-05-20 ENCOUNTER — Encounter: Payer: Self-pay | Admitting: Family Medicine

## 2017-05-21 ENCOUNTER — Other Ambulatory Visit: Payer: Self-pay

## 2017-05-21 MED ORDER — ALPRAZOLAM 0.25 MG PO TABS: ORAL_TABLET | ORAL | 0 refills | Status: DC

## 2017-05-21 MED ORDER — TRIAMCINOLONE ACETONIDE 0.1 % EX CREA: 1.0000 "application " | TOPICAL_CREAM | Freq: Two times a day (BID) | CUTANEOUS | 0 refills | Status: DC

## 2017-05-21 NOTE — Addendum Note (Signed)
Addended by: Mady Haagensen on: 05/21/2017 12:44 PM   Modules accepted: Orders

## 2017-05-21 NOTE — Telephone Encounter (Signed)
Last refill 12/23/16  Last OV 01/16/17  Ok to refill?

## 2017-05-21 NOTE — Telephone Encounter (Signed)
Faxed xanax rx to [place], [address].Phone: [phone]

## 2017-06-19 ENCOUNTER — Telehealth: Payer: Self-pay | Admitting: Family Medicine

## 2017-06-19 ENCOUNTER — Ambulatory Visit: Payer: Self-pay | Admitting: *Deleted

## 2017-06-19 NOTE — Telephone Encounter (Signed)
I spoke to patient and let her know she could call the [place] office in late November to schedule her lab appointment.

## 2017-06-19 NOTE — Telephone Encounter (Signed)
  Answer Assessment - Initial Assessment Questions 1. BLOOD PRESSURE: "What is the blood pressure?" "Did you take at least two measurements 5 minutes apart?"    103/69 and 117/76 2. ONSET: "When did you take your blood pressure?"     Right now 3. HOW: "How did you obtain the blood pressure?" (e.g., visiting nurse, automatic home BP monitor)   Automatic home b/p monitor 4. HISTORY: "Do you have a history of low blood pressure?" "What is your blood pressure normally?"    No history 5. MEDICATIONS: "Are you taking any medications for blood pressure?" If yes: "Have they been changed recently?"     No meds 6. PULSE RATE: "Do you know what your pulse rate is?"      63 7. OTHER SYMPTOMS: "Have you been sick recently?" "Have you had a recent injury?"     no 8. PREGNANCY: "Is there any chance you are pregnant?" "When was your last menstrual period?"     no  Protocols used: LOW BLOOD PRESSURE-A-AH  Pt called and c/o that her b/p had been low and she was feeling a little "whirly". Pt states that she had been on the Keto diet for about a month and she had been diuresing quite a bit, like getting up at night up to 5 times to urinate. So she is trying to increase her fluids. She states that she had added some carbs to her diet and some sodium the previous night.  She had not checked her b/p so this RN asked her to check her b/p this morning. So with the first reading she was 103/69 while sitting. She stated at one reading, not today she had been 88/58. She denies being a diabetic. So at the second reading, 5 mins after the first one, she was 117/76.  She felt like she could manage at home with this reading, so feeling much better. So if she feels more symptoms today, she will give [place] a call back.

## 2017-06-19 NOTE — Telephone Encounter (Signed)
Copied from [place] #[id number]. Topic: Quick Communication - See Telephone Encounter >> Jun 19, 2017  8:15 AM Ether Griffins B wrote: CRM for notification. See Telephone encounter for:  06/19/17.  Scheduled pt for physical in Dec at [facility] with Dr. Deborra Medina and pt wants to know about having labs done before the physical so they can be reviewed at the date of her physical

## 2017-06-20 ENCOUNTER — Encounter: Payer: Self-pay | Admitting: Family Medicine

## 2017-06-23 ENCOUNTER — Encounter: Payer: Self-pay | Admitting: Family Medicine

## 2017-06-23 ENCOUNTER — Other Ambulatory Visit: Payer: Self-pay

## 2017-06-23 ENCOUNTER — Ambulatory Visit (INDEPENDENT_AMBULATORY_CARE_PROVIDER_SITE_OTHER): Payer: BLUE CROSS/BLUE SHIELD | Admitting: Family Medicine

## 2017-06-23 VITALS — BP 104/68 | HR 82 | Temp 98.5°F | Ht 69.25 in | Wt 167.4 lb

## 2017-06-23 DIAGNOSIS — I959 Hypotension, unspecified: Secondary | ICD-10-CM | POA: Diagnosis not present

## 2017-06-23 DIAGNOSIS — R5383 Other fatigue: Secondary | ICD-10-CM | POA: Diagnosis not present

## 2017-06-23 DIAGNOSIS — Z23 Encounter for immunization: Secondary | ICD-10-CM

## 2017-06-23 DIAGNOSIS — R351 Nocturia: Secondary | ICD-10-CM | POA: Insufficient documentation

## 2017-06-23 LAB — CBC WITH DIFFERENTIAL/PLATELET
BASOS ABS: 0.1 10*3/uL (ref 0.0–0.1)
BASOS PCT: 1.4 % (ref 0.0–3.0)
EOS ABS: 0.3 10*3/uL (ref 0.0–0.7)
Eosinophils Relative: 5.8 % — ABNORMAL HIGH (ref 0.0–5.0)
HEMATOCRIT: 40.5 % (ref 36.0–46.0)
HEMOGLOBIN: 13.4 g/dL (ref 12.0–15.0)
LYMPHS PCT: 35.4 % (ref 12.0–46.0)
Lymphs Abs: 1.8 10*3/uL (ref 0.7–4.0)
MCHC: 33 g/dL (ref 30.0–36.0)
MCV: 95.7 fl (ref 78.0–100.0)
Monocytes Absolute: 0.4 10*3/uL (ref 0.1–1.0)
Monocytes Relative: 8.7 % (ref 3.0–12.0)
NEUTROS ABS: 2.5 10*3/uL (ref 1.4–7.7)
Neutrophils Relative %: 48.7 % (ref 43.0–77.0)
PLATELETS: 247 10*3/uL (ref 150.0–400.0)
RBC: 4.24 Mil/uL (ref 3.87–5.11)
RDW: 14.4 % (ref 11.5–15.5)
WBC: 5.2 10*3/uL (ref 4.0–10.5)

## 2017-06-23 LAB — T4, FREE: FREE T4: 0.73 ng/dL (ref 0.60–1.60)

## 2017-06-23 LAB — COMPREHENSIVE METABOLIC PANEL
ALT: 14 U/L (ref 0–35)
AST: 17 U/L (ref 0–37)
Albumin: 4.1 g/dL (ref 3.5–5.2)
Alkaline Phosphatase: 40 U/L (ref 39–117)
BUN: 20 mg/dL (ref 6–23)
CALCIUM: 9.7 mg/dL (ref 8.4–10.5)
CHLORIDE: 104 meq/L (ref 96–112)
CO2: 25 meq/L (ref 19–32)
CREATININE: 0.87 mg/dL (ref 0.40–1.20)
GFR: 71.98 mL/min (ref >60.00)
Glucose, Bld: 100 mg/dL — ABNORMAL HIGH (ref 70–99)
Potassium: 4.4 mEq/L (ref 3.5–5.1)
SODIUM: 136 meq/L (ref 135–145)
Total Bilirubin: 0.5 mg/dL (ref 0.2–1.2)
Total Protein: 6.7 g/dL (ref 6.0–8.3)

## 2017-06-23 LAB — VITAMIN B12: VITAMIN B 12: 679 pg/mL (ref 211–911)

## 2017-06-23 LAB — POCT URINALYSIS DIPSTICK
BILIRUBIN UA: NEGATIVE
Glucose, UA: NEGATIVE
KETONES UA: NEGATIVE
Leukocytes, UA: NEGATIVE
Nitrite, UA: NEGATIVE
PH UA: 6 (ref 5.0–8.0)
Protein, UA: NEGATIVE
RBC UA: NEGATIVE
Spec Grav, UA: 1.015 (ref 1.010–1.025)
Urobilinogen, UA: 0.2 E.U./dL

## 2017-06-23 LAB — TSH: TSH: 2.08 u[IU]/mL (ref 0.35–4.50)

## 2017-06-23 LAB — HEMOGLOBIN A1C: HEMOGLOBIN A1C: 5.7 % (ref 4.6–6.5)

## 2017-06-23 MED ORDER — THYROID 65 MG PO TABS: 65.0000 mg | ORAL_TABLET | Freq: Every day | ORAL | 2 refills | Status: DC

## 2017-06-23 NOTE — Patient Instructions (Signed)
Great to see you. I will call you with your lab results.   

## 2017-06-23 NOTE — Progress Notes (Signed)
Subjective:   Patient ID: [name], female    DOB: 31-Aug-1960, 56 y.o.   MRN: [id number]  [name] is a pleasant 56 y.o. year old female who presents to clinic today with low blood pressure (Patient is here today C/O low BP.  She has taken it different times of the day and it generally runs 88/58 or 85/64.  She is also feeling very fatigued.  She states that she has had dizziness and headaches.  She has had excessive urination but has started a Keto diet but since episodes of increased urination has stopped but Sx persists.  She has squiggles in her peripheral vision when she has spells of lightheadedness.  She is needing to have a mammogram scheduled.)  on 06/23/2017  HPI:  Hypotension- feels BP has been running low- around 88/58 or 85/62. Has been taking it several times a day and still low. Feels very tired an dizzy, having headaches.  Feels she is having excessive urination.  Recently started a keto diet but stopped several days ago.  Thinks has been going on for several weeks, maybe a month. No results found for: HGBA1C   Wt Readings from Last 3 Encounters:  06/23/17 167 lb 6.4 oz (75.9 kg)  01/16/17 158 lb (71.7 kg)  11/27/16 160 lb 8 oz (72.8 kg)    Current Outpatient Prescriptions on File Prior to Visit  Medication Sig Dispense Refill  . ALPRAZolam (XANAX) 0.25 MG tablet 1 tab by mouth every morning as needed for anxiety and 2 tabs by mouth every evening as needed for insomnia 90 tablet 0  . baclofen (LIORESAL) 10 MG tablet as needed.     . Cholecalciferol (VITAMIN D PO) Take by mouth daily.    . fluticasone (FLONASE) 50 MCG/ACT nasal spray Place 2 sprays into both nostrils daily. 16 g 11  . GLUCOSAMINE-CHONDROITIN PO Take by mouth daily.    . hydrOXYzine (ATARAX/VISTARIL) 50 MG tablet Take 1 tablet (50 mg total) by mouth every 6 (six) hours as needed. 30 tablet 0  . Magnesium Citrate 100 MG TABS Take 200 mg by mouth daily.    . Melatonin 10 MG TABS Take 10  mg by mouth at bedtime.    . Multiple Vitamin (MULTIVITAMIN) capsule Take 1 capsule by mouth daily.      . Omega-3 Fatty Acids (FISH OIL PO) Take by mouth daily.    . SUMAtriptan (IMITREX) 50 MG tablet Take 1 tablet (50 mg total) by mouth as needed. 10 tablet 6  . thyroid (ARMOUR) 65 MG tablet Take 1 tablet (65 mg total) by mouth daily. 90 tablet 2  . triamcinolone cream (KENALOG) 0.1 % Apply 1 application topically 2 (two) times daily. 30 g 0   No current facility-administered medications on file prior to visit.     No Known Allergies  Past Medical History:  Diagnosis Date  . Allergic rhinitis   . Cervical dysplasia 1990   s/p conization  . Chronic sinusitis   . Depression   . Fibromyalgia   . Hip fracture, left ([place]) 2005   s/p ORIF  . Hypothyroidism   . Migraine     Past Surgical History:  Procedure Laterality Date  . BREAST ENHANCEMENT SURGERY  2007  . DILATION AND CURETTAGE OF UTERUS  1989  . ORIF HIP FRACTURE  2005   [place]  . TONSILLECTOMY AND ADENOIDECTOMY      Family History  Problem Relation Age of Onset  . Arthritis Mother  RA and OA  . Cancer Maternal Aunt 48       breast CA  . Colon cancer Neg Hx   . Pancreatic cancer Neg Hx   . Stomach cancer Neg Hx     Social History   Social History  . Marital status: Married    Spouse name: N/A  . Number of children: N/A  . Years of education: N/A   Occupational History  . Not on file.   Social History Main Topics  . Smoking status: Never Smoker  . Smokeless tobacco: Never Used  . Alcohol use Yes     Comment: rarely  . Drug use: No  . Sexual activity: Not on file   Other Topics Concern  . 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   The PMH, PSH, Social History, Family History, Medications, and allergies have been reviewed in [facility], and have been updated if relevant.   Review of Systems  Constitutional: Positive for fatigue.  Negative for unexpected weight change.  HENT: Negative.   Eyes: Positive for visual disturbance.  Respiratory: Negative.   Cardiovascular: Negative.   Gastrointestinal: Negative.   Endocrine: Positive for polyuria.  Genitourinary: Negative.   Neurological: Positive for dizziness and headaches. Negative for tremors, seizures, syncope, facial asymmetry, speech difficulty, weakness, light-headedness and numbness.  Hematological: Negative.   Psychiatric/Behavioral: Negative.   All other systems reviewed and are negative.      Objective:    BP 104/68 (BP Location: Left Arm, Patient Position: Sitting, Cuff Size: Normal) Comment: Pt cuff 107/72  Pulse 82   Temp 98.5 F (36.9 C) (Oral)   Ht 5' 9.25" (1.759 m)   Wt 167 lb 6.4 oz (75.9 kg)   LMP 06/07/2017   SpO2 99%   BMI 24.54 kg/m    Physical Exam   General:  Well-developed,well-nourished,in no acute distress; alert,appropriate and cooperative throughout examination Head:  normocephalic and atraumatic.   Eyes:  vision grossly intact, PERRL Ears:  R ear normal and L ear normal externally, TMs clear bilaterally Nose:  no external deformity.   Mouth:  good dentition.   Neck:  No deformities, masses, or tenderness noted. Lungs:  Normal respiratory effort, chest expands symmetrically. Lungs are clear to auscultation, no crackles or wheezes. Heart:  Normal rate and regular rhythm. S1 and S2 normal without gallop, murmur, click, rub or other extra sounds. Abdomen:  Bowel sounds positive,abdomen soft and non-tender without masses, organomegaly or hernias noted. Msk:  No deformity or scoliosis noted of thoracic or lumbar spine.   Extremities:  No clubbing, cyanosis, edema, or deformity noted with normal full range of motion of all joints.   Neurologic:  alert & oriented X3 and gait normal.   Skin:  Intact without suspicious lesions or rashes Cervical Nodes:  No lymphadenopathy noted Axillary Nodes:  No palpable lymphadenopathy Psych:   Cognition and judgment appear intact. Alert and cooperative with normal attention span and concentration. No apparent delusions, illusions, hallucinations       Assessment & Plan:   Hypotension, unspecified hypotension type - Plan: Hemoglobin A1c, Comprehensive metabolic panel, CBC with Differential/Platelet, TSH, T4, Free, Thyroid antibodies  Need for influenza vaccination - Plan: Flu Vaccine QUAD 6+ mos PF IM (Fluarix Quad PF)  Excessive urination at night  Other fatigue - Plan: Vitamin B12 No Follow-up on file.

## 2017-06-23 NOTE — Assessment & Plan Note (Signed)
Normotensive here today. Brings in home BP cuff and it does seem accurate. Will start out with labs today. The patient indicates understanding of these issues and agrees with the plan. Orders Placed This Encounter  Procedures  . Flu Vaccine QUAD 6+ mos PF IM (Fluarix Quad PF)  . Hemoglobin A1c  . Comprehensive metabolic panel  . CBC with Differential/Platelet  . TSH  . T4, Free  . Thyroid antibodies  . Vitamin B12

## 2017-06-24 ENCOUNTER — Encounter: Payer: Self-pay | Admitting: Family Medicine

## 2017-06-24 LAB — THYROID ANTIBODIES
THYROID PEROXIDASE ANTIBODY: 88 [IU]/mL — AB (ref <9)
Thyroglobulin Ab: <1 IU/mL (ref <=1)

## 2017-06-25 ENCOUNTER — Encounter: Payer: Self-pay | Admitting: Family Medicine

## 2017-06-26 ENCOUNTER — Other Ambulatory Visit: Payer: Self-pay | Admitting: Family Medicine

## 2017-06-26 ENCOUNTER — Encounter: Payer: Self-pay | Admitting: Family Medicine

## 2017-06-26 DIAGNOSIS — R7989 Other specified abnormal findings of blood chemistry: Secondary | ICD-10-CM

## 2017-08-25 ENCOUNTER — Other Ambulatory Visit (HOSPITAL_COMMUNITY)
Admission: RE | Admit: 2017-08-25 | Discharge: 2017-08-25 | Disposition: A | Discharge status: Home / Self Care | Payer: BLUE CROSS/BLUE SHIELD | Source: Ambulatory Visit | Attending: Family Medicine | Admitting: Family Medicine

## 2017-08-25 ENCOUNTER — Encounter: Payer: Self-pay | Admitting: Family Medicine

## 2017-08-25 ENCOUNTER — Ambulatory Visit: Payer: BLUE CROSS/BLUE SHIELD | Admitting: Family Medicine

## 2017-08-25 VITALS — BP 108/70 | HR 82 | Temp 98.4°F | Ht 69.5 in | Wt 172.2 lb

## 2017-08-25 DIAGNOSIS — B373 Candidiasis of vulva and vagina: Secondary | ICD-10-CM | POA: Insufficient documentation

## 2017-08-25 DIAGNOSIS — F329 Major depressive disorder, single episode, unspecified: Secondary | ICD-10-CM

## 2017-08-25 DIAGNOSIS — E032 Hypothyroidism due to medicaments and other exogenous substances: Secondary | ICD-10-CM

## 2017-08-25 DIAGNOSIS — Z01419 Encounter for gynecological examination (general) (routine) without abnormal findings: Secondary | ICD-10-CM | POA: Insufficient documentation

## 2017-08-25 DIAGNOSIS — N760 Acute vaginitis: Secondary | ICD-10-CM | POA: Diagnosis not present

## 2017-08-25 DIAGNOSIS — B9689 Other specified bacterial agents as the cause of diseases classified elsewhere: Secondary | ICD-10-CM | POA: Diagnosis not present

## 2017-08-25 DIAGNOSIS — J019 Acute sinusitis, unspecified: Secondary | ICD-10-CM | POA: Insufficient documentation

## 2017-08-25 DIAGNOSIS — E538 Deficiency of other specified B group vitamins: Secondary | ICD-10-CM

## 2017-08-25 DIAGNOSIS — F32A Depression, unspecified: Secondary | ICD-10-CM

## 2017-08-25 DIAGNOSIS — J0181 Other acute recurrent sinusitis: Secondary | ICD-10-CM

## 2017-08-25 MED ORDER — AMOXICILLIN-POT CLAVULANATE 875-125 MG PO TABS: 1.0000 | ORAL_TABLET | Freq: Two times a day (BID) | ORAL | 0 refills | Status: AC

## 2017-08-25 MED ORDER — METHYLPREDNISOLONE ACETATE 40 MG/ML IJ SUSP
40.0000 mg | Freq: Once | INTRAMUSCULAR | Status: AC
  Administered 2017-08-25: 40 mg via INTRAMUSCULAR

## 2017-08-25 NOTE — Assessment & Plan Note (Signed)
Given duration and progression of symptoms, will treat for bacterial sinusitis with Augmentin, IM deprol Medrol.

## 2017-08-25 NOTE — Patient Instructions (Addendum)
Great to see you.  We will call you with your results from today and you can view them online.   Please call the breast center at [phone] to schedule your mammogram.  Please take Augmentin as directed- 1 tablet twice daily for 10 days.   Happy New year!

## 2017-08-25 NOTE — Assessment & Plan Note (Signed)
Reviewed preventive care protocols, scheduled due services, and updated immunizations Discussed nutrition, exercise, diet, and healthy lifestyle.  Discussed USPSTF recommendations of cervical cancer screening.  She is aware that interval of 3 years is recommended but pt would prefer to have pap smear done today.  

## 2017-08-25 NOTE — Progress Notes (Signed)
Subjective:   Patient ID: [name], female    DOB: 01-Dec-1960, 56 y.o.   MRN: [id number]  DYLANIE [name] is a pleasant 56 y.o. year old female who presents to clinic today with Annual Exam (Patient is here today for a CPE with PAP. She had fasting labs in October. )  on 08/25/2017  HPI:  Last pap smear 08/14/15 LMP on Friday Mammogram 07/01/13  Colonoscopy 07/13/13  ?sinusitis- progressive sinus pressure and tooth pain for 10 days.  Had URI prior to these symptoms.  No fever.  Has had some decreased appetite.  Health Maintenance  Topic Date Due  . MAMMOGRAM  07/02/2015  . PAP SMEAR  08/13/2018  . COLONOSCOPY  07/14/2023  . TETANUS/TDAP  05/02/2026  . INFLUENZA VACCINE  Completed  . Hepatitis C Screening  Completed  . HIV Screening  Completed      Current Outpatient Medications on File Prior to Visit  Medication Sig Dispense Refill  . ALPRAZolam (XANAX) 0.25 MG tablet 1 tab by mouth every morning as needed for anxiety and 2 tabs by mouth every evening as needed for insomnia 90 tablet 0  . baclofen (LIORESAL) 10 MG tablet as needed.     . Cholecalciferol (VITAMIN D PO) Take by mouth daily.    . fluticasone (FLONASE) 50 MCG/ACT nasal spray Place 2 sprays into both nostrils daily. 16 g 11  . GLUCOSAMINE-CHONDROITIN PO Take by mouth daily.    Marland Kitchen GLUTATHIONE PO Take 2 tablets by mouth 2 (two) times daily.    . hydrOXYzine (ATARAX/VISTARIL) 50 MG tablet Take 1 tablet (50 mg total) by mouth every 6 (six) hours as needed. 30 tablet 0  . Magnesium Citrate 100 MG TABS Take 200 mg by mouth daily.    . Melatonin 10 MG TABS Take 10 mg by mouth at bedtime.    . Multiple Vitamin (MULTIVITAMIN) capsule Take 1 capsule by mouth daily.      . Omega-3 Fatty Acids (FISH OIL PO) Take by mouth daily.    . SUMAtriptan (IMITREX) 50 MG tablet Take 1 tablet (50 mg total) by mouth as needed. 10 tablet 6  . thyroid (ARMOUR) 65 MG tablet Take 1 tablet (65 mg total) by mouth daily. 90 tablet 2  .  triamcinolone cream (KENALOG) 0.1 % Apply 1 application topically 2 (two) times daily. 30 g 0  . TURMERIC CURCUMIN PO Take 1 tablet by mouth daily.     No current facility-administered medications on file prior to visit.     No Known Allergies  Past Medical History:  Diagnosis Date  . Allergic rhinitis   . Cervical dysplasia 1990   s/p conization  . Chronic sinusitis   . Depression   . Fibromyalgia   . Hip fracture, left ([place]) 2005   s/p ORIF  . Hypothyroidism   . Migraine     Past Surgical History:  Procedure Laterality Date  . BREAST ENHANCEMENT SURGERY  2007  . DILATION AND CURETTAGE OF UTERUS  1989  . ORIF HIP FRACTURE  2005   [place]  . TONSILLECTOMY AND ADENOIDECTOMY      Family History  Problem Relation Age of Onset  . Arthritis Mother        RA and OA  . Cancer Maternal Aunt 48       breast CA  . Colon cancer Neg Hx   . Pancreatic cancer Neg Hx   . Stomach cancer Neg Hx     Social History  Socioeconomic History  . Marital status: Married    Spouse name: Not on file  . Number of children: Not on file  . Years of education: Not on file  . Highest education level: Not on file  Social Needs  . Financial resource strain: Not on file  . Food insecurity - worry: Not on file  . Food insecurity - inability: Not on file  . Transportation needs - medical: Not on file  . Transportation needs - non-medical: Not on file  Occupational History  . Not on file  Tobacco Use  . Smoking status: Never Smoker  . Smokeless tobacco: Never Used  Substance and Sexual Activity  . Alcohol use: Yes    Comment: rarely  . Drug use: No  . Sexual activity: Not on file  Other Topics Concern  . 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   The PMH, PSH, Social History, Family History, Medications, and allergies have been reviewed in [facility], and have been updated if relevant.   Review of Systems  Constitutional:  Negative.   HENT: Positive for postnasal drip, rhinorrhea, sinus pressure and sinus pain. Negative for congestion, dental problem, drooling, ear discharge, ear pain, facial swelling, sneezing, sore throat, tinnitus, trouble swallowing and voice change.   Respiratory: Negative.   Cardiovascular: Negative.   Gastrointestinal: Negative.   Endocrine: Negative.   Genitourinary: Negative.   Musculoskeletal: Negative.   Allergic/Immunologic: Negative.   Neurological: Negative.   Hematological: Negative.   Psychiatric/Behavioral: Negative.   All other systems reviewed and are negative.      Objective:    BP 108/70 (BP Location: Left Arm, Patient Position: Sitting, Cuff Size: Normal)   Pulse 82   Temp 98.4 F (36.9 C) (Oral)   Ht 5' 9.5" (1.765 m)   Wt 172 lb 3.2 oz (78.1 kg)   LMP 08/22/2017   SpO2 98%   BMI 25.06 kg/m    Physical Exam   General:  Well-developed,well-nourished,in no acute distress; alert,appropriate and cooperative throughout examination Head:  normocephalic and atraumatic.   Eyes:  vision grossly intact, PERRL Ears:  R ear normal and L ear normal externally, TMs clear bilaterally Nose:  no external deformity.  Sinuses TTP bilaterally   Mouth:  good dentition.   Neck:  No deformities, masses, or tenderness noted. Breasts:  No mass, nodules, thickening, tenderness, bulging, retraction, inflamation, nipple discharge or skin changes noted.   Lungs:  Normal respiratory effort, chest expands symmetrically. Lungs are clear to auscultation, no crackles or wheezes. Heart:  Normal rate and regular rhythm. S1 and S2 normal without gallop, murmur, click, rub or other extra sounds. Abdomen:  Bowel sounds positive,abdomen soft and non-tender without masses, organomegaly or hernias noted. Rectal:  no external abnormalities.   Genitalia:  Pelvic Exam:        External: normal female genitalia without lesions or masses        Vagina: normal without lesions or masses         Cervix: normal without lesions or masses        Adnexa: normal bimanual exam without masses or fullness        Uterus: normal by palpation        Pap smear: performed Msk:  No deformity or scoliosis noted of thoracic or lumbar spine.   Extremities:  No clubbing, cyanosis, edema, or deformity noted with normal full range of motion of all joints.   Neurologic:  alert & oriented X3 and gait normal.   Skin:  Intact without suspicious lesions or rashes Cervical Nodes:  No lymphadenopathy noted Axillary Nodes:  No palpable lymphadenopathy Psych:  Cognition and judgment appear intact. Alert and cooperative with normal attention span and concentration. No apparent delusions, illusions, hallucinations       Assessment & Plan:   Hypothyroidism due to medication  Well woman exam - Plan: Cytology - PAP  Vitamin B12 deficiency  Depression, unspecified depression type No Follow-up on file.

## 2017-08-28 ENCOUNTER — Telehealth: Payer: Self-pay | Admitting: Family Medicine

## 2017-08-28 ENCOUNTER — Encounter: Payer: Self-pay | Admitting: Family Medicine

## 2017-08-28 LAB — CYTOLOGY - PAP
BACTERIAL VAGINITIS: POSITIVE — AB
CANDIDA VAGINITIS: POSITIVE — AB
DIAGNOSIS: NEGATIVE
HPV (WINDOPATH): NOT DETECTED

## 2017-08-28 MED ORDER — FLUCONAZOLE 150 MG PO TABS: 150.0000 mg | ORAL_TABLET | Freq: Once | ORAL | 0 refills | Status: AC

## 2017-08-28 MED ORDER — METRONIDAZOLE 500 MG PO TABS: 500.0000 mg | ORAL_TABLET | Freq: Two times a day (BID) | ORAL | 0 refills | Status: AC

## 2017-08-28 NOTE — Telephone Encounter (Signed)
Spoke with pt/sent results back to TA with question/sent in Rx's Flagyl & Diflucan but she will wait till I call her back before starting/thx dmf

## 2017-08-28 NOTE — Telephone Encounter (Signed)
Copied from [place] [id number]. Topic: Quick Communication - See Telephone Encounter >> Aug 28, 2017  3:35 PM Bea Graff, NT wrote: CRM for notification. See Telephone encounter for: Pt would like a call with the pap smear results and to go over the MyChart message she received today.   08/28/17.

## 2017-08-29 NOTE — Telephone Encounter (Signed)
Pt aware that per TA to complete course of Augmentin first then start the Flagyl/thx dmf

## 2017-09-10 DIAGNOSIS — Z1283 Encounter for screening for malignant neoplasm of skin: Secondary | ICD-10-CM | POA: Diagnosis not present

## 2017-09-10 DIAGNOSIS — L821 Other seborrheic keratosis: Secondary | ICD-10-CM | POA: Diagnosis not present

## 2017-09-11 ENCOUNTER — Other Ambulatory Visit: Payer: Self-pay

## 2017-09-11 MED ORDER — FLUTICASONE PROPIONATE 50 MCG/ACT NA SUSP: 2.0000 | Freq: Every day | NASAL | 11 refills | Status: DC

## 2017-09-24 ENCOUNTER — Encounter: Payer: Self-pay | Admitting: Internal Medicine

## 2017-09-24 ENCOUNTER — Ambulatory Visit: Payer: BLUE CROSS/BLUE SHIELD | Admitting: Internal Medicine

## 2017-09-24 VITALS — BP 96/70 | HR 77 | Ht 69.5 in | Wt 170.0 lb

## 2017-09-24 DIAGNOSIS — E063 Autoimmune thyroiditis: Secondary | ICD-10-CM

## 2017-09-24 DIAGNOSIS — R7989 Other specified abnormal findings of blood chemistry: Secondary | ICD-10-CM | POA: Diagnosis not present

## 2017-09-24 DIAGNOSIS — E038 Other specified hypothyroidism: Secondary | ICD-10-CM | POA: Diagnosis not present

## 2017-09-24 MED ORDER — LEVOTHYROXINE SODIUM 100 MCG PO TABS: 100.0000 ug | ORAL_TABLET | Freq: Every day | ORAL | 3 refills | Status: DC

## 2017-09-24 NOTE — Progress Notes (Signed)
Patient ID: [name], female   DOB: Jun 26, 1961, 57 y.o.   MRN: [id number]    HPI  [name] is a 57 y.o.-year-old female, referred by her PCP, Dr. Deborra Medina, for management of hypothyroidism.  She would also want to discuss abnormal cortisol level found on evaluation by integrative medicine.  Pt. has been dx with hypothyroidism in ~2008 (Dr. Franchot Mimes) - borderline tests then >> on Armour 60 mg daily.  She has not tried levothyroxine/Synthroid.  She takes the thyroid hormone: - fasting, 4-5 am - with water - separated by >30 min from b'fast  - no calcium, iron, PPIs - + multivitamins approximately 2 hours later  I reviewed pt's thyroid tests - normal: Lab Results  Component Value Date   TSH 2.08 06/23/2017   TSH 1.74 08/12/2016   TSH 1.97 08/10/2015   TSH 1.20 06/28/2014   TSH 1.07 12/28/2013   TSH 2.06 06/03/2013   TSH 1.69 03/06/2012   TSH 1.22 03/04/2011   FREET4 0.73 06/23/2017   FREET4 0.70 08/12/2016   FREET4 0.88 06/28/2014   FREET4 0.85 12/28/2013   FREET4 0.83 06/03/2013    Ab's were positive: Component     Latest Ref Rng & Units 06/23/2017  Thyroglobulin Ab     < or = 1 IU/mL <1  Thyroperoxidase Ab SerPl-aCnc     <9 IU/mL 88 (H)   Pt describes: - + extreme fatigue, + poor sleep (problems staying asleep) - + brain fog, + memory pbs - no weight gain or loss - + Cold intolerance, But hot flashes  - + Anxiety, no depression - no constipation - + dry skin - + hair thinning - + neuropathy hands and feet - + mm and joint aches  Pt denies feeling nodules in neck, hoarseness, odynophagia, SOB with lying down.  She has occasional dysphagia with dry foods.  She has + FH of thyroid disorders in: mother (hypothyroidism). No FH of thyroid cancer.  No h/o radiation tx to head or neck. Off iodine supplements. On Biotin >> stopped 2 mo ago. On AdrenoMend supplement for 2-3 weeks, now off for 2 weeks.  Mother has RA.  She has other relatives with autoimmune  diseases, also.  She had evaluation by an independent lab salivary cortisol and cortisone: Levels.  Her salivary cortisol was slightly high in the morning and slightly low in the afternoon.  Rest of the values were normal.  Of note, a  DHEAS level was also normal.  We will scan the results.  ROS: Constitutional: + See HPI Eyes: no blurry vision, no xerophthalmia ENT: no sore throat, no nodules palpated in throat, no dysphagia/odynophagia, no hoarseness, + tinnitus Cardiovascular: no CP/SOB/palpitations/leg swelling Respiratory: no cough/SOB Gastrointestinal: no N/V/D/C Musculoskeletal: + both: muscle/joint aches Skin: no rashes, + hair thinning Neurological: no tremors/+ numbness/+ tingling/no dizziness Psychiatric: no depression/+ anxiety  Past Medical History:  Diagnosis Date  . Allergic rhinitis   . Cervical dysplasia 1990   s/p conization  . Chronic sinusitis   . Depression   . Fibromyalgia   . Hip fracture, left ([place]) 2005   s/p ORIF  . Hypothyroidism   . Migraine    Past Surgical History:  Procedure Laterality Date  . BREAST ENHANCEMENT SURGERY  2007  . DILATION AND CURETTAGE OF UTERUS  1989  . ORIF HIP FRACTURE  2005   [place]  . TONSILLECTOMY AND ADENOIDECTOMY     Social History   Tobacco Use  . Smoking status: Never Smoker  .  Smokeless tobacco: Never Used  Substance and Sexual Activity  . Alcohol use: Yes    Comment: rarely  . Drug use: No  . Sexual activity: Not on file  Other Topics Concern  . 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 baclofen (LIORESAL) 10 MG tablet as needed.     . cetirizine (ZYRTEC) 10 MG tablet Take 10 mg by mouth at bedtime.    . Cholecalciferol (VITAMIN D PO) Take by mouth daily.    . fexofenadine (ALLEGRA) 60 MG tablet Take 60 mg by mouth daily.    . fluticasone  (FLONASE) 50 MCG/ACT nasal spray Place 2 sprays into both nostrils daily. 16 g 11  . GLUCOSAMINE-CHONDROITIN PO Take by mouth daily.    Marland Kitchen GLUTATHIONE PO Take 2 tablets by mouth 2 (two) times daily.    . Magnesium Citrate 100 MG TABS Take 200 mg by mouth daily.    . Melatonin 10 MG TABS Take 10 mg by mouth at bedtime.    . Multiple Vitamin (MULTIVITAMIN) capsule Take 1 capsule by mouth daily.      . Omega-3 Fatty Acids (FISH OIL PO) Take by mouth daily.    Marland Kitchen thyroid (ARMOUR) 65 MG tablet Take 1 tablet (65 mg total) by mouth daily. 90 tablet 2  . TURMERIC CURCUMIN PO Take 1 tablet by mouth daily.    Marland Kitchen ALPRAZolam (XANAX) 0.25 MG tablet 1 tab by mouth every morning as needed for anxiety and 2 tabs by mouth every evening as needed for insomnia (Patient not taking: Reported on 09/24/2017) 90 tablet 0  . ARMOUR THYROID 60 MG tablet   1  . fluconazole (DIFLUCAN) 150 MG tablet   0  . hydrOXYzine (ATARAX/VISTARIL) 50 MG tablet Take 1 tablet (50 mg total) by mouth every 6 (six) hours as needed. (Patient not taking: Reported on 09/24/2017) 30 tablet 0  . SUMAtriptan (IMITREX) 50 MG tablet Take 1 tablet (50 mg total) by mouth as needed. (Patient not taking: Reported on 09/24/2017) 10 tablet 6   No current facility-administered medications on file prior to visit.    No Known Allergies Family History  Problem Relation Age of Onset  . Arthritis Mother        RA and OA  . Cancer Maternal Aunt 48       breast CA  . Colon cancer Neg Hx   . Pancreatic cancer Neg Hx   . Stomach cancer Neg Hx    PE: BP 96/70   Pulse 77   Ht 5' 9.5" (1.765 m)   Wt 170 lb (77.1 kg)   LMP 09/18/2017   SpO2 98%   BMI 24.74 kg/m  Wt Readings from Last 3 Encounters:  09/24/17 170 lb (77.1 kg)  08/25/17 172 lb 3.2 oz (78.1 kg)  06/23/17 167 lb 6.4 oz (75.9 kg)   Constitutional: Normal weight, in NAD Eyes: PERRLA, EOMI, no exophthalmos ENT: moist mucous membranes, no thyromegaly, no cervical  lymphadenopathy Cardiovascular: RRR, No MRG Respiratory: CTA B Gastrointestinal: abdomen soft, NT, ND, BS+ Musculoskeletal: no deformities, strength intact in all 4 Skin: moist, warm, no rashes Neurological: no tremor with outstretched hands, DTR normal in all 4  ASSESSMENT: 1. Hypothyroidism 2/2 Hashimoto's thyroiditis  2. Abnormal cortisol levels  PLAN:  1. Patient with long-standing hypothyroidism 2/2 Hashimoto's thyroiditis, on Armour  therapy. - she appears euthyroid but has several complaints consistent with, both hypo-/hyperthyroidism.  She has anxiety, insomnia, hot flashes.  We discussed that this can all be related to menopause, but we also discussed that the T3 part of Armour could be a culprit.  I suggested to try levothyroxine 100 mcg daily which is the equivalent dose of what she is taking now.  She agrees to try this.  We will check thyroid labs in 5-6 weeks afterwards.  -  I suggested to stop her iodine supplements, which are useless while on full replacement with thyroid hormones - she does not appear to have a goiter, thyroid nodules, or neck compression symptoms; she has dysphagia with dry foods.  Occasionally. - We discussed about correct intake of thyroid medication, fasting, with water, separated by at least 30 minutes from breakfast, and separated by more than 4 hours from calcium, iron, multivitamins, acid reflux medications (PPIs). - we had a long discussion about her Hashimoto thyroiditis diagnosis. I explained that this is an autoimmune disorder, in which she develops antibodies against her own thyroid. The antibodies bind to the thyroid tissue and cause inflammation, and, eventually, destruction of the gland and hypothyroidism. We don't know how long this process can be, it can last from months to years.  - I also explained that thyroid enlargement especially at the beginning of her Hashimoto thyroiditis course is not uncommon, and it has a waxing and waning character.   - We discussed about treatment for Hashimoto thyroiditis, which is actually limited to thyroid hormones in case her TFTs are abnormal. Supplements like selenium has been tried with various results, some showing improvement in the TPO antibodies. However, there are no randomized controlled trials of this are consistent results between trials.  She agrees to try selenium. We also discussed about ways to improve her immune system (relaxation, diet, exercise, sleep) to reduce the Ab titer and, subsequently, the thyroid inflammation. - I will see her back in 4 months  2. Abnormal cortisol levels  - her cortisol levels Were very mildly abnormal at last check by her integrative medicine provider.  I explained that the level of the hormones are very fluctuating, and I suggested not to use the AdrenoMend supplement, to avoid any negative influence on the adrenal.  She has been off the medication for 2 weeks now.  However, due to her fatigue and several other symptoms that could be associated with possible adrenal insufficiency, I suggested to check a cosyntropin stimulation test.  She will return for this test at 8 AM, fasting  Patient Instructions  You can try Selenium 200 mcg daily.  Please change to levothyroxine 100 mcg daily.  Take the thyroid hormone every day, with water, at least 30 minutes before breakfast, separated by at least 4 hours from: - acid reflux medications - calcium - iron - multivitamins  Come back for thyroid labs in 5-6 weeks.   Come back for a cosyntropin stimulation test, fasting, at 8 am..  Stop AdrenoMend, iodine.   Please come back for a follow-up appointment in 4 months.   Philemon Kingdom, MD PhD [facility]

## 2017-09-24 NOTE — Addendum Note (Signed)
Addended by: Philemon Kingdom on: 09/24/2017 06:37 PM   Modules accepted: Orders

## 2017-09-24 NOTE — Patient Instructions (Addendum)
You can try Selenium 200 mcg daily.  Please change to levothyroxine 100 mcg daily.  Take the thyroid hormone every day, with water, at least 30 minutes before breakfast, separated by at least 4 hours from: - acid reflux medications - calcium - iron - multivitamins  Come back for thyroid labs in 5-6 weeks.   Come back for a cosyntropin stimulation test, fasting, at 8 am..  Stop AdrenoMend, iodine.   Please come back for a follow-up appointment in 4 months.

## 2017-09-26 ENCOUNTER — Encounter: Payer: Self-pay | Admitting: Family Medicine

## 2017-10-01 ENCOUNTER — Encounter: Payer: Self-pay | Admitting: Family Medicine

## 2017-10-01 ENCOUNTER — Ambulatory Visit: Payer: BLUE CROSS/BLUE SHIELD | Admitting: Family Medicine

## 2017-10-01 ENCOUNTER — Other Ambulatory Visit: Payer: BLUE CROSS/BLUE SHIELD

## 2017-10-01 DIAGNOSIS — R5383 Other fatigue: Secondary | ICD-10-CM | POA: Insufficient documentation

## 2017-10-01 NOTE — Progress Notes (Signed)
Subjective:   Patient ID: [name], female    DOB: June 20, 1961, 57 y.o.   MRN: [id number]  [name] is a pleasant 57 y.o. year old female who presents to clinic today with Follow-up (Patient is here today to F/U with Endo and Thyroid issues.  Endo appt-she was told to try the generic of Synthroid and she is not real comfortable with it.  She did not pick up the Levothyroxine.  She is wondering about an additional 15mg  Armour Thyroid so that she can add it qod or qd to see how her body responds. )  on 10/01/2017  HPI:  Endocrinology follow up-  Saw Dr. Cruzita Lederer on 09/24/17- note reviewed.  She felt she should stop taking Andrenomend and iodine and start taking synthroid 100 mcg daily rather than armour as the T3 component of armour could be exacerbating her symptoms of anxiety, insomnia and hot flashes.  [name] is nervous about making the switch to synthroid- mainly about it possibly triggering her migraines.  She would like my advice about this.  Current Outpatient Medications on File Prior to Visit  Medication Sig Dispense Refill  . ALPRAZolam (XANAX) 0.25 MG tablet 1 tab by mouth every morning as needed for anxiety and 2 tabs by mouth every evening as needed for insomnia 90 tablet 0  . ARMOUR THYROID 60 MG tablet   1  . baclofen (LIORESAL) 10 MG tablet as needed.     . cetirizine (ZYRTEC) 10 MG tablet Take 10 mg by mouth at bedtime.    . Cholecalciferol (VITAMIN D PO) Take by mouth daily.    . fexofenadine (ALLEGRA) 60 MG tablet Take 60 mg by mouth daily.    . fluticasone (FLONASE) 50 MCG/ACT nasal spray Place 2 sprays into both nostrils daily. 16 g 11  . GLUCOSAMINE-CHONDROITIN PO Take by mouth daily.    Marland Kitchen GLUTATHIONE PO Take 2 tablets by mouth 2 (two) times daily.    . hydrOXYzine (ATARAX/VISTARIL) 50 MG tablet Take 1 tablet (50 mg total) by mouth every 6 (six) hours as needed. 30 tablet 0  . Magnesium Citrate 100 MG TABS Take 200 mg by mouth daily.    . Melatonin 10 MG  TABS Take 10 mg by mouth at bedtime.    . Multiple Vitamin (MULTIVITAMIN) capsule Take 1 capsule by mouth daily.      . Omega-3 Fatty Acids (FISH OIL PO) Take by mouth daily.    . SUMAtriptan (IMITREX) 50 MG tablet Take 1 tablet (50 mg total) by mouth as needed. 10 tablet 6  . TURMERIC CURCUMIN PO Take 1 tablet by mouth daily.    Marland Kitchen levothyroxine (SYNTHROID, LEVOTHROID) 100 MCG tablet Take 1 tablet (100 mcg total) by mouth daily before breakfast. (Patient not taking: Reported on 10/01/2017) 45 tablet 3   No current facility-administered medications on file prior to visit.     No Known Allergies  Past Medical History:  Diagnosis Date  . Allergic rhinitis   . Cervical dysplasia 1990   s/p conization  . Chronic sinusitis   . Depression   . Fibromyalgia   . Hip fracture, left ([place]) 2005   s/p ORIF  . Hypothyroidism   . Migraine     Past Surgical History:  Procedure Laterality Date  . BREAST ENHANCEMENT SURGERY  2007  . DILATION AND CURETTAGE OF UTERUS  1989  . ORIF HIP FRACTURE  2005   [place]  . TONSILLECTOMY AND ADENOIDECTOMY      Family  History  Problem Relation Age of Onset  . Arthritis Mother        RA and OA  . Cancer Maternal Aunt 48       breast CA  . Colon cancer Neg Hx   . Pancreatic cancer Neg Hx   . Stomach cancer Neg Hx     Social History   Socioeconomic History  . Marital status: Married    Spouse name: Not on file  . Number of children: Not on file  . Years of education: Not on file  . Highest education level: Not on file  Social Needs  . Financial resource strain: Not on file  . Food insecurity - worry: Not on file  . Food insecurity - inability: Not on file  . Transportation needs - medical: Not on file  . Transportation needs - non-medical: Not on file  Occupational History  . Not on file  Tobacco Use  . Smoking status: Never Smoker  . Smokeless tobacco: Never Used  Substance and Sexual Activity  . Alcohol use: Yes    Comment: rarely  .  Drug use: No  . Sexual activity: Not on file  Other Topics Concern  . 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   The PMH, PSH, Social History, Family History, Medications, and allergies have been reviewed in [facility], and have been updated if relevant.   Review of Systems  Constitutional: Positive for fatigue.  Psychiatric/Behavioral: Negative.   All other systems reviewed and are negative.      Objective:    BP 124/70 (BP Location: Left Arm, Patient Position: Sitting, Cuff Size: Normal)   Pulse 88   Temp 98.9 F (37.2 C) (Oral)   Ht 5' 9.5" (1.765 m)   Wt 170 lb 9.6 oz (77.4 kg)   LMP 09/18/2017   SpO2 98%   BMI 24.83 kg/m    Physical Exam   General:  Well-developed,well-nourished,in no acute distress; alert,appropriate and cooperative throughout examination Head:  normocephalic and atraumatic.   Eyes:  vision grossly intact, PERRL Ears:  R ear normal and L ear normal externally, TMs clear bilaterally Nose:  no external deformity.   Mouth:  good dentition.   Neck:  No deformities, masses, or tenderness noted.   Lungs:  Normal respiratory effort, chest expands symmetrically. Lungs are clear to auscultation, no crackles or wheezes. Heart:  Normal rate and regular rhythm. S1 and S2 normal without gallop, murmur, click, rub or other extra sounds. Msk:  No deformity or scoliosis noted of thoracic or lumbar spine.   Extremities:  No clubbing, cyanosis, edema, or deformity noted with normal full range of motion of all joints.   Neurologic:  alert & oriented X3 and gait normal.   Skin:  Intact without suspicious lesions or rashes Psych:  Cognition and judgment appear intact. Alert and cooperative with normal attention span and concentration. No apparent delusions, illusions, hallucinations       Assessment & Plan:   Other fatigue No Follow-up on file.

## 2017-10-01 NOTE — Assessment & Plan Note (Signed)
>  25 minutes spent in face to face time with patient, >50% spent in counselling or coordination of care Given that her labs are in normal range and she is fearful of synthroid triggering migraines, I advised that she keep taking her armour as directed and increase physical activity. The patient indicates understanding of these issues and agrees with the plan.

## 2017-10-29 ENCOUNTER — Other Ambulatory Visit: Payer: BLUE CROSS/BLUE SHIELD

## 2017-11-04 ENCOUNTER — Encounter: Payer: Self-pay | Admitting: Family Medicine

## 2017-11-04 ENCOUNTER — Ambulatory Visit: Payer: BLUE CROSS/BLUE SHIELD | Admitting: Family Medicine

## 2017-11-04 VITALS — BP 106/54 | HR 90 | Temp 98.4°F | Ht 69.5 in | Wt 174.6 lb

## 2017-11-04 DIAGNOSIS — J069 Acute upper respiratory infection, unspecified: Secondary | ICD-10-CM | POA: Diagnosis not present

## 2017-11-04 MED ORDER — AMOXICILLIN-POT CLAVULANATE 875-125 MG PO TABS: 1.0000 | ORAL_TABLET | Freq: Two times a day (BID) | ORAL | 0 refills | Status: AC

## 2017-11-04 MED ORDER — METHYLPREDNISOLONE ACETATE 40 MG/ML IJ SUSP
40.0000 mg | Freq: Once | INTRAMUSCULAR | Status: AC
  Administered 2017-11-04: 40 mg via INTRAMUSCULAR

## 2017-11-04 NOTE — Patient Instructions (Signed)
Great to see you. Please take augmentin as directed- 1 tablet twice daily for 10 days.  Happy birthday!

## 2017-11-04 NOTE — Addendum Note (Signed)
Addended by: Marrion Coy on: 11/04/2017 12:03 PM   Modules accepted: Orders

## 2017-11-04 NOTE — Progress Notes (Signed)
SUBJECTIVE:  [name] is a 57 y.o. female who complains of coryza, congestion, sore throat, productive cough and bilateral sinus pain for 10 days. She denies a history of anorexia and chest pain and denies a history of asthma. Patient denies smoke cigarettes.   Current Outpatient Medications on File Prior to Visit  Medication Sig Dispense Refill  . ALPRAZolam (XANAX) 0.25 MG tablet 1 tab by mouth every morning as needed for anxiety and 2 tabs by mouth every evening as needed for insomnia 90 tablet 0  . ARMOUR THYROID 60 MG tablet   1  . baclofen (LIORESAL) 10 MG tablet as needed.     . cetirizine (ZYRTEC) 10 MG tablet Take 10 mg by mouth at bedtime.    . Cholecalciferol (VITAMIN D PO) Take by mouth daily.    . fexofenadine (ALLEGRA) 60 MG tablet Take 60 mg by mouth daily.    . fluticasone (FLONASE) 50 MCG/ACT nasal spray Place 2 sprays into both nostrils daily. 16 g 11  . GLUCOSAMINE-CHONDROITIN PO Take by mouth daily.    Marland Kitchen GLUTATHIONE PO Take 2 tablets by mouth 2 (two) times daily.    . hydrOXYzine (ATARAX/VISTARIL) 50 MG tablet Take 1 tablet (50 mg total) by mouth every 6 (six) hours as needed. 30 tablet 0  . Magnesium Citrate 100 MG TABS Take 200 mg by mouth daily.    . Melatonin 10 MG TABS Take 10 mg by mouth at bedtime.    . Multiple Vitamin (MULTIVITAMIN) capsule Take 1 capsule by mouth daily.      . Omega-3 Fatty Acids (FISH OIL PO) Take by mouth daily.    . SUMAtriptan (IMITREX) 50 MG tablet Take 1 tablet (50 mg total) by mouth as needed. 10 tablet 6  . TURMERIC CURCUMIN PO Take 1 tablet by mouth daily.     No current facility-administered medications on file prior to visit.     No Known Allergies  Past Medical History:  Diagnosis Date  . Allergic rhinitis   . Cervical dysplasia 1990   s/p conization  . Chronic sinusitis   . Depression   . Fibromyalgia   . Hip fracture, left ([place]) 2005   s/p ORIF  . Hypothyroidism   . Migraine     Past Surgical History:   Procedure Laterality Date  . BREAST ENHANCEMENT SURGERY  2007  . DILATION AND CURETTAGE OF UTERUS  1989  . ORIF HIP FRACTURE  2005   D[place]orf  . TONSILLECTOMY AND ADENOIDECTOMY      Family History  Problem Relation Age of Onset  . Arthritis Mother        RA and OA  . Cancer Maternal Aunt 48       breast CA  . Colon cancer Neg Hx   . Pancreatic cancer Neg Hx   . Stomach cancer Neg Hx     Social History   Socioeconomic History  . Marital status: Married    Spouse name: Not on file  . Number of children: Not on file  . Years of education: Not on file  . Highest education level: Not on file  Social Needs  . Financial resource strain: Not on file  . Food insecurity - worry: Not on file  . Food insecurity - inability: Not on file  . Transportation needs - medical: Not on file  . Transportation needs - non-medical: Not on file  Occupational History  . Not on file  Tobacco Use  . Smoking status: Never  Smoker  . Smokeless tobacco: Never Used  Substance and Sexual Activity  . Alcohol use: Yes    Comment: rarely  . Drug use: No  . Sexual activity: Not on file  Other Topics Concern  . 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   The PMH, PSH, Social History, Family History, Medications, and allergies have been reviewed in [facility], and have been updated if relevant.  OBJECTIVE:  BP (!) 106/54 (BP Location: Left Arm, Patient Position: Sitting, Cuff Size: Normal)   Pulse 90   Temp 98.4 F (36.9 C) (Oral)   Ht 5' 9.5" (1.765 m)   Wt 174 lb 9.6 oz (79.2 kg)   LMP 10/29/2017   SpO2 99%   BMI 25.41 kg/m   She appears well, vital signs are as noted. Ears normal.  Throat and pharynx normal.  Neck supple. No adenopathy in the neck. Nose is congested. Sinuses tender. The chest is clear, without wheezes or rales.  ASSESSMENT:  sinusitis  PLAN: Given duration and progression of symptoms, will treat for bacterial  sinusitis.   Symptomatic therapy suggested: push fluids, rest and return office visit prn if symptoms persist or worsen. Call or return to clinic prn if these symptoms worsen or fail to improve as anticipated.

## 2017-11-20 ENCOUNTER — Encounter: Payer: Self-pay | Admitting: Family Medicine

## 2018-01-22 ENCOUNTER — Ambulatory Visit: Payer: BLUE CROSS/BLUE SHIELD | Admitting: Internal Medicine

## 2018-02-10 ENCOUNTER — Ambulatory Visit: Payer: Self-pay | Admitting: Family Medicine

## 2018-02-10 NOTE — Telephone Encounter (Signed)
Called in c/o feeling very tired for the last 2 weeks and her BP running lower than normal for her.  It's 94/64 just before calling in.   Her usual is 100-110/70's.  Pulse 72 on the BP machine.    She saw an endocrinologist due to thyroid problems but she did not care for the doctor's suggestion to try a different medication for her thyroid due to the side effect of headaches.   "I have trouble with migraines  So anything that has a side effect of headaches I avoid".    So no changes were made in my thyroid medication.   I went back to Dr. Deborra Medina instead.   She had low BP and felt bad during that episode  with her thyroid similar like now.    "I don't know if something is going on with my thyroid again or what but I am just so tired and my BP is low".   Could it be related?  See triage notes.  I made her an appt with Dr. Deborra Medina for 02/11/18 at 9:00.   I instructed her to go to the ED if she fainted or became dizzy/lightheaded where she had to hold onto things to ambulate.    Also to get up slowly from sitting and lying positions which she acknowledged she is already doing and does on a normal basis anyway.   She verbalized understanding of directions to go to the ED and was agreeable to this plan.  Reason for Disposition . Fall in systolic BP > 20 mmHg after standing up  Answer Assessment - Initial Assessment Questions 1. BLOOD PRESSURE: "What is the blood pressure?" "Did you take at least two measurements 5 minutes apart?"     94/64 this morning.    110/70 is my usual.    Last night it was 80/59.    I've been feeling poorly for last 2 weeks.    Last fall seen by Dr. Deborra Medina for feeling tired.   I have thyroid problems.  I saw an endocrinologist that I didn't really care for her suggestions.   No changes were made in my medications.   I have a problem with migraines.   I feel lightheaded.   After I eat I'm so sleepy I just want to sleep.   I'm having a hard time focusing and being alert.   Like a brain fog.     2. ONSET: "When did you take your blood pressure?"     This morning 3. HOW: "How did you obtain the blood pressure?" (e.g., visiting nurse, automatic home BP monitor)     BP cuff on upper arm  Automatic machine. 4. HISTORY: "Do you have a history of low blood pressure?" "What is your blood pressure normally?"     Yes.   See above. 5. MEDICATIONS: "Are you taking any medications for blood pressure?" If yes: "Have they been changed recently?"     No 6. PULSE RATE: "Do you know what your pulse rate is?"      72 is pulse on BP machine.    My normal is 60 approximately. 7. OTHER SYMPTOMS: "Have you been sick recently?" "Have you had a recent injury?"     I have trouble with sinus infections but don't have one now.     8. PREGNANCY: "Is there any chance you are pregnant?" "When was your last menstrual period?"     No!  Protocols used: LOW BLOOD PRESSURE-A-AH

## 2018-02-11 ENCOUNTER — Encounter: Payer: Self-pay | Admitting: Family Medicine

## 2018-02-11 ENCOUNTER — Ambulatory Visit: Payer: BLUE CROSS/BLUE SHIELD | Admitting: Family Medicine

## 2018-02-11 VITALS — BP 102/78 | HR 76 | Temp 98.1°F | Ht 69.5 in | Wt 170.0 lb

## 2018-02-11 DIAGNOSIS — R42 Dizziness and giddiness: Secondary | ICD-10-CM

## 2018-02-11 DIAGNOSIS — E063 Autoimmune thyroiditis: Secondary | ICD-10-CM | POA: Diagnosis not present

## 2018-02-11 DIAGNOSIS — R5383 Other fatigue: Secondary | ICD-10-CM | POA: Diagnosis not present

## 2018-02-11 DIAGNOSIS — I959 Hypotension, unspecified: Secondary | ICD-10-CM | POA: Diagnosis not present

## 2018-02-11 DIAGNOSIS — E038 Other specified hypothyroidism: Secondary | ICD-10-CM | POA: Diagnosis not present

## 2018-02-11 LAB — CBC
HEMATOCRIT: 39.9 % (ref 36.0–46.0)
HEMOGLOBIN: 13.6 g/dL (ref 12.0–15.0)
MCHC: 34 g/dL (ref 30.0–36.0)
MCV: 93.6 fl (ref 78.0–100.0)
PLATELETS: 242 10*3/uL (ref 150.0–400.0)
RBC: 4.26 Mil/uL (ref 3.87–5.11)
RDW: 14 % (ref 11.5–15.5)
WBC: 4.6 10*3/uL (ref 4.0–10.5)

## 2018-02-11 LAB — COMPREHENSIVE METABOLIC PANEL
ALBUMIN: 4.2 g/dL (ref 3.5–5.2)
ALK PHOS: 63 U/L (ref 39–117)
ALT: 11 U/L (ref 0–35)
AST: 16 U/L (ref 0–37)
BILIRUBIN TOTAL: 0.4 mg/dL (ref 0.2–1.2)
BUN: 22 mg/dL (ref 6–23)
CO2: 28 mEq/L (ref 19–32)
Calcium: 9.8 mg/dL (ref 8.4–10.5)
Chloride: 105 mEq/L (ref 96–112)
Creatinine, Ser: 0.95 mg/dL (ref 0.40–1.20)
GFR: 64.88 mL/min (ref >60.00)
Glucose, Bld: 63 mg/dL — ABNORMAL LOW (ref 70–99)
Potassium: 4.4 mEq/L (ref 3.5–5.1)
Sodium: 139 mEq/L (ref 135–145)
TOTAL PROTEIN: 7.1 g/dL (ref 6.0–8.3)

## 2018-02-11 LAB — URINALYSIS, MICROSCOPIC ONLY: RBC / HPF: NONE SEEN (ref 0–?)

## 2018-02-11 LAB — VITAMIN D 25 HYDROXY (VIT D DEFICIENCY, FRACTURES): VITD: 53.08 ng/mL (ref 30.00–100.00)

## 2018-02-11 LAB — VITAMIN B12: Vitamin B-12: 637 pg/mL (ref 211–911)

## 2018-02-11 LAB — T4, FREE: Free T4: 0.85 ng/dL (ref 0.60–1.60)

## 2018-02-11 LAB — TSH: TSH: 0.5 u[IU]/mL (ref 0.35–4.50)

## 2018-02-11 NOTE — Progress Notes (Signed)
Subjective:   Patient ID: [name], female    DOB: Nov 12, 1960, 57 y.o.   MRN: [id number]  [name] is a pleasant 57 y.o. year old female who presents to clinic today with Fatigue (weak,fatigue,light headed,dizzy at time,cant focus at times/sinus? low BP 80/59--2 night ago/going on for 2 wk/)  on 02/11/2018  HPI:  Past two weeks- feels very tired.  Dizzy once when she stood up- checked her BP at and it was 80/59.  "Usually runs low but not that low."    Wonders if her sinuses are about to get infected or if her thyroid is off again.  BP Readings from Last 3 Encounters:  02/11/18 102/78  11/04/17 (!) 106/54  10/01/17 124/70    Lab Results  Component Value Date   TSH 0.50 02/11/2018      Current Outpatient Medications on File Prior to Visit  Medication Sig Dispense Refill  . ALPRAZolam (XANAX) 0.25 MG tablet 1 tab by mouth every morning as needed for anxiety and 2 tabs by mouth every evening as needed for insomnia 90 tablet 0  . ARMOUR THYROID 60 MG tablet   1  . baclofen (LIORESAL) 10 MG tablet as needed.     . cetirizine (ZYRTEC) 10 MG tablet Take 10 mg by mouth at bedtime.    . Cholecalciferol (VITAMIN D PO) Take by mouth daily.    . fexofenadine (ALLEGRA) 60 MG tablet Take 60 mg by mouth daily.    . fluticasone (FLONASE) 50 MCG/ACT nasal spray Place 2 sprays into both nostrils daily. 16 g 11  . GLUCOSAMINE-CHONDROITIN PO Take by mouth daily.    . hydrOXYzine (ATARAX/VISTARIL) 50 MG tablet Take 1 tablet (50 mg total) by mouth every 6 (six) hours as needed. 30 tablet 0  . Magnesium Citrate 100 MG TABS Take 200 mg by mouth daily.    . Melatonin 10 MG TABS Take 10 mg by mouth at bedtime.    . Multiple Vitamin (MULTIVITAMIN) capsule Take 1 capsule by mouth daily.      . Omega-3 Fatty Acids (FISH OIL PO) Take by mouth daily.    Marland Kitchen OVER THE COUNTER MEDICATION CBD Oil    . OVER THE COUNTER MEDICATION Seleium 200 mg    . SUMAtriptan (IMITREX) 50 MG tablet Take 1 tablet  (50 mg total) by mouth as needed. 10 tablet 6  . TURMERIC CURCUMIN PO Take 1 tablet by mouth daily.    . vitamin E 400 UNIT capsule Take 400 Units by mouth daily.    Marland Kitchen GLUTATHIONE PO Take 2 tablets by mouth 2 (two) times daily.     No current facility-administered medications on file prior to visit.     No Known Allergies  Past Medical History:  Diagnosis Date  . Allergic rhinitis   . Cervical dysplasia 1990   s/p conization  . Chronic sinusitis   . Depression   . Fibromyalgia   . Hip fracture, left ([place]) 2005   s/p ORIF  . Hypothyroidism   . Migraine     Past Surgical History:  Procedure Laterality Date  . BREAST ENHANCEMENT SURGERY  2007  . DILATION AND CURETTAGE OF UTERUS  1989  . ORIF HIP FRACTURE  2005   Daldorf  . TONSILLECTOMY AND ADENOIDECTOMY      Family History  Problem Relation Age of Onset  . Arthritis Mother        RA and OA  . Cancer Maternal Aunt 48  breast CA  . Colon cancer Neg Hx   . Pancreatic cancer Neg Hx   . Stomach cancer Neg Hx     Social History   Socioeconomic History  . Marital status: Married    Spouse name: Not on file  . Number of children: Not on file  . Years of education: Not on file  . Highest education level: Not on file  Occupational History  . Not on file  Social Needs  . Financial resource strain: Not on file  . Food insecurity:    Worry: Not on file    Inability: Not on file  . Transportation needs:    Medical: Not on file    Non-medical: Not on file  Tobacco Use  . Smoking status: Never Smoker  . Smokeless tobacco: Never Used  Substance and Sexual Activity  . Alcohol use: Yes    Comment: rarely  . Drug use: No  . Sexual activity: Not on file  Lifestyle  . Physical activity:    Days per week: Not on file    Minutes per session: Not on file  . Stress: Not on file  Relationships  . Social connections:    Talks on phone: Not on file    Gets together: Not on file    Attends religious service: Not on  file    Active member of club or organization: Not on file    Attends meetings of clubs or organizations: Not on file    Relationship status: Not on file  . Intimate partner violence:    Fear of current or ex partner: Not on file    Emotionally abused: Not on file    Physically abused: Not on file    Forced sexual activity: Not on file  Other Topics Concern  . 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  w The PMH, [place], Social History, Family History, Medications, and allergies have been reviewed in [facility], and have been updated if relevant.   Review of Systems  Constitutional: Positive for fatigue.  HENT: Positive for postnasal drip and rhinorrhea. Negative for sinus pressure, tinnitus and trouble swallowing.   Eyes: Negative.   Respiratory: Negative.   Cardiovascular: Negative.   Gastrointestinal: Negative.   Endocrine: Negative.   Skin: Negative.   Neurological: Positive for weakness. Negative for dizziness, tremors, seizures, syncope, facial asymmetry, speech difficulty, light-headedness, numbness and headaches.  Hematological: Negative.   All other systems reviewed and are negative.      Objective:    BP 102/78   Pulse 76   Temp 98.1 F (36.7 C) (Oral)   Ht 5' 9.5" (1.765 m)   Wt 170 lb (77.1 kg)   SpO2 99%   BMI 24.74 kg/m   BP Readings from Last 3 Encounters:  02/11/18 102/78  11/04/17 (!) 106/54  10/01/17 124/70     Physical Exam   General:  Well-developed,well-nourished,in no acute distress; alert,appropriate and cooperative throughout examination Head:  normocephalic and atraumatic.   Eyes:  vision grossly intact, PERRL Ears:  R ear normal and L ear normal externally, TMs clear bilaterally Nose:  no external deformity.   Mouth:  good dentition.   Neck:  No deformities, masses, or tenderness noted. Lungs:  Normal respiratory effort, chest expands symmetrically. Lungs are clear to auscultation, no  crackles or wheezes. Heart:  Normal rate and regular rhythm. S1 and S2 normal without gallop, murmur, click, rub or other extra  sounds. Msk:  No deformity or scoliosis noted of thoracic or lumbar spine.   Extremities:  No clubbing, cyanosis, edema, or deformity noted with normal full range of motion of all joints.   Neurologic:  alert & oriented X3 and gait normal.   Skin:  Intact without suspicious lesions or rashes Cervical Nodes:  No lymphadenopathy noted Psych:  Cognition and judgment appear intact. Alert and cooperative with normal attention span and concentration. No apparent delusions, illusions, hallucinations        Assessment & Plan:   Hypotension, unspecified hypotension type - Plan: Comprehensive metabolic panel, Urine Microscopic Only  Hypothyroidism due to Hashimoto's thyroiditis - Plan: TSH, T4, free  Dizziness - Plan: CBC, Vitamin D (25 hydroxy), B12 No follow-ups on file.

## 2018-02-11 NOTE — Telephone Encounter (Signed)
Patient seen 02/11/18.

## 2018-02-11 NOTE — Assessment & Plan Note (Signed)
Likely multifactorial.  Will check labs today as part of initial work up. The patient indicates understanding of these issues and agrees with the plan.

## 2018-02-11 NOTE — Assessment & Plan Note (Signed)
BP today is around what she usually runs and she is not dizzy. Reassurance provided- she will continue to keep an eye on her blood pressure.

## 2018-02-11 NOTE — Patient Instructions (Signed)
Great to see you. I will call you with your lab results from today and you can view them online.   

## 2018-02-12 ENCOUNTER — Encounter: Payer: Self-pay | Admitting: Family Medicine

## 2018-02-25 ENCOUNTER — Encounter: Payer: Self-pay | Admitting: Family Medicine

## 2018-03-03 ENCOUNTER — Other Ambulatory Visit (HOSPITAL_COMMUNITY)
Admission: RE | Admit: 2018-03-03 | Discharge: 2018-03-03 | Disposition: A | Discharge status: Home / Self Care | Payer: BLUE CROSS/BLUE SHIELD | Source: Ambulatory Visit | Attending: Family Medicine | Admitting: Family Medicine

## 2018-03-03 ENCOUNTER — Ambulatory Visit: Payer: BLUE CROSS/BLUE SHIELD | Admitting: Family Medicine

## 2018-03-03 ENCOUNTER — Encounter: Payer: Self-pay | Admitting: Family Medicine

## 2018-03-03 VITALS — BP 110/68 | HR 71 | Temp 98.9°F | Ht 69.5 in | Wt 169.2 lb

## 2018-03-03 DIAGNOSIS — N760 Acute vaginitis: Secondary | ICD-10-CM | POA: Insufficient documentation

## 2018-03-03 DIAGNOSIS — F411 Generalized anxiety disorder: Secondary | ICD-10-CM

## 2018-03-03 DIAGNOSIS — Z01812 Encounter for preprocedural laboratory examination: Secondary | ICD-10-CM | POA: Diagnosis not present

## 2018-03-03 DIAGNOSIS — Z7189 Other specified counseling: Secondary | ICD-10-CM | POA: Diagnosis not present

## 2018-03-03 DIAGNOSIS — Z01810 Encounter for preprocedural cardiovascular examination: Secondary | ICD-10-CM

## 2018-03-03 LAB — PROTIME-INR
INR: 1.1 ratio — AB (ref 0.8–1.0)
PROTHROMBIN TIME: 12.5 s (ref 9.6–13.1)

## 2018-03-03 LAB — APTT: aPTT: 30.2 s (ref 23.4–32.7)

## 2018-03-03 MED ORDER — FLUCONAZOLE 150 MG PO TABS: ORAL_TABLET | ORAL | 0 refills | Status: DC

## 2018-03-03 MED ORDER — ALPRAZOLAM 0.25 MG PO TABS: ORAL_TABLET | ORAL | 0 refills | Status: DC

## 2018-03-03 MED ORDER — ARMOUR THYROID 60 MG PO TABS: 60.0000 mg | ORAL_TABLET | Freq: Every day | ORAL | 3 refills | Status: DC

## 2018-03-03 NOTE — Assessment & Plan Note (Signed)
Rarely uses xanax. Refill rx today. She is aware of sedation and addiction potential.

## 2018-03-03 NOTE — Progress Notes (Signed)
Subjective:   Patient ID: [name], female    DOB: [birth year], 57 y.o.   MRN: [id number]  [name] is a pleasant 57 y.o. year old female who presents to clinic today with Vaginal Discharge (Patient is here today C/O vaginal D/C.  She feels that it may be a yeast infection.  Saturday started itching and burning and yesterday was having D/C and was miserable.  She took an apple cider vinegar bath which helped some.); Nail Problem (Patient is also wanting you to look at her toe nails.  She thinks that there is fungus under it and the toe nails have some white on them.); and Pre-surgical EKG (Patient is having surgery in September for her breast implants to be removed.  She is needing an EKG, PT/INR, Partial Thromboplasting Time, Activated with Dx D22.025 for the labs.)  on 03/03/2018  HPI:  Patient is here today C/O vaginal D/C. She feels that it may be a yeast infection. Saturday started itching and burning and yesterday was having D/C and was miserable. She took an apple cider vinegar bath which helped some.  Still very itchy today.   Patient is having surgery in September for her breast implants to be removed. She is needing an EKG, PT/INR, Partial Thromboplasting Time, Activated with Dx K27.062 for the labs.  Has never had any issues with previous surgeries.  She rarely uses xanax but she is asking for this to be refilled today.  A lot of stressors at home- her mom's health is not good, buying and selling a house.  Last refilled last September and she is using this sparingly.  Current Outpatient Medications on File Prior to Visit  Medication Sig Dispense Refill  . ALPRAZolam (XANAX) 0.25 MG tablet 1 tab by mouth every morning as needed for anxiety and 2 tabs by mouth every evening as needed for insomnia 90 tablet 0  . ARMOUR THYROID 60 MG tablet   1  . baclofen (LIORESAL) 10 MG tablet as needed.     . cetirizine (ZYRTEC) 10 MG tablet Take 10 mg by mouth at bedtime.    .  Cholecalciferol (VITAMIN D PO) Take by mouth daily.    . fexofenadine (ALLEGRA) 60 MG tablet Take 60 mg by mouth daily.    . fluticasone (FLONASE) 50 MCG/ACT nasal spray Place 2 sprays into both nostrils daily. 16 g 11  . GLUTATHIONE PO Take 2 tablets by mouth 2 (two) times daily.    . hydrOXYzine (ATARAX/VISTARIL) 50 MG tablet Take 1 tablet (50 mg total) by mouth every 6 (six) hours as needed. 30 tablet 0  . Magnesium Citrate 100 MG TABS Take 200 mg by mouth daily.    . Melatonin 10 MG TABS Take 10 mg by mouth at bedtime.    . Multiple Vitamin (MULTIVITAMIN) capsule Take 1 capsule by mouth daily.      . Omega-3 Fatty Acids (FISH OIL PO) Take by mouth daily.    Marland Kitchen OVER THE COUNTER MEDICATION CBD Oil    . OVER THE COUNTER MEDICATION Seleium 200 mg    . SUMAtriptan (IMITREX) 50 MG tablet Take 1 tablet (50 mg total) by mouth as needed. 10 tablet 6  . TURMERIC CURCUMIN PO Take 1 tablet by mouth daily.    . vitamin E 400 UNIT capsule Take 400 Units by mouth daily.     No current facility-administered medications on file prior to visit.     No Known Allergies  Past Medical History:  Diagnosis Date  . Allergic rhinitis   . Cervical dysplasia 1990   s/p conization  . Chronic sinusitis   . Depression   . Fibromyalgia   . Hip fracture, left ([place]) 2005   s/p ORIF  . Hypothyroidism   . Migraine     Past Surgical History:  Procedure Laterality Date  . BREAST ENHANCEMENT SURGERY  2007  . DILATION AND CURETTAGE OF UTERUS  1989  . ORIF HIP FRACTURE  2005   [facility]  . TONSILLECTOMY AND ADENOIDECTOMY      Family History  Problem Relation Age of Onset  . Arthritis Mother        RA and OA  . Cancer Maternal Aunt 48       breast CA  . Colon cancer Neg Hx   . Pancreatic cancer Neg Hx   . Stomach cancer Neg Hx     Social History   Socioeconomic History  . Marital status: Married    Spouse name: Not on file  . Number of children: Not on file  . Years of education: Not on file  .  Highest education level: Not on file  Occupational History  . Not on file  Social Needs  . Financial resource strain: Not on file  . Food insecurity:    Worry: Not on file    Inability: Not on file  . Transportation needs:    Medical: Not on file    Non-medical: Not on file  Tobacco Use  . Smoking status: Never Smoker  . Smokeless tobacco: Never Used  Substance and Sexual Activity  . Alcohol use: Yes    Comment: rarely  . Drug use: No  . Sexual activity: Not on file  Lifestyle  . Physical activity:    Days per week: Not on file    Minutes per session: Not on file  . Stress: Not on file  Relationships  . Social connections:    Talks on phone: Not on file    Gets together: Not on file    Attends religious service: Not on file    Active member of club or organization: Not on file    Attends meetings of clubs or organizations: Not on file    Relationship status: Not on file  . Intimate partner violence:    Fear of current or ex partner: Not on file    Emotionally abused: Not on file    Physically abused: Not on file    Forced sexual activity: Not on file  Other Topics Concern  . 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   The PMH, PSH, Social History, Family History, Medications, and allergies have been reviewed in [facility], and have been updated if relevant.   Review of Systems  Constitutional: Negative.   HENT: Negative.   Eyes: Negative.   Respiratory: Negative.   Cardiovascular: Negative.   Gastrointestinal: Negative.   Genitourinary: Positive for vaginal discharge. Negative for decreased urine volume, difficulty urinating, dyspareunia, dysuria, enuresis, flank pain, frequency, genital sores, hematuria, menstrual problem, pelvic pain, urgency, vaginal bleeding and vaginal pain.  Allergic/Immunologic: Negative.   Neurological: Negative.   Hematological: Negative.   Psychiatric/Behavioral: Negative for dysphoric  mood. The patient is nervous/anxious.   All other systems reviewed and are negative.      Objective:    BP 110/68 (BP Location: Left Arm, Patient Position: Sitting, Cuff Size: Normal)   Pulse 71   Temp  98.9 F (37.2 C) (Oral)   Ht 5' 9.5" (1.765 m)   Wt 169 lb 3.2 oz (76.7 kg)   LMP 01/23/2018 (Exact Date)   SpO2 100%   BMI 24.63 kg/m    Physical Exam  Constitutional: She is oriented to person, place, and time. She appears well-developed and well-nourished. No distress.  HENT:  Head: Normocephalic and atraumatic.  Eyes: EOM are normal.  Neck: Normal range of motion.  Cardiovascular: Normal rate and regular rhythm.  Pulmonary/Chest: Effort normal and breath sounds normal.  Genitourinary: Rectum normal. Cervix exhibits discharge. Vaginal discharge found.  Neurological: She is alert and oriented to person, place, and time. No cranial nerve deficit.  Skin: Skin is warm and dry. She is not diaphoretic.  Psychiatric: She has a normal mood and affect. Her behavior is normal. Judgment and thought content normal.  Nursing note and vitals reviewed.         Assessment & Plan:   Surgical counseling visit - Plan: EKG 12-Lead  Acute vaginitis  Pre-operative laboratory examination - Plan: INR/PT, PTT  Pre-operative cardiovascular examination No follow-ups on file.

## 2018-03-03 NOTE — Patient Instructions (Signed)
Great to see you. I will call you with your lab results from today and you can view them online.   

## 2018-03-03 NOTE — Assessment & Plan Note (Signed)
EKG unchanged from two prior- 2015 and 2016. Will check labs per surgeon's request a well today. She is cleared from cardiovascular standpoint.

## 2018-03-03 NOTE — Assessment & Plan Note (Signed)
Consistent with symptoms and signs of vaginal yeast infection- will send wet prep today for confirmation and to rule out BV. Treat with diflucan 150 mg po x 1, okay to repeat in 3 days if she remains symptomatic. Call or return to clinic prn if these symptoms worsen or fail to improve as anticipated. The patient indicates understanding of these issues and agrees with the plan.

## 2018-03-04 ENCOUNTER — Telehealth: Payer: Self-pay | Admitting: Family Medicine

## 2018-03-04 ENCOUNTER — Telehealth: Payer: Self-pay

## 2018-03-04 LAB — CERVICOVAGINAL ANCILLARY ONLY
BACTERIAL VAGINITIS: POSITIVE — AB
CANDIDA VAGINITIS: POSITIVE — AB

## 2018-03-04 NOTE — Telephone Encounter (Signed)
TA-They are requesting a letter for medical clearance/do I just make a letter stating that pt is cleared for cosmetic surgery? Plz advise and I can create the letter and fax it/thx dmf

## 2018-03-04 NOTE — Telephone Encounter (Signed)
Yes okay to write note stating that or you can fax my note to them.

## 2018-03-04 NOTE — Telephone Encounter (Signed)
Copied from [place] [id number]. Topic: General - Other >> Mar 04, 2018  4:12 PM Ivar Drape wrote: Reason for CRM:   Patient is out of town and said to please send the prescription for the yeast infection to: [facility] on [address] In [place], [place]

## 2018-03-04 NOTE — Telephone Encounter (Signed)
Copied from [place] [id number]. Topic: Inquiry >> Mar 04, 2018  9:59 AM Mylinda Latina, NT wrote: Reason for CRM: [name] Martinique calling from Dr. Darreld Mclean states that records was faxed over for a medical clearance on the patient, But she didn't receive a medical clearance letter and the EKG needs to be signed by MD and faxed as well. FAX# [phone]

## 2018-03-05 ENCOUNTER — Other Ambulatory Visit: Payer: Self-pay

## 2018-03-05 ENCOUNTER — Encounter: Payer: Self-pay | Admitting: Family Medicine

## 2018-03-05 MED ORDER — METRONIDAZOLE 500 MG PO TABS: 500.0000 mg | ORAL_TABLET | Freq: Two times a day (BID) | ORAL | 0 refills | Status: AC

## 2018-03-05 NOTE — Telephone Encounter (Signed)
Patient called again reference to an medication to be sent in the [place]. Please see below message. Patient is wanting a call back when the medication is called in  . CB# [phone]

## 2018-03-06 NOTE — Telephone Encounter (Signed)
This was completed 7.11.19/thx dmf

## 2018-03-06 NOTE — Telephone Encounter (Signed)
Created letter/will fax with office note/sent MyChart message to patient advising that she can also access the letter and it will be faxed/thx dmf

## 2018-04-01 DIAGNOSIS — N760 Acute vaginitis: Secondary | ICD-10-CM | POA: Diagnosis not present

## 2018-04-01 DIAGNOSIS — B373 Candidiasis of vulva and vagina: Secondary | ICD-10-CM | POA: Diagnosis not present

## 2018-04-20 ENCOUNTER — Telehealth: Payer: Self-pay

## 2018-04-20 ENCOUNTER — Other Ambulatory Visit: Payer: Self-pay

## 2018-04-20 ENCOUNTER — Other Ambulatory Visit: Payer: Self-pay | Admitting: Family Medicine

## 2018-04-20 NOTE — Telephone Encounter (Signed)
I am so sorry for her loss.  Imodium as needed for diarrhea (OTC) and yes you can refill her xanax early in this situation.  That is very appropriate.

## 2018-04-20 NOTE — Telephone Encounter (Signed)
Gave pt a called back, pt states her mother died a few das ago suddenly and she has been feeling really down and has been having stomach issues she believes is related to stress and not eat, pt experiencing diarrhea and would like to know of any meds that will be able to help. She does not have previous stomach issues. Also is it ok to refill her Xanax prescription before she has to leave town today for arrangments with the death because she is about out. Please advise Dr. Deborra Medina.   Copied from [place] [id number]. Topic: General - Other >> Apr 20, 2018  9:49 AM Carolyn Stare wrote:  Pt ask for a call back concerning a issue that she is having

## 2018-04-20 NOTE — Telephone Encounter (Signed)
Advised pt Dr. Hulen Shouts orders and Sent prescription

## 2018-05-21 HISTORY — PX: BREAST IMPLANT REMOVAL: SUR1101

## 2018-05-28 ENCOUNTER — Other Ambulatory Visit: Payer: Self-pay | Admitting: Family Medicine

## 2018-06-01 ENCOUNTER — Other Ambulatory Visit: Payer: Self-pay | Admitting: Family Medicine

## 2018-06-01 NOTE — Telephone Encounter (Signed)
TA-LOV 7.9.19/No new CSC or UDS on file/PMP ok no red flags/Plz advise when should follow-up/thx dmf

## 2018-06-02 NOTE — Telephone Encounter (Signed)
This was sent in on 10.7.19/thx dmf

## 2018-07-13 DIAGNOSIS — N941 Unspecified dyspareunia: Secondary | ICD-10-CM | POA: Diagnosis not present

## 2018-07-20 DIAGNOSIS — D225 Melanocytic nevi of trunk: Secondary | ICD-10-CM | POA: Diagnosis not present

## 2018-07-20 DIAGNOSIS — L821 Other seborrheic keratosis: Secondary | ICD-10-CM | POA: Diagnosis not present

## 2018-07-20 DIAGNOSIS — D2271 Melanocytic nevi of right lower limb, including hip: Secondary | ICD-10-CM | POA: Diagnosis not present

## 2018-07-21 ENCOUNTER — Other Ambulatory Visit: Payer: Self-pay | Admitting: Family Medicine

## 2018-07-21 DIAGNOSIS — N941 Unspecified dyspareunia: Secondary | ICD-10-CM | POA: Diagnosis not present

## 2018-07-21 NOTE — Telephone Encounter (Signed)
Ok to refill 

## 2018-07-29 DIAGNOSIS — M1711 Unilateral primary osteoarthritis, right knee: Secondary | ICD-10-CM | POA: Diagnosis not present

## 2018-07-29 DIAGNOSIS — M1712 Unilateral primary osteoarthritis, left knee: Secondary | ICD-10-CM | POA: Diagnosis not present

## 2018-08-04 DIAGNOSIS — M1712 Unilateral primary osteoarthritis, left knee: Secondary | ICD-10-CM | POA: Diagnosis not present

## 2018-08-04 DIAGNOSIS — M1711 Unilateral primary osteoarthritis, right knee: Secondary | ICD-10-CM | POA: Diagnosis not present

## 2018-08-10 ENCOUNTER — Ambulatory Visit: Payer: BLUE CROSS/BLUE SHIELD | Admitting: Family Medicine

## 2018-08-10 ENCOUNTER — Encounter: Payer: Self-pay | Admitting: Family Medicine

## 2018-08-10 VITALS — BP 96/78 | HR 84 | Temp 98.6°F | Ht 70.0 in | Wt 150.0 lb

## 2018-08-10 DIAGNOSIS — J019 Acute sinusitis, unspecified: Secondary | ICD-10-CM

## 2018-08-10 MED ORDER — METHYLPREDNISOLONE ACETATE 40 MG/ML IJ SUSP
40.0000 mg | Freq: Once | INTRAMUSCULAR | Status: AC
  Administered 2018-08-10: 40 mg via INTRAMUSCULAR

## 2018-08-10 MED ORDER — AMOXICILLIN-POT CLAVULANATE 875-125 MG PO TABS: 1.0000 | ORAL_TABLET | Freq: Two times a day (BID) | ORAL | 0 refills | Status: AC

## 2018-08-10 NOTE — Patient Instructions (Signed)
Great to see you! Happy Holidays!  Continue Allegra/ Mucinex DM, nasal saline, flonase. Take Augmentin as directed- 1 tablet twice daily for 10 days.  Drink plenty of fluids and keep me updated.  Have a very happy holiday!

## 2018-08-10 NOTE — Progress Notes (Signed)
SUBJECTIVE:  [name] is a 57 y.o. female who complains of coryza, congestion, productive cough and itching in eyes for 22 days. She denies a history of vomiting and weakness and denies a history of asthma. Patient denies smoke cigarettes.  Has tried OTC nasal spray, saline, mucinex DM, allegra, hot showers, essential oils but symptoms persist.  Today her teeth even hurt.  Felt dizzy this morning as well.   Current Outpatient Medications on File Prior to Visit  Medication Sig Dispense Refill  . ALPRAZolam (XANAX) 0.25 MG tablet TAKE 1 TABLET BY MOUTH EVERY MORNING AS NEEDED FOR ANXIETY AND 2 TABLETS EVERY EVENING AS NEEDED FOR INSOMNIA 30 tablet 0  . ARMOUR THYROID 60 MG tablet Take 1 tablet (60 mg total) by mouth daily before breakfast. 90 tablet 3  . Cholecalciferol (VITAMIN D PO) Take by mouth daily.    . fexofenadine (ALLEGRA) 60 MG tablet Take 60 mg by mouth daily.    . fluticasone (FLONASE) 50 MCG/ACT nasal spray Place 2 sprays into both nostrils daily. 16 g 11  . Magnesium Citrate 100 MG TABS Take 200 mg by mouth daily.    . Melatonin 10 MG TABS Take 10 mg by mouth at bedtime.    . Multiple Vitamin (MULTIVITAMIN) capsule Take 1 capsule by mouth daily.      . Omega-3 Fatty Acids (FISH OIL PO) Take by mouth daily.    Marland Kitchen OVER THE COUNTER MEDICATION CBD Oil    . OVER THE COUNTER MEDICATION Seleium 200 mg    . TURMERIC CURCUMIN PO Take 1 tablet by mouth daily.    . vitamin E 400 UNIT capsule Take 400 Units by mouth daily.    . baclofen (LIORESAL) 10 MG tablet as needed.     . cetirizine (ZYRTEC) 10 MG tablet Take 10 mg by mouth at bedtime.    . hydrOXYzine (ATARAX/VISTARIL) 50 MG tablet Take 1 tablet (50 mg total) by mouth every 6 (six) hours as needed. (Patient not taking: Reported on 08/10/2018) 30 tablet 0  . SUMAtriptan (IMITREX) 50 MG tablet Take 1 tablet (50 mg total) by mouth as needed. (Patient not taking: Reported on 08/10/2018) 10 tablet 6   No current  facility-administered medications on file prior to visit.     No Known Allergies  Past Medical History:  Diagnosis Date  . Allergic rhinitis   . Cervical dysplasia 1990   s/p conization  . Chronic sinusitis   . Depression   . Fibromyalgia   . Hip fracture, left ([place]) 2005   s/p ORIF  . Hypothyroidism   . Migraine     Past Surgical History:  Procedure Laterality Date  . BREAST ENHANCEMENT SURGERY  2007  . BREAST IMPLANT REMOVAL Bilateral 05/21/2018  . DILATION AND CURETTAGE OF UTERUS  1989  . ORIF HIP FRACTURE  2005   [place]  . TONSILLECTOMY AND ADENOIDECTOMY      Family History  Problem Relation Age of Onset  . Arthritis Mother        RA and OA  . Cancer Maternal Aunt 48       breast CA  . Colon cancer Neg Hx   . Pancreatic cancer Neg Hx   . Stomach cancer Neg Hx     Social History   Socioeconomic History  . Marital status: Married    Spouse name: Not on file  . Number of children: Not on file  . Years of education: Not on file  . Highest education  level: Not on file  Occupational History  . Not on file  Social Needs  . Financial resource strain: Not on file  . Food insecurity:    Worry: Not on file    Inability: Not on file  . Transportation needs:    Medical: Not on file    Non-medical: Not on file  Tobacco Use  . Smoking status: Never Smoker  . Smokeless tobacco: Never Used  Substance and Sexual Activity  . Alcohol use: Yes    Comment: rarely  . Drug use: No  . Sexual activity: Not on file  Lifestyle  . Physical activity:    Days per week: Not on file    Minutes per session: Not on file  . Stress: Not on file  Relationships  . Social connections:    Talks on phone: Not on file    Gets together: Not on file    Attends religious service: Not on file    Active member of club or organization: Not on file    Attends meetings of clubs or organizations: Not on file    Relationship status: Not on file  . Intimate partner violence:    Fear of  current or ex partner: Not on file    Emotionally abused: Not on file    Physically abused: Not on file    Forced sexual activity: Not on file  Other Topics Concern  . 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   The PMH, PSH, Social History, Family History, Medications, and allergies have been reviewed in [facility], and have been updated if relevant.  OBJECTIVE: BP 96/78 (BP Location: Left Arm, Patient Position: Sitting, Cuff Size: Normal)   Pulse 84   Temp 98.6 F (37 C) (Oral)   Ht 5\' 10"  (1.778 m)   Wt 150 lb (68 kg)   LMP 07/30/2018 (Exact Date)   SpO2 97%   BMI 21.52 kg/m    She appears well, vital signs are as noted. Ears normal.  Throat and pharynx normal.  Neck supple. No adenopathy in the neck. Nose is congested. Sinuses  tender. The chest is clear, without wheezes or rales.  ASSESSMENT:  sinusitis  PLAN: Given duration and progression of symptoms, will treat for bacterial sinusitis with Augmentin. Symptomatic therapy suggested: push fluids, rest and return office visit prn if symptoms persist or worsen.Call or return to clinic prn if these symptoms worsen or fail to improve as anticipated.

## 2018-08-10 NOTE — Addendum Note (Signed)
Addended by: Marrion Coy on: 08/10/2018 02:56 PM   Modules accepted: Orders

## 2018-09-04 ENCOUNTER — Other Ambulatory Visit: Payer: Self-pay | Admitting: Family Medicine

## 2018-09-10 ENCOUNTER — Encounter: Payer: Self-pay | Admitting: Family Medicine

## 2018-09-10 DIAGNOSIS — Z01419 Encounter for gynecological examination (general) (routine) without abnormal findings: Secondary | ICD-10-CM | POA: Insufficient documentation

## 2018-09-10 NOTE — Progress Notes (Signed)
Subjective:   Patient ID: [name], female    DOB: 1961-04-29, 58 y.o.   MRN: [id number]  [name] is a pleasant 58 y.o. year old female who presents to clinic today with Annual Exam (Patient is here today for a CPE.  Last PAP was 12.31.18 WNL except the presence of BV & Candida which she was Tx for.  On 7.9.19 she was Dx with BV and Tx again. She went to GYN since that time due to the multiple issues and had PAP on 11.18.19. Mammogram order has expired and pt will need another one [facility] for [facility] and she will call to schedule and please having it to not expire for 1 year.  She is currently fasting.)  on 09/14/2018  HPI:  Last pap smear 08/25/17 (done by me) No h/o abnormal pap smears in the last 5 years. Mammogram 07/01/13  Colonoscopy 07/13/13  Health Maintenance  Topic Date Due  . INFLUENZA VACCINE  03/26/2018  . MAMMOGRAM  03/04/2019 (Originally 07/02/2015)  . PAP SMEAR-Modifier  08/25/2020  . COLONOSCOPY  07/14/2023  . TETANUS/TDAP  05/02/2026  . Hepatitis C Screening  Completed  . HIV Screening  Completed   Hypothyroidism due to Hashimoto's thyroiditis- she did see Dr. Cruzita Lederer a year ago (09/24/17)- note reviewed.  At that time, it was suggested to switch from armour to levothyroxine.  She is currently back on armour 60 mg daily.  Loose stools- She is currently having intermittent loose stools.  No blood or mucous in her stools. Also has lost 30 pounds since 12/2017.   10 pounds were intentional prior to getting her breast implants removed but the additional 20 pounds were unintentional.  She has been eating cleaner- no sugar. Immodium does help with the loose stools/diarrhea.  Has been under more stress.  Wt Readings from Last 3 Encounters:  09/14/18 145 lb 3.2 oz (65.9 kg)  08/10/18 150 lb (68 kg)  03/03/18 169 lb 3.2 oz (76.7 kg)     Lab Results  Component Value Date   TSH 0.50 02/11/2018    Allergic rhinitis- taking zyrtec, flonase and  allegra.  Health Maintenance  Topic Date Due  . INFLUENZA VACCINE  03/26/2018  . MAMMOGRAM  03/04/2019 (Originally 07/02/2015)  . PAP SMEAR-Modifier  08/25/2020  . COLONOSCOPY  07/14/2023  . TETANUS/TDAP  05/02/2026  . Hepatitis C Screening  Completed  . HIV Screening  Completed      Current Outpatient Medications on File Prior to Visit  Medication Sig Dispense Refill  . ALPRAZolam (XANAX) 0.25 MG tablet TAKE 1 TABLET BY MOUTH EVERY MORNING AS NEEDED FOR ANXIETY AND 2 TABLETS EVERY EVENING AS NEEDED FOR INSOMNIA 30 tablet 0  . ARMOUR THYROID 60 MG tablet Take 1 tablet (60 mg total) by mouth daily before breakfast. 90 tablet 3  . baclofen (LIORESAL) 10 MG tablet as needed.     . cetirizine (ZYRTEC) 10 MG tablet Take 10 mg by mouth at bedtime.    . Cholecalciferol (VITAMIN D PO) Take by mouth daily.    . fexofenadine (ALLEGRA) 60 MG tablet Take 60 mg by mouth daily.    . fluticasone (FLONASE) 50 MCG/ACT nasal spray Place 2 sprays into both nostrils daily. 16 g 11  . hydrOXYzine (ATARAX/VISTARIL) 50 MG tablet Take 1 tablet (50 mg total) by mouth every 6 (six) hours as needed. (Patient not taking: Reported on 08/10/2018) 30 tablet 0  . Magnesium Citrate 100 MG TABS Take 200 mg  by mouth daily.    . Melatonin 10 MG TABS Take 10 mg by mouth at bedtime.    . Multiple Vitamin (MULTIVITAMIN) capsule Take 1 capsule by mouth daily.      . Omega-3 Fatty Acids (FISH OIL PO) Take by mouth daily.    Marland Kitchen OVER THE COUNTER MEDICATION CBD Oil    . OVER THE COUNTER MEDICATION Seleium 200 mg    . SUMAtriptan (IMITREX) 50 MG tablet Take 1 tablet (50 mg total) by mouth as needed. (Patient not taking: Reported on 08/10/2018) 10 tablet 6  . TURMERIC CURCUMIN PO Take 1 tablet by mouth daily.    . vitamin E 400 UNIT capsule Take 400 Units by mouth daily.     No current facility-administered medications on file prior to visit.     No Known Allergies  Past Medical History:  Diagnosis Date  . Allergic  rhinitis   . Cervical dysplasia 1990   s/p conization  . Chronic sinusitis   . Depression   . Fibromyalgia   . Hip fracture, left ([place]) 2005   s/p ORIF  . Hypothyroidism   . Migraine     Past Surgical History:  Procedure Laterality Date  . BREAST ENHANCEMENT SURGERY  2007  . BREAST IMPLANT REMOVAL Bilateral 05/21/2018  . DILATION AND CURETTAGE OF UTERUS  1989  . ORIF HIP FRACTURE  2005   [place]  . TONSILLECTOMY AND ADENOIDECTOMY      Family History  Problem Relation Age of Onset  . Arthritis Mother        RA and OA  . Cancer Maternal Aunt 48       breast CA  . Colon cancer Neg Hx   . Pancreatic cancer Neg Hx   . Stomach cancer Neg Hx     Social History   Socioeconomic History  . Marital status: Married    Spouse name: Not on file  . Number of children: Not on file  . Years of education: Not on file  . Highest education level: Not on file  Occupational History  . Not on file  Social Needs  . Financial resource strain: Not on file  . Food insecurity:    Worry: Not on file    Inability: Not on file  . Transportation needs:    Medical: Not on file    Non-medical: Not on file  Tobacco Use  . Smoking status: Never Smoker  . Smokeless tobacco: Never Used  Substance and Sexual Activity  . Alcohol use: Yes    Comment: rarely  . Drug use: No  . Sexual activity: Not on file  Lifestyle  . Physical activity:    Days per week: Not on file    Minutes per session: Not on file  . Stress: Not on file  Relationships  . Social connections:    Talks on phone: Not on file    Gets together: Not on file    Attends religious service: Not on file    Active member of club or organization: Not on file    Attends meetings of clubs or organizations: Not on file    Relationship status: Not on file  . Intimate partner violence:    Fear of current or ex partner: Not on file    Emotionally abused: Not on file    Physically abused: Not on file    Forced sexual activity: Not  on file  Other Topics Concern  . 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   The PMH, PSH, Social History, Family History, Medications, and allergies have been reviewed in [facility], and have been updated if relevant.   Review of Systems  Constitutional: Positive for unexpected weight change.  HENT: Negative for congestion, dental problem, drooling, ear discharge, ear pain, facial swelling, postnasal drip, rhinorrhea, sinus pressure, sinus pain, sneezing, sore throat, tinnitus, trouble swallowing and voice change.   Respiratory: Negative.   Cardiovascular: Negative.   Gastrointestinal: Positive for diarrhea.  Endocrine: Negative.   Genitourinary: Negative.   Musculoskeletal: Negative.   Allergic/Immunologic: Negative.   Neurological: Negative.   Hematological: Negative.   Psychiatric/Behavioral: Negative.   All other systems reviewed and are negative.      Objective:    BP 100/64 (BP Location: Left Arm, Patient Position: Sitting, Cuff Size: Normal)   Pulse 61   Temp 97.9 F (36.6 C) (Oral)   Ht 5' 9.75" (1.772 m)   Wt 145 lb 3.2 oz (65.9 kg)   LMP 08/27/2018   SpO2 100%   BMI 20.98 kg/m    Physical Exam    General:  Well-developed,well-nourished,in no acute distress; alert,appropriate and cooperative throughout examination Head:  normocephalic and atraumatic.   Eyes:  vision grossly intact, PERRL Ears:  R ear normal and L ear normal externally, TMs clear bilaterally Nose:  no external deformity.   Mouth:  good dentition.   Neck:  No deformities, masses, or tenderness noted. Breasts:  No mass, nodules, thickening, tenderness, bulging, retraction, inflamation, nipple discharge or skin changes noted.   Lungs:  Normal respiratory effort, chest expands symmetrically. Lungs are clear to auscultation, no crackles or wheezes. Heart:  Normal rate and regular rhythm. S1 and S2 normal without gallop, murmur, click, rub or  other extra sounds. Abdomen:  Bowel sounds positive,abdomen soft and non-tender without masses, organomegaly or hernias noted. Msk:  No deformity or scoliosis noted of thoracic or lumbar spine.   Extremities:  No clubbing, cyanosis, edema, or deformity noted with normal full range of motion of all joints.   Neurologic:  alert & oriented X3 and gait normal.   Skin:  Intact without suspicious lesions or rashes Cervical Nodes:  No lymphadenopathy noted Axillary Nodes:  No palpable lymphadenopathy Psych:  Cognition and judgment appear intact. Alert and cooperative with normal attention span and concentration. No apparent delusions, illusions, hallucinations        Assessment & Plan:   Well woman exam without gynecological exam  Hypothyroidism due to Hashimoto's thyroiditis - Plan: TSH, T4, free, T3, CBC with Differential/Platelet, Comprehensive metabolic panel, Lipid panel  Other migraine with status migrainosus, not intractable  Insomnia, unspecified type  Vitamin B12 deficiency - Plan: B12  Vitamin D deficiency - Plan: Vitamin D (25 hydroxy)  Screening for breast cancer - Plan: MM Digital Screening No follow-ups on file.

## 2018-09-14 ENCOUNTER — Ambulatory Visit (INDEPENDENT_AMBULATORY_CARE_PROVIDER_SITE_OTHER): Payer: BLUE CROSS/BLUE SHIELD | Admitting: Family Medicine

## 2018-09-14 ENCOUNTER — Encounter: Payer: Self-pay | Admitting: Family Medicine

## 2018-09-14 VITALS — BP 100/64 | HR 61 | Temp 97.9°F | Ht 69.75 in | Wt 145.2 lb

## 2018-09-14 DIAGNOSIS — Z1239 Encounter for other screening for malignant neoplasm of breast: Secondary | ICD-10-CM

## 2018-09-14 DIAGNOSIS — E538 Deficiency of other specified B group vitamins: Secondary | ICD-10-CM | POA: Diagnosis not present

## 2018-09-14 DIAGNOSIS — E038 Other specified hypothyroidism: Secondary | ICD-10-CM

## 2018-09-14 DIAGNOSIS — E063 Autoimmune thyroiditis: Secondary | ICD-10-CM

## 2018-09-14 DIAGNOSIS — Z23 Encounter for immunization: Secondary | ICD-10-CM

## 2018-09-14 DIAGNOSIS — R195 Other fecal abnormalities: Secondary | ICD-10-CM | POA: Insufficient documentation

## 2018-09-14 DIAGNOSIS — R634 Abnormal weight loss: Secondary | ICD-10-CM | POA: Diagnosis not present

## 2018-09-14 DIAGNOSIS — Z0000 Encounter for general adult medical examination without abnormal findings: Secondary | ICD-10-CM | POA: Diagnosis not present

## 2018-09-14 DIAGNOSIS — E559 Vitamin D deficiency, unspecified: Secondary | ICD-10-CM | POA: Diagnosis not present

## 2018-09-14 DIAGNOSIS — G43801 Other migraine, not intractable, with status migrainosus: Secondary | ICD-10-CM

## 2018-09-14 DIAGNOSIS — G4700 Insomnia, unspecified: Secondary | ICD-10-CM

## 2018-09-14 LAB — CBC WITH DIFFERENTIAL/PLATELET
Basophils Absolute: 0 10*3/uL (ref 0.0–0.1)
Basophils Relative: 1 % (ref 0.0–3.0)
Eosinophils Absolute: 0.2 10*3/uL (ref 0.0–0.7)
Eosinophils Relative: 4.4 % (ref 0.0–5.0)
HCT: 40.8 % (ref 36.0–46.0)
Hemoglobin: 14 g/dL (ref 12.0–15.0)
Lymphocytes Relative: 38.4 % (ref 12.0–46.0)
Lymphs Abs: 1.8 10*3/uL (ref 0.7–4.0)
MCHC: 34.3 g/dL (ref 30.0–36.0)
MCV: 94.5 fl (ref 78.0–100.0)
Monocytes Absolute: 0.4 10*3/uL (ref 0.1–1.0)
Monocytes Relative: 8.7 % (ref 3.0–12.0)
NEUTROS ABS: 2.2 10*3/uL (ref 1.4–7.7)
Neutrophils Relative %: 47.5 % (ref 43.0–77.0)
PLATELETS: 206 10*3/uL (ref 150.0–400.0)
RBC: 4.31 Mil/uL (ref 3.87–5.11)
RDW: 13.5 % (ref 11.5–15.5)
WBC: 4.7 10*3/uL (ref 4.0–10.5)

## 2018-09-14 LAB — COMPREHENSIVE METABOLIC PANEL
ALT: 12 U/L (ref 0–35)
AST: 17 U/L (ref 0–37)
Albumin: 4.3 g/dL (ref 3.5–5.2)
Alkaline Phosphatase: 58 U/L (ref 39–117)
BUN: 21 mg/dL (ref 6–23)
CO2: 28 meq/L (ref 19–32)
Calcium: 9.6 mg/dL (ref 8.4–10.5)
Chloride: 105 mEq/L (ref 96–112)
Creatinine, Ser: 0.94 mg/dL (ref 0.40–1.20)
GFR: 61.66 mL/min (ref >60.00)
Glucose, Bld: 89 mg/dL (ref 70–99)
Potassium: 3.9 mEq/L (ref 3.5–5.1)
Sodium: 139 mEq/L (ref 135–145)
Total Bilirubin: 0.6 mg/dL (ref 0.2–1.2)
Total Protein: 6.8 g/dL (ref 6.0–8.3)

## 2018-09-14 LAB — LIPID PANEL
Cholesterol: 157 mg/dL (ref 0–200)
HDL: 71.1 mg/dL (ref >39.00)
LDL Cholesterol: 74 mg/dL (ref 0–99)
NonHDL: 86.3
Total CHOL/HDL Ratio: 2
Triglycerides: 60 mg/dL (ref 0.0–149.0)
VLDL: 12 mg/dL (ref 0.0–40.0)

## 2018-09-14 LAB — VITAMIN D 25 HYDROXY (VIT D DEFICIENCY, FRACTURES): VITD: 63.81 ng/mL (ref 30.00–100.00)

## 2018-09-14 LAB — T4, FREE: Free T4: 0.76 ng/dL (ref 0.60–1.60)

## 2018-09-14 LAB — VITAMIN B12: Vitamin B-12: 329 pg/mL (ref 211–911)

## 2018-09-14 LAB — H. PYLORI ANTIBODY, IGG: H Pylori IgG: NEGATIVE

## 2018-09-14 LAB — TSH: TSH: 1.17 u[IU]/mL (ref 0.35–4.50)

## 2018-09-14 NOTE — Assessment & Plan Note (Signed)
Reviewed preventive care protocols, scheduled due services, and updated immunizations Discussed nutrition, exercise, diet, and healthy lifestyle.  Orders Placed This Encounter  Procedures  . MM Digital Screening  . Flu Vaccine QUAD 6+ mos PF IM (Fluarix Quad PF)  . TSH  . T4, free  . T3  . CBC with Differential/Platelet  . Comprehensive metabolic panel  . Lipid panel  . Vitamin D (25 hydroxy)  . B12

## 2018-09-14 NOTE — Assessment & Plan Note (Signed)
Continue current dose of Armour.  Check labs today.

## 2018-09-14 NOTE — Patient Instructions (Addendum)
Great to see you. I will call you with your lab results from today and you can view them online.   Please call the breast center at [phone] to schedule your mammogram.  We are also referring you back to see Dr. Hilarie Fredrickson.

## 2018-09-14 NOTE — Assessment & Plan Note (Signed)
Check Vit D today. 

## 2018-09-14 NOTE — Assessment & Plan Note (Signed)
Likely due to IBS/stressors/change in diet. With unintentional weight loss, will check labs today including thyroid function, h pylori. Refer to GI for further work up as well if labs are unremarkable.

## 2018-09-17 LAB — THYROID STIMULATING IMMUNOGLOBULIN: TSI: <89 % baseline (ref <140)

## 2018-09-17 LAB — T3: T3 TOTAL: 136 ng/dL (ref 76–181)

## 2018-09-18 ENCOUNTER — Encounter: Payer: Self-pay | Admitting: Family Medicine

## 2018-09-21 DIAGNOSIS — N939 Abnormal uterine and vaginal bleeding, unspecified: Secondary | ICD-10-CM | POA: Diagnosis not present

## 2018-09-22 ENCOUNTER — Encounter: Payer: Self-pay | Admitting: Family Medicine

## 2018-10-01 DIAGNOSIS — N858 Other specified noninflammatory disorders of uterus: Secondary | ICD-10-CM | POA: Diagnosis not present

## 2018-10-01 DIAGNOSIS — N841 Polyp of cervix uteri: Secondary | ICD-10-CM | POA: Diagnosis not present

## 2018-10-01 DIAGNOSIS — N926 Irregular menstruation, unspecified: Secondary | ICD-10-CM | POA: Diagnosis not present

## 2018-10-02 ENCOUNTER — Encounter: Payer: Self-pay | Admitting: Family Medicine

## 2018-10-12 ENCOUNTER — Ambulatory Visit: Payer: BLUE CROSS/BLUE SHIELD | Admitting: Family Medicine

## 2018-10-13 ENCOUNTER — Other Ambulatory Visit: Payer: Self-pay | Admitting: Family Medicine

## 2018-10-13 NOTE — Telephone Encounter (Signed)
TA-Plz see refill req/LOV 1.20.20/last filled 1.16.20/per [place] PMP pt is compliant no red flags/plz advise/thx dmf

## 2018-10-14 ENCOUNTER — Ambulatory Visit: Payer: BLUE CROSS/BLUE SHIELD

## 2018-10-20 ENCOUNTER — Ambulatory Visit: Payer: Self-pay

## 2018-10-20 NOTE — Telephone Encounter (Signed)
Pt was emptying her central vacuum cleaner tray and accidentally inhaled the debris from the vacuum cleaner. Pt stated that it happened this morning. Pt stated her sinuses are irritated, ears feel stopped up and having post nasal drip. Pt stated she used saline spray and then used a neti pot to clean her sinuses. Pt stated when she used the neti pot she suddenly began having severe pain to both ears, sinuses, her jaw and teeth. Pt stated that pain was so severe that she teared up.  Pt advised to call if symptoms worsen. Pt given appt with Dr Weston Settle tomorrow at 11:00 am. PCP with no openings.   Reason for Disposition . Nursing judgment or information in reference  Answer Assessment - Initial Assessment Questions 1. SYMPTOMS: "What symptoms are you having?" (e.g., none, cough, difficulty breathing, headache, dizziness, nausea)     Sinuses irritated, ears feel stopped up, post nasal drip 2. ONSET:  "When did the symptoms begin?"     Today after inhaling vacuum dust  3. SOURCE OF EXPOSURE: "What happened?" (e.g., home fire, second-hand smoke)     Inhaled dust carpet fiber and pet hair  4. CO EXPOSURE:  "Were you exposed to carbon monoxide?"    - KNOWN  - high CO measured in air; coworker or family member diagnosed with CO poisoning;     - SUSPECTED - CO alarm sounded; exposure to car exhaust fumes, burning charcoal, burning kerosene, or other gas burning device.     n/a 5. OTHERS: "Is anyone else having similar symptoms?"      no 6. TREATMENT: "What have you done so far  to treat this?" (e.g., open the windows, go outside)     neti pot, saline spray - painful to ear, sinuses, jaw and teeth 7. PREGNANCY: "Is there any chance you are pregnant?" "When was your last menstrual period?"    n/a  Answer Assessment - Initial Assessment Questions 1. REASON FOR CALL: "What is your main concern right now?"     Pt inhaled vacuum cleaner dirt and debris and did neti pot and gave her ear pain, sinus and  teeth pain. 2. ONSET: "When did the symptoms start?"    today 3. SEVERITY: "How bad is the pain?"    moderate 4. FEVER: "Do you have a fever?"     no 5. OTHER SYMPTOMS: "Do you have any other new symptoms?"     no 6. INTERVENTIONS AND RESPONSE: "What have you done so far to try to make this better? What medications have you used?"     Saline nasal spray and neti pot 7. PREGNANCY: "Is there any chance you are pregnant?"     n/a  Protocols used: NO GUIDELINE AVAILABLE-A-AH, SMOKE AND FUME INHALATION-A-AH

## 2018-10-21 ENCOUNTER — Encounter: Payer: Self-pay | Admitting: Family Medicine

## 2018-10-21 ENCOUNTER — Other Ambulatory Visit: Payer: Self-pay | Admitting: Family Medicine

## 2018-10-21 ENCOUNTER — Ambulatory Visit: Payer: BLUE CROSS/BLUE SHIELD | Admitting: Family Medicine

## 2018-10-21 DIAGNOSIS — Z7729 Contact with and (suspected ) exposure to other hazardous substances: Secondary | ICD-10-CM | POA: Diagnosis not present

## 2018-10-21 DIAGNOSIS — F411 Generalized anxiety disorder: Secondary | ICD-10-CM

## 2018-10-21 MED ORDER — ALPRAZOLAM 0.25 MG PO TABS: ORAL_TABLET | ORAL | 5 refills | Status: DC

## 2018-10-21 MED ORDER — MUPIROCIN 2 % EX OINT: 1.0000 "application " | TOPICAL_OINTMENT | Freq: Two times a day (BID) | CUTANEOUS | 0 refills | Status: AC

## 2018-10-21 NOTE — Patient Instructions (Signed)
Continue saline flushes/netti pot Apply mupirocin twice daily for the next week.  If you develop worsening symptoms including worsening sinus pain, fever, chills, thick, yellow mucus please call.

## 2018-10-21 NOTE — Assessment & Plan Note (Signed)
-  Requesting refill and increase in quantity of alprazolam per month. Also requesting additional hydroxyzine.   Discussed that since I have not been managing her anxiety I would defer to Dr. Deborra Medina and send her a note with the request.

## 2018-10-21 NOTE — Assessment & Plan Note (Signed)
Mild inflammation of nasal mucosa, rx for mupirocin to be applied bid x7 days.  Continue nettie pot/saline rinses.  Call for worsening symptoms.

## 2018-10-21 NOTE — Progress Notes (Signed)
[name] - 58 y.o. female MRN [id number]  Date of birth: Feb 13, 1961  Subjective Chief Complaint  Patient presents with  . Other    inhaled vaccum dust last night, sinuses irritated.     HPI [name] is a 57 y.o. female here today with complaint of dust inhalation.  She reports that she was cleaning out her vacuum and inhaled a cloud of dust from the vacuum.  Had nasal irritation initially and tried to use nasal saline and then netti pot.  Had pain after using netti pot that radiated through sinuses and into ears.  This has improved today but she did want to get checked out.  She denies any respiratory symptoms including shortness of breath, wheezing or cough.    She also reports increased stress as she is trying manage her mothers estate.  She requests a refill of vistaril and alprazolam and would like to increase this to twice per day due to ongoing stress.    ROS:  A comprehensive ROS was completed and negative except as noted per HPI  No Known Allergies  Past Medical History:  Diagnosis Date  . Allergic rhinitis   . Cervical dysplasia 1990   s/p conization  . Chronic sinusitis   . Depression   . Fibromyalgia   . Hip fracture, left ([place]) 2005   s/p ORIF  . Hypothyroidism   . Migraine     Past Surgical History:  Procedure Laterality Date  . BREAST ENHANCEMENT SURGERY  2007  . BREAST IMPLANT REMOVAL Bilateral 05/21/2018  . DILATION AND CURETTAGE OF UTERUS  1989  . ORIF HIP FRACTURE  2005   [place]  . TONSILLECTOMY AND ADENOIDECTOMY      Social History   Socioeconomic History  . Marital status: Married    Spouse name: Not on file  . Number of children: Not on file  . Years of education: Not on file  . Highest education level: Not on file  Occupational History  . Not on file  Social Needs  . Financial resource strain: Not on file  . Food insecurity:    Worry: Not on file    Inability: Not on file  . Transportation needs:    Medical: Not on file    Non-medical: Not on file  Tobacco Use  . Smoking status: Never Smoker  . Smokeless tobacco: Never Used  Substance and Sexual Activity  . Alcohol use: Yes    Comment: rarely  . Drug use: No  . Sexual activity: Not on file  Lifestyle  . Physical activity:    Days per week: Not on file    Minutes per session: Not on file  . Stress: Not on file  Relationships  . Social connections:    Talks on phone: Not on file    Gets together: Not on file    Attends religious service: Not on file    Active member of club or organization: Not on file    Attends meetings of clubs or organizations: Not on file    Relationship status: Not on file  Other Topics Concern  . 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    Family History  Problem Relation Age of Onset  . Arthritis Mother        RA and OA  . Cancer Maternal Aunt 48       breast CA  . Colon cancer Neg Hx   .  Pancreatic cancer Neg Hx   . Stomach cancer Neg Hx     Health Maintenance  Topic Date Due  . MAMMOGRAM  03/04/2019 (Originally 07/02/2015)  . PAP SMEAR-Modifier  08/25/2020  . COLONOSCOPY  07/14/2023  . TETANUS/TDAP  05/02/2026  . INFLUENZA VACCINE  Completed  . Hepatitis C Screening  Completed  . HIV Screening  Completed    ----------------------------------------------------------------------------------------------------------------------------------------------------------------------------------------------------------------- Physical Exam BP 110/68   Pulse 62   Temp 98.1 F (36.7 C) (Oral)   Ht 5' 9.75" (1.772 m)   Wt 148 lb (67.1 kg)   SpO2 97%   BMI 21.39 kg/m   Physical Exam Constitutional:      Appearance: Normal appearance.  HENT:     Head: Atraumatic.     Right Ear: Tympanic membrane normal.     Left Ear: Tympanic membrane normal.     Nose:     Comments: Turbinates and nasal mucosa erythematous.     Mouth/Throat:     Mouth: Mucous  membranes are moist.  Eyes:     General: No scleral icterus. Neck:     Musculoskeletal: Neck supple.  Cardiovascular:     Rate and Rhythm: Normal rate and regular rhythm.  Pulmonary:     Effort: Pulmonary effort is normal.     Breath sounds: Normal breath sounds.  Lymphadenopathy:     Cervical: No cervical adenopathy.  Skin:    General: Skin is warm and dry.     Findings: No rash.  Neurological:     General: No focal deficit present.     Mental Status: She is alert.  Psychiatric:        Mood and Affect: Mood normal.        Behavior: Behavior normal.     ------------------------------------------------------------------------------------------------------------------------------------------------------------------------------------------------------------------- Assessment and Plan  Dust exposure Mild inflammation of nasal mucosa, rx for mupirocin to be applied bid x7 days.  Continue nettie pot/saline rinses.  Call for worsening symptoms.    Generalized anxiety disorder -Requesting refill and increase in quantity of alprazolam per month. Also requesting additional hydroxyzine.   Discussed that since I have not been managing her anxiety I would defer to Dr. Deborra Medina and send her a note with the request.

## 2018-10-22 ENCOUNTER — Other Ambulatory Visit: Payer: Self-pay | Admitting: Family Medicine

## 2018-10-22 MED ORDER — HYDROXYZINE HCL 50 MG PO TABS: 50.0000 mg | ORAL_TABLET | Freq: Four times a day (QID) | ORAL | 0 refills | Status: DC | PRN

## 2018-10-29 ENCOUNTER — Encounter: Payer: Self-pay | Admitting: Family Medicine

## 2018-11-06 ENCOUNTER — Other Ambulatory Visit: Payer: Self-pay | Admitting: Family Medicine

## 2018-11-06 ENCOUNTER — Telehealth: Payer: Self-pay | Admitting: Family Medicine

## 2018-11-06 MED ORDER — AMOXICILLIN-POT CLAVULANATE 875-125 MG PO TABS: 1.0000 | ORAL_TABLET | Freq: Two times a day (BID) | ORAL | 0 refills | Status: DC

## 2018-11-06 NOTE — Telephone Encounter (Signed)
No allergies noted, augmentin sent in.  Needs f/u if not improving.

## 2018-11-06 NOTE — Telephone Encounter (Signed)
Pt stated she still has some: coughing thick mucus (coffee color), sinus pain,pressure in head,teeth pain,sore throat. Pt tried home remedies, decongestant med,mucinex,flonase and ibuprofen--didn't help. Pt mention she gets this yearly and Dr. Deborra Medina normally give her abx--normally help.

## 2018-11-06 NOTE — Telephone Encounter (Signed)
Pt is aware.  

## 2018-11-06 NOTE — Progress Notes (Signed)
a 

## 2018-11-06 NOTE — Telephone Encounter (Signed)
It has been 2.5 weeks since I last saw here.  What symptoms is she having currently?

## 2018-11-06 NOTE — Telephone Encounter (Signed)
Dr. Zigmund Daniel please advise, pt saw you on 10/21/2018.

## 2018-11-06 NOTE — Telephone Encounter (Signed)
Copied from [place]. Topic: Quick Communication - See Telephone Encounter >> Nov 06, 2018 10:41 AM Blase Mess A wrote: CRM for notification. See Telephone encounter for: 11/06/18.  Patient is calling because she was in the office previously she is still having symptoms. Was advised to call back for anitbotics if not better. Please advise. [phone] [facility] - [place], [place]. [phone] (Phone) [phone] (Fax)

## 2018-11-06 NOTE — Telephone Encounter (Signed)
Pt called back in to follow up on request for a antibiotic. Pt is panic because the weekend is approaching. Pt says that unfortunately she get sinus infections regularly.   Pt is hoping that she can receive something to help her today if possible.   Pt seen Dr. Zigmund Daniel.

## 2019-01-01 ENCOUNTER — Encounter: Payer: Self-pay | Admitting: Family Medicine

## 2019-01-27 ENCOUNTER — Other Ambulatory Visit: Payer: Self-pay

## 2019-01-27 ENCOUNTER — Other Ambulatory Visit: Payer: Self-pay | Admitting: Family Medicine

## 2019-01-27 ENCOUNTER — Ambulatory Visit
Admission: RE | Admit: 2019-01-27 | Discharge: 2019-01-27 | Disposition: A | Discharge status: Home / Self Care | Payer: BC Managed Care – PPO | Source: Ambulatory Visit | Attending: Family Medicine | Admitting: Family Medicine

## 2019-01-27 DIAGNOSIS — Z1231 Encounter for screening mammogram for malignant neoplasm of breast: Secondary | ICD-10-CM | POA: Diagnosis not present

## 2019-01-27 DIAGNOSIS — Z1239 Encounter for other screening for malignant neoplasm of breast: Secondary | ICD-10-CM

## 2019-02-19 ENCOUNTER — Other Ambulatory Visit: Payer: Self-pay | Admitting: Family Medicine

## 2019-03-08 ENCOUNTER — Encounter: Payer: Self-pay | Admitting: Family Medicine

## 2019-03-17 ENCOUNTER — Other Ambulatory Visit: Payer: Self-pay | Admitting: Family Medicine

## 2019-03-17 DIAGNOSIS — S61211A Laceration without foreign body of left index finger without damage to nail, initial encounter: Secondary | ICD-10-CM | POA: Diagnosis not present

## 2019-03-17 DIAGNOSIS — M79645 Pain in left finger(s): Secondary | ICD-10-CM | POA: Diagnosis not present

## 2019-06-01 ENCOUNTER — Ambulatory Visit (INDEPENDENT_AMBULATORY_CARE_PROVIDER_SITE_OTHER): Payer: BC Managed Care – PPO | Admitting: Behavioral Health

## 2019-06-01 ENCOUNTER — Other Ambulatory Visit: Payer: Self-pay

## 2019-06-01 ENCOUNTER — Encounter: Payer: Self-pay | Admitting: Family Medicine

## 2019-06-01 DIAGNOSIS — Z23 Encounter for immunization: Secondary | ICD-10-CM

## 2019-06-01 NOTE — Progress Notes (Signed)
Patient presents in clinic today for Influenza vaccination. IM injection was given in the right deltoid. Patient tolerated the injection well. No signs or symptoms of a reaction were noted prior to patient leaving the nurse visit.

## 2019-06-07 ENCOUNTER — Ambulatory Visit: Payer: Self-pay | Admitting: *Deleted

## 2019-06-07 NOTE — Telephone Encounter (Addendum)
Patient is calling to report she is having vertigo- patient is waiting for husband while he has procedure- it has finished and she request call back in 15 minutes.  Answer Assessment - Initial Assessment Questions 1. DESCRIPTION: "Describe your dizziness."     Relentless- constant state of motion sickness 2. VERTIGO: "Do you feel like either you or the room is spinning or tilting?"      No- more motion sickness 3. LIGHTHEADED: "Do you feel lightheaded?" (e.g., somewhat faint, woozy, weak upon standing)     Almost constant state with patinet 4. SEVERITY: "How bad is it?"  "Can you walk?"   - MILD - Feels unsteady but walking normally.   - MODERATE - Feels very unsteady when walking, but not falling; interferes with normal activities (e.g., school, work) .   - SEVERE - Unable to walk without falling (requires assistance).     Moderate- feels ok to drive- quizzy and car sick all the time 5. ONSET:  "When did the dizziness begin?"     1 week 6. AGGRAVATING FACTORS: "Does anything make it worse?" (e.g., standing, change in head position)     *No Answer* 7. CAUSE: "What do you think is causing the dizziness?"     Vertigo symptoms 8. RECURRENT SYMPTOM: "Have you had dizziness before?" If so, ask: "When was the last time?" "What happened that time?"     Patient has history of vertigo 9. OTHER SYMPTOMS: "Do you have any other symptoms?" (e.g., headache, weakness, numbness, vomiting, earache)     *No Answer* 10. PREGNANCY: "Is there any chance you are pregnant?" "When was your last menstrual period?"       *No Answer*  Protocols used: DIZZINESS - VERTIGO-A-AH Questions 6-10 answers: Patient states she moved in May and did notice an increase in symptoms when bending over and packing- but felt that was to be expected. Patient reports no other symptoms at this time. No for pregnacy question.

## 2019-06-07 NOTE — Telephone Encounter (Signed)
  Reason for Disposition . [1] MODERATE dizziness (e.g., vertigo; feels very unsteady, interferes with normal activities) AND [2] has been evaluated by physician for this  Protocols used: DIZZINESS - VERTIGO-A-AH

## 2019-06-07 NOTE — Telephone Encounter (Signed)
Called patient back- finished triage of symptoms- she wants to see PCP- appointment made for Thursday am- note sent for review and sooner appointment if available.

## 2019-06-10 ENCOUNTER — Ambulatory Visit: Payer: BC Managed Care – PPO | Admitting: Family Medicine

## 2019-06-11 ENCOUNTER — Other Ambulatory Visit: Payer: Self-pay

## 2019-06-14 ENCOUNTER — Ambulatory Visit: Payer: BC Managed Care – PPO | Admitting: Family Medicine

## 2019-06-14 ENCOUNTER — Other Ambulatory Visit: Payer: Self-pay

## 2019-06-14 ENCOUNTER — Encounter: Payer: Self-pay | Admitting: Family Medicine

## 2019-06-14 VITALS — BP 90/70 | HR 74 | Temp 98.5°F | Ht 69.75 in | Wt 167.0 lb

## 2019-06-14 DIAGNOSIS — E538 Deficiency of other specified B group vitamins: Secondary | ICD-10-CM

## 2019-06-14 DIAGNOSIS — G629 Polyneuropathy, unspecified: Secondary | ICD-10-CM | POA: Insufficient documentation

## 2019-06-14 DIAGNOSIS — G6289 Other specified polyneuropathies: Secondary | ICD-10-CM | POA: Diagnosis not present

## 2019-06-14 DIAGNOSIS — E559 Vitamin D deficiency, unspecified: Secondary | ICD-10-CM | POA: Diagnosis not present

## 2019-06-14 DIAGNOSIS — H8111 Benign paroxysmal vertigo, right ear: Secondary | ICD-10-CM | POA: Insufficient documentation

## 2019-06-14 DIAGNOSIS — E063 Autoimmune thyroiditis: Secondary | ICD-10-CM | POA: Diagnosis not present

## 2019-06-14 DIAGNOSIS — E038 Other specified hypothyroidism: Secondary | ICD-10-CM | POA: Diagnosis not present

## 2019-06-14 LAB — CBC WITH DIFFERENTIAL/PLATELET
Basophils Absolute: 0.1 10*3/uL (ref 0.0–0.1)
Basophils Relative: 1.7 % (ref 0.0–3.0)
Eosinophils Absolute: 0.2 10*3/uL (ref 0.0–0.7)
Eosinophils Relative: 3.7 % (ref 0.0–5.0)
HCT: 38 % (ref 36.0–46.0)
Hemoglobin: 12.6 g/dL (ref 12.0–15.0)
Lymphocytes Relative: 33.7 % (ref 12.0–46.0)
Lymphs Abs: 1.8 10*3/uL (ref 0.7–4.0)
MCHC: 33.1 g/dL (ref 30.0–36.0)
MCV: 94.6 fl (ref 78.0–100.0)
Monocytes Absolute: 0.5 10*3/uL (ref 0.1–1.0)
Monocytes Relative: 9.1 % (ref 3.0–12.0)
Neutro Abs: 2.7 10*3/uL (ref 1.4–7.7)
Neutrophils Relative %: 51.8 % (ref 43.0–77.0)
Platelets: 261 10*3/uL (ref 150.0–400.0)
RBC: 4.02 Mil/uL (ref 3.87–5.11)
RDW: 14.4 % (ref 11.5–15.5)
WBC: 5.2 10*3/uL (ref 4.0–10.5)

## 2019-06-14 LAB — T4, FREE: Free T4: 0.62 ng/dL (ref 0.60–1.60)

## 2019-06-14 LAB — T3: T3, Total: 138 ng/dL (ref 76–181)

## 2019-06-14 LAB — VITAMIN D 25 HYDROXY (VIT D DEFICIENCY, FRACTURES): VITD: 49.75 ng/mL (ref 30.00–100.00)

## 2019-06-14 LAB — TSH: TSH: 1.33 u[IU]/mL (ref 0.35–4.50)

## 2019-06-14 LAB — FERRITIN: Ferritin: 17.9 ng/mL (ref 10.0–291.0)

## 2019-06-14 LAB — HEMOGLOBIN A1C: Hgb A1c MFr Bld: 5.8 % (ref 4.6–6.5)

## 2019-06-14 LAB — VITAMIN B12: Vitamin B-12: 398 pg/mL (ref 211–911)

## 2019-06-14 MED ORDER — MECLIZINE HCL 25 MG PO TABS: 25.0000 mg | ORAL_TABLET | Freq: Three times a day (TID) | ORAL | 0 refills | Status: DC | PRN

## 2019-06-14 NOTE — Assessment & Plan Note (Addendum)
>  25 minutes spent in face to face time with patient, >50% spent in counselling or coordination of care discussing BPV and neuropathy. Had horizontal nystagmus on exam.  eRX refills for meclizine sent. Home eply maneuvers instructions given. Call or send my chart message prn if these symptoms worsen or fail to improve as anticipated.

## 2019-06-14 NOTE — Assessment & Plan Note (Signed)
Progressive- h/o b12 deficiency, hypothyroidism. Will check labs today as part of initial work up. The patient indicates understanding of these issues and agrees with the plan. Orders Placed This Encounter  Procedures  . B12  . Vitamin D (25 hydroxy)  . Hemoglobin A1c  . TSH  . T4, free  . T3  . CBC with Differential/Platelet  . Ferritin

## 2019-06-14 NOTE — Patient Instructions (Signed)

## 2019-06-14 NOTE — Progress Notes (Signed)
Subjective:   Patient ID: [name], female    DOB: August 02, 1961, 58 y.o.   MRN: [id number]  [name] is a pleasant 58 y.o. year old female who presents to clinic today with Dizziness (x 5 months, worsen in last 2 weeks.  Pt explains that it feel more like motion sickness and happening more often.) and Peripheral Neuropathy (In hands and feet.  Pt explains that it has been happening for years and would like to discuss during visit.)  on 06/14/2019  HPI:  Vertigo- has had it intermittent vertigo for 9 years.  Overall she has done well.   Over the past months, she has had more episodes, worse in thel last 2 weeks.  Feels more like motion sickness than her typical vertigo.  Meclizine has helped.  She has been doing a lot of up and down movements since they are packing boxes for their move.  No vomiting.  Peripheral neuropathy- bilateral hands and feet, happening for years. Initially it was felt it was due to her topamax but she is no longer taking this. Lab Results  Component Value Date   [id number] 09/14/2018   Lab Results  Component Value Date   VD25OH 63.81 09/14/2018   VD25OH 53.08 02/11/2018   VD25OH 36.73 08/12/2016   Hypothyroidism- on armour.  Lab Results  Component Value Date   TSH 1.17 09/14/2018   T3TOTAL 136 09/14/2018     Current Outpatient Medications on File Prior to Visit  Medication Sig Dispense Refill  . ALPRAZolam (XANAX) 0.25 MG tablet TAKE 1 TABLET BY MOUTH EVERY MORNING AS NEEDED FOR ANXIETY AND 2 TABLETS EVERY EVENING AS NEEDED FOR INSOMNIA 60 tablet 5  . ARMOUR THYROID 60 MG tablet TAKE 1 TABLET BY MOUTH DAILY BEFORE BREAKFAST 90 tablet 1  . cetirizine (ZYRTEC) 10 MG tablet Take 10 mg by mouth at bedtime.    . Cholecalciferol (VITAMIN D PO) Take by mouth daily.    . fexofenadine (ALLEGRA) 60 MG tablet Take 60 mg by mouth daily.    . fluticasone (FLONASE) 50 MCG/ACT nasal spray USE 2 SPRAYS INTO EACH NOSTRIL DAILY 16 g 11  .  hydrOXYzine (ATARAX/VISTARIL) 50 MG tablet Take 1 tablet (50 mg total) by mouth every 6 (six) hours as needed. 30 tablet 0  . Magnesium Citrate 100 MG TABS Take 200 mg by mouth daily.    . Melatonin 10 MG TABS Take 10 mg by mouth at bedtime.    . Multiple Vitamin (MULTIVITAMIN) capsule Take 1 capsule by mouth daily.      . Omega-3 Fatty Acids (FISH OIL PO) Take by mouth daily.    Marland Kitchen OVER THE COUNTER MEDICATION CBD Oil    . OVER THE COUNTER MEDICATION Seleium 200 mg    . TURMERIC CURCUMIN PO Take 1 tablet by mouth daily.    . vitamin E 400 UNIT capsule Take 400 Units by mouth daily.    . baclofen (LIORESAL) 10 MG tablet as needed.      No current facility-administered medications on file prior to visit.     No Known Allergies  Past Medical History:  Diagnosis Date  . Allergic rhinitis   . Cervical dysplasia 1990   s/p conization  . Chronic sinusitis   . Depression   . Fibromyalgia   . Hip fracture, left ([place]) 2005   s/p ORIF  . Hypothyroidism   . Migraine     Past Surgical History:  Procedure Laterality Date  . BREAST  ENHANCEMENT SURGERY  2007  . BREAST IMPLANT REMOVAL Bilateral 05/21/2018  . DILATION AND CURETTAGE OF UTERUS  1989  . ORIF HIP FRACTURE  2005   [place]  . TONSILLECTOMY AND ADENOIDECTOMY      Family History  Problem Relation Age of Onset  . Arthritis Mother        RA and OA  . Cancer Maternal Aunt 48       breast CA  . Colon cancer Neg Hx   . Pancreatic cancer Neg Hx   . Stomach cancer Neg Hx     Social History   Socioeconomic History  . Marital status: Married    Spouse name: Not on file  . Number of children: Not on file  . Years of education: Not on file  . Highest education level: Not on file  Occupational History  . Not on file  Social Needs  . Financial resource strain: Not on file  . Food insecurity    Worry: Not on file    Inability: Not on file  . Transportation needs    Medical: Not on file    Non-medical: Not on file  Tobacco  Use  . Smoking status: Never Smoker  . Smokeless tobacco: Never Used  Substance and Sexual Activity  . Alcohol use: Yes    Comment: rarely  . Drug use: No  . Sexual activity: Not on file  Lifestyle  . Physical activity    Days per week: Not on file    Minutes per session: Not on file  . Stress: Not on file  Relationships  . Social Herbalist on phone: Not on file    Gets together: Not on file    Attends religious service: Not on file    Active member of club or organization: Not on file    Attends meetings of clubs or organizations: Not on file    Relationship status: Not on file  . Intimate partner violence    Fear of current or ex partner: Not on file    Emotionally abused: Not on file    Physically abused: Not on file    Forced sexual activity: Not on file  Other Topics Concern  . 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   The PMH, PSH, Social History, Family History, Medications, and allergies have been reviewed in [facility], and have been updated if relevant.   Review of Systems  Constitutional: Negative.   HENT: Negative.   Respiratory: Negative.   Gastrointestinal: Positive for nausea. Negative for vomiting.  Musculoskeletal: Negative.   Skin: Negative.   Neurological: Positive for dizziness and numbness. Negative for tremors, seizures, syncope, facial asymmetry, speech difficulty, weakness, light-headedness and headaches.  Hematological: Negative.   Psychiatric/Behavioral: Negative.   All other systems reviewed and are negative.      Objective:    BP 90/70 (BP Location: Left Arm, Patient Position: Sitting, Cuff Size: Normal)   Pulse 74   Temp 98.5 F (36.9 C) (Oral)   Ht 5' 9.75" (1.772 m)   Wt 167 lb (75.8 kg)   LMP 02/24/2019   SpO2 98%   BMI 24.13 kg/m    Physical Exam   General:  Well-developed,well-nourished,in no acute distress; alert,appropriate and cooperative throughout  examination Head:  normocephalic and atraumatic.   Eyes:  vision grossly intact, PERRL + horizontal nytagmus with dix hallpike Ears:  R ear normal and L ear  normal externally, TMs clear bilaterally Nose:  no external deformity.   Mouth:  good dentition.   Neck:  No deformities, masses, or tenderness noted. Lungs:  Normal respiratory effort, chest expands symmetrically. Lungs are clear to auscultation, no crackles or wheezes. Heart:  Normal rate and regular rhythm. S1 and S2 normal without gallop, murmur, click, rub or other extra sounds. Abdomen:  Bowel sounds positive,abdomen soft and non-tender without masses, organomegaly or hernias noted. Msk:  No deformity or scoliosis noted of thoracic or lumbar spine.   Extremities:  No clubbing, cyanosis, edema, or deformity noted with normal full range of motion of all joints.   Neurologic:  alert & oriented X3 and gait normal.   Skin:  Intact without suspicious lesions or rashes Cervical Nodes:  No lymphadenopathy noted Axillary Nodes:  No palpable lymphadenopathy Psych:  Cognition and judgment appear intact. Alert and cooperative with normal attention span and concentration. No apparent delusions, illusions, hallucinations       Assessment & Plan:   Hypothyroidism due to Hashimoto's thyroiditis  Other polyneuropathy - Plan: B12, Vitamin D (25 hydroxy), Hemoglobin A1c, TSH, T4, free, T3, CBC with Differential/Platelet, Ferritin  Vitamin B12 deficiency  Vitamin D deficiency  BPV (benign positional vertigo), right No follow-ups on file.

## 2019-06-15 ENCOUNTER — Encounter: Payer: Self-pay | Admitting: Family Medicine

## 2019-06-18 ENCOUNTER — Ambulatory Visit: Payer: Self-pay

## 2019-06-18 NOTE — Telephone Encounter (Signed)
Pt thinks she has a stye on her right eye; she has been applying hot compresses; she noticed that her eye was itchy and watery on 06/15/2019; she has swelling in the upper lid and corner which she noticed 06/17/2019. The pt says that her sclera is red under her eyelid; she says the inner corner feels scratchy and irritated, but she denies pain; recommendations made per nurse triage protocol; she verbalized understanding; the pt sees Dr Deborra Medina, Audrie Lia; pt transferred to [facility] for scheduling.  Reason for Disposition . [1] MILD eyelid swelling (puffiness) AND [2] sinus pain or pressure  Answer Assessment - Initial Assessment Questions 1. ONSET: "When did the swelling start?" (e.g., minutes, hours, days)    1022/2020. 2. LOCATION: "What part of the eyelids is swollen?"     Right upper lid and corner 3. SEVERITY: "How swollen is it?"    mild to moderate 4. ITCHING: "Is there any itching?" If so, ask: "How much?"   (Scale 1-10; mild, moderate or severe)    mild 5. PAIN: "Is the swelling painful to touch?" If so, ask: "How painful is it?"   (Scale 1-10; mild, moderate or severe)     no 6. FEVER: "Do you have a fever?" If so, ask: "What is it, how was it measured, and when did it start?"      no 7. CAUSE: "What do you think is causing the swelling?"     ? stye 8. RECURRENT SYMPTOM: "Have you had eyelid swelling before?" If so, ask: "When was the last time?" "What happened that time?"     no 9. OTHER SYMPTOMS: "Do you have any other symptoms?" (e.g., blurred vision, eye discharge, rash, runny nose)     Watery clear discharge 10. PREGNANCY: "Is there any chance you are pregnant?" "When was your last menstrual period?"      No perimenopause LMP 02/27/2019  Protocols used: EYE - [facility]

## 2019-06-22 ENCOUNTER — Ambulatory Visit: Payer: BC Managed Care – PPO | Admitting: Family Medicine

## 2019-07-12 ENCOUNTER — Telehealth (INDEPENDENT_AMBULATORY_CARE_PROVIDER_SITE_OTHER): Payer: BC Managed Care – PPO | Admitting: Family Medicine

## 2019-07-12 ENCOUNTER — Encounter: Payer: Self-pay | Admitting: Family Medicine

## 2019-07-12 ENCOUNTER — Other Ambulatory Visit: Payer: Self-pay

## 2019-07-12 VITALS — BP 93/64 | Temp 98.6°F

## 2019-07-12 DIAGNOSIS — Z20828 Contact with and (suspected) exposure to other viral communicable diseases: Secondary | ICD-10-CM

## 2019-07-12 DIAGNOSIS — J40 Bronchitis, not specified as acute or chronic: Secondary | ICD-10-CM | POA: Diagnosis not present

## 2019-07-12 DIAGNOSIS — Z20822 Contact with and (suspected) exposure to covid-19: Secondary | ICD-10-CM

## 2019-07-12 MED ORDER — PREDNISONE 10 MG PO TABS: ORAL_TABLET | ORAL | 0 refills | Status: DC

## 2019-07-12 MED ORDER — DOXYCYCLINE HYCLATE 100 MG PO TABS: 100.0000 mg | ORAL_TABLET | Freq: Two times a day (BID) | ORAL | 0 refills | Status: DC

## 2019-07-12 NOTE — Assessment & Plan Note (Signed)
Given duration and progression of symptoms, will treat for bacterial bronchitis with doxycyline and steroid burst.  Will also send for covid 19 test at [facility].  Order entered.  She declined my chart home monitoring. Call or send my chart message prn if these symptoms worsen or fail to improve as anticipated. The patient indicates understanding of these issues and agrees with the plan.

## 2019-07-12 NOTE — Progress Notes (Signed)
Virtual Visit via Video   Due to the COVID-19 pandemic, this visit was completed with telemedicine (audio/video) technology to reduce patient and provider exposure as well as to preserve personal protective equipment.   I connected with Rowe Robert by a video enabled telemedicine application and verified that I am speaking with the correct person using two identifiers. Location patient: Home Location provider: [facility], Office Persons participating in the virtual visit: Naadira Schweppe, Arnette Norris, MD   I discussed the limitations of evaluation and management by telemedicine and the availability of in person appointments. The patient expressed understanding and agreed to proceed.  Care Team   Patient Care Team: Lucille Passy, MD as PCP - General (Family Medicine) Rockey Situ Kathlene November, MD as Consulting Physician (Cardiology) Levy Sjogren, MD as Referring Physician (Dermatology) Melissa Montane, MD as Consulting Physician (Otolaryngology) Arvella Nigh, MD as Consulting Physician (Obstetrics and Gynecology) Melrose Nakayama, MD as Consulting Physician (Orthopedic Surgery) Rayne Du, MD as Referring Physician (Plastic Surgery)  Subjective:   HPI:   Cough-  Pt agrees to virtual visit. Sx started as a cold 2-weeks-ago with sinus involvement. The past week she started with a cough from the drainage in the am and has about 45 minutes of a coughing fit then is ok the rest of the day for the cough. Still very congested. She had an inflammed area of eye lid which she self-Tx with an ointment OTC. Still feels like she has grit in her eyes. No eye drainage.  She does feel sinuses has improved but now having more lower respiratory symptoms- wet cough, coughs up a little but not much.   She I taking mucinex, saline spray and flonase.  She has not had sick contacts but is worried about covid because she typically does not get respiratory symptoms.  No lost of taste  or smell.  No known sick contacts.  Review of Systems  Constitutional: Negative for fever.  HENT: Positive for congestion, sinus pain and sore throat.   Respiratory: Positive for cough. Negative for hemoptysis.   Cardiovascular: Negative.   Gastrointestinal: Negative.   Genitourinary: Negative.   Musculoskeletal: Negative.   Psychiatric/Behavioral: Negative.   All other systems reviewed and are negative.    Patient Active Problem List   Diagnosis Date Noted  . Bronchitis with acute wheezing 07/12/2019  . Peripheral neuropathy 06/14/2019  . BPV (benign positional vertigo), right 06/14/2019  . Dust exposure 10/21/2018  . Vitamin D deficiency 09/14/2018  . Loose stools 09/14/2018  . Unintentional weight loss 09/14/2018  . Fatigue 10/01/2017  . Generalized anxiety disorder 11/21/2015  . Arthralgia 08/14/2015  . Vitamin B12 deficiency 08/10/2014  . Insomnia 06/30/2012  . Migraine with status migrainosus 05/23/2011  . Depression   . Hypothyroidism due to Hashimoto's thyroiditis     Social History   Tobacco Use  . Smoking status: Never Smoker  . Smokeless tobacco: Never Used  Substance Use Topics  . Alcohol use: Yes    Comment: rarely    Current Outpatient Medications:  .  ALPRAZolam (XANAX) 0.25 MG tablet, TAKE 1 TABLET BY MOUTH EVERY MORNING AS NEEDED FOR ANXIETY AND 2 TABLETS EVERY EVENING AS NEEDED FOR INSOMNIA, Disp: 60 tablet, Rfl: 5 .  ARMOUR THYROID 60 MG tablet, TAKE 1 TABLET BY MOUTH DAILY BEFORE BREAKFAST, Disp: 90 tablet, Rfl: 1 .  baclofen (LIORESAL) 10 MG tablet, as needed. , Disp: , Rfl:  .  cetirizine (ZYRTEC) 10 MG tablet, Take 10 mg  by mouth at bedtime., Disp: , Rfl:  .  Cholecalciferol (VITAMIN D PO), Take by mouth daily., Disp: , Rfl:  .  fexofenadine (ALLEGRA) 60 MG tablet, Take 60 mg by mouth daily., Disp: , Rfl:  .  fluticasone (FLONASE) 50 MCG/ACT nasal spray, USE 2 SPRAYS INTO EACH NOSTRIL DAILY, Disp: 16 g, Rfl: 11 .  hydrOXYzine (ATARAX/VISTARIL)  50 MG tablet, Take 1 tablet (50 mg total) by mouth every 6 (six) hours as needed., Disp: 30 tablet, Rfl: 0 .  Magnesium Citrate 100 MG TABS, Take 200 mg by mouth daily., Disp: , Rfl:  .  meclizine (ANTIVERT) 25 MG tablet, Take 1 tablet (25 mg total) by mouth 3 (three) times daily as needed for dizziness., Disp: 30 tablet, Rfl: 0 .  Melatonin 10 MG TABS, Take 10 mg by mouth at bedtime., Disp: , Rfl:  .  Multiple Vitamin (MULTIVITAMIN) capsule, Take 1 capsule by mouth daily.  , Disp: , Rfl:  .  Omega-3 Fatty Acids (FISH OIL PO), Take by mouth daily., Disp: , Rfl:  .  OVER THE COUNTER MEDICATION, CBD Oil, Disp: , Rfl:  .  OVER THE COUNTER MEDICATION, Seleium 200 mg, Disp: , Rfl:  .  TURMERIC CURCUMIN PO, Take 1 tablet by mouth daily., Disp: , Rfl:  .  vitamin E 400 UNIT capsule, Take 400 Units by mouth daily., Disp: , Rfl:  .  doxycycline (VIBRA-TABS) 100 MG tablet, Take 1 tablet (100 mg total) by mouth 2 (two) times daily., Disp: 14 tablet, Rfl: 0 .  predniSONE (DELTASONE) 10 MG tablet, 3 tabs by mouth x 3 days, 2 tabs by mouth x 2 days, 1 tab by mouth x 2 days and stop., Disp: 15 tablet, Rfl: 0  No Known Allergies  Objective:  BP 93/64   Temp 98.6 F (37 C)   LMP 07/05/2019   VITALS: Per patient if applicable, see vitals. GENERAL: Alert, appears well and in no acute distress. HEENT: Atraumatic, conjunctiva clear, no obvious abnormalities on inspection of external nose and ears. NECK: Normal movements of the head and neck. CARDIOPULMONARY: No increased WOB. Speaking in clear sentences. I:E ratio WNL.  MS: Moves all visible extremities without noticeable abnormality. PSYCH: Pleasant and cooperative, well-groomed. Speech normal rate and rhythm. Affect is appropriate. Insight and judgement are appropriate. Attention is focused, linear, and appropriate.  NEURO: CN grossly intact. Oriented as arrived to appointment on time with no prompting. Moves both UE equally.  SKIN: No obvious lesions,  wounds, erythema, or cyanosis noted on face or hands.  Depression screen [facility] 2/9 06/14/2019 09/14/2018 10/01/2017  Decreased Interest 0 0 0  Down, Depressed, Hopeless 0 0 0  PHQ - 2 Score 0 0 0  Altered sleeping 0 - 0  Tired, decreased energy 0 - 0  Change in appetite 0 - 0  Feeling bad or failure about yourself  0 - 0  Trouble concentrating 0 - 0  Moving slowly or fidgety/restless 0 - 0  Suicidal thoughts 0 - 0  PHQ-9 Score 0 - 0  Difficult doing work/chores Not difficult at all - Not difficult at all    Assessment and Plan:   Joie was seen today for cough.  Diagnoses and all orders for this visit:  Suspected COVID-19 virus infection -     Novel Coronavirus, NAA ([facility])  Bronchitis with acute wheezing  Other orders -     doxycycline (VIBRA-TABS) 100 MG tablet; Take 1 tablet (100 mg total) by mouth 2 (two) times  daily. -     predniSONE (DELTASONE) 10 MG tablet; 3 tabs by mouth x 3 days, 2 tabs by mouth x 2 days, 1 tab by mouth x 2 days and stop.    Marland Kitchen COVID-19 Education: The signs and symptoms of COVID-19 were discussed with the patient and how to seek care for testing if needed. The importance of social distancing was discussed today. . Reviewed expectations re: course of current medical issues. . Discussed self-management of symptoms. . Outlined signs and symptoms indicating need for more acute intervention. . Patient verbalized understanding and all questions were answered. Marland Kitchen Health Maintenance issues including appropriate healthy diet, exercise, and smoking avoidance were discussed with patient. . See orders for this visit as documented in the electronic medical record.  Arnette Norris, MD  Records requested if needed. Time spent: 25 minutes, of which >50% was spent in obtaining information about her symptoms, reviewing her previous labs, evaluations, and treatments, counseling her about her condition (please see the discussed topics above), and developing a plan to  further investigate it; she had a number of questions which I addressed.

## 2019-07-14 LAB — NOVEL CORONAVIRUS, NAA: SARS-CoV-2, NAA: NOT DETECTED

## 2019-07-19 ENCOUNTER — Ambulatory Visit: Payer: Self-pay | Admitting: *Deleted

## 2019-07-19 ENCOUNTER — Ambulatory Visit: Payer: Self-pay

## 2019-07-19 NOTE — Telephone Encounter (Signed)
Please see message and advise.  Thank you. ° °

## 2019-07-19 NOTE — Telephone Encounter (Signed)
Pt called and stated that her husband tested positive for COVID; she says that she feels like she has bronchitis; this is her 3rd week feeling sick; she was tested at [facility], she says that she is unwell and had a virtual appt with Dr Deborra Medina and a COVID test on 07/12/2019; she was prescribed Doxycycline and prednisone at that time; the pt says that husband has subsequently tested positive, and she was told to notify her PCP; she does have SOB with exertion, but is "fine when sitting"; she said that she felt winded when moving heavy boxes; advice/ recommendation made for pt to be seen in UC or ED  the pt says she has finished her antibiotic, and has 1 day left until she finishes her prednisone; she says she is not feeling better,explained that she her symptoms merit a face to face visit; the pt would like to see Dr Deborra Medina instead, and says she go to [facility]; if her symptoms worsen; the pt can be contacted at [phone]; she sees Dr Deborra Medina, Audrie Lia; will route to office for notification.  Reason for Disposition . Requesting regular office appointment  Answer Assessment - Initial Assessment Questions 1. REASON FOR CALL or QUESTION: "What is your reason for calling today?" or "How can I best help you?" or "What question do you have that I can help answer?"     Pt would like to let PCP know her husband tested positive COVID, and she is not feeling better after office visit.  Protocols used: INFORMATION ONLY CALL - NO TRIAGE-A-AH

## 2019-07-19 NOTE — Telephone Encounter (Signed)
Please see message . Thank you .

## 2019-07-19 NOTE — Telephone Encounter (Signed)
Incoming call from Patient who sates that she and her husband tested for covid-19.  Husband tested positive  For covid-19 wife (patient ) was negative.  Provided information related to isolation.  Voiced understanding.  As related to Patient and husband. Denies fever no vomiting.  Reports SOB with exertion only.  Reports having Bronchitis.   Denies any other Sx.

## 2019-07-19 NOTE — Telephone Encounter (Signed)
Given recent spikes in covid, I cannot see her in the office with her current symptoms.  We are opening a respiratory clinic for our patients but I don't think that has started yet.  She does need to go to UC as advised unfortunately.

## 2019-07-20 ENCOUNTER — Encounter: Payer: Self-pay | Admitting: Family Medicine

## 2019-07-20 NOTE — Telephone Encounter (Signed)
Message noted.  I spoke with pt and she informed me that she just wanted Dr. Deborra Medina to be aware of information.

## 2019-07-20 NOTE — Telephone Encounter (Signed)
I spoke with [name] and she informed me that she didn't expect to be seen, but she just wanted Dr. Deborra Medina to be aware of issue.  I informed [name] of Dr. Deborra Medina message given.

## 2019-07-26 ENCOUNTER — Ambulatory Visit
Admission: EM | Admit: 2019-07-26 | Discharge: 2019-07-26 | Disposition: A | Discharge status: Home / Self Care | Payer: BC Managed Care – PPO | Attending: Emergency Medicine | Admitting: Emergency Medicine

## 2019-07-26 ENCOUNTER — Telehealth: Payer: Self-pay

## 2019-07-26 ENCOUNTER — Encounter: Payer: Self-pay | Admitting: Emergency Medicine

## 2019-07-26 ENCOUNTER — Other Ambulatory Visit: Payer: Self-pay

## 2019-07-26 DIAGNOSIS — U071 COVID-19: Secondary | ICD-10-CM | POA: Diagnosis not present

## 2019-07-26 LAB — POC SARS CORONAVIRUS 2 AG -  ED: SARS Coronavirus 2 Ag: POSITIVE — AB

## 2019-07-26 MED ORDER — ALBUTEROL SULFATE HFA 108 (90 BASE) MCG/ACT IN AERS: 2.0000 | INHALATION_SPRAY | RESPIRATORY_TRACT | 0 refills | Status: DC | PRN

## 2019-07-26 MED ORDER — BUDESONIDE 32 MCG/ACT NA SUSP: 1.0000 | Freq: Every day | NASAL | 0 refills | Status: DC

## 2019-07-26 MED ORDER — PREDNISONE 20 MG PO TABS: 20.0000 mg | ORAL_TABLET | Freq: Every day | ORAL | 0 refills | Status: DC

## 2019-07-26 NOTE — Telephone Encounter (Signed)
Medication was filled today by [facility], [place], PA-C.

## 2019-07-26 NOTE — Telephone Encounter (Signed)
Copied from [place] [id number]. Topic: Quick Communication - See Telephone Encounter >> Jul 26, 2019  8:48 AM Loma Boston wrote: CRM for notification. See Telephone encounter for: 07/26/19. Pt going to urgent care/much worse making sure does not have covid, still wants nasal spray refill in MyChart message

## 2019-07-26 NOTE — ED Triage Notes (Signed)
Pt presents to [facility] for assessment of 1 month of feeling sick.   2 weeks ago, patient was seen virtually by PCP who diagnosed with bronchitis and given antibiotic and steroid with some improvement.   10/19 - Husband positive for COVID-19, states a few days later she became more fatigued, body aches, URI symptoms worsened.  Patient states Saturday she felt "okay", yesterday worse again, and today she is experiencing chest congestion that is worse than she has ever experienced before.

## 2019-07-26 NOTE — Discharge Instructions (Addendum)
Your COVID test is positive - it is important to quarantine / isolate at home for 14 days since symptom onset. If you would like further evaluation for persistent or worsening symptoms, you may schedule an E-visit or virtual (video) visit throughout the [facility] app or website.  PLEASE NOTE: If you develop severe chest pain or shortness of breath please go to the ER or call 9-1-1 for further evaluation --> DO NOT schedule electronic or virtual visits for this. Please call our office for further guidance / recommendations as needed.

## 2019-07-26 NOTE — ED Provider Notes (Signed)
[facility]    CSN: [id number] Arrival date & time: 07/26/19  [id number]      History   Chief Complaint Chief Complaint  Patient presents with  . APPT: 10am  . Cough    HPI [name] is a 57 y.o. female with history of migraine, hypothyroidism, fibromyalgia, allergies presenting for persistent cough.  Please see nursing note, which was reviewed by me at time appointment, for additional HPI.  Patient evaluated for this on 11/16 by her family medicine provider.  Records reviewed by me at time appointment: Patient underwent testing for COVID-19: Negative, was given doxycycline, prednisone taper.  Max dose was 30 mg.  Patient reports improvement since starting medications: Finished antibiotics at this time.  States prednisone causes little bit of GI upset, though she did no significant provement.  Of note, patient has mild, persistent dry cough without hemoptysis.  Requesting refill on inhaler as she is running low.  Reports only needing to use this only few times a week.    Past Medical History:  Diagnosis Date  . Allergic rhinitis   . Cervical dysplasia 1990   s/p conization  . Chronic sinusitis   . Depression   . Fibromyalgia   . Hip fracture, left ([place]) 2005   s/p ORIF  . Hypothyroidism   . Migraine     Patient Active Problem List   Diagnosis Date Noted  . Bronchitis with acute wheezing 07/12/2019  . Peripheral neuropathy 06/14/2019  . BPV (benign positional vertigo), right 06/14/2019  . Dust exposure 10/21/2018  . Vitamin D deficiency 09/14/2018  . Loose stools 09/14/2018  . Unintentional weight loss 09/14/2018  . Fatigue 10/01/2017  . Generalized anxiety disorder 11/21/2015  . Arthralgia 08/14/2015  . Vitamin B12 deficiency 08/10/2014  . Insomnia 06/30/2012  . Migraine with status migrainosus 05/23/2011  . Depression   . Hypothyroidism due to Hashimoto's thyroiditis     Past Surgical History:  Procedure Laterality Date  . BREAST ENHANCEMENT  SURGERY  2007  . BREAST IMPLANT REMOVAL Bilateral 05/21/2018  . DILATION AND CURETTAGE OF UTERUS  1989  . ORIF HIP FRACTURE  2005   [place]  . TONSILLECTOMY AND ADENOIDECTOMY      OB History   No obstetric history on file.      Home Medications    Prior to Admission medications   Medication Sig Start Date End Date Taking? Authorizing Provider  albuterol (VENTOLIN HFA) 108 (90 Base) MCG/ACT inhaler Inhale 2 puffs into the lungs every 4 (four) hours as needed for wheezing or shortness of breath. 07/26/19   Hall-Potvin, [place], PA-C  ALPRAZolam (XANAX) 0.25 MG tablet TAKE 1 TABLET BY MOUTH EVERY MORNING AS NEEDED FOR ANXIETY AND 2 TABLETS EVERY EVENING AS NEEDED FOR INSOMNIA 10/21/18   Lucille Passy, MD  ARMOUR THYROID 60 MG tablet TAKE 1 TABLET BY MOUTH DAILY BEFORE BREAKFAST 03/17/19   Lucille Passy, MD  baclofen (LIORESAL) 10 MG tablet as needed.  07/08/11   [provider]  budesonide (RHINOCORT ALLERGY) 32 MCG/ACT nasal spray Place 1 spray into both nostrils daily. 07/26/19   Hall-Potvin, [place], PA-C  cetirizine (ZYRTEC) 10 MG tablet Take 10 mg by mouth at bedtime.    [provider]  Cholecalciferol (VITAMIN D PO) Take by mouth daily.    [provider]  fexofenadine (ALLEGRA) 60 MG tablet Take 60 mg by mouth daily.    [provider]  fluticasone (FLONASE) 50 MCG/ACT nasal spray USE 2 SPRAYS INTO  EACH NOSTRIL DAILY 07/27/19   Lucille Passy, MD  hydrOXYzine (ATARAX/VISTARIL) 50 MG tablet Take 1 tablet (50 mg total) by mouth every 6 (six) hours as needed. 10/22/18   Lucille Passy, MD  Magnesium Citrate 100 MG TABS Take 200 mg by mouth daily.    [provider]  meclizine (ANTIVERT) 25 MG tablet Take 1 tablet (25 mg total) by mouth 3 (three) times daily as needed for dizziness. 06/14/19   Lucille Passy, MD  Melatonin 10 MG TABS Take 10 mg by mouth at bedtime.    [provider]  Multiple Vitamin (MULTIVITAMIN) capsule Take 1  capsule by mouth daily.      [provider]  Omega-3 Fatty Acids (FISH OIL PO) Take by mouth daily.    [provider]  OVER THE COUNTER MEDICATION CBD Oil    [provider]  OVER THE COUNTER MEDICATION Seleium 200 mg    [provider]  predniSONE (DELTASONE) 20 MG tablet Take 1 tablet (20 mg total) by mouth daily. 07/26/19   Hall-Potvin, [place], PA-C  TURMERIC CURCUMIN PO Take 1 tablet by mouth daily.    [provider]  vitamin E 400 UNIT capsule Take 400 Units by mouth daily.    [provider]    Family History Family History  Problem Relation Age of Onset  . Arthritis Mother        RA and OA  . Cancer Maternal Aunt 48       breast CA  . Colon cancer Neg Hx   . Pancreatic cancer Neg Hx   . Stomach cancer Neg Hx     Social History Social History   Tobacco Use  . Smoking status: Never Smoker  . Smokeless tobacco: Never Used  Substance Use Topics  . Alcohol use: Yes    Comment: rarely  . Drug use: No     Allergies   Patient has no known allergies.   Review of Systems Review of Systems  Constitutional: Negative for fatigue and fever.  HENT: Negative for ear pain, sinus pain, sore throat and voice change.   Eyes: Negative for pain, redness and visual disturbance.  Respiratory: Positive for cough. Negative for chest tightness, shortness of breath and wheezing.   Cardiovascular: Negative for chest pain and palpitations.  Gastrointestinal: Negative for abdominal pain, diarrhea and vomiting.  Musculoskeletal: Negative for arthralgias and myalgias.  Skin: Negative for rash and wound.  Neurological: Negative for syncope and headaches.     Physical Exam Triage Vital Signs ED Triage Vitals  Enc Vitals Group     BP 07/26/19 1006 124/85     Pulse Rate 07/26/19 1006 84     Resp 07/26/19 1006 16     Temp 07/26/19 1006 99.4 F (37.4 C)     Temp Source 07/26/19 1006 Temporal     SpO2 07/26/19 1006 96 %      Weight --      Height --      Head Circumference --      Peak Flow --      Pain Score 07/26/19 1008 0     Pain Loc --      Pain Edu? --      Excl. in [place]? --    No data found.  Updated Vital Signs BP 124/85 (BP Location: Left Arm)   Pulse 84   Temp 99.4 F (37.4 C) (Temporal)   Resp 16   LMP 07/05/2019  SpO2 96%   Visual Acuity Right Eye Distance:   Left Eye Distance:   Bilateral Distance:    Right Eye Near:   Left Eye Near:    Bilateral Near:     Physical Exam Constitutional:      General: She is not in acute distress. HENT:     Head: Normocephalic and atraumatic.  Eyes:     General: No scleral icterus.    Pupils: Pupils are equal, round, and reactive to light.  Cardiovascular:     Rate and Rhythm: Normal rate.  Pulmonary:     Effort: Pulmonary effort is normal. No respiratory distress.     Breath sounds: No stridor. Wheezing present. No rhonchi or rales.  Skin:    Coloration: Skin is not jaundiced or pale.  Neurological:     Mental Status: She is alert and oriented to person, place, and time.      UC Treatments / Results  Labs (all labs ordered are listed, but only abnormal results are displayed) Labs Reviewed  POC SARS CORONAVIRUS 2 AG -  ED - Abnormal; Notable for the following components:      Result Value   SARS Coronavirus 2 Ag Positive (*)    All other components within normal limits    EKG   Radiology No results found.  Procedures Procedures (including critical care time)  Medications Ordered in UC Medications - No data to display  Initial Impression / Assessment and Plan / UC Course  I have reviewed the triage vital signs and the nursing notes.  Pertinent labs & imaging results that were available during my care of the patient were reviewed by me and considered in my medical decision making (see chart for details).     Patient afebrile, nontoxic.  POC Covid done in office, reviewed by me: Positive.  Patient quarantine for 14  days and symptom onset.  Patient requesting Rhinocort: Sent.  Refill for albuterol, will add prednisone for additional symptom relief second to lung exam with diffuse wheezing.  Return precautions discussed, patient verbalized understanding and is agreeable to plan. Final Clinical Impressions(s) / UC Diagnoses   Final diagnoses:  [id number] virus infection     Discharge Instructions     Your COVID test is positive - it is important to quarantine / isolate at home for 14 days since symptom onset. If you would like further evaluation for persistent or worsening symptoms, you may schedule an E-visit or virtual (video) visit throughout the [facility] app or website.  PLEASE NOTE: If you develop severe chest pain or shortness of breath please go to the ER or call 9-1-1 for further evaluation --> DO NOT schedule electronic or virtual visits for this. Please call our office for further guidance / recommendations as needed.    ED Prescriptions    Medication Sig Dispense Auth. Provider   predniSONE (DELTASONE) 20 MG tablet Take 1 tablet (20 mg total) by mouth daily. 5 tablet Hall-Potvin, [place], PA-C   albuterol (VENTOLIN HFA) 108 (90 Base) MCG/ACT inhaler Inhale 2 puffs into the lungs every 4 (four) hours as needed for wheezing or shortness of breath. 18 g Hall-Potvin, [place], PA-C   budesonide (RHINOCORT ALLERGY) 32 MCG/ACT nasal spray Place 1 spray into both nostrils daily. 8.6 g Hall-Potvin, [place], PA-C     PDMP not reviewed this encounter.   Neldon Mc Conner, [place] 07/27/19 1850

## 2019-07-26 NOTE — ED Notes (Signed)
Patient able to ambulate independently  

## 2019-07-27 ENCOUNTER — Other Ambulatory Visit: Payer: Self-pay

## 2019-07-27 MED ORDER — FLUTICASONE PROPIONATE 50 MCG/ACT NA SUSP: NASAL | 11 refills | Status: DC

## 2019-07-27 NOTE — Telephone Encounter (Signed)
Sent in refill/thx dmf

## 2019-07-28 ENCOUNTER — Encounter: Payer: Self-pay | Admitting: Family Medicine

## 2019-07-28 ENCOUNTER — Other Ambulatory Visit: Payer: Self-pay | Admitting: Family Medicine

## 2019-08-25 ENCOUNTER — Other Ambulatory Visit: Payer: Self-pay | Admitting: Family Medicine

## 2019-09-07 NOTE — Progress Notes (Signed)
Virtual Visit via Video   Due to the COVID-19 pandemic, this visit was completed with telemedicine (audio/video) technology to reduce patient and provider exposure as well as to preserve personal protective equipment.   I connected with [name] by a video enabled telemedicine application and verified that I am speaking with the correct person using two identifiers. Location patient: Home Location provider: [facility], Office Persons participating in the virtual visit: [name], [name] Norris, MD   I discussed the limitations of evaluation and management by telemedicine and the availability of in person appointments. The patient expressed understanding and agreed to proceed.  Care Team   Patient Care Team: Lucille Passy, MD as PCP - General (Family Medicine) Rockey Situ Kathlene November, MD as Consulting Physician (Cardiology) Levy Sjogren, MD as Referring Physician (Dermatology) Melissa Montane, MD as Consulting Physician (Otolaryngology) Arvella Nigh, MD as Consulting Physician (Obstetrics and Gynecology) Melrose Nakayama, MD as Consulting Physician (Orthopedic Surgery) Rayne Du, MD as Referring Physician (Plastic Surgery)  Subjective:   HPI: Patient agrees to virtual visit discuss cough, anxiety, fibromyalgia.     Review of Systems  Constitutional: Negative for fever and malaise/fatigue.  HENT: Positive for congestion and sore throat. Negative for hearing loss and sinus pain.   Eyes: Negative for blurred vision.  Respiratory: Positive for cough, sputum production and wheezing. Negative for hemoptysis and shortness of breath.   Cardiovascular: Negative for chest pain.  Gastrointestinal: Negative for heartburn.  Genitourinary: Negative for dysuria.  Musculoskeletal: Positive for joint pain and myalgias. Negative for falls.  Skin: Negative for rash.  Neurological: Negative for dizziness and loss of consciousness.  Endo/Heme/Allergies: Does not  bruise/bleed easily.  Psychiatric/Behavioral: Negative for depression, hallucinations, memory loss, substance abuse and suicidal ideas. The patient is nervous/anxious. The patient does not have insomnia.      Patient Active Problem List   Diagnosis Date Noted  . Anxiety 09/08/2019  . Fibromyalgia 09/08/2019  . Cough 09/08/2019  . Bronchitis with acute wheezing 07/12/2019  . Peripheral neuropathy 06/14/2019  . BPV (benign positional vertigo), right 06/14/2019  . Dust exposure 10/21/2018  . Vitamin D deficiency 09/14/2018  . Loose stools 09/14/2018  . Unintentional weight loss 09/14/2018  . Fatigue 10/01/2017  . Generalized anxiety disorder 11/21/2015  . Arthralgia 08/14/2015  . Vitamin B12 deficiency 08/10/2014  . Insomnia 06/30/2012  . Migraine with status migrainosus 05/23/2011  . Depression   . Hypothyroidism due to Hashimoto's thyroiditis     Social History   Tobacco Use  . Smoking status: Never Smoker  . Smokeless tobacco: Never Used  Substance Use Topics  . Alcohol use: Yes    Comment: rarely    Current Outpatient Medications:  .  albuterol (VENTOLIN HFA) 108 (90 Base) MCG/ACT inhaler, Inhale 2 puffs into the lungs every 4 (four) hours as needed for wheezing or shortness of breath., Disp: 18 g, Rfl: 0 .  ALPRAZolam (XANAX) 0.25 MG tablet, Take 1 tid prn, Disp: 90 tablet, Rfl: 5 .  ARMOUR THYROID 60 MG tablet, TAKE 1 TABLET BY MOUTH DAILY BEFORE BREAKFAST, Disp: 90 tablet, Rfl: 1 .  baclofen (LIORESAL) 10 MG tablet, as needed. , Disp: , Rfl:  .  budesonide (RHINOCORT ALLERGY) 32 MCG/ACT nasal spray, Place 1 spray into both nostrils daily., Disp: 8.6 g, Rfl: 0 .  cetirizine (ZYRTEC) 10 MG tablet, Take 10 mg by mouth at bedtime., Disp: , Rfl:  .  Cholecalciferol (VITAMIN D PO), Take by mouth daily., Disp: ,  Rfl:  .  fexofenadine (ALLEGRA) 60 MG tablet, Take 60 mg by mouth daily., Disp: , Rfl:  .  fluticasone (FLONASE) 50 MCG/ACT nasal spray, USE 2 SPRAYS INTO EACH  NOSTRIL DAILY, Disp: 16 g, Rfl: 11 .  hydrOXYzine (ATARAX/VISTARIL) 50 MG tablet, Take 1 tablet (50 mg total) by mouth every 6 (six) hours as needed., Disp: 30 tablet, Rfl: 0 .  Magnesium Citrate 100 MG TABS, Take 200 mg by mouth daily., Disp: , Rfl:  .  meclizine (ANTIVERT) 25 MG tablet, Take 1 tablet (25 mg total) by mouth 3 (three) times daily as needed for dizziness., Disp: 30 tablet, Rfl: 0 .  Melatonin 10 MG TABS, Take 10 mg by mouth at bedtime., Disp: , Rfl:  .  Multiple Vitamin (MULTIVITAMIN) capsule, Take 1 capsule by mouth daily.  , Disp: , Rfl:  .  Omega-3 Fatty Acids (FISH OIL PO), Take by mouth daily., Disp: , Rfl:  .  OVER THE COUNTER MEDICATION, CBD Oil, Disp: , Rfl:  .  OVER THE COUNTER MEDICATION, Seleium 200 mg, Disp: , Rfl:  .  TURMERIC CURCUMIN PO, Take 1 tablet by mouth daily., Disp: , Rfl:  .  vitamin E 400 UNIT capsule, Take 400 Units by mouth daily., Disp: , Rfl:  .  levofloxacin (LEVAQUIN) 500 MG tablet, Take 1 tablet (500 mg total) by mouth daily., Disp: 7 tablet, Rfl: 0 .  methocarbamol (ROBAXIN) 500 MG tablet, Take 1 tablet (500 mg total) by mouth 2 (two) times daily as needed for muscle spasms., Disp: 60 tablet, Rfl: 1  No Known Allergies  Objective:  BP 106/76   Temp 97.6 F (36.4 C)   SpO2 98%   VITALS: Per patient if applicable, see vitals. GENERAL: Alert, appears well and in no acute distress. HEENT: Atraumatic, conjunctiva clear, no obvious abnormalities on inspection of external nose and ears. NECK: Normal movements of the head and neck. CARDIOPULMONARY: No increased WOB. Speaking in clear sentences. I:E ratio WNL.  MS: Moves all visible extremities without noticeable abnormality. PSYCH: Pleasant and cooperative, well-groomed. Speech normal rate and rhythm. Affect is appropriate. Insight and judgement are appropriate. Attention is focused, linear, and appropriate.  NEURO: CN grossly intact. Oriented as arrived to appointment on time with no prompting.  Moves both UE equally.  SKIN: No obvious lesions, wounds, erythema, or cyanosis noted on face or hands.  Depression screen [facility] 2/9 06/14/2019 09/14/2018 10/01/2017  Decreased Interest 0 0 0  Down, Depressed, Hopeless 0 0 0  PHQ - 2 Score 0 0 0  Altered sleeping 0 - 0  Tired, decreased energy 0 - 0  Change in appetite 0 - 0  Feeling bad or failure about yourself  0 - 0  Trouble concentrating 0 - 0  Moving slowly or fidgety/restless 0 - 0  Suicidal thoughts 0 - 0  PHQ-9 Score 0 - 0  Difficult doing work/chores Not difficult at all - Not difficult at all     . COVID-19 Education: The signs and symptoms of COVID-19 were discussed with the patient and how to seek care for testing if needed. The importance of social distancing was discussed today. . Reviewed expectations re: course of current medical issues. . Discussed self-management of symptoms. . Outlined signs and symptoms indicating need for more acute intervention. . Patient verbalized understanding and all questions were answered. Marland Kitchen Health Maintenance issues including appropriate healthy diet, exercise, and smoking avoidance were discussed with patient. . See orders for this visit as documented in  the electronic medical record.  [name] Norris, MD  Records requested if needed. Time spent: 40 minutes, of which >50% was spent in obtaining information about her symptoms, reviewing her previous labs, evaluations, and treatments, counseling her about her condition (please see the discussed topics above), and developing a plan to further investigate it; she had a number of questions which I addressed.   Lab Results  Component Value Date   WBC 5.2 06/14/2019   HGB 12.6 06/14/2019   HCT 38.0 06/14/2019   PLT 261.0 06/14/2019   GLUCOSE 89 09/14/2018   CHOL 157 09/14/2018   TRIG 60.0 09/14/2018   HDL 71.10 09/14/2018   LDLCALC 74 09/14/2018   ALT 12 09/14/2018   AST 17 09/14/2018   NA 139 09/14/2018   K 3.9 09/14/2018   CL 105  09/14/2018   CREATININE 0.94 09/14/2018   BUN 21 09/14/2018   CO2 28 09/14/2018   TSH 1.33 06/14/2019   INR 1.1 (H) 03/03/2018   HGBA1C 5.8 06/14/2019    Lab Results  Component Value Date   TSH 1.33 06/14/2019   Lab Results  Component Value Date   WBC 5.2 06/14/2019   HGB 12.6 06/14/2019   HCT 38.0 06/14/2019   MCV 94.6 06/14/2019   PLT 261.0 06/14/2019   Lab Results  Component Value Date   NA 139 09/14/2018   K 3.9 09/14/2018   CO2 28 09/14/2018   GLUCOSE 89 09/14/2018   BUN 21 09/14/2018   CREATININE 0.94 09/14/2018   BILITOT 0.6 09/14/2018   ALKPHOS 58 09/14/2018   AST 17 09/14/2018   ALT 12 09/14/2018   PROT 6.8 09/14/2018   ALBUMIN 4.3 09/14/2018   CALCIUM 9.6 09/14/2018   GFR 61.66 09/14/2018   Lab Results  Component Value Date   CHOL 157 09/14/2018   Lab Results  Component Value Date   HDL 71.10 09/14/2018   Lab Results  Component Value Date   LDLCALC 74 09/14/2018   Lab Results  Component Value Date   TRIG 60.0 09/14/2018   Lab Results  Component Value Date   CHOLHDL 2 09/14/2018   Lab Results  Component Value Date   HGBA1C 5.8 06/14/2019       Assessment & Plan:   Problem List Items Addressed This Visit      Active Problems   Anxiety    Assessment: Current symptoms: panic attacks, feels shaky. No current suicidal and homicidal ideation. Side effects from treatment: none.   Plan: 1. Medications :Since her father is in the hospital with COVID she has started getting the shakes from an adrenalin dump. She is wondering if there is something that she can take for this. She agrees to try Alprazolam sublingually when she has an episode to try it. Discussed potential risks, expected benefits, possible side effects of the medicine. We also discussed how to take it correctly and dosing instructions.   2. If she has any significant side effects to the medicine, she is to stop it and call for advice. Instructed patient to contact office or  on-call physician promptly should condition worsen or any new symptoms appear.        Relevant Medications   ALPRAZolam (XANAX) 0.25 MG tablet   Fibromyalgia    Deteriorated- Years ago she was Dx with Fibromyalgia by a Dr. Brigitte Pulse who prescribed Ultram for her pain RN. She has a lot of joint pain and she took a Robaxin of her husbands and it helped.  She is requesting  an Rx for this. I feel this is reasonable given current situation.  eRx sent. Call or send my chart message prn if these symptoms worsen or fail to improve as anticipated. The patient indicates understanding of these issues and agrees with the plan.       Relevant Medications   methocarbamol (ROBAXIN) 500 MG tablet   Cough    Complains of cough and congestion-    She was Dx with bronchitis in October. She was treated with abx and prednisone. She then contracted Covid 19 in 11/20.     She now has a raspy voice, when she moves around she has coughing fits that lasts 1-2 hours in 5-10 minute intervals with a mocha colored sputum. No pain when breathing in lungs, chest, or back. She has occasional wheezing as well. She feels that she needs to be re Tx with abx.  Given duration and progression of symptoms, along with complex factors involved, will treat with levaquin 500 mg x 7 days.  Call or send my chart message prn if these symptoms worsen or fail to improve as anticipated. The patient indicates understanding of these issues and agrees with the plan.           I have discontinued Anaiya Noto. Proch's predniSONE. I have also changed her ALPRAZolam. Additionally, I am having her start on methocarbamol and levofloxacin. Lastly, I am having her maintain her multivitamin, baclofen, Magnesium Citrate, Melatonin, Omega-3 Fatty Acids (FISH OIL PO), Cholecalciferol (VITAMIN D PO), TURMERIC CURCUMIN PO, fexofenadine, cetirizine, OVER THE COUNTER MEDICATION, vitamin E, OVER THE COUNTER MEDICATION, hydrOXYzine, Armour Thyroid,  meclizine, albuterol, budesonide, and fluticasone.  Meds ordered this encounter  Medications  . methocarbamol (ROBAXIN) 500 MG tablet    Sig: Take 1 tablet (500 mg total) by mouth 2 (two) times daily as needed for muscle spasms.    Dispense:  60 tablet    Refill:  1  . ALPRAZolam (XANAX) 0.25 MG tablet    Sig: Take 1 tid prn    Dispense:  90 tablet    Refill:  5    D/C bid/Start tid  . levofloxacin (LEVAQUIN) 500 MG tablet    Sig: Take 1 tablet (500 mg total) by mouth daily.    Dispense:  7 tablet    Refill:  0     [name] Norris, MD

## 2019-09-08 ENCOUNTER — Telehealth: Payer: Self-pay | Admitting: Family Medicine

## 2019-09-08 ENCOUNTER — Telehealth (INDEPENDENT_AMBULATORY_CARE_PROVIDER_SITE_OTHER): Payer: BC Managed Care – PPO | Admitting: Family Medicine

## 2019-09-08 ENCOUNTER — Encounter: Payer: Self-pay | Admitting: Family Medicine

## 2019-09-08 ENCOUNTER — Other Ambulatory Visit: Payer: Self-pay | Admitting: Family Medicine

## 2019-09-08 VITALS — BP 106/76 | Temp 97.6°F

## 2019-09-08 DIAGNOSIS — M797 Fibromyalgia: Secondary | ICD-10-CM | POA: Insufficient documentation

## 2019-09-08 DIAGNOSIS — F419 Anxiety disorder, unspecified: Secondary | ICD-10-CM | POA: Insufficient documentation

## 2019-09-08 DIAGNOSIS — R05 Cough: Secondary | ICD-10-CM

## 2019-09-08 DIAGNOSIS — R059 Cough, unspecified: Secondary | ICD-10-CM

## 2019-09-08 MED ORDER — LEVOFLOXACIN 500 MG PO TABS: 500.0000 mg | ORAL_TABLET | Freq: Every day | ORAL | 0 refills | Status: DC

## 2019-09-08 MED ORDER — ALPRAZOLAM 0.25 MG PO TABS: ORAL_TABLET | ORAL | 5 refills | Status: DC

## 2019-09-08 MED ORDER — METHOCARBAMOL 500 MG PO TABS: 500.0000 mg | ORAL_TABLET | Freq: Two times a day (BID) | ORAL | 1 refills | Status: DC | PRN

## 2019-09-08 NOTE — Assessment & Plan Note (Signed)
Complains of cough and congestion-    She was Dx with bronchitis in October. She was treated with abx and prednisone. She then contracted Covid 19 in 11/20.     She now has a raspy voice, when she moves around she has coughing fits that lasts 1-2 hours in 5-10 minute intervals with a mocha colored sputum. No pain when breathing in lungs, chest, or back. She has occasional wheezing as well. She feels that she needs to be re Tx with abx.  Given duration and progression of symptoms, along with complex factors involved, will treat with levaquin 500 mg x 7 days.  Call or send my chart message prn if these symptoms worsen or fail to improve as anticipated. The patient indicates understanding of these issues and agrees with the plan.

## 2019-09-08 NOTE — Assessment & Plan Note (Signed)
Assessment: Current symptoms: panic attacks, feels shaky. No current suicidal and homicidal ideation. Side effects from treatment: none.   Plan: 1. Medications :Since her father is in the hospital with COVID she has started getting the shakes from an adrenalin dump. She is wondering if there is something that she can take for this. She agrees to try Alprazolam sublingually when she has an episode to try it. Discussed potential risks, expected benefits, possible side effects of the medicine. We also discussed how to take it correctly and dosing instructions.   2. If she has any significant side effects to the medicine, she is to stop it and call for advice. Instructed patient to contact office or on-call physician promptly should condition worsen or any new symptoms appear.

## 2019-09-08 NOTE — Telephone Encounter (Signed)
Patient is calling and stated she went to pick up her medication but Levaquin has not been sent. Pt uses [facility]. CB is [phone].

## 2019-09-08 NOTE — Assessment & Plan Note (Signed)
Deteriorated- Years ago she was Dx with Fibromyalgia by a Dr. Brigitte Pulse who prescribed Ultram for her pain RN. She has a lot of joint pain and she took a Robaxin of her husbands and it helped.  She is requesting an Rx for this. I feel this is reasonable given current situation.  eRx sent. Call or send my chart message prn if these symptoms worsen or fail to improve as anticipated. The patient indicates understanding of these issues and agrees with the plan.

## 2019-09-08 NOTE — Telephone Encounter (Signed)
Patient is calling back regarding Levaquin. Pt states that she has to leave and go out of town. CB is [phone].

## 2019-09-12 ENCOUNTER — Encounter: Payer: Self-pay | Admitting: Family Medicine

## 2019-09-16 ENCOUNTER — Other Ambulatory Visit: Payer: Self-pay | Admitting: Family Medicine

## 2019-09-17 ENCOUNTER — Other Ambulatory Visit: Payer: Self-pay

## 2019-09-17 MED ORDER — GABAPENTIN 300 MG PO CAPS: 300.0000 mg | ORAL_CAPSULE | Freq: Every day | ORAL | 3 refills | Status: DC

## 2019-09-17 NOTE — Progress Notes (Signed)
Subjective:    Patient ID: [name], female    DOB: 1961-04-03, 59 y.o.   MRN: [id number]  Chief Complaint  Patient presents with  . Annual Exam    HPI Patient is in today for CPE without PAP.    Health Maintenance  Topic Date Due  . PAP SMEAR-Modifier  08/25/2020  . MAMMOGRAM  01/26/2021  . COLONOSCOPY  07/14/2023  . TETANUS/TDAP  05/02/2026  . INFLUENZA VACCINE  Completed  . Hepatitis C Screening  Completed  . HIV Screening  Completed     PAP is next due on 12.31.2021.  She is currently fasting. Immunizations are UTD. She is due for a Mammogram and will [facility] to schedule if orders are placed.   Her father just passed from [place] so she is having a hard time right now  She has started taking the Gabapentin. She states that she can tell it is helping but she is going to take it an hour before bedtime to see if that helps with the morning grogginess that follows.   Labs are UTD as they were completed in October.  Health Maintenance  Topic Date Due  . PAP SMEAR-Modifier  08/25/2020  . MAMMOGRAM  01/26/2021  . COLONOSCOPY  07/14/2023  . TETANUS/TDAP  05/02/2026  . INFLUENZA VACCINE  Completed  . Hepatitis C Screening  Completed  . HIV Screening  Completed     Past Medical History:  Diagnosis Date  . Allergic rhinitis   . Cervical dysplasia 1990   s/p conization  . Chronic sinusitis   . Depression   . Fibromyalgia   . Hip fracture, left ([place]) 2005   s/p ORIF  . Hypothyroidism   . Migraine     Past Surgical History:  Procedure Laterality Date  . BREAST ENHANCEMENT SURGERY  2007  . BREAST IMPLANT REMOVAL Bilateral 05/21/2018  . DILATION AND CURETTAGE OF UTERUS  1989  . ORIF HIP FRACTURE  2005   [place]  . TONSILLECTOMY AND ADENOIDECTOMY      Family History  Problem Relation Age of Onset  . Arthritis Mother        RA and OA  . Cancer Maternal Aunt 48       breast CA  . Colon cancer Neg Hx   . Pancreatic cancer Neg Hx   .  Stomach cancer Neg Hx     Social History   Socioeconomic History  . Marital status: Married    Spouse name: Not on file  . Number of children: Not on file  . Years of education: Not on file  . Highest education level: Not on file  Occupational History  . Not on file  Tobacco Use  . Smoking status: Never Smoker  . Smokeless tobacco: Never Used  Substance and Sexual Activity  . Alcohol use: Yes    Comment: rarely  . Drug use: No  . Sexual activity: Not on file  Other Topics Concern  . 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   Social Determinants of Health   Financial Resource Strain:   . Difficulty of Paying Living Expenses: Not on file  Food Insecurity:   . Worried About Charity fundraiser in the Last Year: Not on file  . Ran Out of Food in the Last Year: Not on file  Transportation Needs:   . Lack of Transportation (Medical): Not on file  .  Lack of Transportation (Non-Medical): Not on file  Physical Activity:   . Days of Exercise per Week: Not on file  . Minutes of Exercise per Session: Not on file  Stress:   . Feeling of Stress : Not on file  Social Connections:   . Frequency of Communication with Friends and Family: Not on file  . Frequency of Social Gatherings with Friends and Family: Not on file  . Attends Religious Services: Not on file  . Active Member of Clubs or Organizations: Not on file  . Attends Archivist Meetings: Not on file  . Marital Status: Not on file  Intimate Partner Violence:   . Fear of Current or Ex-Partner: Not on file  . Emotionally Abused: Not on file  . Physically Abused: Not on file  . Sexually Abused: Not on file    Outpatient Medications Prior to Visit  Medication Sig Dispense Refill  . albuterol (VENTOLIN HFA) 108 (90 Base) MCG/ACT inhaler Inhale 2 puffs into the lungs every 4 (four) hours as needed for wheezing or shortness of breath. 18 g 0  . ALPRAZolam  (XANAX) 0.25 MG tablet Take 1 tid prn 90 tablet 5  . ARMOUR THYROID 60 MG tablet TAKE 1 TABLET BY MOUTH DAILY BEFORE BREAKFAST 90 tablet 3  . baclofen (LIORESAL) 10 MG tablet as needed.     . budesonide (RHINOCORT ALLERGY) 32 MCG/ACT nasal spray Place 1 spray into both nostrils daily. 8.6 g 0  . cetirizine (ZYRTEC) 10 MG tablet Take 10 mg by mouth at bedtime.    . Cholecalciferol (VITAMIN D PO) Take by mouth daily.    . fexofenadine (ALLEGRA) 60 MG tablet Take 60 mg by mouth daily.    . fluticasone (FLONASE) 50 MCG/ACT nasal spray USE 2 SPRAYS INTO EACH NOSTRIL DAILY 16 g 11  . gabapentin (NEURONTIN) 300 MG capsule Take 1 capsule (300 mg total) by mouth at bedtime. 30 capsule 3  . GAMMA AMINOBUTYRIC ACID PO Take 750 mg by mouth daily.    . hydrOXYzine (ATARAX/VISTARIL) 50 MG tablet Take 1 tablet (50 mg total) by mouth every 6 (six) hours as needed. 30 tablet 0  . Magnesium Citrate 100 MG TABS Take 200 mg by mouth daily.    . meclizine (ANTIVERT) 25 MG tablet Take 1 tablet (25 mg total) by mouth 3 (three) times daily as needed for dizziness. 30 tablet 0  . Melatonin 10 MG TABS Take 10 mg by mouth at bedtime.    . methocarbamol (ROBAXIN) 500 MG tablet Take 1 tablet (500 mg total) by mouth 2 (two) times daily as needed for muscle spasms. 60 tablet 1  . Multiple Vitamin (MULTIVITAMIN) capsule Take 1 capsule by mouth daily.      . Omega-3 Fatty Acids (FISH OIL PO) Take by mouth daily.    Marland Kitchen OVER THE COUNTER MEDICATION CBD Oil    . OVER THE COUNTER MEDICATION Seleium 200 mg    . TURMERIC CURCUMIN PO Take 1 tablet by mouth daily.    . vitamin E 400 UNIT capsule Take 400 Units by mouth daily.    . SUMAtriptan (IMITREX) 50 MG tablet Take 50 mg by mouth every 2 (two) hours as needed for migraine. May repeat in 2 hours if headache persists or recurs.    Marland Kitchen levofloxacin (LEVAQUIN) 500 MG tablet Take 1 tablet (500 mg total) by mouth daily. 7 tablet 0   No facility-administered medications prior to visit.     No Known Allergies  Review of Systems  Constitutional: Negative for fever and malaise/fatigue.  HENT: Negative for congestion and hearing loss.   Eyes: Negative for blurred vision, discharge and redness.  Respiratory: Negative for cough and shortness of breath.   Cardiovascular: Negative for chest pain, palpitations and leg swelling.  Gastrointestinal: Negative for abdominal pain and heartburn.  Genitourinary: Negative for dysuria.  Musculoskeletal: Negative for falls.  Skin: Negative for rash.  Neurological: Negative for loss of consciousness and headaches.  Endo/Heme/Allergies: Does not bruise/bleed easily.  Psychiatric/Behavioral: Negative for depression and memory loss.       Objective:    Physical Exam Vitals and nursing note reviewed.  Constitutional:      Appearance: Normal appearance. She is not ill-appearing.  HENT:     Head: Normocephalic and atraumatic.     Right Ear: External ear normal.     Left Ear: External ear normal.     Nose: Nose normal.  Eyes:     General:        Right eye: No discharge.        Left eye: No discharge.  Cardiovascular:     Rate and Rhythm: Normal rate and regular rhythm.     Heart sounds: Normal heart sounds.  Pulmonary:     Effort: Pulmonary effort is normal.     Breath sounds: No wheezing.  Chest:     Breasts: Breasts are symmetrical.        Right: Normal. No swelling, bleeding, inverted nipple, mass, nipple discharge, skin change or tenderness.        Left: Normal. No swelling, bleeding, inverted nipple, mass, nipple discharge, skin change or tenderness.  Abdominal:     Palpations: Abdomen is soft. There is no mass.     Tenderness: There is no abdominal tenderness. There is no guarding.  Musculoskeletal:        General: Normal range of motion.     Right lower leg: No edema.     Left lower leg: No edema.  Lymphadenopathy:     Upper Body:     Right upper body: No axillary adenopathy.     Left upper body: No axillary  adenopathy.  Skin:    General: Skin is dry.  Neurological:     Mental Status: She is alert and oriented to person, place, and time.     Deep Tendon Reflexes: Reflexes normal.  Psychiatric:        Mood and Affect: Mood normal.        Behavior: Behavior normal.     BP 106/64 (BP Location: Left Arm, Patient Position: Sitting, Cuff Size: Normal)   Pulse 70   Temp (!) 96.3 F (35.7 C) (Temporal)   Ht 5' 9.75" (1.772 m)   Wt 167 lb 6.4 oz (75.9 kg)   LMP 06/29/2019   SpO2 99%   BMI 24.19 kg/m  Wt Readings from Last 3 Encounters:  09/20/19 167 lb 6.4 oz (75.9 kg)  06/14/19 167 lb (75.8 kg)  10/21/18 148 lb (67.1 kg)     Lab Results  Component Value Date   WBC 5.2 06/14/2019   HGB 12.6 06/14/2019   HCT 38.0 06/14/2019   PLT 261.0 06/14/2019   GLUCOSE 89 09/14/2018   CHOL 157 09/14/2018   TRIG 60.0 09/14/2018   HDL 71.10 09/14/2018   LDLCALC 74 09/14/2018   ALT 12 09/14/2018   AST 17 09/14/2018   NA 139 09/14/2018   K 3.9 09/14/2018   CL 105 09/14/2018   CREATININE  0.94 09/14/2018   BUN 21 09/14/2018   CO2 28 09/14/2018   TSH 1.33 06/14/2019   INR 1.1 (H) 03/03/2018   HGBA1C 5.8 06/14/2019    Lab Results  Component Value Date   TSH 1.33 06/14/2019   Lab Results  Component Value Date   WBC 5.2 06/14/2019   HGB 12.6 06/14/2019   HCT 38.0 06/14/2019   MCV 94.6 06/14/2019   PLT 261.0 06/14/2019   Lab Results  Component Value Date   NA 139 09/14/2018   K 3.9 09/14/2018   CO2 28 09/14/2018   GLUCOSE 89 09/14/2018   BUN 21 09/14/2018   CREATININE 0.94 09/14/2018   BILITOT 0.6 09/14/2018   ALKPHOS 58 09/14/2018   AST 17 09/14/2018   ALT 12 09/14/2018   PROT 6.8 09/14/2018   ALBUMIN 4.3 09/14/2018   CALCIUM 9.6 09/14/2018   GFR 61.66 09/14/2018   Lab Results  Component Value Date   CHOL 157 09/14/2018   Lab Results  Component Value Date   HDL 71.10 09/14/2018   Lab Results  Component Value Date   LDLCALC 74 09/14/2018   Lab Results    Component Value Date   TRIG 60.0 09/14/2018   Lab Results  Component Value Date   CHOLHDL 2 09/14/2018   Lab Results  Component Value Date   HGBA1C 5.8 06/14/2019       Assessment & Plan:   Problem List Items Addressed This Visit      Active Problems   Depression   Hypothyroidism due to Hashimoto's thyroiditis    History: Taking armour 60 mg daily. Current symptoms: change in energy level. Patient denies diarrhea and heat / cold intolerance. Symptoms have been well-controlled. Lab Results  Component Value Date   TSH 1.33 06/14/2019   TSH 1.17 09/14/2018   TSH 0.50 02/11/2018   Lab Results  Component Value Date   FREET4 0.62 06/14/2019   FREET4 0.76 09/14/2018   FREET4 0.85 02/11/2018    Assessment/Plan:  Instructed not to take MVM or iron within 4 hours of taking thyroid medications.  Common Reasons for Abnormal TSH Levels on a Previously Stable Dosage of Thyroid Hormone . Patient nonadherent to thyroid hormone regimen (missing doses) . Decreased absorption of thyroid hormone . Patient is now taking thyroid hormone with food . Patient takes thyroid hormone within four hours of calcium, iron, soy products, or aluminum-containing antacids . Patient is prescribed medication that decreases absorption of thyroid hormone, such as cholestyramine (Questran), colestipol (Colestid), orlistat (Xenical), or sucralfate (Carafate) . Patient is now pregnant or recently started or stopped estrogen-containing oral contraceptive or hormone therapy . Generic substitution for brand name or vice versa, or substitution of one generic formulation for another . Patient started on sertraline (Zoloft), another selective serotonin reuptake inhibitor, or a tricyclic antidepressant . Patient started on carbamazepine (Tegretol) or phenytoin (Dilantin)          Peripheral neuropathy    Improved with Gabapentin.        Anxiety   Well woman exam without gynecological exam    Reviewed  preventive care protocols, scheduled due services, and updated immunizations Discussed nutrition, exercise, diet, and healthy lifestyle.  Mammogram ordered and phone number for breast center given to pt to schedule her own mammogram.       Grief    Appropriate- she feels he is a better place and they had a very good relationship so feel she has no regrets. She does not feel she needs grief  counseling at this time.       Other Visit Diagnoses    Encounter for breast cancer screening using non-mammogram modality    -  Primary   Relevant Orders   MM Digital Screening      I have discontinued Daven H. Sabella's levofloxacin. I have also changed her SUMAtriptan. Additionally, I am having her maintain her multivitamin, baclofen, Magnesium Citrate, Melatonin, Omega-3 Fatty Acids (FISH OIL PO), Cholecalciferol (VITAMIN D PO), TURMERIC CURCUMIN PO, fexofenadine, cetirizine, OVER THE COUNTER MEDICATION, vitamin E, OVER THE COUNTER MEDICATION, hydrOXYzine, meclizine, albuterol, budesonide, fluticasone, methocarbamol, ALPRAZolam, Armour Thyroid, gabapentin, and GAMMA AMINOBUTYRIC ACID PO.  Meds ordered this encounter  Medications  . SUMAtriptan (IMITREX) 50 MG tablet    Sig: Take 1 tablet (50 mg total) by mouth every 2 (two) hours as needed for migraine. May repeat in 2 hours if headache persists or recurs.    Dispense:  10 tablet    Refill:  5    This visit occurred during the SARS-CoV-2 public health emergency.  Safety protocols were in place, including screening questions prior to the visit, additional usage of staff PPE, and extensive cleaning of exam room while observing appropriate contact time as indicated for disinfecting solutions.     Arnette Norris, MD

## 2019-09-19 DIAGNOSIS — Z0000 Encounter for general adult medical examination without abnormal findings: Secondary | ICD-10-CM | POA: Insufficient documentation

## 2019-09-19 DIAGNOSIS — Z0001 Encounter for general adult medical examination with abnormal findings: Secondary | ICD-10-CM | POA: Insufficient documentation

## 2019-09-19 NOTE — Assessment & Plan Note (Signed)
History: Taking armour 60 mg daily. Current symptoms: change in energy level. Patient denies diarrhea and heat / cold intolerance. Symptoms have been well-controlled. Lab Results  Component Value Date   TSH 1.33 06/14/2019   TSH 1.17 09/14/2018   TSH 0.50 02/11/2018   Lab Results  Component Value Date   FREET4 0.62 06/14/2019   FREET4 0.76 09/14/2018   FREET4 0.85 02/11/2018    Assessment/Plan:  Instructed not to take MVM or iron within 4 hours of taking thyroid medications.  Common Reasons for Abnormal TSH Levels on a Previously Stable Dosage of Thyroid Hormone . Patient nonadherent to thyroid hormone regimen (missing doses) . Decreased absorption of thyroid hormone . Patient is now taking thyroid hormone with food . Patient takes thyroid hormone within four hours of calcium, iron, soy products, or aluminum-containing antacids . Patient is prescribed medication that decreases absorption of thyroid hormone, such as cholestyramine (Questran), colestipol (Colestid), orlistat (Xenical), or sucralfate (Carafate) . Patient is now pregnant or recently started or stopped estrogen-containing oral contraceptive or hormone therapy . Generic substitution for brand name or vice versa, or substitution of one generic formulation for another . Patient started on sertraline (Zoloft), another selective serotonin reuptake inhibitor, or a tricyclic antidepressant . Patient started on carbamazepine (Tegretol) or phenytoin (Dilantin)

## 2019-09-19 NOTE — Assessment & Plan Note (Addendum)
Reviewed preventive care protocols, scheduled due services, and updated immunizations Discussed nutrition, exercise, diet, and healthy lifestyle.  Mammogram ordered and phone number for breast center given to pt to schedule her own mammogram. 

## 2019-09-20 ENCOUNTER — Ambulatory Visit (INDEPENDENT_AMBULATORY_CARE_PROVIDER_SITE_OTHER): Payer: BC Managed Care – PPO | Admitting: Family Medicine

## 2019-09-20 ENCOUNTER — Encounter: Payer: Self-pay | Admitting: Family Medicine

## 2019-09-20 ENCOUNTER — Other Ambulatory Visit: Payer: Self-pay

## 2019-09-20 VITALS — BP 106/64 | HR 70 | Temp 96.3°F | Ht 69.75 in | Wt 167.4 lb

## 2019-09-20 DIAGNOSIS — Z0000 Encounter for general adult medical examination without abnormal findings: Secondary | ICD-10-CM

## 2019-09-20 DIAGNOSIS — Z1239 Encounter for other screening for malignant neoplasm of breast: Secondary | ICD-10-CM | POA: Diagnosis not present

## 2019-09-20 DIAGNOSIS — F329 Major depressive disorder, single episode, unspecified: Secondary | ICD-10-CM

## 2019-09-20 DIAGNOSIS — G6289 Other specified polyneuropathies: Secondary | ICD-10-CM

## 2019-09-20 DIAGNOSIS — F419 Anxiety disorder, unspecified: Secondary | ICD-10-CM

## 2019-09-20 DIAGNOSIS — E063 Autoimmune thyroiditis: Secondary | ICD-10-CM

## 2019-09-20 DIAGNOSIS — F32A Depression, unspecified: Secondary | ICD-10-CM

## 2019-09-20 DIAGNOSIS — F4321 Adjustment disorder with depressed mood: Secondary | ICD-10-CM | POA: Insufficient documentation

## 2019-09-20 DIAGNOSIS — E038 Other specified hypothyroidism: Secondary | ICD-10-CM | POA: Diagnosis not present

## 2019-09-20 MED ORDER — SUMATRIPTAN SUCCINATE 50 MG PO TABS: 50.0000 mg | ORAL_TABLET | ORAL | 5 refills | Status: DC | PRN

## 2019-09-20 NOTE — Assessment & Plan Note (Signed)
Appropriate- she feels he is a better place and they had a very good relationship so feel she has no regrets. She does not feel she needs grief counseling at this time.

## 2019-09-20 NOTE — Patient Instructions (Signed)
Great to see you. I am so sorry for your loss.  Please call the breast center at [phone] to schedule your mammogram.

## 2019-09-20 NOTE — Assessment & Plan Note (Signed)
Improved with Gabapentin. 

## 2019-09-28 ENCOUNTER — Encounter: Payer: Self-pay | Admitting: Family Medicine

## 2019-09-29 NOTE — Telephone Encounter (Signed)
Please see message and advise.  Thank you. ° °

## 2019-09-29 NOTE — Addendum Note (Signed)
Addended by: Lucille Passy on: 09/29/2019 12:22 PM   Modules accepted: Orders

## 2019-10-18 ENCOUNTER — Ambulatory Visit: Payer: BC Managed Care – PPO | Admitting: Nurse Practitioner

## 2019-10-18 ENCOUNTER — Ambulatory Visit (INDEPENDENT_AMBULATORY_CARE_PROVIDER_SITE_OTHER): Payer: BC Managed Care – PPO | Admitting: Family Medicine

## 2019-10-18 ENCOUNTER — Other Ambulatory Visit: Payer: Self-pay

## 2019-10-18 ENCOUNTER — Encounter: Payer: Self-pay | Admitting: Family Medicine

## 2019-10-18 VITALS — BP 118/78 | HR 86 | Temp 97.6°F | Ht 69.75 in | Wt 169.0 lb

## 2019-10-18 DIAGNOSIS — R053 Chronic cough: Secondary | ICD-10-CM

## 2019-10-18 DIAGNOSIS — R05 Cough: Secondary | ICD-10-CM | POA: Diagnosis not present

## 2019-10-18 NOTE — Assessment & Plan Note (Signed)
Etiology unclear. Some improvement with prilosec - so continue treatment for possible GERD. Also with allergic hx and wheezing on exam following viral infection. Discussed could be reactive airway and recommended trying albuterol to see if that stops the coughing as well as saline rinse and flonase to help reduce the drainage she notices. Offered CXR today for covid f/u - discussed that coughs can linger for a few months following covid infection and this is possible if no improvement with PPI. Pt elected to wait for f/u appointment as some symptom improvement on PPI. Return in 6 weeks

## 2019-10-18 NOTE — Progress Notes (Signed)
Subjective:     [name] is a 59 y.o. female presenting for Coughing in AM (Had bronchitis and Covid late last year. Since then, she wakes up with a cough for about an hour. Self research suggested GERD. Started Prilosec OTC 5 days ago.) and Depression (Stopped gabapentin because it was not working for her neuropathy. Stopped takingit becasue it was not working. Someone suggested Cymbalta for her depression/grief and neuropathy.)     HPI   #Cough - recurring morning cough - for "awhile" - bronchitis October - Covid November 2020 - does not typically get bronchitis - has been having recurring cough - will have a coughing fit for 30-45 minutes - googled last week and saw information for GERD - once she gets through the coughing fit she feels better the rest of the day - denies heartburn or indigestion  - feels like she has been having a significant amount of drainage - prone to sinus infection - throat has been irriated, hoarse, sore after coughing fits - yesterday spit up a table spoon of mucus - no blood  - endorses intermittent wheezing - brought it up at her physical and told lungs sounded good - not sure if it is things in her throat - wheeze w/ deep breathing - feels this has started only after Covid - cough is occasionally so bad it triggers the gag reflex - started prilosec 5 days ago - taking first thing in the morning  Has had abnormal bowel movements since her mom passed   Due to time, deferred other discussion to future appointment  Review of Systems  HENT: Positive for postnasal drip.   Respiratory: Positive for cough.   Gastrointestinal: Negative for abdominal pain, anal bleeding, blood in stool, constipation, diarrhea, nausea and vomiting.       Heartburn     Social History   Tobacco Use  Smoking Status Never Smoker  Smokeless Tobacco Never Used        Objective:    BP Readings from Last 3 Encounters:  10/18/19 118/78  09/20/19 106/64   09/08/19 106/76   Wt Readings from Last 3 Encounters:  10/18/19 169 lb (76.7 kg)  09/20/19 167 lb 6.4 oz (75.9 kg)  06/14/19 167 lb (75.8 kg)    BP 118/78 (BP Location: Left Arm, Patient Position: Sitting, Cuff Size: Normal)   Pulse 86   Temp 97.6 F (36.4 C)   Ht 5' 9.75" (1.772 m)   Wt 169 lb (76.7 kg)   SpO2 97%   BMI 24.42 kg/m    Physical Exam Constitutional:      General: She is not in acute distress.    Appearance: She is well-developed. She is not diaphoretic.  HENT:     Right Ear: External ear normal.     Left Ear: External ear normal.     Nose: Nose normal.     Mouth/Throat:     Mouth: Mucous membranes are moist.     Pharynx: Oropharynx is clear. No posterior oropharyngeal erythema.  Eyes:     Conjunctiva/sclera: Conjunctivae normal.  Cardiovascular:     Rate and Rhythm: Normal rate and regular rhythm.     Heart sounds: No murmur.  Pulmonary:     Effort: Pulmonary effort is normal.     Breath sounds: Wheezing (scattered) present. No rhonchi.  Abdominal:     General: Abdomen is flat. There is no distension.     Tenderness: There is no abdominal tenderness. There is no guarding  or rebound.  Musculoskeletal:     Cervical back: Neck supple.  Skin:    General: Skin is warm and dry.     Capillary Refill: Capillary refill takes less than 2 seconds.  Neurological:     Mental Status: She is alert. Mental status is at baseline.  Psychiatric:        Mood and Affect: Mood normal.        Behavior: Behavior normal.           Assessment & Plan:   Problem List Items Addressed This Visit      Other   Chronic cough - Primary    Etiology unclear. Some improvement with prilosec - so continue treatment for possible GERD. Also with allergic hx and wheezing on exam following viral infection. Discussed could be reactive airway and recommended trying albuterol to see if that stops the coughing as well as saline rinse and flonase to help reduce the drainage she  notices. Offered CXR today for covid f/u - discussed that coughs can linger for a few months following covid infection and this is possible if no improvement with PPI. Pt elected to wait for f/u appointment as some symptom improvement on PPI. Return in 6 weeks          Return in about 6 weeks (around 11/29/2019).  Lesleigh Noe, MD

## 2019-10-18 NOTE — Patient Instructions (Signed)
Cough fits 1) Prilosec for 2 weeks -- if improvement, continue for 8 weeks 2) If no complete recovery or immediate return of symptoms - then generally will have GI referral  You have wheezes on your lung exam  1) Could also try Albuterol during coughing fits to see if that helps your symptoms  Return for follow-up appointment   For Drainage - use flonase - consider neti pot at night and in the morning

## 2019-10-20 ENCOUNTER — Encounter: Payer: Self-pay | Admitting: Family Medicine

## 2019-10-20 DIAGNOSIS — R05 Cough: Secondary | ICD-10-CM

## 2019-10-20 DIAGNOSIS — K219 Gastro-esophageal reflux disease without esophagitis: Secondary | ICD-10-CM

## 2019-10-20 DIAGNOSIS — R053 Chronic cough: Secondary | ICD-10-CM

## 2019-10-20 MED ORDER — OMEPRAZOLE 20 MG PO CPDR: 20.0000 mg | DELAYED_RELEASE_CAPSULE | Freq: Every day | ORAL | 0 refills | Status: DC

## 2019-10-25 ENCOUNTER — Encounter: Payer: Self-pay | Admitting: Family Medicine

## 2019-10-25 ENCOUNTER — Other Ambulatory Visit: Payer: Self-pay

## 2019-10-25 ENCOUNTER — Ambulatory Visit: Payer: BC Managed Care – PPO | Admitting: Family Medicine

## 2019-10-25 VITALS — BP 96/66 | HR 69 | Temp 97.6°F | Ht 69.75 in | Wt 172.5 lb

## 2019-10-25 DIAGNOSIS — F411 Generalized anxiety disorder: Secondary | ICD-10-CM

## 2019-10-25 DIAGNOSIS — F4321 Adjustment disorder with depressed mood: Secondary | ICD-10-CM

## 2019-10-25 DIAGNOSIS — E1169 Type 2 diabetes mellitus with other specified complication: Secondary | ICD-10-CM | POA: Insufficient documentation

## 2019-10-25 DIAGNOSIS — G6289 Other specified polyneuropathies: Secondary | ICD-10-CM

## 2019-10-25 DIAGNOSIS — F329 Major depressive disorder, single episode, unspecified: Secondary | ICD-10-CM | POA: Diagnosis not present

## 2019-10-25 DIAGNOSIS — G4700 Insomnia, unspecified: Secondary | ICD-10-CM

## 2019-10-25 DIAGNOSIS — R7303 Prediabetes: Secondary | ICD-10-CM

## 2019-10-25 LAB — COMPREHENSIVE METABOLIC PANEL
ALT: 14 U/L (ref 0–35)
AST: 20 U/L (ref 0–37)
Albumin: 4 g/dL (ref 3.5–5.2)
Alkaline Phosphatase: 59 U/L (ref 39–117)
BUN: 22 mg/dL (ref 6–23)
CO2: 30 mEq/L (ref 19–32)
Calcium: 9.6 mg/dL (ref 8.4–10.5)
Chloride: 103 mEq/L (ref 96–112)
Creatinine, Ser: 0.9 mg/dL (ref 0.40–1.20)
GFR: 64.57 mL/min (ref >60.00)
Glucose, Bld: 92 mg/dL (ref 70–99)
Potassium: 4.4 mEq/L (ref 3.5–5.1)
Sodium: 137 mEq/L (ref 135–145)
Total Bilirubin: 0.5 mg/dL (ref 0.2–1.2)
Total Protein: 7.4 g/dL (ref 6.0–8.3)

## 2019-10-25 LAB — HEMOGLOBIN A1C: Hgb A1c MFr Bld: 5.6 % (ref 4.6–6.5)

## 2019-10-25 MED ORDER — DULOXETINE HCL 30 MG PO CPEP: ORAL_CAPSULE | ORAL | 3 refills | Status: DC

## 2019-10-25 NOTE — Assessment & Plan Note (Signed)
Has been taking xanax daily 1-2 pills for insomnia. Discussed that this is a medication that creates dependency. Hopefully cymbalta will improve anxiety symptoms and decrease additional prn use. Will continue to discuss and see if alternative options may be safer alternatives.

## 2019-10-25 NOTE — Progress Notes (Signed)
Subjective:     [name] is a 59 y.o. female presenting for Discuss neuropathy (in her hands and feet. gabapentin did not help after 6 weeks.)     HPI   #Neuropathy - was on gabapentin - spoke with the pharmacist about cymbalta which may also help - recently lost her dad and her mom within the last year with covid and sudden loss of her father - did get better after her mom - in a grief share with her dad  - not interested in antidepressant - was on gabapentin x 6 weeks - only on 300 mg of gabapentin daily at night - had some side effects - HA - initially which resolved - neuropathy worse at night - started a while ago - hx of chronic migraines was on topimax (numbness/tingling side effect)  - was healed from chronic migraines and stopped topimax - numbness of face resolved - hand numbness and tingling resolved - work-up: hx of prior nerve study but no more recent work-up  #GAD - taking xanax from when she had chronic migraines - stopped everything when migraines resolved - taking BID - has been a bad sleeper - initially was managing anxiety well, but this has been worse - will get occasional daytime  - insomnia - issues with staying asleep - would take it in the middle of the night w/ occasional  Review of Systems  10/18/19: Clinic - unable to discuss neuropathy - Stopped gabapentin.   Social History   Tobacco Use  Smoking Status Never Smoker  Smokeless Tobacco Never Used        Objective:    BP Readings from Last 3 Encounters:  10/25/19 96/66  10/18/19 118/78  09/20/19 106/64   Wt Readings from Last 3 Encounters:  10/25/19 172 lb 8 oz (78.2 kg)  10/18/19 169 lb (76.7 kg)  09/20/19 167 lb 6.4 oz (75.9 kg)    BP 96/66   Pulse 69   Temp 97.6 F (36.4 C)   Ht 5' 9.75" (1.772 m)   Wt 172 lb 8 oz (78.2 kg)   LMP 06/29/2019   SpO2 99%   BMI 24.93 kg/m    Physical Exam Constitutional:      General: She is not in acute distress.  Appearance: She is well-developed. She is not diaphoretic.  HENT:     Right Ear: External ear normal.     Left Ear: External ear normal.     Nose: Nose normal.  Eyes:     Conjunctiva/sclera: Conjunctivae normal.  Cardiovascular:     Rate and Rhythm: Normal rate.  Pulmonary:     Effort: Pulmonary effort is normal.  Musculoskeletal:     Cervical back: Neck supple.  Skin:    General: Skin is warm and dry.     Capillary Refill: Capillary refill takes less than 2 seconds.  Neurological:     Mental Status: She is alert. Mental status is at baseline.  Psychiatric:        Attention and Perception: Attention normal.        Mood and Affect: Mood is anxious.        Behavior: Behavior normal.      Depression screen [facility] 2/9 10/18/2019 06/14/2019 09/14/2018  Decreased Interest 1 0 0  Down, Depressed, Hopeless 2 0 0  PHQ - 2 Score 3 0 0  Altered sleeping 2 0 -  Tired, decreased energy 3 0 -  Change in appetite 2 0 -  Feeling bad  or failure about yourself  0 0 -  Trouble concentrating 2 0 -  Moving slowly or fidgety/restless 0 0 -  Suicidal thoughts 0 0 -  PHQ-9 Score 12 0 -  Difficult doing work/chores Somewhat difficult Not difficult at all -   The 10-year ASCVD risk score Mikey Bussing DC Brooke Bonito., et al., 2013) is: 0.8%   Values used to calculate the score:     Age: 38 years     Sex: Female     Is Non-Hispanic African American: No     Diabetic: No     Tobacco smoker: No     Systolic Blood Pressure: 96 mmHg     Is BP treated: No     HDL Cholesterol: 71.1 mg/dL     Total Cholesterol: 157 mg/dL      Assessment & Plan:   Problem List Items Addressed This Visit      Nervous and Auditory   Peripheral neuropathy - Primary    Trial of cymbalta 30 mg, increase to 60 mg as tolerated. Return in 5 weeks      Relevant Medications   DULoxetine (CYMBALTA) 30 MG capsule   Other Relevant Orders   Comprehensive metabolic panel     Other   Depression    In setting of grief reaction. Cymbalta  to see if improvement and she is starting a grief support group.       Relevant Medications   DULoxetine (CYMBALTA) 30 MG capsule   Insomnia    Has been taking xanax daily 1-2 pills for insomnia. Discussed that this is a medication that creates dependency. Hopefully cymbalta will improve anxiety symptoms and decrease additional prn use. Will continue to discuss and see if alternative options may be safer alternatives.       Generalized anxiety disorder    Starting cymbalta today for neuropathy and to see if it makes some improvement in mood. Prn xanax discussed if taking regularly would want to work towards options to decrease regular use.       Relevant Medications   DULoxetine (CYMBALTA) 30 MG capsule   Grief   Relevant Medications   DULoxetine (CYMBALTA) 30 MG capsule   Prediabetes    Reviewed labs and informed of diagnosis. As neuropathy worsening will repeat. Encouraged regular exercise and monitoring annually.       Relevant Orders   Hemoglobin A1c       Return in about 5 weeks (around 11/29/2019).  Lesleigh Noe, MD

## 2019-10-25 NOTE — Patient Instructions (Signed)
You are going to start a new antidepressant medication for your nerve pain.   One of the risks of this medication is increase in suicidal thoughts.   Your suicide Action plan is as follows:  1) Call friends or family 2) Call the Suicide Hotline [phone] which is available 24 hours 3) Call the Clinic    The most common side effect is stomach upset. If this happens it means the medication is working. It should get better in 1-3 weeks.    How to help anxiety and depression  1) Regular Exercise - walking, jogging, cycling, dancing, strength training - aiming for 150 minutes of exercise a week --> Yoga has been shown in research to reduce depression and anxiety -- with even just one hour long session per week  2)  Begin a Mindfulness/Meditation practice -- this can take a little as 3 minutes and is helpful for all kinds of mood issues -- You can find resources in books -- Or you can download apps like  ---- Headspace App  ---- Calm  ---- Insignt Timer ---- Stop, Breathe & Think  # With each of these Apps - you should decline the "start free trial" offer and as you search through the App should be able to access some of their free content. You can also chose to pay for the content if you find one that works well for you.   # Many of them also offer sleep specific content which may help with insomnia

## 2019-10-25 NOTE — Assessment & Plan Note (Signed)
Trial of cymbalta 30 mg, increase to 60 mg as tolerated. Return in 5 weeks

## 2019-10-25 NOTE — Assessment & Plan Note (Signed)
In setting of grief reaction. Cymbalta to see if improvement and she is starting a grief support group.

## 2019-10-25 NOTE — Assessment & Plan Note (Signed)
Reviewed labs and informed of diagnosis. As neuropathy worsening will repeat. Encouraged regular exercise and monitoring annually.

## 2019-10-25 NOTE — Assessment & Plan Note (Signed)
Starting cymbalta today for neuropathy and to see if it makes some improvement in mood. Prn xanax discussed if taking regularly would want to work towards options to decrease regular use.

## 2019-11-20 DIAGNOSIS — M79641 Pain in right hand: Secondary | ICD-10-CM | POA: Diagnosis not present

## 2019-11-20 DIAGNOSIS — M1711 Unilateral primary osteoarthritis, right knee: Secondary | ICD-10-CM | POA: Diagnosis not present

## 2019-11-20 DIAGNOSIS — M79642 Pain in left hand: Secondary | ICD-10-CM | POA: Diagnosis not present

## 2019-11-20 DIAGNOSIS — M1712 Unilateral primary osteoarthritis, left knee: Secondary | ICD-10-CM | POA: Diagnosis not present

## 2019-11-20 DIAGNOSIS — M7062 Trochanteric bursitis, left hip: Secondary | ICD-10-CM | POA: Diagnosis not present

## 2019-11-29 ENCOUNTER — Encounter: Payer: Self-pay | Admitting: Family Medicine

## 2019-11-29 ENCOUNTER — Other Ambulatory Visit: Payer: Self-pay

## 2019-11-29 ENCOUNTER — Ambulatory Visit: Payer: BC Managed Care – PPO | Admitting: Family Medicine

## 2019-11-29 VITALS — BP 104/78 | HR 80 | Temp 97.1°F | Ht 69.75 in | Wt 175.0 lb

## 2019-11-29 DIAGNOSIS — F329 Major depressive disorder, single episode, unspecified: Secondary | ICD-10-CM

## 2019-11-29 DIAGNOSIS — F4321 Adjustment disorder with depressed mood: Secondary | ICD-10-CM

## 2019-11-29 DIAGNOSIS — Z780 Asymptomatic menopausal state: Secondary | ICD-10-CM | POA: Insufficient documentation

## 2019-11-29 DIAGNOSIS — G4700 Insomnia, unspecified: Secondary | ICD-10-CM | POA: Diagnosis not present

## 2019-11-29 DIAGNOSIS — G6289 Other specified polyneuropathies: Secondary | ICD-10-CM | POA: Diagnosis not present

## 2019-11-29 MED ORDER — DULOXETINE HCL 60 MG PO CPEP: 60.0000 mg | ORAL_CAPSULE | Freq: Every day | ORAL | 3 refills | Status: DC

## 2019-11-29 NOTE — Assessment & Plan Note (Signed)
Improved on cymbalta and with grief therapy group.

## 2019-11-29 NOTE — Progress Notes (Signed)
Subjective:     [name] is a 59 y.o. female presenting for Transfer of Care (Former Dr Deborra Medina pt), Gastroesophageal Reflux (Has gotten better on the omeprazole), Peripheral Neuropathy (Sees an improvement with Cymbalta.), Left Ear Stopped Up (Has always had an issue with her ear. Had tube placed several years ago. Intermittent now.), Knee Pain (Saw ortho and they told her she has arthritis.), Dizziness (Still an intermittent issue), Autoimmune Questions, Sleeping Problem, and Menopausal Symptoms     HPI  #GERD - improved on omeprazole - continue for 8 weeks  #neuropathy - cymbalta helping - not completely gone - did increase to 60 mg - hands and feet have improved  #Menopausal symptoms - last period was November 2020 - having night-sweats over the last few weeks - also having insomnia - noticed some irritability - has consider hormones - has always been a light sleeper  #insomnia - stopped the melatonin - still taking xanax for sleep - has continued to take xanax 1-1.5 for sleep - takes 1 additional dose during the day about once a week - did not try trazodone  #Ear concern - hx of tubes - feeling fullness - tubes came out   Review of Systems  10/25/2019: Clinic - Peripheral neuropathy - cymbalta 30 mg > 60 mg. Depression/grief - cymbalta. Insomnia/anxiety - prn xanax - discussed alternative in the future  Social History   Tobacco Use  Smoking Status Never Smoker  Smokeless Tobacco Never Used        Objective:    BP Readings from Last 3 Encounters:  11/29/19 104/78  10/25/19 96/66  10/18/19 118/78   Wt Readings from Last 3 Encounters:  11/29/19 175 lb (79.4 kg)  10/25/19 172 lb 8 oz (78.2 kg)  10/18/19 169 lb (76.7 kg)    BP 104/78 (BP Location: Left Arm, Patient Position: Sitting, Cuff Size: Large)   Pulse 80   Temp (!) 97.1 F (36.2 C)   Ht 5' 9.75" (1.772 m)   Wt 175 lb (79.4 kg)   LMP 06/27/2019   SpO2 99%   BMI 25.29 kg/m     Physical Exam Constitutional:      General: She is not in acute distress.    Appearance: She is well-developed. She is not diaphoretic.  HENT:     Right Ear: Tympanic membrane and external ear normal.     Left Ear: External ear normal. Tympanic membrane is scarred.     Nose: Nose normal.  Eyes:     Conjunctiva/sclera: Conjunctivae normal.  Cardiovascular:     Rate and Rhythm: Normal rate.  Pulmonary:     Effort: Pulmonary effort is normal.  Musculoskeletal:     Cervical back: Neck supple.  Skin:    General: Skin is warm and dry.     Capillary Refill: Capillary refill takes less than 2 seconds.  Neurological:     Mental Status: She is alert. Mental status is at baseline.  Psychiatric:        Mood and Affect: Mood normal.        Behavior: Behavior normal.           Assessment & Plan:   Problem List Items Addressed This Visit      Nervous and Auditory   Peripheral neuropathy    Symptoms improving on cymbalta. Discussed that some of her symptoms could also be post covid. Continue cymbalta.       Relevant Medications   DULoxetine (CYMBALTA) 60 MG capsule  Other   Depression    Improved on cymbalta and with grief therapy group.       Relevant Medications   DULoxetine (CYMBALTA) 60 MG capsule   Insomnia - Primary    Only prescription she ever tried was xanax. Taking nightly and occasionally needs 2nd pill. Discussed possibly switching to trazodone but with other new medications she would like to hold off. Prn anxiety symptoms xanax ~1 time per week.       Grief   Relevant Medications   DULoxetine (CYMBALTA) 60 MG capsule   Menopause    Last period was 5 months ago. Pt now with hot flashes. Discussed herbal options for treating hot flashes but also discussed that menopause could be contributing to other symptoms (insomnia, depression, etc). She will focus on diet/exercise and if symptoms not improving may consider HRT. Of note hx of migraines which have  resolved - may need to consider these could return with estrogen - patch may be a better option. Would need combined therapy          Return in about 2 months (around 01/29/2020).  Lesleigh Noe, MD

## 2019-11-29 NOTE — Assessment & Plan Note (Signed)
Last period was 5 months ago. Pt now with hot flashes. Discussed herbal options for treating hot flashes but also discussed that menopause could be contributing to other symptoms (insomnia, depression, etc). She will focus on diet/exercise and if symptoms not improving may consider HRT. Of note hx of migraines which have resolved - may need to consider these could return with estrogen - patch may be a better option. Would need combined therapy

## 2019-11-29 NOTE — Patient Instructions (Addendum)
[email or web address] Does compounding and biologic options Bio-identical Hormones  Let me know if you want to try Trazodone  Omeprazole - stop after 8 weeks, if symptoms return call back for referrals/labs  Menopause Menopause may increase your risk for:  Loss of bone (osteoporosis), which causes bone breaks (fractures).  Depression.  Hardening and narrowing of the arteries (atherosclerosis), which can cause heart attacks and strokes. What are the causes? This condition is usually caused by a natural change in hormone levels that happens as you get older. The condition may also be caused by surgery to remove both ovaries (bilateral oophorectomy). Follow these instructions at home: Lifestyle  Do not use any products that contain nicotine or tobacco, such as cigarettes and e-cigarettes. If you need help quitting, ask your health care provider.  Get at least 30 minutes of physical activity on 5 or more days each week.  Avoid alcoholic and caffeinated beverages, as well as spicy foods. This may help prevent hot flashes.  Get 7-8 hours of sleep each night.  If you have hot flashes, try: ? Dressing in layers. ? Avoiding things that may trigger hot flashes, such as spicy food, hot drinks, alcohol, caffeine, warm places, or stress. ? Taking slow, deep breaths when a hot flash starts. ? Keeping a fan in your home and office.  Find ways to manage stress: regular exercise, meditation, yoga, qigong, Tai Chi, biofeedback, acupuncture or massage  Consider going to group therapy with other women who are having menopause symptoms. Ask your health care provider about recommended group therapy meetings.  Staying cool while sleeping: dress in light clothing, use layed bedding that can be easily removed, Sleep with a fan nearby, put an ice pack under your pillow and flip your pillow regularly  Eating and drinking  Eat a healthy, balanced diet that contains whole grains, lean protein,  low-fat dairy, and plenty of fruits and vegetables.  Your health care provider may recommend adding more soy to your diet. Foods that contain soy include tofu, tempeh, and soy milk.  Eat plenty of foods that contain calcium and vitamin D for bone health. Items that are rich in calcium include low-fat milk, yogurt, beans, almonds, sardines, broccoli, and kale. Medicines  Non-prescription medications for Hot Flashes ? Soy - eat 1-2 servings of soy foods daily ? Herbs: like black cohosh have shown some improvement with hot flashes  Talk with your health care provider before starting any herbal supplements. If prescribed, take vitamins and supplements as told by your health care provider. These may include: ? Calcium. Women age 53 and older should get 1,200 mg (milligrams) of calcium every day. ? Vitamin D. Women need 600-800 International Units of vitamin D each day.  Prescription Medications ? Hormone Treatment: increased risk of breast cancer and cardiovascular disease. Should be used for a short time and in women under 60 have a lower risk overall if they take it ? Non-Hormone Treatment: Paroxetine which is an antidepressant, has been shown to reduce Hot Flashes

## 2019-11-29 NOTE — Assessment & Plan Note (Signed)
Only prescription she ever tried was xanax. Taking nightly and occasionally needs 2nd pill. Discussed possibly switching to trazodone but with other new medications she would like to hold off. Prn anxiety symptoms xanax ~1 time per week.

## 2019-11-29 NOTE — Assessment & Plan Note (Signed)
Symptoms improving on cymbalta. Discussed that some of her symptoms could also be post covid. Continue cymbalta.

## 2019-12-24 ENCOUNTER — Encounter: Payer: Self-pay | Admitting: Family Medicine

## 2019-12-24 DIAGNOSIS — K219 Gastro-esophageal reflux disease without esophagitis: Secondary | ICD-10-CM

## 2019-12-29 ENCOUNTER — Encounter: Payer: Self-pay | Admitting: Internal Medicine

## 2020-01-08 DIAGNOSIS — M1712 Unilateral primary osteoarthritis, left knee: Secondary | ICD-10-CM | POA: Diagnosis not present

## 2020-01-08 DIAGNOSIS — M545 Low back pain: Secondary | ICD-10-CM | POA: Diagnosis not present

## 2020-01-08 DIAGNOSIS — M1711 Unilateral primary osteoarthritis, right knee: Secondary | ICD-10-CM | POA: Diagnosis not present

## 2020-01-14 ENCOUNTER — Other Ambulatory Visit: Payer: Self-pay | Admitting: Family Medicine

## 2020-01-14 DIAGNOSIS — K219 Gastro-esophageal reflux disease without esophagitis: Secondary | ICD-10-CM

## 2020-01-14 DIAGNOSIS — R053 Chronic cough: Secondary | ICD-10-CM

## 2020-01-19 DIAGNOSIS — M1711 Unilateral primary osteoarthritis, right knee: Secondary | ICD-10-CM | POA: Diagnosis not present

## 2020-01-19 DIAGNOSIS — M1712 Unilateral primary osteoarthritis, left knee: Secondary | ICD-10-CM | POA: Diagnosis not present

## 2020-01-21 ENCOUNTER — Encounter: Payer: Self-pay | Admitting: *Deleted

## 2020-01-26 DIAGNOSIS — M1711 Unilateral primary osteoarthritis, right knee: Secondary | ICD-10-CM | POA: Diagnosis not present

## 2020-02-02 DIAGNOSIS — M1711 Unilateral primary osteoarthritis, right knee: Secondary | ICD-10-CM | POA: Diagnosis not present

## 2020-02-09 ENCOUNTER — Ambulatory Visit: Payer: BC Managed Care – PPO | Admitting: Internal Medicine

## 2020-02-09 ENCOUNTER — Encounter: Payer: Self-pay | Admitting: Internal Medicine

## 2020-02-09 VITALS — BP 118/72 | HR 78 | Ht 70.0 in | Wt 180.4 lb

## 2020-02-09 DIAGNOSIS — K5900 Constipation, unspecified: Secondary | ICD-10-CM

## 2020-02-09 DIAGNOSIS — Z01818 Encounter for other preprocedural examination: Secondary | ICD-10-CM | POA: Diagnosis not present

## 2020-02-09 DIAGNOSIS — R140 Abdominal distension (gaseous): Secondary | ICD-10-CM | POA: Diagnosis not present

## 2020-02-09 DIAGNOSIS — K219 Gastro-esophageal reflux disease without esophagitis: Secondary | ICD-10-CM | POA: Diagnosis not present

## 2020-02-09 DIAGNOSIS — R1310 Dysphagia, unspecified: Secondary | ICD-10-CM

## 2020-02-09 DIAGNOSIS — R1319 Other dysphagia: Secondary | ICD-10-CM

## 2020-02-09 NOTE — Patient Instructions (Addendum)
You have been scheduled for an endoscopy. Please follow written instructions given to you at your visit today. If you use inhalers (even only as needed), please bring them with you on the day of your procedure. Your physician has requested that you go to www.startemmi.com and enter the access code given to you at your visit today. This web site gives a general overview about your procedure. However, you should still follow specific instructions given to you by our office regarding your preparation for the procedure.  Continue omeprazole 20 mg daily  Start Miralax 17 g daily  Continue Benefiber 2-3 teaspoons twice daily   I appreciate the  opportunity to care for you  Thank You   Ulice Dash Pyrtle,MD

## 2020-02-09 NOTE — Progress Notes (Signed)
Patient ID: [name], female   DOB: Jun 14, 1961, 59 y.o.   MRN: [id number] HPI: [name] is a 59 year old female with a history of Hashimoto's thyroiditis and now hypothyroidism, migraines, fibromyalgia and anxiety who seen in consultation at the request of Dr. Einar Pheasant to evaluate GERD.  She is here alone today.  She also wishes to discuss abdominal bloating and constipation.  She is known to me from screening colonoscopy which was performed in November 2014.  A polyp was removed from the transverse colon which was found to be a lymphoid polyp.  No adenomatous change.  She reports that she had LPR type symptoms specifically morning cough and these fits of coughing which were thought to be reflux related.  She was not having traditional heartburn.  She also had throat clearing as well as hoarseness and raspy voice.  She has taken nearly 8 weeks of omeprazole therapy and the coughing definitively improved.  She was going to stop omeprazole but then had return of cough for a brief time and thus omeprazole was continued.  She does notice some dysphagia with light food that she describes as "crumbly" such as cake.  Large pills are also difficult for her to swallow.  This is been going on for more than a few months.  She reports that her bowel movements have varied over the last 2 years.  She has had a lot of stress in her life having lost both of her parents within the last nearly 2 years.  Her mother died in 2018/04/23 in a car accident your father died of COVID-19 in 09-23-2022 of this year.  She lost weight about 25 pounds after her mother died but has gained 30 pounds back.  She had diarrhea when her mother passed away but this has resolved and she is now constipated.  She describes hard pellet-like stools.  This happens multiple times per day and there is incomplete evacuation.  She can even have small hard stools at nighttime.  She does report abdominal bloating and fullness symptom.  She has been taking  probiotic.  She added Benefiber attempted 2 teaspoons twice a day but usually only takes it once a day.  With this her stools have improved somewhat but they remain pellet-like.  She has not had rectal bleeding or melena.  She does take vitamin supplementation with GABA, melatonin for sleep, magnesium, selenium, turmeric, vitamin D and E  Past Medical History:  Diagnosis Date  . Allergic rhinitis   . Anxiety   . Cervical dysplasia 1990   s/p conization  . Chronic sinusitis   . Depression   . Fibromyalgia   . Hip fracture, left ([place]) 2005   s/p ORIF  . Hypothyroidism   . Migraine   . Neuropathy     Past Surgical History:  Procedure Laterality Date  . BREAST ENHANCEMENT SURGERY  2007  . BREAST IMPLANT REMOVAL Bilateral 05/21/2018  . DILATION AND CURETTAGE OF UTERUS  1989  . ORIF HIP FRACTURE  2005   [place]  . TONSILLECTOMY AND ADENOIDECTOMY      Outpatient Medications Prior to Visit  Medication Sig Dispense Refill  . albuterol (VENTOLIN HFA) 108 (90 Base) MCG/ACT inhaler Inhale 2 puffs into the lungs every 4 (four) hours as needed for wheezing or shortness of breath. 18 g 0  . ALPRAZolam (XANAX) 0.25 MG tablet Take 1 tid prn 90 tablet 5  . ARMOUR THYROID 60 MG tablet TAKE 1 TABLET BY MOUTH DAILY BEFORE BREAKFAST 90  tablet 3  . baclofen (LIORESAL) 10 MG tablet as needed.     . budesonide (RHINOCORT ALLERGY) 32 MCG/ACT nasal spray Place 1 spray into both nostrils daily. 8.6 g 0  . cetirizine (ZYRTEC) 10 MG tablet Take 10 mg by mouth at bedtime.    . Cholecalciferol (VITAMIN D PO) Take by mouth daily.    . DULoxetine (CYMBALTA) 60 MG capsule Take 1 capsule (60 mg total) by mouth daily. Start with 30 mg daily. Then after 1 week, Increase to 60 mg daily. 90 capsule 3  . fexofenadine (ALLEGRA) 60 MG tablet Take 60 mg by mouth daily.    . fluticasone (FLONASE) 50 MCG/ACT nasal spray USE 2 SPRAYS INTO EACH NOSTRIL DAILY 16 g 11  . GAMMA AMINOBUTYRIC ACID PO Take 750 mg by mouth  daily.    . hydrOXYzine (ATARAX/VISTARIL) 50 MG tablet Take 1 tablet (50 mg total) by mouth every 6 (six) hours as needed. 30 tablet 0  . Magnesium Citrate 100 MG TABS Take 200 mg by mouth daily.    . meclizine (ANTIVERT) 25 MG tablet Take 1 tablet (25 mg total) by mouth 3 (three) times daily as needed for dizziness. 30 tablet 0  . Melatonin 10 MG TABS Take 10 mg by mouth at bedtime.    . methocarbamol (ROBAXIN) 500 MG tablet Take 1 tablet (500 mg total) by mouth 2 (two) times daily as needed for muscle spasms. 60 tablet 1  . Multiple Vitamin (MULTIVITAMIN) capsule Take 1 capsule by mouth daily.      . Omega-3 Fatty Acids (FISH OIL PO) Take by mouth daily.    Marland Kitchen omeprazole (PRILOSEC) 20 MG capsule TAKE 1 CAPSULE BY MOUTH DAILY 90 capsule 0  . OVER THE COUNTER MEDICATION CBD Oil    . OVER THE COUNTER MEDICATION Seleium 200 mg    . SUMAtriptan (IMITREX) 50 MG tablet Take 1 tablet (50 mg total) by mouth every 2 (two) hours as needed for migraine. May repeat in 2 hours if headache persists or recurs. 10 tablet 5  . TURMERIC CURCUMIN PO Take 1 tablet by mouth daily.    . vitamin E 400 UNIT capsule Take 400 Units by mouth daily.     No facility-administered medications prior to visit.    No Known Allergies  Family History  Problem Relation Age of Onset  . Arthritis/Rheumatoid Mother        RA and OA  . Breast cancer Maternal Aunt 48  . Irregular heart beat Father        need ablation  . Basal cell carcinoma Father   . Arthritis Maternal Grandmother   . Heart disease Maternal Grandmother   . Lung cancer Paternal Grandfather   . Colon cancer Neg Hx   . Pancreatic cancer Neg Hx   . Stomach cancer Neg Hx     Social History   Tobacco Use  . Smoking status: Never Smoker  . Smokeless tobacco: Never Used  Vaping Use  . Vaping Use: Never used  Substance Use Topics  . Alcohol use: Yes    Comment: rarely  . Drug use: No    ROS: As per history of present illness, otherwise  negative  BP 118/72   Pulse 78   Ht 5\' 10"  (1.778 m)   Wt 180 lb 6 oz (81.8 kg)   BMI 25.88 kg/m  Constitutional: Well-developed and well-nourished. No distress. HEENT: Normocephalic and atraumatic. Conjunctivae are normal.  No scleral icterus. Neck: Neck supple. Trachea midline.  Cardiovascular: Normal rate, regular rhythm and intact distal pulses. No M/R/G Pulmonary/chest: Effort normal and breath sounds normal. No wheezing, rales or rhonchi. Abdominal: Soft, nontender, mildly distended. Bowel sounds active throughout. There are no masses palpable. No hepatosplenomegaly. Extremities: no clubbing, cyanosis, or edema Neurological: Alert and oriented to person place and time. Skin: Skin is warm and dry.  Psychiatric: Normal mood and affect. Behavior is normal.  RELEVANT LABS AND IMAGING: CBC    Component Value Date/Time   WBC 5.2 06/14/2019 1022   RBC 4.02 06/14/2019 1022   HGB 12.6 06/14/2019 1022   HCT 38.0 06/14/2019 1022   PLT 261.0 06/14/2019 1022   MCV 94.6 06/14/2019 1022   MCHC 33.1 06/14/2019 1022   RDW 14.4 06/14/2019 1022   LYMPHSABS 1.8 06/14/2019 1022   MONOABS 0.5 06/14/2019 1022   EOSABS 0.2 06/14/2019 1022   BASOSABS 0.1 06/14/2019 1022    CMP     Component Value Date/Time   NA 137 10/25/2019 1300   K 4.4 10/25/2019 1300   CL 103 10/25/2019 1300   CO2 30 10/25/2019 1300   GLUCOSE 92 10/25/2019 1300   BUN 22 10/25/2019 1300   CREATININE 0.90 10/25/2019 1300   CALCIUM 9.6 10/25/2019 1300   PROT 7.4 10/25/2019 1300   ALBUMIN 4.0 10/25/2019 1300   AST 20 10/25/2019 1300   ALT 14 10/25/2019 1300   ALKPHOS 59 10/25/2019 1300   BILITOT 0.5 10/25/2019 1300   GFRNONAA 54 (L) 02/13/2010 2018   GFRAA  02/13/2010 2018    >60        The eGFR has been calculated using the MDRD equation. This calculation has not been validated in all clinical situations. eGFR's persistently <60 mL/min signify possible Chronic Kidney Disease.     ASSESSMENT/PLAN: 59 year old female with a history of Hashimoto's thyroiditis and now hypothyroidism, migraines, fibromyalgia and anxiety who seen in consultation at the request of Dr. Einar Pheasant to evaluate GERD, also with complaint of dysphagia, constipation and abdominal bloating  1. GERD/LPR/dysphagia --her coughing and throat clearing improved with omeprazole 20 mg daily.  This is very indicative of LPR.  She does not have classic heartburn symptoms.  The dysphagia raises the question of esophagitis, eosinophilic esophagitis, dysmotility and stricture.  I recommended that she continue the omeprazole 20 mg a day and we pursue upper endoscopy.  We discussed the risk, benefits and alternatives and she is agreeable and wishes to proceed --Continue omeprazole 20 mg a day --EGD in the [place], rule out EoE and stricture, possible dilation  2.  Constipation with abdominal bloating --this is a somewhat chronic symptom for her.  It is also possible that when she had COVID-19 in November her gut microbiota was disrupted leading to further alteration in GI motility.  That said, a remote CT scan of the abdomen pelvis from June 2011 showed fecalization in the ileum raising the question of small bowel dysmotility.  Given this finding she may in fact have chronic small and large bowel dysmotility leading to her constipation.  SIBO is also a possibility.  I recommended the following --Continue Benefiber but try to get 2 teaspoons in 2-3 times daily --Add MiraLAX 17 g daily to try to help constipation and also bloating --If no benefit in constipation or bloating then discontinue MiraLAX and try another laxative such as Linzess --If constipation improves but bloating does not I would recommend SIBO breath testing --She has been on probiotic, I am not sure she is benefiting.  She can take  this if she desires but it may not be helpful or needed  3.  CRC screening --complete colonoscopy as above without adenomatous polyp.   Repeat screening would be recommended in November 2024      GA:IDKS, Jobe Marker, [place],  [place] [id number]

## 2020-02-10 DIAGNOSIS — M1711 Unilateral primary osteoarthritis, right knee: Secondary | ICD-10-CM | POA: Diagnosis not present

## 2020-02-18 ENCOUNTER — Encounter: Payer: Self-pay | Admitting: Internal Medicine

## 2020-02-18 DIAGNOSIS — M1711 Unilateral primary osteoarthritis, right knee: Secondary | ICD-10-CM | POA: Diagnosis not present

## 2020-02-24 ENCOUNTER — Other Ambulatory Visit: Payer: Self-pay | Admitting: Family Medicine

## 2020-02-24 DIAGNOSIS — F329 Major depressive disorder, single episode, unspecified: Secondary | ICD-10-CM

## 2020-02-24 DIAGNOSIS — F4321 Adjustment disorder with depressed mood: Secondary | ICD-10-CM

## 2020-02-24 DIAGNOSIS — G6289 Other specified polyneuropathies: Secondary | ICD-10-CM

## 2020-02-25 ENCOUNTER — Telehealth: Payer: Self-pay

## 2020-02-25 ENCOUNTER — Other Ambulatory Visit: Payer: Self-pay | Admitting: Family Medicine

## 2020-02-25 ENCOUNTER — Ambulatory Visit (INDEPENDENT_AMBULATORY_CARE_PROVIDER_SITE_OTHER): Payer: BC Managed Care – PPO

## 2020-02-25 ENCOUNTER — Other Ambulatory Visit: Payer: Self-pay | Admitting: Internal Medicine

## 2020-02-25 DIAGNOSIS — G6289 Other specified polyneuropathies: Secondary | ICD-10-CM

## 2020-02-25 DIAGNOSIS — F4321 Adjustment disorder with depressed mood: Secondary | ICD-10-CM

## 2020-02-25 DIAGNOSIS — Z1159 Encounter for screening for other viral diseases: Secondary | ICD-10-CM | POA: Diagnosis not present

## 2020-02-25 DIAGNOSIS — F329 Major depressive disorder, single episode, unspecified: Secondary | ICD-10-CM

## 2020-02-25 NOTE — Telephone Encounter (Signed)
Sending in a 90 day supply of 60mg  tablet. Will only need to take 1 capsule per day

## 2020-02-25 NOTE — Telephone Encounter (Signed)
Error

## 2020-02-25 NOTE — Telephone Encounter (Signed)
Patient is taking 2 tablets daily, Last refilled 11/29/19 for #90 with 3 refills. Ok to refill?

## 2020-02-26 LAB — SARS CORONAVIRUS 2 (TAT 6-24 HRS): SARS Coronavirus 2: NEGATIVE

## 2020-02-29 ENCOUNTER — Ambulatory Visit (AMBULATORY_SURGERY_CENTER): Payer: BC Managed Care – PPO | Admitting: Internal Medicine

## 2020-02-29 ENCOUNTER — Other Ambulatory Visit: Payer: Self-pay

## 2020-02-29 ENCOUNTER — Encounter: Payer: Self-pay | Admitting: Internal Medicine

## 2020-02-29 VITALS — BP 129/80 | HR 57 | Temp 97.5°F | Resp 9 | Ht 70.0 in | Wt 180.0 lb

## 2020-02-29 DIAGNOSIS — R1310 Dysphagia, unspecified: Secondary | ICD-10-CM

## 2020-02-29 DIAGNOSIS — K219 Gastro-esophageal reflux disease without esophagitis: Secondary | ICD-10-CM | POA: Diagnosis not present

## 2020-02-29 DIAGNOSIS — K449 Diaphragmatic hernia without obstruction or gangrene: Secondary | ICD-10-CM

## 2020-02-29 DIAGNOSIS — K200 Eosinophilic esophagitis: Secondary | ICD-10-CM | POA: Diagnosis not present

## 2020-02-29 DIAGNOSIS — K228 Other specified diseases of esophagus: Secondary | ICD-10-CM | POA: Diagnosis not present

## 2020-02-29 DIAGNOSIS — R1319 Other dysphagia: Secondary | ICD-10-CM

## 2020-02-29 DIAGNOSIS — K222 Esophageal obstruction: Secondary | ICD-10-CM

## 2020-02-29 MED ORDER — OMEPRAZOLE 40 MG PO CPDR
40.0000 mg | DELAYED_RELEASE_CAPSULE | Freq: Every day | ORAL | 4 refills | Status: DC
Start: 2020-02-29 — End: 2020-09-27

## 2020-02-29 MED ORDER — SODIUM CHLORIDE 0.9 % IV SOLN: 500.0000 mL | Freq: Once | INTRAVENOUS | Status: DC

## 2020-02-29 NOTE — Op Note (Signed)
[facility] Patient Name: [name] Procedure Date: 02/29/2020 3:29 PM MRN: [id number] Endoscopist: Jerene Bears , MD Age: 59 Referring MD:  Date of Birth: 1961-07-08 Gender: Female Account #: [id number] Procedure:                Upper GI endoscopy Indications:              Dysphagia, Suspected gastro-esophageal reflux                            disease, LPR symptoms Medicines:                Monitored Anesthesia Care Procedure:                Pre-Anesthesia Assessment:                           - Prior to the procedure, a History and Physical                            was performed, and patient medications and                            allergies were reviewed. The patient's tolerance of                            previous anesthesia was also reviewed. The risks                            and benefits of the procedure and the sedation                            options and risks were discussed with the patient.                            All questions were answered, and informed consent                            was obtained. Prior Anticoagulants: The patient has                            taken no previous anticoagulant or antiplatelet                            agents. ASA Grade Assessment: II - A patient with                            mild systemic disease. After reviewing the risks                            and benefits, the patient was deemed in                            satisfactory condition to undergo the procedure.  After obtaining informed consent, the endoscope was                            passed under direct vision. Throughout the                            procedure, the patient's blood pressure, pulse, and                            oxygen saturations were monitored continuously. The                            Endoscope was introduced through the mouth, and                            advanced to the second part of duodenum.  The upper                            GI endoscopy was accomplished without difficulty.                            The patient tolerated the procedure well. Scope In: Scope Out: Findings:                 Normal mucosa was found in the entire esophagus.                            Biopsies were taken with a cold forceps for                            histology.                           A non-obstructing Schatzki ring was found at the                            gastroesophageal junction. The scope was withdrawn.                            Dilation was performed with a Maloney dilator with                            mild resistance at 52 Fr.                           A 1 cm hiatal hernia was present.                           The gastroesophageal flap valve was visualized                            endoscopically and classified as Hill Grade II                            (fold present, opens with  respiration).                           The entire examined stomach was normal.                           The examined duodenum was normal. Complications:            No immediate complications. Estimated Blood Loss:     Estimated blood loss was minimal. Impression:               - Normal mucosa was found in the entire esophagus.                            Biopsied (after dilation).                           - Non-obstructing Schatzki ring. Esophageal                            dilation with 52 Fr Maloney.                           - 1 cm hiatal hernia.                           - Normal stomach.                           - Normal examined duodenum. Recommendation:           - Patient has a contact number available for                            emergencies. The signs and symptoms of potential                            delayed complications were discussed with the                            patient. Return to normal activities tomorrow.                            Written discharge instructions  were provided to the                            patient.                           - Resume previous diet.                           - Continue present medications. Continue omeprazole                            20 mg once daily.                           -  Await pathology results.                           - Office follow-up for continuity in 3-4 months. Jerene Bears, MD 02/29/2020 3:56:44 PM This report has been signed electronically.

## 2020-02-29 NOTE — Progress Notes (Signed)
VS by CW  Pt's states no medical or surgical changes since previsit or office visit.  

## 2020-02-29 NOTE — Patient Instructions (Addendum)
Handouts given: Hiatal Hernia, GERD, stricture Resume previous diet  continue present medications Continue omperazole 40mg  once daily Await pathology results\ Follow up in office in 3-4 months   YOU HAD AN ENDOSCOPIC PROCEDURE TODAY AT [facility]:   Refer to the procedure report that was given to you for any specific questions about what was found during the examination.  If the procedure report does not answer your questions, please call your gastroenterologist to clarify.  If you requested that your care partner not be given the details of your procedure findings, then the procedure report has been included in a sealed envelope for you to review at your convenience later.  YOU SHOULD EXPECT: Some feelings of bloating in the abdomen. Passage of more gas than usual.  Walking can help get rid of the air that was put into your GI tract during the procedure and reduce the bloating. If you had a lower endoscopy (such as a colonoscopy or flexible sigmoidoscopy) you may notice spotting of blood in your stool or on the toilet paper. If you underwent a bowel prep for your procedure, you may not have a normal bowel movement for a few days.  Please Note:  You might notice some irritation and congestion in your nose or some drainage.  This is from the oxygen used during your procedure.  There is no need for concern and it should clear up in a day or so.  SYMPTOMS TO REPORT IMMEDIATELY:   Following upper endoscopy (EGD)  Vomiting of blood or coffee ground material  New chest pain or pain under the shoulder blades  Painful or persistently difficult swallowing  New shortness of breath  Fever of 100F or higher  Black, tarry-looking stools  For urgent or emergent issues, a gastroenterologist can be reached at any hour by calling [phone]. Do not use MyChart messaging for urgent concerns.    DIET:  We do recommend a small meal at first, but then you may proceed to your regular  diet.  Drink plenty of fluids but you should avoid alcoholic beverages for 24 hours.  ACTIVITY:  You should plan to take it easy for the rest of today and you should NOT DRIVE or use heavy machinery until tomorrow (because of the sedation medicines used during the test).    FOLLOW UP: Our staff will call the number listed on your records 48-72 hours following your procedure to check on you and address any questions or concerns that you may have regarding the information given to you following your procedure. If we do not reach you, we will leave a message.  We will attempt to reach you two times.  During this call, we will ask if you have developed any symptoms of COVID 19. If you develop any symptoms (ie: fever, flu-like symptoms, shortness of breath, cough etc.) before then, please call [phone].  If you test positive for Covid 19 in the 2 weeks post procedure, please call and report this information to [place].    If any biopsies were taken you will be contacted by phone or by letter within the next 1-3 weeks.  Please call us at [phone] if you have not heard about the biopsies in 3 weeks.    SIGNATURES/CONFIDENTIALITY: You and/or your care partner have signed paperwork which will be entered into your electronic medical record.  These signatures attest to the fact that that the information above on your After Visit Summary has been reviewed and is understood.  Full responsibility of the confidentiality of this discharge information lies with you and/or your care-partner.

## 2020-02-29 NOTE — Progress Notes (Signed)
pt tolerated well. VSS. awake and to recovery. Report given to RN. Bite block inserted and removed without trauma. 

## 2020-02-29 NOTE — Progress Notes (Signed)
Called to room to assist during endoscopic procedure.  Patient ID and intended procedure confirmed with present staff. Received instructions for my participation in the procedure from the performing physician.  

## 2020-03-01 ENCOUNTER — Telehealth: Payer: Self-pay | Admitting: *Deleted

## 2020-03-01 NOTE — Telephone Encounter (Signed)
I have spoken to patient to advise of appointment time and date. She verbalizes understanding.  Patient also states that she had some avacodo toast this morning and noticed some of it getting stuck in the lower esophagus. She states that it wasn't very painful but wanted to make sure there wasn't anything abnormal about this post procedure. I advised that she probably has some irritation in the esophagus still as her esophagus was stretched. She has also been recently started on increased PPI dosage which should help with inflammation. I asked that she remain on soft foods for the next couple days and increase to additional foods as tolerated. Toast is probably something she should avoid today. She is advised to call back with any pain or additional issues. She verbalizes understanding.

## 2020-03-01 NOTE — Telephone Encounter (Signed)
-----   Message from [name] Bears, MD sent at 02/29/2020  4:12 PM EDT ----- Patient came for upper endoscopy today --see report for details.  Esophageal dilation with 57 French Maloney, esophageal biopsies, small hiatal hernia Decision made after procedure due to persistent symptoms of throat clearing and cough which have improved with low-dose omeprazole, to increase omeprazole to 40 mg once daily I would like to see her back in approximately 3 months for follow-up At that time we can assess symptoms on higher dose PPI  Please facilitate office visit and copy this note to her chart  Thanks JMP

## 2020-03-01 NOTE — Telephone Encounter (Signed)
Patient has been scheduled for follow up with Dr Hilarie Fredrickson on 05/22/20 at 2:10 pm. I have left a message for patient to call back.  It appears omeprazole 40 mg has already been sent to pharmacy.

## 2020-03-02 ENCOUNTER — Other Ambulatory Visit: Payer: Self-pay

## 2020-03-02 ENCOUNTER — Telehealth: Payer: Self-pay

## 2020-03-02 NOTE — Telephone Encounter (Signed)
Unable to leave message mailbox full.

## 2020-04-14 ENCOUNTER — Other Ambulatory Visit: Payer: Self-pay

## 2020-04-14 ENCOUNTER — Ambulatory Visit
Admission: EM | Admit: 2020-04-14 | Discharge: 2020-04-14 | Disposition: A | Discharge status: Home / Self Care | Payer: BC Managed Care – PPO | Attending: Physician Assistant | Admitting: Physician Assistant

## 2020-04-14 DIAGNOSIS — J0140 Acute pansinusitis, unspecified: Secondary | ICD-10-CM | POA: Diagnosis not present

## 2020-04-14 DIAGNOSIS — Z1152 Encounter for screening for COVID-19: Secondary | ICD-10-CM

## 2020-04-14 HISTORY — DX: Diaphragmatic hernia without obstruction or gangrene: K44.9

## 2020-04-14 HISTORY — DX: Esophageal obstruction: K22.2

## 2020-04-14 MED ORDER — AZELASTINE HCL 0.1 % NA SOLN: 2.0000 | Freq: Two times a day (BID) | NASAL | 0 refills | Status: DC

## 2020-04-14 MED ORDER — DOXYCYCLINE HYCLATE 100 MG PO CAPS
100.0000 mg | ORAL_CAPSULE | Freq: Two times a day (BID) | ORAL | 0 refills | Status: DC
Start: 2020-04-14 — End: 2020-09-27

## 2020-04-14 MED ORDER — DEXAMETHASONE SODIUM PHOSPHATE 10 MG/ML IJ SOLN
10.0000 mg | Freq: Once | INTRAMUSCULAR | Status: AC
  Administered 2020-04-14: 10 mg via INTRAMUSCULAR

## 2020-04-14 NOTE — Discharge Instructions (Signed)
COVID testing ordered.  Decadron injection in office today.  Start azelastine nasal spray as directed.  If symptoms not improving in 1 to 2 days, can start doxycycline for bacterial sinus infection. Tylenol/motrin for fever and pain. Monitor for any worsening of symptoms, chest pain, shortness of breath, wheezing, swelling of the throat, go to the emergency department for further evaluation needed.

## 2020-04-14 NOTE — ED Triage Notes (Signed)
Pt c/o nasal congestion x3wks. States hx of chronic sinus infections. States now having chest congestion with a raspy voice.

## 2020-04-14 NOTE — ED Provider Notes (Signed)
[facility]    CSN: [id number] Arrival date & time: 04/14/20  0932      History   Chief Complaint Chief Complaint  Patient presents with  . Nasal Congestion    HPI Jenavi Beedle is a 59 y.o. female.   59 year old female comes in for 3 week history of URI symptoms. Nasal congestion, facial pressure/teeth pain. States now developing post nasal drainage, chest congestion, productive cough. Denies fever, shortness of breath, loss of taste/smell. Never smoker.      Past Medical History:  Diagnosis Date  . Allergic rhinitis   . Anxiety   . Cervical dysplasia 1990   s/p conization  . Chronic sinusitis   . Depression   . Fibromyalgia   . Hip fracture, left ([place]) 2005   s/p ORIF  . Hypothyroidism   . Migraine   . Neuropathy     Patient Active Problem List   Diagnosis Date Noted  . Menopause 11/29/2019  . Prediabetes 10/25/2019  . Chronic cough 10/18/2019  . Grief 09/20/2019  . Anxiety 09/08/2019  . Fibromyalgia 09/08/2019  . Bronchitis with acute wheezing 07/12/2019  . Peripheral neuropathy 06/14/2019  . BPV (benign positional vertigo), right 06/14/2019  . Dust exposure 10/21/2018  . Vitamin D deficiency 09/14/2018  . Loose stools 09/14/2018  . Unintentional weight loss 09/14/2018  . Fatigue 10/01/2017  . Generalized anxiety disorder 11/21/2015  . Arthralgia 08/14/2015  . Vitamin B12 deficiency 08/10/2014  . Insomnia 06/30/2012  . Migraine with status migrainosus 05/23/2011  . Depression   . Hypothyroidism due to Hashimoto's thyroiditis     Past Surgical History:  Procedure Laterality Date  . BREAST ENHANCEMENT SURGERY  2007  . BREAST IMPLANT REMOVAL Bilateral 05/21/2018  . DILATION AND CURETTAGE OF UTERUS  1989  . ORIF HIP FRACTURE  2005   D[place]orf  . TONSILLECTOMY AND ADENOIDECTOMY      OB History   No obstetric history on file.      Home Medications    Prior to Admission medications   Medication Sig Start Date End Date  Taking? Authorizing Provider  albuterol (VENTOLIN HFA) 108 (90 Base) MCG/ACT inhaler Inhale 2 puffs into the lungs every 4 (four) hours as needed for wheezing or shortness of breath. 07/26/19   Hall-Potvin, [place], PA-C  ALPRAZolam Duanne Moron) 0.25 MG tablet Take 1 tid prn 09/08/19   Lucille Passy, MD  ARMOUR THYROID 60 MG tablet TAKE 1 TABLET BY MOUTH DAILY BEFORE BREAKFAST 09/16/19   Lucille Passy, MD  azelastine (ASTELIN) 0.1 % nasal spray Place 2 sprays into both nostrils 2 (two) times daily. 04/14/20   Tasia Catchings, Safira Proffit V, PA-C  baclofen (LIORESAL) 10 MG tablet as needed.  07/08/11   [provider]  budesonide (RHINOCORT ALLERGY) 32 MCG/ACT nasal spray Place 1 spray into both nostrils daily. 07/26/19   Hall-Potvin, [place], PA-C  cetirizine (ZYRTEC) 10 MG tablet Take 10 mg by mouth at bedtime.    [provider]  Cholecalciferol (VITAMIN D PO) Take by mouth daily.    [provider]  doxycycline (VIBRAMYCIN) 100 MG capsule Take 1 capsule (100 mg total) by mouth 2 (two) times daily. 04/14/20   Tasia Catchings, Mykaylah Ballman V, PA-C  DULoxetine (CYMBALTA) 60 MG capsule Take 1 capsule (60 mg total) by mouth daily. 02/25/20   Lesleigh Noe, MD  fexofenadine (ALLEGRA) 60 MG tablet Take 60 mg by mouth daily.    [provider]  fluticasone (FLONASE) 50 MCG/ACT nasal spray USE  2 SPRAYS INTO EACH NOSTRIL DAILY 07/27/19   Lucille Passy, MD  GAMMA AMINOBUTYRIC ACID PO Take 750 mg by mouth daily.    [provider]  hydrOXYzine (ATARAX/VISTARIL) 50 MG tablet Take 1 tablet (50 mg total) by mouth every 6 (six) hours as needed. 10/22/18   Lucille Passy, MD  Magnesium Citrate 100 MG TABS Take 200 mg by mouth daily.    [provider]  meclizine (ANTIVERT) 25 MG tablet Take 1 tablet (25 mg total) by mouth 3 (three) times daily as needed for dizziness. 06/14/19   Lucille Passy, MD  Melatonin 10 MG TABS Take 10 mg by mouth at bedtime.    [provider]  methocarbamol (ROBAXIN) 500 MG  tablet Take 1 tablet (500 mg total) by mouth 2 (two) times daily as needed for muscle spasms. 09/08/19   Lucille Passy, MD  Multiple Vitamin (MULTIVITAMIN) capsule Take 1 capsule by mouth daily.      [provider]  Omega-3 Fatty Acids (FISH OIL PO) Take by mouth daily.    [provider]  omeprazole (PRILOSEC) 20 MG capsule TAKE 1 CAPSULE BY MOUTH DAILY 01/14/20   Lesleigh Noe, MD  omeprazole (PRILOSEC) 40 MG capsule Take 1 capsule (40 mg total) by mouth daily. 02/29/20   Pyrtle, Lajuan Lines, MD  OVER THE COUNTER MEDICATION CBD Oil    [provider]  OVER THE COUNTER MEDICATION Seleium 200 mg    [provider]  SUMAtriptan (IMITREX) 50 MG tablet Take 1 tablet (50 mg total) by mouth every 2 (two) hours as needed for migraine. May repeat in 2 hours if headache persists or recurs. 09/20/19   Lucille Passy, MD  TURMERIC CURCUMIN PO Take 1 tablet by mouth daily.    [provider]  vitamin E 400 UNIT capsule Take 400 Units by mouth daily.    [provider]    Family History Family History  Problem Relation Age of Onset  . Arthritis/Rheumatoid Mother        RA and OA  . Breast cancer Maternal Aunt 48  . Irregular heart beat Father        need ablation  . Basal cell carcinoma Father   . Arthritis Maternal Grandmother   . Heart disease Maternal Grandmother   . Lung cancer Paternal Grandfather   . Colon cancer Neg Hx   . Pancreatic cancer Neg Hx   . Stomach cancer Neg Hx   . Esophageal cancer Neg Hx   . Rectal cancer Neg Hx     Social History Social History   Tobacco Use  . Smoking status: Never Smoker  . Smokeless tobacco: Never Used  Vaping Use  . Vaping Use: Never used  Substance Use Topics  . Alcohol use: Yes    Comment: rarely  . Drug use: No     Allergies   Patient has no known allergies.   Review of Systems Review of Systems  Reason unable to perform ROS: See HPI as above.     Physical Exam Triage Vital  Signs ED Triage Vitals  Enc Vitals Group     BP 04/14/20 0942 121/68     Pulse Rate 04/14/20 0942 71     Resp 04/14/20 0942 18     Temp 04/14/20 0942 98.2 F (36.8 C)     Temp Source 04/14/20 0942 Oral     SpO2 04/14/20 0942 98 %     Weight --  Height --      Head Circumference --      Peak Flow --      Pain Score 04/14/20 0945 4     Pain Loc --      Pain Edu? --      Excl. in [place]? --    No data found.  Updated Vital Signs BP 121/68 (BP Location: Left Arm)   Pulse 71   Temp 98.2 F (36.8 C) (Oral)   Resp 18   LMP 07/05/2019   SpO2 98%   Physical Exam Constitutional:      General: She is not in acute distress.    Appearance: Normal appearance. She is well-developed. She is not ill-appearing, toxic-appearing or diaphoretic.  HENT:     Head: Normocephalic and atraumatic.     Right Ear: Tympanic membrane, ear canal and external ear normal. Tympanic membrane is not erythematous or bulging.     Left Ear: Tympanic membrane, ear canal and external ear normal. Tympanic membrane is not erythematous or bulging.     Nose:     Right Sinus: Maxillary sinus tenderness and frontal sinus tenderness present.     Left Sinus: Maxillary sinus tenderness and frontal sinus tenderness present.     Mouth/Throat:     Mouth: Mucous membranes are moist.     Pharynx: Oropharynx is clear. Uvula midline.  Eyes:     Extraocular Movements: Extraocular movements intact.     Conjunctiva/sclera: Conjunctivae normal.     Pupils: Pupils are equal, round, and reactive to light.  Cardiovascular:     Rate and Rhythm: Normal rate and regular rhythm.  Pulmonary:     Effort: Pulmonary effort is normal. No accessory muscle usage, prolonged expiration, respiratory distress or retractions.     Breath sounds: No decreased air movement or transmitted upper airway sounds. No decreased breath sounds.     Comments: LCTAB Musculoskeletal:     Cervical back: Normal range of motion and neck supple.  Skin:     General: Skin is warm and dry.  Neurological:     Mental Status: She is alert and oriented to person, place, and time.      UC Treatments / Results  Labs (all labs ordered are listed, but only abnormal results are displayed) Labs Reviewed  NOVEL CORONAVIRUS, NAA    EKG   Radiology No results found.  Procedures Procedures (including critical care time)  Medications Ordered in UC Medications  dexamethasone (DECADRON) injection 10 mg (has no administration in time range)    Initial Impression / Assessment and Plan / UC Course  I have reviewed the triage vital signs and the nursing notes.  Pertinent labs & imaging results that were available during my care of the patient were reviewed by me and considered in my medical decision making (see chart for details).    COVID testing ordered. Decadron injection in office today. Symptomatic treatment discussed. Rx of doxycycline called into pharmacy, if symptoms not improving, can start to cover for bacterial infection. Other symptomatic treatment discussed. Return precautions given.  Final Clinical Impressions(s) / UC Diagnoses   Final diagnoses:  Encounter for screening for COVID-19  Acute non-recurrent pansinusitis   ED Prescriptions    Medication Sig Dispense Auth. Provider   doxycycline (VIBRAMYCIN) 100 MG capsule Take 1 capsule (100 mg total) by mouth 2 (two) times daily. 14 capsule Belia Febo V, PA-C   azelastine (ASTELIN) 0.1 % nasal spray Place 2 sprays into both nostrils 2 (two) times daily.  30 mL Ok Edwards, PA-C     PDMP not reviewed this encounter.   Ok Edwards, PA-C 04/14/20 1016

## 2020-04-16 LAB — NOVEL CORONAVIRUS, NAA: SARS-CoV-2, NAA: NOT DETECTED

## 2020-04-16 LAB — SARS-COV-2, NAA 2 DAY TAT

## 2020-04-27 ENCOUNTER — Other Ambulatory Visit: Payer: Self-pay

## 2020-04-27 MED ORDER — FLUTICASONE PROPIONATE HFA 220 MCG/ACT IN AERO: 1.0000 | INHALATION_SPRAY | Freq: Two times a day (BID) | RESPIRATORY_TRACT | 12 refills | Status: DC

## 2020-04-27 MED ORDER — PANTOPRAZOLE SODIUM 40 MG PO TBEC: 40.0000 mg | DELAYED_RELEASE_TABLET | Freq: Every day | ORAL | 3 refills | Status: DC

## 2020-04-27 NOTE — Telephone Encounter (Signed)
Patient with eosinophilic esophagitis with ongoing symptoms  I would recommend the following given that she has still having issues: Discontinue omeprazole Begin pantoprazole 40 mg once daily, 30 minutes to 1 hour before her first meal of the day Begin fluticasone 220 mcg/spray, twp sprays twice daily.  This is swallowed, not inhaled and she should not drink or eat for 30 min after taking this med. Give fluticasone for 4 to 8 weeks  Have her let me know in about a month how she is feeling Thanks

## 2020-04-28 NOTE — Telephone Encounter (Signed)
Dr Hilarie Fredrickson- Patient indicates Flovent will cost her $230 for a 1 month supply. She is willing to do this for short term but would like cheaper alternative if possible. Would budesonide slurry be appropriate?

## 2020-05-02 MED ORDER — AMBULATORY NON FORMULARY MEDICATION: 0 refills | Status: DC

## 2020-05-02 NOTE — Telephone Encounter (Signed)
Okay to substitute budesonide slurry per protocol x 4 to 8 weeks

## 2020-05-03 DIAGNOSIS — N951 Menopausal and female climacteric states: Secondary | ICD-10-CM | POA: Diagnosis not present

## 2020-05-22 ENCOUNTER — Ambulatory Visit: Payer: BC Managed Care – PPO | Admitting: Internal Medicine

## 2020-06-05 ENCOUNTER — Telehealth: Payer: Self-pay | Admitting: Internal Medicine

## 2020-06-05 NOTE — Telephone Encounter (Signed)
Pt needs prescription for allergy medication sent to [facility]. Pt stated that originally she was prescribed this allergy throat medication in oral suspension and it was sent to [facility] but it was too expensive so she did not take it. However, pt would like the same prescription sent to her pharmacy [facility]. Pls call her if in doubt.

## 2020-06-06 NOTE — Telephone Encounter (Signed)
Left message for patient to call back. [facility] does not compound budesonide. She must get this either at the compounding pharmacies we are already aware of or at another compounding pharmacy she has contacted that she is told DOES compound budesonide.

## 2020-06-07 MED ORDER — FLOVENT HFA 220 MCG/ACT IN AERO: INHALATION_SPRAY | RESPIRATORY_TRACT | 0 refills | Status: DC

## 2020-06-07 NOTE — Telephone Encounter (Signed)
I have spoken to patient who indicates that even the budesonide slurry was qouted to her from [facility] as being $92.92 for a 2 week supply. This is out of her price range especially since she needs a 6 week supply.   Any additional recommendations since she is unable to get budesonide slurry or flovent?  Of note: Patient complains of increased cough and hoarseness over the last couple of weeks (hx LPR). NO mentioned dysphagia.

## 2020-06-07 NOTE — Telephone Encounter (Signed)
Would [facility] be cheaper? [facility] or [facility] outpt? I am sorry there just isn't another option between swallowed fluticasone and budesonide slurry

## 2020-06-07 NOTE — Telephone Encounter (Signed)
Budesonide slurry is compounded so it is only available at certain pharmacies [facility], [facility] and [facility] are not pharmacies that compound). Flovent cost is going to be roughly the same at any pharmacy as this is her copay because of her insurance.   I have spoken to patient about this. She says she will go ahead and get the flovent. I have sent a new prescription to pharmacy for patient.

## 2020-06-07 NOTE — Telephone Encounter (Signed)
Pt wants original RX sent back to [facility] due to cost

## 2020-06-13 ENCOUNTER — Encounter: Payer: Self-pay | Admitting: Family Medicine

## 2020-06-13 DIAGNOSIS — H04123 Dry eye syndrome of bilateral lacrimal glands: Secondary | ICD-10-CM | POA: Diagnosis not present

## 2020-06-13 DIAGNOSIS — K200 Eosinophilic esophagitis: Secondary | ICD-10-CM

## 2020-06-13 DIAGNOSIS — H25811 Combined forms of age-related cataract, right eye: Secondary | ICD-10-CM | POA: Diagnosis not present

## 2020-06-22 ENCOUNTER — Encounter: Payer: Self-pay | Admitting: Allergy

## 2020-06-22 ENCOUNTER — Ambulatory Visit: Payer: BC Managed Care – PPO | Admitting: Allergy

## 2020-06-22 ENCOUNTER — Other Ambulatory Visit: Payer: Self-pay

## 2020-06-22 VITALS — BP 106/60 | HR 81 | Temp 98.0°F | Resp 17 | Ht 70.0 in | Wt 192.2 lb

## 2020-06-22 DIAGNOSIS — J301 Allergic rhinitis due to pollen: Secondary | ICD-10-CM | POA: Diagnosis not present

## 2020-06-22 DIAGNOSIS — K200 Eosinophilic esophagitis: Secondary | ICD-10-CM | POA: Diagnosis not present

## 2020-06-22 MED ORDER — EPINEPHRINE 0.3 MG/0.3ML IJ SOAJ: 0.3000 mg | Freq: Once | INTRAMUSCULAR | 1 refills | Status: AC

## 2020-06-22 MED ORDER — FLUTICASONE PROPIONATE 50 MCG/ACT NA SUSP
NASAL | 11 refills | Status: DC
Start: 2020-06-22 — End: 2021-01-11

## 2020-06-22 NOTE — Progress Notes (Signed)
Insurance codes have been mailed out. Patient is aware they are on the way. Called to confirm home address.

## 2020-06-22 NOTE — Progress Notes (Signed)
New Patient Note  RE: [name] MRN: [id number] DOB: [birth year] Date of Office Visit: 06/22/2020  Referring provider: Lesleigh Noe, MD Primary care provider: Lesleigh Noe, MD  Chief Complaint: eosinophilic esophagitis  History of present illness: [name] is a 59 y.o. female presenting today for consultation for eosinophilic esophagitis.   She is following with Dr. Hilarie Fredrickson, in GI for EOE.  It was recommended by him that she see an allergist for a food allergy evaluation.  She had a endoscopy in July 2021 where she did have esophageal dilation as well as a small hiatal hernia noted.  She states over the past decade or so she has had issues with swallowing pills and having nasal drainage and throat clearing.  She also states she has noted some swallowing difficulties with crumbly foods.  She states she did have Covid Oct/Nov 2020 and since has been having coughing fits and increase in thick nasal drainage and more throat clearing.  She states the drainage is so bad that it can take 1.5 hour of coughing in the morning to try to clear thick mucus.  She was started on Prilosec by PCP to see if it was silent reflux.  When this didn't help she was sent GI where she had endoscopy that per notes was consistent with EOE.  She was then started on swallowed Flovent which she does feel has helped.  She uses flovent swallowed twice a day.  She does feel the flovent is helping as she is not having the coughing fits.  She does still have the drainage.    She was taking zyrtec in AM and allegra in PM.  She has been off for past 3 days and states that her allergy symptoms are worse but her EOE symptoms have not worsened.  She does report she is having more drainage off antihistamines.   She had allergy testing about 10 years ago and reports was positive to several allergens.    She also has history of Hashimotos  She also reports having winter eczema.     Review of systems: Review  of Systems  Constitutional: Negative.   HENT:       See HPI  Eyes: Negative.   Respiratory: Negative.   Cardiovascular: Negative.   Gastrointestinal: Negative.   Musculoskeletal: Negative.   Skin: Negative.   Neurological: Negative.     All other systems negative unless noted above in HPI  Past medical history: Past Medical History:  Diagnosis Date  . Allergic rhinitis   . Anxiety   . Cervical dysplasia 1990   s/p conization  . Chronic sinusitis   . Depression   . Fibromyalgia   . Hiatal hernia   . Hip fracture, left ([place]) 2005   s/p ORIF  . Hypothyroidism   . Migraine   . Neuropathy   . Schatzki's ring     Past surgical history: Past Surgical History:  Procedure Laterality Date  . BREAST ENHANCEMENT SURGERY  2007  . BREAST IMPLANT REMOVAL Bilateral 05/21/2018  . DILATION AND CURETTAGE OF UTERUS  1989  . ORIF HIP FRACTURE  2005   Daldorf  . TONSILLECTOMY AND ADENOIDECTOMY    . TOTAL HIP ARTHROPLASTY  2005    Family history:  Family History  Problem Relation Age of Onset  . Arthritis/Rheumatoid Mother        RA and OA  . Breast cancer Maternal Aunt 48  . Irregular heart beat Father  need ablation  . Basal cell carcinoma Father   . Arthritis Maternal Grandmother   . Heart disease Maternal Grandmother   . Lung cancer Paternal Grandfather   . Colon cancer Neg Hx   . Pancreatic cancer Neg Hx   . Stomach cancer Neg Hx   . Esophageal cancer Neg Hx   . Rectal cancer Neg Hx     Social history: She lives in a home with carpeting in the bedroom with electric heating and central cooling.  2 dogs in the home and 1 cat.  There is no concern for roaches in the home.  She is retired.  She denies a smoking history.  [place]  Medication List: Current Outpatient Medications  Medication Sig Dispense Refill  . albuterol (VENTOLIN HFA) 108 (90 Base) MCG/ACT inhaler Inhale 2 puffs into the lungs every 4  (four) hours as needed for wheezing or shortness of breath. 18 g 0  . AMBULATORY NON FORMULARY MEDICATION Medication Name: Budesonide Slurry 2mg /37ml -- Swallow 1 teaspoon (5 ml) twice daily x 8 weeks. NPO 30 minutes after taking this medication. 1200 mL 0  . ARMOUR THYROID 60 MG tablet TAKE 1 TABLET BY MOUTH DAILY BEFORE BREAKFAST 90 tablet 3  . cetirizine (ZYRTEC) 10 MG tablet Take 10 mg by mouth at bedtime.    . Cholecalciferol (VITAMIN D PO) Take by mouth daily.    . DULoxetine (CYMBALTA) 60 MG capsule Take 1 capsule (60 mg total) by mouth daily. 90 capsule 3  . fexofenadine (ALLEGRA) 60 MG tablet Take 60 mg by mouth daily.    . fluticasone (FLONASE) 50 MCG/ACT nasal spray USE 2 SPRAYS INTO EACH NOSTRIL DAILY 16 g 11  . fluticasone (FLOVENT HFA) 220 MCG/ACT inhaler Swallow (DO NOT inhale) 2 puffs twice daily x 6 weeks. Rinse mouth afterwards. Do not eat or drink anything 30 minutes before or after taking this medication. 1 each 0  . GAMMA AMINOBUTYRIC ACID PO Take 750 mg by mouth daily.    . hydrOXYzine (ATARAX/VISTARIL) 50 MG tablet Take 1 tablet (50 mg total) by mouth every 6 (six) hours as needed. 30 tablet 0  . Magnesium Citrate 100 MG TABS Take 200 mg by mouth daily.    . meclizine (ANTIVERT) 25 MG tablet Take 1 tablet (25 mg total) by mouth 3 (three) times daily as needed for dizziness. 30 tablet 0  . Melatonin 10 MG TABS Take 10 mg by mouth at bedtime.    . methocarbamol (ROBAXIN) 500 MG tablet Take 1 tablet (500 mg total) by mouth 2 (two) times daily as needed for muscle spasms. 60 tablet 1  . Multiple Vitamin (MULTIVITAMIN) capsule Take 1 capsule by mouth daily.      . Omega-3 Fatty Acids (FISH OIL PO) Take by mouth daily.    Marland Kitchen omeprazole (PRILOSEC) 40 MG capsule Take 1 capsule (40 mg total) by mouth daily. 90 capsule 4  . OVER THE COUNTER MEDICATION CBD Oil    . OVER THE COUNTER MEDICATION Seleium 200 mg    . SUMAtriptan (IMITREX) 50 MG tablet Take 1 tablet (50 mg total) by mouth  every 2 (two) hours as needed for migraine. May repeat in 2 hours if headache persists or recurs. 10 tablet 5  . TURMERIC CURCUMIN PO Take 1 tablet by mouth daily.    . vitamin E 400 UNIT capsule Take 400 Units by mouth daily.    Marland Kitchen ALPRAZolam (  XANAX) 0.25 MG tablet Take 1 tid prn (Patient not taking: Reported on 06/22/2020) 90 tablet 5  . azelastine (ASTELIN) 0.1 % nasal spray Place 2 sprays into both nostrils 2 (two) times daily. (Patient not taking: Reported on 06/22/2020) 30 mL 0  . baclofen (LIORESAL) 10 MG tablet as needed.  (Patient not taking: Reported on 06/22/2020)    . doxycycline (VIBRAMYCIN) 100 MG capsule Take 1 capsule (100 mg total) by mouth 2 (two) times daily. (Patient not taking: Reported on 06/22/2020) 14 capsule 0  . EPINEPHrine (AUVI-Q) 0.3 mg/0.3 mL IJ SOAJ injection Inject 0.3 mg into the muscle once for 1 dose. As directed for life-threatening allergic reactions 2 each 1  . omeprazole (PRILOSEC) 20 MG capsule TAKE 1 CAPSULE BY MOUTH DAILY 90 capsule 0  . pantoprazole (PROTONIX) 40 MG tablet Take 1 tablet (40 mg total) by mouth daily. (Patient not taking: Reported on 06/22/2020) 90 tablet 3  . polyethylene glycol (MIRALAX) 17 g packet Miralax    . Wheat Dextrin (BENEFIBER DRINK MIX PO) Benefiber     No current facility-administered medications for this visit.    Known medication allergies: No Known Allergies   Physical examination: Blood pressure 106/60, pulse 81, temperature 98 F (36.7 C), temperature source Temporal, resp. rate 17, height 5\' 10"  (1.778 m), weight 192 lb 3.2 oz (87.2 kg), last menstrual period 07/05/2019, SpO2 98 %.  General: Alert, interactive, in no acute distress. HEENT: PERRLA, TMs pearly gray, turbinates non-edematous without discharge, post-pharynx non erythematous. Neck: Supple without lymphadenopathy. Lungs: Clear to auscultation without wheezing, rhonchi or rales. {no increased work of breathing. CV: Normal S1, S2 without  murmurs. Abdomen: Nondistended, nontender. Skin: Warm and dry, without lesions or rashes. Extremities:  No clubbing, cyanosis or edema. Neuro:   Grossly intact.  Diagnositics/Labs:  Allergy testing: Environmental allergy skin prick testing is positive to grass pollens, weed pollens, tree pollens.  Intradermal testing is negative. Food allergy skin prick testing is positive to wheat and mushroom. Allergy testing results were read and interpreted by provider, documented by clinical staff.   Assessment and plan:   Eosinophilic esophagitis Sister has concomitant perennial and seasonal allergic rhinitis.  Food allergen skin prick tests is positive to wheat and mushrooms.  The negative predictive value for food allergen skin testing is excellent, with the exception of milk. However, false negatives may occur, particularly with milk. Recommend trial of wheat and mushroom avoidance in the diet for 2-4 weeks to see if this improves EOE symptoms.  If it does then keep these foods out of the diet.  If you don't notice any improvement in symptoms then you can reintroduce wheat and mushroom back into diet 1 at a time.   If symptoms persist or progress despite above food avoidance, a trial four food (milk, soy, eggs, wheat) or six food elimination diet (milk, soy, eggs, wheat, peanuts/tree nuts, and seafood), or at least a trial elimination of milk, is a reasonable consideration.  Allergen avoidance measures (as above) and will prescribe epinephrine device for you to have in case of allergic reaction and follow emergency action plan.  Continue swallowed Flovent 2 puffs twice a day.  Will discuss new therapies in the future as they get approved for use  Follow up with Dr. Hilarie Fredrickson as scheduled for monitoring.  Allergic rhinitis   environmental allergy testing is positive to grasses, weeds, trees  Allergen avoidance measures discussed/handouts provided  Use Astelin (Azelastine) 2 sprays each nostril  twice a day for nasal  drainage control as needed  Can use Flonase 2 sprays each nostril daily for 1-2 weeks at a time for maximum benefit for nasal congestion.  At this time you have no significant component of congestion thus Flonase is likely not providing much benefit  Can continue Zyrtec and Allegra for symptom control.  If Astelin above helps with nasal drainage control then stop one of the antihistamines.   Recommend use of nasal saline rinse to help flush/clean out the sinuses.  Rinses can help your medicated nose sprays work more effectively too allergen immunotherapy discussed today including protocol, benefits and risk.  Informational handout provided.  If interested in this therapuetic option you can check with your insurance carrier for coverage.  Let us know if you would like to proceed with this option.    Follow-up in 6 months or sooner if needed    I appreciate the opportunity to take part in [name]'s care. Please do not hesitate to contact me with questions.  Sincerely,   Prudy Feeler, MD Allergy/Immunology [facility]

## 2020-06-22 NOTE — Patient Instructions (Addendum)
Eosinophilic esophagitis [name] has concomitant perennial and seasonal allergic rhinitis.  Food allergen skin prick tests is positive to wheat and mushrooms.  The negative predictive value for food allergen skin testing is excellent, with the exception of milk. However, false negatives may occur, particularly with milk. Recommend trial of wheat and mushroom avoidance in the diet for 2-4 weeks to see if this improves EOE symptoms.  If it does then keep these foods out of the diet.  If you don't notice any improvement in symptoms then you can reintroduce wheat and mushroom back into diet 1 at a time.   If symptoms persist or progress despite above food avoidance, a trial four food (milk, soy, eggs, wheat) or six food elimination diet (milk, soy, eggs, wheat, peanuts/tree nuts, and seafood), or at least a trial elimination of milk, is a reasonable consideration.  Allergen avoidance measures (as above) and will prescribe epinephrine device for you to have in case of allergic reaction and follow emergency action plan.  Continue swallowed Flovent 2 puffs twice a day.  Will discuss new therapies in the future as they get approved for use  Follow up with Dr. Hilarie Fredrickson as scheduled for monitoring.  Environmental allergies   environmental allergy testing is positive to grasses, weeds, trees  Allergen avoidance measures discussed/handouts provided  Use Astelin (Azelastine) 2 sprays each nostril twice a day for nasal drainage control as needed  Can use Flonase 2 sprays each nostril daily for 1-2 weeks at a time for maximum benefit for nasal congestion.  At this time you have no significant component of congestion thus Flonase is likely not providing much benefit  Can continue Zyrtec and Allegra for symptom control.  If Astelin above helps with nasal drainage control then stop one of the antihistamines.   Recommend use of nasal saline rinse to help flush/clean out the sinuses.  Rinses can help your  medicated nose sprays work more effectively too allergen immunotherapy discussed today including protocol, benefits and risk.  Informational handout provided.  If interested in this therapuetic option you can check with your insurance carrier for coverage.  Let us know if you would like to proceed with this option.    Follow-up in 6 months or sooner if needed

## 2020-06-27 ENCOUNTER — Telehealth: Payer: Self-pay | Admitting: *Deleted

## 2020-06-27 NOTE — Telephone Encounter (Signed)
PA has been submitted through CoverMyMeds for Fluticasone and is currently pending approval or denial.

## 2020-06-28 NOTE — Telephone Encounter (Signed)
PA was denied for Flonase stating that since it is an over the counter medication it must be purchased over the counter. Called and left a voicemail asking for patient to call back to inform.

## 2020-06-28 NOTE — Telephone Encounter (Signed)
Patient called back and was advised about medication. Patient verbalized understanding and will continue to purchase over the counter.

## 2020-07-12 NOTE — Telephone Encounter (Signed)
Please let pt know that I received her email.  I assume she is on pantoprazole daily (and not omeprazole), as both remain listed on her med list She did have EoE inflammation by biopsy earlier this year.  This likely is reflux and allergy driven and explains the mild dysphagia (crumbly foods) symptom she had previously. I am not convinced and do not really think her EoE is driving the LPR symptoms, specifically throat clearing and cough.   If these symptoms are GI (and I say if because possible this is allergic) then perhaps her reflux is not completely controlled thus driving the cough and throat clearing. Thus, I would not go to budesonide or more fluticasone now. Let's see how symptoms change when fluticasone stops.   In order to objectively determine if GERD is driving throat symptoms/cough we may consider repeat EGD and Bravo capsule.  This would measure pH and reflux x 48 hours and we could determine objective if reflux is driving these symptoms.  Repeat EGD would also allow for repeat bx to determine if EoE is in remission. If she wishes to discuss more then can schedule OV Let me know if she has ? Thanks [employer]

## 2020-07-14 DIAGNOSIS — Z1231 Encounter for screening mammogram for malignant neoplasm of breast: Secondary | ICD-10-CM | POA: Diagnosis not present

## 2020-07-14 DIAGNOSIS — Z6828 Body mass index (BMI) 28.0-28.9, adult: Secondary | ICD-10-CM | POA: Diagnosis not present

## 2020-07-14 DIAGNOSIS — Z01419 Encounter for gynecological examination (general) (routine) without abnormal findings: Secondary | ICD-10-CM | POA: Diagnosis not present

## 2020-07-14 DIAGNOSIS — Z13820 Encounter for screening for osteoporosis: Secondary | ICD-10-CM | POA: Diagnosis not present

## 2020-07-14 LAB — HM MAMMOGRAPHY

## 2020-07-14 LAB — HM DEXA SCAN

## 2020-07-14 LAB — RESULTS CONSOLE HPV: CHL HPV: NEGATIVE

## 2020-07-18 LAB — HM PAP SMEAR: HM Pap smear: NEGATIVE

## 2020-09-08 ENCOUNTER — Other Ambulatory Visit: Payer: Self-pay

## 2020-09-08 MED ORDER — ARMOUR THYROID 60 MG PO TABS: ORAL_TABLET | ORAL | 3 refills | Status: DC

## 2020-09-27 ENCOUNTER — Encounter: Payer: Self-pay | Admitting: Family Medicine

## 2020-09-27 ENCOUNTER — Ambulatory Visit: Payer: BC Managed Care – PPO | Admitting: Family Medicine

## 2020-09-27 ENCOUNTER — Other Ambulatory Visit: Payer: Self-pay

## 2020-09-27 ENCOUNTER — Ambulatory Visit (INDEPENDENT_AMBULATORY_CARE_PROVIDER_SITE_OTHER)
Admission: RE | Admit: 2020-09-27 | Discharge: 2020-09-27 | Disposition: A | Discharge status: Home / Self Care | Payer: BC Managed Care – PPO | Source: Ambulatory Visit | Attending: Family Medicine | Admitting: Family Medicine

## 2020-09-27 VITALS — BP 110/72 | HR 76 | Temp 98.2°F | Ht 69.75 in | Wt 195.5 lb

## 2020-09-27 DIAGNOSIS — K200 Eosinophilic esophagitis: Secondary | ICD-10-CM

## 2020-09-27 DIAGNOSIS — R682 Dry mouth, unspecified: Secondary | ICD-10-CM

## 2020-09-27 DIAGNOSIS — H04123 Dry eye syndrome of bilateral lacrimal glands: Secondary | ICD-10-CM

## 2020-09-27 DIAGNOSIS — T691XXA Chilblains, initial encounter: Secondary | ICD-10-CM | POA: Insufficient documentation

## 2020-09-27 DIAGNOSIS — E063 Autoimmune thyroiditis: Secondary | ICD-10-CM

## 2020-09-27 DIAGNOSIS — E038 Other specified hypothyroidism: Secondary | ICD-10-CM

## 2020-09-27 DIAGNOSIS — M2550 Pain in unspecified joint: Secondary | ICD-10-CM

## 2020-09-27 DIAGNOSIS — R062 Wheezing: Secondary | ICD-10-CM | POA: Insufficient documentation

## 2020-09-27 DIAGNOSIS — R21 Rash and other nonspecific skin eruption: Secondary | ICD-10-CM | POA: Diagnosis not present

## 2020-09-27 LAB — HIGH SENSITIVITY CRP: CRP, High Sensitivity: 2.03 mg/L (ref 0.000–5.000)

## 2020-09-27 LAB — SEDIMENTATION RATE: Sed Rate: 9 mm/hr (ref 0–30)

## 2020-09-27 LAB — COMPREHENSIVE METABOLIC PANEL
ALT: 12 U/L (ref 0–35)
AST: 16 U/L (ref 0–37)
Albumin: 4 g/dL (ref 3.5–5.2)
Alkaline Phosphatase: 76 U/L (ref 39–117)
BUN: 19 mg/dL (ref 6–23)
CO2: 29 mEq/L (ref 19–32)
Calcium: 9.5 mg/dL (ref 8.4–10.5)
Chloride: 105 mEq/L (ref 96–112)
Creatinine, Ser: 0.94 mg/dL (ref 0.40–1.20)
GFR: 67.23 mL/min (ref >60.00)
Glucose, Bld: 91 mg/dL (ref 70–99)
Potassium: 4.4 mEq/L (ref 3.5–5.1)
Sodium: 138 mEq/L (ref 135–145)
Total Bilirubin: 0.4 mg/dL (ref 0.2–1.2)
Total Protein: 7.4 g/dL (ref 6.0–8.3)

## 2020-09-27 LAB — TSH: TSH: 0.72 u[IU]/mL (ref 0.35–4.50)

## 2020-09-27 NOTE — Assessment & Plan Note (Signed)
Overdue for TSH check. Labs today. Cont Armour thyroid 60 mg pending results

## 2020-09-27 NOTE — Assessment & Plan Note (Signed)
Pt with known hashimoto disease, family hx of RA, and presenting with several complaints of bilateral joint pain, dry mouth/eyes, and facial rash (resolved). Facial rash not present on exam but given personal and family history and description could be consistent with lupus rash. Will get labs to further evaluate

## 2020-09-27 NOTE — Assessment & Plan Note (Signed)
Noted on lung exam today. She is ~6 weeks out of recent covid infection so may just be recovery. Will get CXR to evaluate. No SOB, but daily cough in setting of eosinophilic esophagitis.

## 2020-09-27 NOTE — Assessment & Plan Note (Signed)
Reviewed GI and allergy notes. Per patient no response to any treatment. Advised elimination diet as discussed in allergy note. And advised reaching out to allergist to see if any new treatment options available if not improving.

## 2020-09-27 NOTE — Assessment & Plan Note (Signed)
Toe finding seems consistent with pernio. Discussed this could be related to recent covid infection. Mychart to advise keeping warm and possible sign of lupus.

## 2020-09-27 NOTE — Progress Notes (Signed)
Subjective:     [name] is a 60 y.o. female presenting for esophagitis (Would like to discuss options ) and autoimmune concerns (New rash on face )     HPI  #Face Rash - early to mid December - comes and goes - has hashimoto's  - has dry eyes, dry mouth  - getting flushing normally - got a flushing rash on the bilateral cheeks  - nothing on the chin - toe with burning and itching - no pain - erythema on the toe - mom with RA  Rash looked like blood vessel appearance  Not itchy No raised lesions  Has had covid x 2  Most recently before christmas  #Esophagitis  - eosinophilic esophagitis - nothing seems to have helped - has tried multiple medications w/o improvement - continues to take 2 reflux medications - still very productive mucus  - denies sinus symptoms  - coughing always in the morning and after drinking coffee  Review of Systems   Social History   Tobacco Use  Smoking Status Never Smoker  Smokeless Tobacco Never Used        Objective:    BP Readings from Last 3 Encounters:  09/27/20 110/72  06/22/20 106/60  04/14/20 121/68   Wt Readings from Last 3 Encounters:  09/27/20 195 lb 8 oz (88.7 kg)  06/22/20 192 lb 3.2 oz (87.2 kg)  02/29/20 180 lb (81.6 kg)    BP 110/72   Pulse 76   Temp 98.2 F (36.8 C) (Temporal)   Ht 5' 9.75" (1.772 m)   Wt 195 lb 8 oz (88.7 kg)   LMP 07/05/2019   SpO2 98%   BMI 28.25 kg/m    Physical Exam Constitutional:      General: She is not in acute distress.    Appearance: She is well-developed. She is not diaphoretic.  HENT:     Right Ear: External ear normal.     Left Ear: External ear normal.     Nose: Nose normal.  Eyes:     Conjunctiva/sclera: Conjunctivae normal.  Cardiovascular:     Rate and Rhythm: Normal rate and regular rhythm.     Heart sounds: No murmur heard.   Pulmonary:     Effort: Pulmonary effort is normal. No respiratory distress.     Breath sounds: Wheezing (b/l upper  lung fields) present. No rales.  Musculoskeletal:     Cervical back: Neck supple.  Skin:    General: Skin is warm and dry.     Capillary Refill: Capillary refill takes less than 2 seconds.     Comments: Bilateral erythema on cheeks. Pt notes rash comes and goes. Left foot with violaceous edematous papule on the 2nd toe  Neurological:     Mental Status: She is alert. Mental status is at baseline.  Psychiatric:        Mood and Affect: Mood normal.        Behavior: Behavior normal.           Assessment & Plan:   Problem List Items Addressed This Visit      Digestive   Dry mouth    May be 2/2 to thyroid. But in setting of dry eyes will evaluate for Sjogren's disease.       Relevant Orders   ANA   Sedimentation rate   High sensitivity CRP   Comprehensive metabolic panel   POCT UA - Microscopic Only   Rheumatoid factor   Sjogren's syndrome antibods(ssa +  ssb)   Eosinophilic esophagitis    Reviewed GI and allergy notes. Per patient no response to any treatment. Advised elimination diet as discussed in allergy note. And advised reaching out to allergist to see if any new treatment options available if not improving.         Endocrine   Hypothyroidism due to Hashimoto's thyroiditis - Primary    Overdue for TSH check. Labs today. Cont Armour thyroid 60 mg pending results      Relevant Orders   TSH     Musculoskeletal and Integument   Pernio, initial encounter    Toe finding seems consistent with pernio. Discussed this could be related to recent covid infection. Mychart to advise keeping warm and possible sign of lupus.         Other   Polyarthralgia    Pt notes pain and swelling in b/l hands. 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 With constellation of symptoms will get labs to evaluate for autoimmune causes.       Relevant Orders   ANA   Sedimentation rate   High sensitivity CRP   Comprehensive metabolic panel   POCT UA - Microscopic Only   Rheumatoid  factor   Sjogren's syndrome antibods(ssa + ssb)   Wheezing    Noted on lung exam today. She is ~6 weeks out of recent covid infection so may just be recovery. Will get CXR to evaluate. No SOB, but daily cough in setting of eosinophilic esophagitis.       Relevant Orders   DG Chest 2 View   Malar rash    Pt with known hashimoto disease, family hx of RA, and presenting with several complaints of bilateral joint pain, dry mouth/eyes, and facial rash (resolved). Facial rash not present on exam but given personal and family history and description could be consistent with lupus rash. Will get labs to further evaluate      Relevant Orders   ANA   Sedimentation rate   High sensitivity CRP   Comprehensive metabolic panel   POCT UA - Microscopic Only   Rheumatoid factor    Other Visit Diagnoses    Dry eyes       Relevant Orders   ANA   Sedimentation rate   High sensitivity CRP   Comprehensive metabolic panel   POCT UA - Microscopic Only   Rheumatoid factor   Sjogren's syndrome antibods(ssa + ssb)       Return if symptoms worsen or fail to improve.  Lesleigh Noe, MD  This visit occurred during the SARS-CoV-2 public health emergency.  Safety protocols were in place, including screening questions prior to the visit, additional usage of staff PPE, and extensive cleaning of exam room while observing appropriate contact time as indicated for disinfecting solutions.

## 2020-09-27 NOTE — Assessment & Plan Note (Signed)
May be 2/2 to thyroid. But in setting of dry eyes will evaluate for Sjogren's disease.

## 2020-09-27 NOTE — Patient Instructions (Addendum)
If symptoms persist or progress despite above food avoidance (wheat/mushrooms), a trial four food (milk, soy, eggs, wheat) or six food elimination diet (milk, soy, eggs, wheat, peanuts/tree nuts, and seafood), or at least a trial elimination of milk, is a reasonable consideration.  Whole 30 diet - is also consideration   Blood work today

## 2020-09-27 NOTE — Assessment & Plan Note (Signed)
Pt notes pain and swelling in b/l hands. 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 With constellation of symptoms will get labs to evaluate for autoimmune causes.

## 2020-09-29 LAB — ANA: Anti Nuclear Antibody (ANA): POSITIVE — AB

## 2020-09-29 LAB — SJOGREN'S SYNDROME ANTIBODS(SSA + SSB)
SSA (Ro) (ENA) Antibody, IgG: <1 AI
SSB (La) (ENA) Antibody, IgG: <1 AI

## 2020-09-29 LAB — RHEUMATOID FACTOR: Rheumatoid fact SerPl-aCnc: <14 IU/mL (ref <14)

## 2020-09-29 LAB — ANTI-NUCLEAR AB-TITER (ANA TITER): ANA Titer 1: 1:320 {titer} — ABNORMAL HIGH

## 2020-10-02 ENCOUNTER — Other Ambulatory Visit: Payer: Self-pay | Admitting: Family Medicine

## 2020-10-02 ENCOUNTER — Encounter: Payer: Self-pay | Admitting: Family Medicine

## 2020-10-02 DIAGNOSIS — R21 Rash and other nonspecific skin eruption: Secondary | ICD-10-CM

## 2020-10-02 DIAGNOSIS — T691XXA Chilblains, initial encounter: Secondary | ICD-10-CM

## 2020-10-02 DIAGNOSIS — M2550 Pain in unspecified joint: Secondary | ICD-10-CM

## 2020-10-02 DIAGNOSIS — R768 Other specified abnormal immunological findings in serum: Secondary | ICD-10-CM
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
## 2020-10-09 ENCOUNTER — Encounter: Payer: Self-pay | Admitting: Rheumatology

## 2020-10-09 ENCOUNTER — Ambulatory Visit: Payer: Self-pay

## 2020-10-09 ENCOUNTER — Ambulatory Visit: Payer: BC Managed Care – PPO | Admitting: Rheumatology

## 2020-10-09 ENCOUNTER — Encounter: Payer: Self-pay | Admitting: Family Medicine

## 2020-10-09 ENCOUNTER — Other Ambulatory Visit: Payer: Self-pay

## 2020-10-09 VITALS — BP 125/88 | HR 83 | Resp 14 | Ht 69.25 in | Wt 197.8 lb

## 2020-10-09 DIAGNOSIS — E063 Autoimmune thyroiditis: Secondary | ICD-10-CM

## 2020-10-09 DIAGNOSIS — F411 Generalized anxiety disorder: Secondary | ICD-10-CM

## 2020-10-09 DIAGNOSIS — K200 Eosinophilic esophagitis: Secondary | ICD-10-CM

## 2020-10-09 DIAGNOSIS — Z96642 Presence of left artificial hip joint: Secondary | ICD-10-CM | POA: Diagnosis not present

## 2020-10-09 DIAGNOSIS — R768 Other specified abnormal immunological findings in serum: Secondary | ICD-10-CM | POA: Diagnosis not present

## 2020-10-09 DIAGNOSIS — R21 Rash and other nonspecific skin eruption: Secondary | ICD-10-CM | POA: Diagnosis not present

## 2020-10-09 DIAGNOSIS — M79642 Pain in left hand: Secondary | ICD-10-CM

## 2020-10-09 DIAGNOSIS — E559 Vitamin D deficiency, unspecified: Secondary | ICD-10-CM

## 2020-10-09 DIAGNOSIS — M797 Fibromyalgia: Secondary | ICD-10-CM

## 2020-10-09 DIAGNOSIS — E038 Other specified hypothyroidism: Secondary | ICD-10-CM

## 2020-10-09 DIAGNOSIS — R634 Abnormal weight loss: Secondary | ICD-10-CM

## 2020-10-09 DIAGNOSIS — R5383 Other fatigue: Secondary | ICD-10-CM | POA: Diagnosis not present

## 2020-10-09 DIAGNOSIS — H8111 Benign paroxysmal vertigo, right ear: Secondary | ICD-10-CM

## 2020-10-09 DIAGNOSIS — M79641 Pain in right hand: Secondary | ICD-10-CM

## 2020-10-09 DIAGNOSIS — M2240 Chondromalacia patellae, unspecified knee: Secondary | ICD-10-CM

## 2020-10-09 DIAGNOSIS — Z8659 Personal history of other mental and behavioral disorders: Secondary | ICD-10-CM

## 2020-10-09 DIAGNOSIS — U071 COVID-19: Secondary | ICD-10-CM

## 2020-10-09 DIAGNOSIS — M2550 Pain in unspecified joint: Secondary | ICD-10-CM

## 2020-10-09 DIAGNOSIS — G43801 Other migraine, not intractable, with status migrainosus: Secondary | ICD-10-CM

## 2020-10-09 DIAGNOSIS — T691XXA Chilblains, initial encounter: Secondary | ICD-10-CM

## 2020-10-09 DIAGNOSIS — G6289 Other specified polyneuropathies: Secondary | ICD-10-CM

## 2020-10-09 DIAGNOSIS — Z8261 Family history of arthritis: Secondary | ICD-10-CM

## 2020-10-09 DIAGNOSIS — E538 Deficiency of other specified B group vitamins: Secondary | ICD-10-CM

## 2020-10-09 MED ORDER — AZELASTINE HCL 0.1 % NA SOLN: 1.0000 | Freq: Two times a day (BID) | NASAL | 12 refills | Status: DC

## 2020-10-09 NOTE — Telephone Encounter (Signed)
Please asked the phlebotomist of Lyme test can be added and notify patient.

## 2020-10-10 NOTE — Progress Notes (Signed)
I will discuss results at the follow-up visit.

## 2020-10-11 LAB — URINALYSIS, ROUTINE W REFLEX MICROSCOPIC
Bilirubin Urine: NEGATIVE
Glucose, UA: NEGATIVE
Hgb urine dipstick: NEGATIVE
Ketones, ur: NEGATIVE
Leukocytes,Ua: NEGATIVE
Nitrite: NEGATIVE
Protein, ur: NEGATIVE
Specific Gravity, Urine: 1.025 (ref 1.001–1.03)
pH: 5.5 (ref 5.0–8.0)

## 2020-10-11 LAB — CBC WITH DIFFERENTIAL/PLATELET
Absolute Monocytes: 636 cells/uL (ref 200–950)
Basophils Absolute: 78 cells/uL (ref 0–200)
Basophils Relative: 1.3 %
Eosinophils Absolute: 270 cells/uL (ref 15–500)
Eosinophils Relative: 4.5 %
HCT: 40.4 % (ref 35.0–45.0)
Hemoglobin: 13.6 g/dL (ref 11.7–15.5)
Lymphs Abs: 1776 cells/uL (ref 850–3900)
MCH: 30.3 pg (ref 27.0–33.0)
MCHC: 33.7 g/dL (ref 32.0–36.0)
MCV: 90 fL (ref 80.0–100.0)
MPV: 10.3 fL (ref 7.5–12.5)
Monocytes Relative: 10.6 %
Neutro Abs: 3240 cells/uL (ref 1500–7800)
Neutrophils Relative %: 54 %
Platelets: 307 10*3/uL (ref 140–400)
RBC: 4.49 10*6/uL (ref 3.80–5.10)
RDW: 13.1 % (ref 11.0–15.0)
Total Lymphocyte: 29.6 %
WBC: 6 10*3/uL (ref 3.8–10.8)

## 2020-10-11 LAB — GLUCOSE 6 PHOSPHATE DEHYDROGENASE: G-6PDH: 13.6 U/g Hgb (ref 7.0–20.5)

## 2020-10-11 LAB — COMPLETE METABOLIC PANEL WITH GFR
AG Ratio: 1.2 (calc) (ref 1.0–2.5)
ALT: 13 U/L (ref 6–29)
AST: 17 U/L (ref 10–35)
Albumin: 4.2 g/dL (ref 3.6–5.1)
Alkaline phosphatase (APISO): 78 U/L (ref 37–153)
BUN/Creatinine Ratio: 19 (calc) (ref 6–22)
BUN: 22 mg/dL (ref 7–25)
CO2: 26 mmol/L (ref 20–32)
Calcium: 9.6 mg/dL (ref 8.6–10.4)
Chloride: 106 mmol/L (ref 98–110)
Creat: 1.14 mg/dL — ABNORMAL HIGH (ref 0.50–1.05)
GFR, Est African American: 62 mL/min/{1.73_m2} (ref >=60)
GFR, Est Non African American: 53 mL/min/{1.73_m2} — ABNORMAL LOW (ref >=60)
Globulin: 3.5 g/dL (calc) (ref 1.9–3.7)
Glucose, Bld: 91 mg/dL (ref 65–99)
Potassium: 4.5 mmol/L (ref 3.5–5.3)
Sodium: 139 mmol/L (ref 135–146)
Total Bilirubin: 0.4 mg/dL (ref 0.2–1.2)
Total Protein: 7.7 g/dL (ref 6.1–8.1)

## 2020-10-11 LAB — B. BURGDORFI ANTIBODIES: B burgdorferi Ab IgG+IgM: <0.9 index

## 2020-10-11 LAB — CK: Total CK: 54 U/L (ref 29–143)

## 2020-10-12 DIAGNOSIS — R21 Rash and other nonspecific skin eruption: Secondary | ICD-10-CM | POA: Diagnosis not present

## 2020-10-12 DIAGNOSIS — N951 Menopausal and female climacteric states: Secondary | ICD-10-CM | POA: Diagnosis not present

## 2020-10-12 DIAGNOSIS — M8580 Other specified disorders of bone density and structure, unspecified site: Secondary | ICD-10-CM | POA: Diagnosis not present

## 2020-10-12 DIAGNOSIS — R5383 Other fatigue: Secondary | ICD-10-CM | POA: Diagnosis not present

## 2020-10-12 DIAGNOSIS — R768 Other specified abnormal immunological findings in serum: Secondary | ICD-10-CM | POA: Diagnosis not present

## 2020-10-18 NOTE — Telephone Encounter (Signed)
Please let patient know that we can certainly try the budesonide slurry from [facility].  I do not think budesonide vials are available for her to do this at home  Once she does the budesonide it would be reasonable for [place] to meet again either by virtual visit or in person.  If symptoms persist after therapy we may consider repeat EGD and biopsy to reassess EoE

## 2020-10-19 ENCOUNTER — Other Ambulatory Visit: Payer: Self-pay

## 2020-10-19 MED ORDER — BUDESONIDE 1 MG/2ML IN SUSP: RESPIRATORY_TRACT | 0 refills | Status: DC

## 2020-10-19 NOTE — Telephone Encounter (Signed)
[facility] for to send this Rx  60 vials of 1.0mg  Pulmicort respules. Mix 1 vial in the a.m. and p.m. with either applesauce or honey ( 1 teaspoon) & swallow. Do not eat or drink for 1 hour.  2mg  per 10 mL slurry Dose is 5 mL BID x 4-6 weeks
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
## 2020-10-29 ENCOUNTER — Ambulatory Visit
Admission: EM | Admit: 2020-10-29 | Discharge: 2020-10-29 | Disposition: A | Discharge status: Home / Self Care | Payer: BC Managed Care – PPO | Attending: Emergency Medicine | Admitting: Emergency Medicine

## 2020-10-29 ENCOUNTER — Other Ambulatory Visit: Payer: Self-pay

## 2020-10-29 DIAGNOSIS — M25531 Pain in right wrist: Secondary | ICD-10-CM | POA: Diagnosis not present

## 2020-10-29 MED ORDER — NAPROXEN 500 MG PO TABS: 500.0000 mg | ORAL_TABLET | Freq: Two times a day (BID) | ORAL | 0 refills | Status: DC

## 2020-10-29 MED ORDER — CEPHALEXIN 500 MG PO CAPS: 500.0000 mg | ORAL_CAPSULE | Freq: Four times a day (QID) | ORAL | 0 refills | Status: DC

## 2020-10-29 MED ORDER — TRIAMCINOLONE ACETONIDE 0.1 % EX CREA: 1.0000 "application " | TOPICAL_CREAM | Freq: Two times a day (BID) | CUTANEOUS | 0 refills | Status: DC

## 2020-10-29 NOTE — ED Triage Notes (Signed)
Pt c/o pain to right wrist area onset last night after working outside yesterday and being scratched by unknown thorny vine.   Pt states she had a "reaction" of significant pain to right wrist radiating to right thumb. Pt has recently been tested for "joint" and immune dysfunction such as SLE, RA, Lyme's disease. Pt reports recent dx of eosinophil esophagitis.   Pt denies facial swelling, SOB, n/v.  Small laceration/scratch to right radial area with small area of erythema surrounding site.

## 2020-10-29 NOTE — ED Provider Notes (Signed)
[name]    CSN: [id number] Arrival date & time: 10/29/20  1025      History   Chief Complaint Chief Complaint  Patient presents with  . Abrasion    HPI [name] is a 60 y.o. female presenting today for evaluation of wrist pain and discoloration.  Patient reports that yesterday after working out in the yard she noticed an area to her right wrist that developed redness and increased pain.  She reports that she was removing thorny bushes and has rosebushes that often she gets scratched by thorns.  After working in the yard yesterday she noticed an area to her right wrist that developed increased pain swelling and redness.  Reports pain feels deeper than just on the skin.  Reports" bone pain".  She not denies specific injury or trauma while working in the yard.  Denies fevers dizziness lightheadedness fatigue or nausea.  HPI  Past Medical History:  Diagnosis Date  . Allergic rhinitis   . Anxiety   . Cervical dysplasia 1990   s/p conization  . Chronic sinusitis   . Depression   . Fibromyalgia   . Hiatal hernia   . Hip fracture, left ([place]) 2005   s/p ORIF  . Hypothyroidism   . Migraine   . Neuropathy   . Schatzki's ring     Patient Active Problem List   Diagnosis Date Noted  . Wheezing 10/08/2020  . Malar rash 10/07/2020  . Dry mouth 10/14/2020  . Pernio, initial encounter 10/17/2020  . Eosinophilic esophagitis 98/92/1194  . Menopause 11/29/2019  . Prediabetes 10/25/2019  . Chronic cough 10/18/2019  . Grief 09/20/2019  . Anxiety 09/08/2019  . Fibromyalgia 09/08/2019  . Bronchitis with acute wheezing 07/12/2019  . Peripheral neuropathy 06/14/2019  . BPV (benign positional vertigo), right 06/14/2019  . Dust exposure 10/21/2018  . Vitamin D deficiency 09/14/2018  . Loose stools 09/14/2018  . Unintentional weight loss 09/14/2018  . Fatigue 10/01/2017  . Generalized anxiety disorder 11/21/2015  . Polyarthralgia 08/14/2015  . Vitamin B12  deficiency 08/10/2014  . Insomnia 06/30/2012  . Migraine with status migrainosus 05/23/2011  . Depression   . Hypothyroidism due to Hashimoto's thyroiditis     Past Surgical History:  Procedure Laterality Date  . BREAST ENHANCEMENT SURGERY  2007  . BREAST IMPLANT REMOVAL Bilateral 05/21/2018  . DILATION AND CURETTAGE OF UTERUS  1989  . ORIF HIP FRACTURE  2005   Daldorf  . TONSILLECTOMY AND ADENOIDECTOMY    . TOTAL HIP ARTHROPLASTY Left 2005   second surgery     OB History   No obstetric history on file.      Home Medications    Prior to Admission medications   Medication Sig Start Date End Date Taking? Authorizing Provider  ARMOUR THYROID 60 MG tablet TAKE 1 TABLET BY MOUTH DAILY BEFORE BREAKFAST 09/08/20  Yes Lesleigh Noe, MD  cephALEXin (KEFLEX) 500 MG capsule Take 1 capsule (500 mg total) by mouth 4 (four) times daily. 10/29/20  Yes Caleen Taaffe C, PA-C  cetirizine (ZYRTEC) 10 MG tablet Take 10 mg by mouth at bedtime.   Yes [provider]  fluticasone (FLONASE) 50 MCG/ACT nasal spray USE 2 SPRAYS INTO EACH NOSTRIL DAILY 06/22/20  Yes Padgett, Rae Halsted, MD  naproxen (NAPROSYN) 500 MG tablet Take 1 tablet (500 mg total) by mouth 2 (two) times daily. 10/29/20  Yes Sharmeka Palmisano C, PA-C  pantoprazole (PROTONIX) 40 MG tablet Take 1 tablet (40 mg total) by  mouth daily. 04/27/20  Yes Pyrtle, Lajuan Lines, MD  progesterone (PROMETRIUM) 100 MG capsule Take 100 mg by mouth at bedtime. 09/07/20  Yes [provider]  triamcinolone (KENALOG) 0.1 % Apply 1 application topically 2 (two) times daily. 10/29/20  Yes Mahrosh Donnell C, PA-C  albuterol (VENTOLIN HFA) 108 (90 Base) MCG/ACT inhaler Inhale 2 puffs into the lungs every 4 (four) hours as needed for wheezing or shortness of breath. 07/26/19   Hall-Potvin, [place], PA-C  azelastine (ASTELIN) 0.1 % nasal spray Place 1 spray into both nostrils 2 (two) times daily. Use in each nostril as directed 10/09/20   Lesleigh Noe, MD  budesonide (PULMICORT) 1 MG/2ML nebulizer solution Mix 5ml in the am and pm with 1 teaspoon applesauce or honey and swallow daily 10/19/20   Pyrtle, Lajuan Lines, MD  Cholecalciferol (VITAMIN D PO) Take by mouth daily.    [provider]  DOTTI 0.05 MG/24HR patch Place 0.05 mg onto the skin every Wednesday and Saturday. 09/11/20   [provider]  DULoxetine (CYMBALTA) 60 MG capsule Take 1 capsule (60 mg total) by mouth daily. 02/25/20   Lesleigh Noe, MD  GAMMA AMINOBUTYRIC ACID PO Take 750 mg by mouth daily.    [provider]  hydrOXYzine (ATARAX/VISTARIL) 50 MG tablet Take 1 tablet (50 mg total) by mouth every 6 (six) hours as needed. 10/22/18   Lucille Passy, MD  Magnesium Citrate 100 MG TABS Take 200 mg by mouth daily.    [provider]  meclizine (ANTIVERT) 25 MG tablet Take 1 tablet (25 mg total) by mouth 3 (three) times daily as needed for dizziness. 06/14/19   Lucille Passy, MD  Melatonin 10 MG TABS Take 10 mg by mouth at bedtime.    [provider]  methocarbamol (ROBAXIN) 500 MG tablet Take 1 tablet (500 mg total) by mouth 2 (two) times daily as needed for muscle spasms. 09/08/19   Lucille Passy, MD  Omega-3 Fatty Acids (FISH OIL PO) Take by mouth daily.    [provider]  OVER THE COUNTER MEDICATION Seleium 200 mg    [provider]  polyethylene glycol (MIRALAX / GLYCOLAX) 17 g packet as needed.    [provider]  SUMAtriptan (IMITREX) 50 MG tablet Take 1 tablet (50 mg total) by mouth every 2 (two) hours as needed for migraine. May repeat in 2 hours if headache persists or recurs. 09/20/19   Lucille Passy, MD  TURMERIC CURCUMIN PO Take 1 tablet by mouth daily.    [provider]  vitamin E 400 UNIT capsule Take 400 Units by mouth daily.    [provider]    Family History Family History  Problem Relation Age of Onset  . Arthritis/Rheumatoid Mother        RA and OA  . Asthma Mother   .  Breast cancer Maternal Aunt 48  . Irregular heart beat Father        need ablation  . Basal cell carcinoma Father   . Asthma Father        exercise induced asthma   . Arthritis Maternal Grandmother   . Heart disease Maternal Grandmother   . Lung cancer Paternal Grandfather   . Drug abuse Brother   . Alcohol abuse Brother   . Appendicitis Son   . Allergies Son   . Migraines Son   . Eczema Son   . Healthy Son   . Allergies Daughter   .  Healthy Daughter   . Colon cancer Neg Hx   . Pancreatic cancer Neg Hx   . Stomach cancer Neg Hx   . Esophageal cancer Neg Hx   . Rectal cancer Neg Hx     Social History Social History   Tobacco Use  . Smoking status: Never Smoker  . Smokeless tobacco: Never Used  Vaping Use  . Vaping Use: Never used  Substance Use Topics  . Alcohol use: Not Currently  . Drug use: No     Allergies   Patient has no known allergies.   Review of Systems Review of Systems  Constitutional: Negative for fatigue and fever.  HENT: Negative for mouth sores.   Eyes: Negative for visual disturbance.  Respiratory: Negative for shortness of breath.   Cardiovascular: Negative for chest pain.  Gastrointestinal: Negative for abdominal pain, nausea and vomiting.  Genitourinary: Negative for genital sores.  Musculoskeletal: Positive for arthralgias. Negative for joint swelling.  Skin: Positive for color change. Negative for rash and wound.  Neurological: Negative for dizziness, weakness, light-headedness and headaches.     Physical Exam Triage Vital Signs ED Triage Vitals  Enc Vitals Group     BP      Pulse      Resp      Temp      Temp src      SpO2      Weight      Height      Head Circumference      Peak Flow      Pain Score      Pain Loc      Pain Edu?      Excl. in [place]?    No data found.  Updated Vital Signs BP 114/77 (BP Location: Left Arm)   Pulse 80   Temp 98.4 F (36.9 C) (Oral)   Resp 20   LMP 07/05/2019   SpO2 97%   Visual  Acuity Right Eye Distance:   Left Eye Distance:   Bilateral Distance:    Right Eye Near:   Left Eye Near:    Bilateral Near:     Physical Exam Vitals and nursing note reviewed.  Constitutional:      Appearance: She is well-developed and well-nourished.     Comments: No acute distress  HENT:     Head: Normocephalic and atraumatic.     Nose: Nose normal.  Eyes:     Conjunctiva/sclera: Conjunctivae normal.  Cardiovascular:     Rate and Rhythm: Normal rate and regular rhythm.  Pulmonary:     Effort: Pulmonary effort is normal. No respiratory distress.     Comments: Breathing comfortably at rest, CTABL, no wheezing, rales or other adventitious sounds auscultated Abdominal:     General: There is no distension.  Musculoskeletal:        General: Normal range of motion.     Cervical back: Neck supple.     Comments: Right wrist with approximately 2 to 3 cm oval area of erythema with central slight darkening, no induration or fluctuance; tenderness to palpation throughout distal radius extending slightly into carpal area  Radial pulse 2+, full active range of motion of wrist  Skin:    General: Skin is warm and dry.  Neurological:     Mental Status: She is alert and oriented to person, place, and time.  Psychiatric:        Mood and Affect: Mood and affect normal.      UC Treatments / Results  Labs (all labs ordered are listed, but only abnormal results are displayed) Labs Reviewed - No data to display  EKG   Radiology No results found.  Procedures Procedures (including critical [name] time)  Medications Ordered in UC Medications - No data to display  Initial Impression / Assessment and Plan / UC Course  I have reviewed the triage vital signs and the nursing notes.  Pertinent labs & imaging results that were available during my [name] of the patient were reviewed by me and considered in my medical decision making (see chart for details).     No injury trauma or  mechanism of injury, low suspicion of acute bony abnormality, deferring imaging.  Erythema seems relatively superficial, does not appear suggestive of early abscess at this time, but opting to go ahead and cover with Keflex to treat possible cellulitis.  No obvious foreign body within skin noted.  Providing triamcinolone to apply topically to help with localized inflammation, anti-inflammatories for pain and swelling, monitor for improvement of symptoms over the next 24 to 48 hours.  Discussed strict return precautions. Patient verbalized understanding and is agreeable with plan.  Final Clinical Impressions(s) / UC Diagnoses   Final diagnoses:  Right wrist pain     Discharge Instructions     Begin Keflex 4 times daily for 5 days Naprosyn twice daily for pain or may use Tylenol/ibuprofen as alternative Triamcinolone twice daily to area Continue to monitor symptoms over the next 2 to 3 days, follow-up if not improving or worsening    ED Prescriptions    Medication Sig Dispense Auth. Provider   naproxen (NAPROSYN) 500 MG tablet Take 1 tablet (500 mg total) by mouth 2 (two) times daily. 30 tablet Novia Lansberry C, PA-C   cephALEXin (KEFLEX) 500 MG capsule Take 1 capsule (500 mg total) by mouth 4 (four) times daily. 20 capsule Adil Tugwell C, PA-C   triamcinolone (KENALOG) 0.1 % Apply 1 application topically 2 (two) times daily. 30 g Mekenzie Modeste, [place] C, PA-C     PDMP not reviewed this encounter.   Janith Lima, PA-C 10/29/20 1406

## 2020-10-29 NOTE — Discharge Instructions (Signed)
Begin Keflex 4 times daily for 5 days Naprosyn twice daily for pain or may use Tylenol/ibuprofen as alternative Triamcinolone twice daily to area Continue to monitor symptoms over the next 2 to 3 days, follow-up if not improving or worsening

## 2020-11-08 ENCOUNTER — Other Ambulatory Visit: Payer: Self-pay

## 2020-11-08 ENCOUNTER — Encounter: Payer: Self-pay | Admitting: Rheumatology

## 2020-11-08 ENCOUNTER — Ambulatory Visit: Payer: BC Managed Care – PPO | Admitting: Rheumatology

## 2020-11-08 VITALS — BP 115/79 | HR 70 | Resp 15 | Ht 69.75 in | Wt 199.8 lb

## 2020-11-08 DIAGNOSIS — E538 Deficiency of other specified B group vitamins: Secondary | ICD-10-CM

## 2020-11-08 DIAGNOSIS — M3509 Sicca syndrome with other organ involvement: Secondary | ICD-10-CM

## 2020-11-08 DIAGNOSIS — E038 Other specified hypothyroidism: Secondary | ICD-10-CM

## 2020-11-08 DIAGNOSIS — Z96642 Presence of left artificial hip joint: Secondary | ICD-10-CM

## 2020-11-08 DIAGNOSIS — M19042 Primary osteoarthritis, left hand: Secondary | ICD-10-CM

## 2020-11-08 DIAGNOSIS — E559 Vitamin D deficiency, unspecified: Secondary | ICD-10-CM

## 2020-11-08 DIAGNOSIS — R7989 Other specified abnormal findings of blood chemistry: Secondary | ICD-10-CM

## 2020-11-08 DIAGNOSIS — R21 Rash and other nonspecific skin eruption: Secondary | ICD-10-CM

## 2020-11-08 DIAGNOSIS — M19041 Primary osteoarthritis, right hand: Secondary | ICD-10-CM

## 2020-11-08 DIAGNOSIS — Z8261 Family history of arthritis: Secondary | ICD-10-CM

## 2020-11-08 DIAGNOSIS — K200 Eosinophilic esophagitis: Secondary | ICD-10-CM

## 2020-11-08 DIAGNOSIS — E063 Autoimmune thyroiditis: Secondary | ICD-10-CM

## 2020-11-08 DIAGNOSIS — H8111 Benign paroxysmal vertigo, right ear: Secondary | ICD-10-CM

## 2020-11-08 DIAGNOSIS — M2240 Chondromalacia patellae, unspecified knee: Secondary | ICD-10-CM

## 2020-11-08 DIAGNOSIS — R768 Other specified abnormal immunological findings in serum: Secondary | ICD-10-CM

## 2020-11-08 DIAGNOSIS — R5383 Other fatigue: Secondary | ICD-10-CM

## 2020-11-08 DIAGNOSIS — G6289 Other specified polyneuropathies: Secondary | ICD-10-CM

## 2020-11-08 DIAGNOSIS — U071 COVID-19: Secondary | ICD-10-CM

## 2020-11-08 DIAGNOSIS — M797 Fibromyalgia: Secondary | ICD-10-CM

## 2020-11-08 LAB — BASIC METABOLIC PANEL WITH GFR
BUN: 22 mg/dL (ref 7–25)
CO2: 27 mmol/L (ref 20–32)
Calcium: 10.1 mg/dL (ref 8.6–10.4)
Chloride: 102 mmol/L (ref 98–110)
Creat: 0.98 mg/dL (ref 0.50–1.05)
GFR, Est African American: 74 mL/min/{1.73_m2} (ref >=60)
GFR, Est Non African American: 64 mL/min/{1.73_m2} (ref >=60)
Glucose, Bld: 91 mg/dL (ref 65–99)
Potassium: 4.9 mmol/L (ref 3.5–5.3)
Sodium: 135 mmol/L (ref 135–146)

## 2020-11-22 DIAGNOSIS — I872 Venous insufficiency (chronic) (peripheral): Secondary | ICD-10-CM | POA: Diagnosis not present

## 2020-11-22 DIAGNOSIS — D225 Melanocytic nevi of trunk: Secondary | ICD-10-CM | POA: Diagnosis not present

## 2020-11-22 DIAGNOSIS — L821 Other seborrheic keratosis: Secondary | ICD-10-CM | POA: Diagnosis not present

## 2020-11-22 DIAGNOSIS — Z1283 Encounter for screening for malignant neoplasm of skin: Secondary | ICD-10-CM | POA: Diagnosis not present

## 2021-01-11 ENCOUNTER — Ambulatory Visit: Payer: BC Managed Care – PPO | Admitting: Family Medicine

## 2021-01-11 ENCOUNTER — Other Ambulatory Visit: Payer: Self-pay

## 2021-01-11 VITALS — BP 92/76 | HR 75 | Temp 97.7°F | Ht 70.0 in | Wt 202.5 lb

## 2021-01-11 DIAGNOSIS — M3509 Sicca syndrome with other organ involvement: Secondary | ICD-10-CM

## 2021-01-11 DIAGNOSIS — L74519 Primary focal hyperhidrosis, unspecified: Secondary | ICD-10-CM | POA: Insufficient documentation

## 2021-01-11 DIAGNOSIS — K200 Eosinophilic esophagitis: Secondary | ICD-10-CM | POA: Diagnosis not present

## 2021-01-11 DIAGNOSIS — N369 Urethral disorder, unspecified: Secondary | ICD-10-CM | POA: Insufficient documentation

## 2021-01-11 DIAGNOSIS — R0609 Other forms of dyspnea: Secondary | ICD-10-CM | POA: Insufficient documentation

## 2021-01-11 DIAGNOSIS — M3500 Sicca syndrome, unspecified: Secondary | ICD-10-CM | POA: Insufficient documentation

## 2021-01-11 DIAGNOSIS — R0600 Dyspnea, unspecified: Secondary | ICD-10-CM

## 2021-01-11 MED ORDER — ESTRADIOL 0.075 MG/24HR TD PTTW: 1.0000 | MEDICATED_PATCH | TRANSDERMAL | 12 refills | Status: DC

## 2021-01-11 NOTE — Patient Instructions (Addendum)
#  EKG - no sign of irregular rhythm  #Breathing - continue to monitoring  - I wonder if this is some intolerance that as you do more activity should improve - if worsening let me know  #Sweating - try the higher dose of estrogen and update if not improving  #Air passage - Send Mychart message if you decide you want to see the urologist

## 2021-01-11 NOTE — Assessment & Plan Note (Signed)
Lack of response to treatment. Reviewed sjogrens symptoms and discussed that chronic AM cough not likely 2/2 to this. She will f/u with GI. Appreciate support.

## 2021-01-11 NOTE — Assessment & Plan Note (Signed)
Pt notes gas bubble sensation in the urethra daily. Discussed urology referral to evaluate/rule out fistula. At this point with lack of other symptoms she would like to watch and wait.

## 2021-01-11 NOTE — Assessment & Plan Note (Signed)
Suspect 2/2 to menopause and discussed trial of increased estrogen patch 0.05 >0.075 mg. Thyroid was normal 09/2020

## 2021-01-11 NOTE — Assessment & Plan Note (Signed)
Hx of covid infection. Suspect this may be some exercise intolerance. Advised monitoring and if not improving to follow-up.

## 2021-01-11 NOTE — Progress Notes (Signed)
Subjective:     [name] is a 60 y.o. female presenting for Advice Only (About recent dx of Sjorgrens and symptoms related to it. Rheumatologist requests EKG. )     HPI  #Sjogren's - told to see ophthalmologist and get an EKG - is considering getting a second opinion    #esophageal eosinophilia  - never really found a good treatment for this - continues to have raspy morning cough - cost to see a specialist is increasing with her plan   #new symptoms - sweating has increased  - menopause - on hormone replacement which has helped with painful intercourse -- taking progesterone and estrogen patch - getting groin sweat which is significant and getting through clothes - endorses sob with exercise   After prescription changes notes she thinks her OB may have already increased the estrogen dose   Review of Systems  11/08/2020: Rheumatology - Dr. Estanislado Pandy - she is considering treatment and planning to discuss with ophthalmology. Advised baseline ekg due to risk for arrhythmias  Social History   Tobacco Use  Smoking Status Never Smoker  Smokeless Tobacco Never Used        Objective:    BP Readings from Last 3 Encounters:  01/11/21 92/76  11/08/20 115/79  10/29/20 114/77   Wt Readings from Last 3 Encounters:  01/11/21 202 lb 8 oz (91.9 kg)  11/08/20 199 lb 12.8 oz (90.6 kg)  10/09/20 197 lb 12.8 oz (89.7 kg)    BP 92/76   Pulse 75   Temp 97.7 F (36.5 C) (Temporal)   Ht 5\' 10"  (1.778 m)   Wt 202 lb 8 oz (91.9 kg)   LMP 07/05/2019   SpO2 97%   BMI 29.06 kg/m    Physical Exam Constitutional:      General: She is not in acute distress.    Appearance: She is well-developed. She is not diaphoretic.  HENT:     Right Ear: External ear normal.     Left Ear: External ear normal.  Eyes:     Conjunctiva/sclera: Conjunctivae normal.  Cardiovascular:     Rate and Rhythm: Normal rate and regular rhythm.     Heart sounds: No murmur  heard.   Pulmonary:     Effort: Pulmonary effort is normal. No respiratory distress.     Breath sounds: Normal breath sounds. No wheezing.  Musculoskeletal:     Cervical back: Neck supple.  Skin:    General: Skin is warm and dry.     Capillary Refill: Capillary refill takes less than 2 seconds.  Neurological:     Mental Status: She is alert. Mental status is at baseline.  Psychiatric:        Mood and Affect: Mood normal.        Behavior: Behavior normal.    EKG: NSR, RSR in V1 minimal. Low voltage. No st changes       Assessment & Plan:   Problem List Items Addressed This Visit      Digestive   Eosinophilic esophagitis    Lack of response to treatment. Reviewed sjogrens symptoms and discussed that chronic AM cough not likely 2/2 to this. She will f/u with GI. Appreciate support.         Musculoskeletal and Integument   Excessive sweating, local    Suspect 2/2 to menopause and discussed trial of increased estrogen patch 0.05 >0.075 mg. Thyroid was normal 09/2020         Genitourinary   Urethra  disorder    Pt notes gas bubble sensation in the urethra daily. Discussed urology referral to evaluate/rule out fistula. At this point with lack of other symptoms she would like to watch and wait.         Other   Sjogren's disease ([place]) - Primary    With dry eyes/mouth. Saw Dr. Estanislado Pandy but is interested in a second opinion. Planning to f/u with ophthalmology. Baseline EKG today w/o signs of arrhythmia. Encouraged continued ophthalmology and rheum care.       Relevant Orders   EKG 12-Lead (Completed)   Ambulatory referral to Rheumatology   Dyspnea on exertion    Hx of covid infection. Suspect this may be some exercise intolerance. Advised monitoring and if not improving to follow-up.           Return in about 2 months (around 03/13/2021).  Lesleigh Noe, MD  This visit occurred during the SARS-CoV-2 public health emergency.  Safety protocols were in place,  including screening questions prior to the visit, additional usage of staff PPE, and extensive cleaning of exam room while observing appropriate contact time as indicated for disinfecting solutions.

## 2021-01-11 NOTE — Assessment & Plan Note (Signed)
With dry eyes/mouth. Saw Dr. Estanislado Pandy but is interested in a second opinion. Planning to f/u with ophthalmology. Baseline EKG today w/o signs of arrhythmia. Encouraged continued ophthalmology and rheum care.

## 2021-01-29 NOTE — Progress Notes (Deleted)
Office Visit Note  Patient: [name]             Date of Birth: March 19, 1961           MRN: [id number]             PCP: Lesleigh Noe, MD Referring: Lesleigh Noe, MD Visit Date: 02/12/2021 Occupation: @GUAROCC @  Subjective:  No chief complaint on file.   History of Present Illness: [name] is a 60 y.o. female ***   Activities of Daily Living:  Patient reports morning stiffness for *** {minute/hour:19697}.   Patient {ACTIONS;DENIES/REPORTS:21021675::"Denies"} nocturnal pain.  Difficulty dressing/grooming: {ACTIONS;DENIES/REPORTS:21021675::"Denies"} Difficulty climbing stairs: {ACTIONS;DENIES/REPORTS:21021675::"Denies"} Difficulty getting out of chair: {ACTIONS;DENIES/REPORTS:21021675::"Denies"} Difficulty using hands for taps, buttons, cutlery, and/or writing: {ACTIONS;DENIES/REPORTS:21021675::"Denies"}  No Rheumatology ROS completed.   PMFS History:  Patient Active Problem List   Diagnosis Date Noted  . Sjogren's disease ([place]) 01/11/2021  . Excessive sweating, local 01/11/2021  . Urethra disorder 01/11/2021  . Dyspnea on exertion 01/11/2021  . Wheezing 09/27/2020  . Malar rash 09/27/2020  . Dry mouth 09/27/2020  . Pernio, initial encounter 09/27/2020  . Eosinophilic esophagitis 63/08/6008  . Menopause 11/29/2019  . Prediabetes 10/25/2019  . Chronic cough 10/18/2019  . Grief 09/20/2019  . Anxiety 09/08/2019  . Fibromyalgia 09/08/2019  . Bronchitis with acute wheezing 07/12/2019  . Peripheral neuropathy 06/14/2019  . BPV (benign positional vertigo), right 06/14/2019  . Dust exposure 10/21/2018  . Vitamin D deficiency 09/14/2018  . Loose stools 09/14/2018  . Unintentional weight loss 09/14/2018  . Fatigue 10/01/2017  . Generalized anxiety disorder 11/21/2015  . Polyarthralgia 08/14/2015  . Vitamin B12 deficiency 08/10/2014  . Insomnia 06/30/2012  . Migraine with status migrainosus 05/23/2011  . Depression   . Hypothyroidism due to  Hashimoto's thyroiditis     Past Medical History:  Diagnosis Date  . Allergic rhinitis   . Anxiety   . Cervical dysplasia 1990   s/p conization  . Chronic sinusitis   . Depression   . Fibromyalgia   . Hiatal hernia   . Hip fracture, left ([place]) 2005   s/p ORIF  . Hypothyroidism   . Migraine   . Neuropathy   . Schatzki's ring     Family History  Problem Relation Age of Onset  . Arthritis/Rheumatoid Mother        RA and OA  . Asthma Mother   . Breast cancer Maternal Aunt 48  . Irregular heart beat Father        need ablation  . Basal cell carcinoma Father   . Asthma Father        exercise induced asthma   . Arthritis Maternal Grandmother   . Heart disease Maternal Grandmother   . Lung cancer Paternal Grandfather   . Drug abuse Brother   . Alcohol abuse Brother   . Appendicitis Son   . Allergies Son   . Migraines Son   . Eczema Son   . Healthy Son   . Allergies Daughter   . Healthy Daughter   . Colon cancer Neg Hx   . Pancreatic cancer Neg Hx   . Stomach cancer Neg Hx   . Esophageal cancer Neg Hx   . Rectal cancer Neg Hx    Past Surgical History:  Procedure Laterality Date  . BREAST ENHANCEMENT SURGERY  2007  . BREAST IMPLANT REMOVAL Bilateral 05/21/2018  . DILATION AND CURETTAGE OF UTERUS  1989  . ORIF HIP FRACTURE  2005   [facility]  .  TONSILLECTOMY AND ADENOIDECTOMY    . 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 Influenza,inj,Quad PF,6+ Mos 08/10/2014, 08/14/2015, 05/08/2016, 06/23/2017, 09/14/2018, 06/01/2019  . Tdap 05/01/2016     Objective: Vital Signs: LMP 07/05/2019    Physical Exam   Musculoskeletal Exam: ***  CDAI Exam: CDAI Score: -- Patient Global: --; Provider Global: -- Swollen: --; Tender: -- Joint Exam 02/12/2021   No joint exam has been documented for this visit   There is currently no information documented on the homunculus. Go to the Rheumatology activity and complete the homunculus joint exam.  Investigation: No additional findings.  Imaging: No results found.  Recent Labs: Lab Results  Component Value Date   WBC 6.0 10/09/2020   HGB 13.6 10/09/2020   PLT 307 10/09/2020   NA 135 11/08/2020   K 4.9 11/08/2020   CL 102 11/08/2020   CO2 27 11/08/2020   GLUCOSE 91 11/08/2020   BUN 22 11/08/2020   CREATININE 0.98 11/08/2020   BILITOT 0.4 10/09/2020   ALKPHOS 76 09/27/2020   AST 17 10/09/2020   ALT 13 10/09/2020   PROT 7.7 10/09/2020   ALBUMIN 4.0 09/27/2020   CALCIUM 10.1 11/08/2020   GFRAA 74 11/08/2020    Speciality Comments: No specialty comments available.  Procedures:  No procedures performed Allergies: Patient has no known allergies.   Assessment / Plan:     Visit Diagnoses: No diagnosis found.  Orders: No orders of the defined types were placed in this encounter.  No orders of the defined types were placed in this encounter.   Face-to-face time spent with patient was *** minutes. Greater than 50% of time was spent in counseling and coordination of care.  Follow-Up Instructions: No follow-ups on file.   Earnestine Mealing, CMA  Note - This record has been created using Editor, commissioning.  Chart creation errors have been sought, but may not always  have been located. Such creation errors do not reflect on  the standard of medical care.

## 2021-02-12 ENCOUNTER — Ambulatory Visit: Payer: BC Managed Care – PPO | Admitting: Rheumatology

## 2021-02-12 DIAGNOSIS — M19041 Primary osteoarthritis, right hand: Secondary | ICD-10-CM

## 2021-02-12 DIAGNOSIS — G6289 Other specified polyneuropathies: Secondary | ICD-10-CM

## 2021-02-12 DIAGNOSIS — K200 Eosinophilic esophagitis: Secondary | ICD-10-CM

## 2021-02-12 DIAGNOSIS — E559 Vitamin D deficiency, unspecified: Secondary | ICD-10-CM

## 2021-02-12 DIAGNOSIS — Z96642 Presence of left artificial hip joint: Secondary | ICD-10-CM

## 2021-02-12 DIAGNOSIS — M2240 Chondromalacia patellae, unspecified knee: Secondary | ICD-10-CM

## 2021-02-12 DIAGNOSIS — H8111 Benign paroxysmal vertigo, right ear: Secondary | ICD-10-CM

## 2021-02-12 DIAGNOSIS — R5383 Other fatigue: Secondary | ICD-10-CM

## 2021-02-12 DIAGNOSIS — R21 Rash and other nonspecific skin eruption: Secondary | ICD-10-CM

## 2021-02-12 DIAGNOSIS — E038 Other specified hypothyroidism: Secondary | ICD-10-CM

## 2021-02-12 DIAGNOSIS — R7989 Other specified abnormal findings of blood chemistry: Secondary | ICD-10-CM

## 2021-02-12 DIAGNOSIS — M797 Fibromyalgia: Secondary | ICD-10-CM

## 2021-02-12 DIAGNOSIS — M3509 Sicca syndrome with other organ involvement: Secondary | ICD-10-CM

## 2021-02-12 DIAGNOSIS — E538 Deficiency of other specified B group vitamins: Secondary | ICD-10-CM

## 2021-02-12 DIAGNOSIS — Z8261 Family history of arthritis: Secondary | ICD-10-CM

## 2021-02-14 ENCOUNTER — Other Ambulatory Visit: Payer: Self-pay | Admitting: *Deleted

## 2021-02-14 DIAGNOSIS — F4321 Adjustment disorder with depressed mood: Secondary | ICD-10-CM

## 2021-02-14 DIAGNOSIS — F329 Major depressive disorder, single episode, unspecified: Secondary | ICD-10-CM

## 2021-02-14 DIAGNOSIS — G6289 Other specified polyneuropathies: Secondary | ICD-10-CM

## 2021-02-14 MED ORDER — DULOXETINE HCL 60 MG PO CPEP: 60.0000 mg | ORAL_CAPSULE | Freq: Every day | ORAL | 0 refills | Status: DC

## 2021-02-16 DIAGNOSIS — H04123 Dry eye syndrome of bilateral lacrimal glands: Secondary | ICD-10-CM | POA: Diagnosis not present

## 2021-02-16 DIAGNOSIS — H25811 Combined forms of age-related cataract, right eye: Secondary | ICD-10-CM | POA: Diagnosis not present

## 2021-02-16 DIAGNOSIS — H2512 Age-related nuclear cataract, left eye: Secondary | ICD-10-CM | POA: Diagnosis not present

## 2021-02-27 ENCOUNTER — Encounter: Payer: Self-pay | Admitting: Family Medicine

## 2021-02-27 MED ORDER — ESTRADIOL 0.075 MG/24HR TD PTTW
1.0000 | MEDICATED_PATCH | TRANSDERMAL | 3 refills | Status: DC
Start: 2021-03-01 — End: 2021-12-03
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
## 2021-03-13 ENCOUNTER — Encounter: Payer: Self-pay | Admitting: Family Medicine

## 2021-03-13 ENCOUNTER — Other Ambulatory Visit: Payer: Self-pay

## 2021-03-13 ENCOUNTER — Encounter: Payer: Self-pay | Admitting: Rheumatology

## 2021-03-13 ENCOUNTER — Ambulatory Visit: Payer: BC Managed Care – PPO | Admitting: Rheumatology

## 2021-03-13 VITALS — BP 111/76 | HR 67 | Ht 70.0 in | Wt 206.2 lb

## 2021-03-13 DIAGNOSIS — R61 Generalized hyperhidrosis: Secondary | ICD-10-CM

## 2021-03-13 DIAGNOSIS — M19041 Primary osteoarthritis, right hand: Secondary | ICD-10-CM | POA: Diagnosis not present

## 2021-03-13 DIAGNOSIS — E063 Autoimmune thyroiditis: Secondary | ICD-10-CM

## 2021-03-13 DIAGNOSIS — Z96642 Presence of left artificial hip joint: Secondary | ICD-10-CM

## 2021-03-13 DIAGNOSIS — M3509 Sicca syndrome with other organ involvement: Secondary | ICD-10-CM | POA: Diagnosis not present

## 2021-03-13 DIAGNOSIS — M797 Fibromyalgia: Secondary | ICD-10-CM

## 2021-03-13 DIAGNOSIS — H8111 Benign paroxysmal vertigo, right ear: Secondary | ICD-10-CM

## 2021-03-13 DIAGNOSIS — G8929 Other chronic pain: Secondary | ICD-10-CM

## 2021-03-13 DIAGNOSIS — R5383 Other fatigue: Secondary | ICD-10-CM

## 2021-03-13 DIAGNOSIS — M545 Low back pain, unspecified: Secondary | ICD-10-CM

## 2021-03-13 DIAGNOSIS — R21 Rash and other nonspecific skin eruption: Secondary | ICD-10-CM

## 2021-03-13 DIAGNOSIS — M2240 Chondromalacia patellae, unspecified knee: Secondary | ICD-10-CM

## 2021-03-13 DIAGNOSIS — U071 COVID-19: Secondary | ICD-10-CM

## 2021-03-13 DIAGNOSIS — M19042 Primary osteoarthritis, left hand: Secondary | ICD-10-CM

## 2021-03-13 DIAGNOSIS — Z8261 Family history of arthritis: Secondary | ICD-10-CM

## 2021-03-13 DIAGNOSIS — G6289 Other specified polyneuropathies: Secondary | ICD-10-CM

## 2021-03-13 DIAGNOSIS — E538 Deficiency of other specified B group vitamins: Secondary | ICD-10-CM

## 2021-03-13 DIAGNOSIS — E559 Vitamin D deficiency, unspecified: Secondary | ICD-10-CM

## 2021-03-13 DIAGNOSIS — E038 Other specified hypothyroidism: Secondary | ICD-10-CM

## 2021-03-13 DIAGNOSIS — K200 Eosinophilic esophagitis: Secondary | ICD-10-CM

## 2021-03-13 NOTE — Patient Instructions (Signed)
Back Exercises The following exercises strengthen the muscles that help to support the trunk and back. They also help to keep the lower back flexible. Doing these exercises can help to prevent back pain or lessen existing pain. If you have back pain or discomfort, try doing these exercises 2-3 times each day or as told by your health care provider. As your pain improves, do them once each day, but increase the number of times that you repeat the steps for each exercise (do more repetitions). To prevent the recurrence of back pain, continue to do these exercises once each day or as told by your health care provider. Do exercises exactly as told by your health care provider and adjust them as directed. It is normal to feel mild stretching, pulling, tightness, or discomfort as you do these exercises, but you should stop right away if youfeel sudden pain or your pain gets worse. Exercises Single knee to chest Repeat these steps 3-5 times for each leg: Lie on your back on a firm bed or the floor with your legs extended. Bring one knee to your chest. Your other leg should stay extended and in contact with the floor. Hold your knee in place by grabbing your knee or thigh with both hands and hold. Pull on your knee until you feel a gentle stretch in your lower back or buttocks. Hold the stretch for 10-30 seconds. Slowly release and straighten your leg. Pelvic tilt Repeat these steps 5-10 times: Lie on your back on a firm bed or the floor with your legs extended. Bend your knees so they are pointing toward the ceiling and your feet are flat on the floor. Tighten your lower abdominal muscles to press your lower back against the floor. This motion will tilt your pelvis so your tailbone points up toward the ceiling instead of pointing to your feet or the floor. With gentle tension and even breathing, hold this position for 5-10 seconds. Cat-cow Repeat these steps until your lower back becomes more  flexible: Get into a hands-and-knees position on a firm surface. Keep your hands under your shoulders, and keep your knees under your hips. You may place padding under your knees for comfort. Let your head hang down toward your chest. Contract your abdominal muscles and point your tailbone toward the floor so your lower back becomes rounded like the back of a cat. Hold this position for 5 seconds. Slowly lift your head, let your abdominal muscles relax and point your tailbone up toward the ceiling so your back forms a sagging arch like the back of a cow. Hold this position for 5 seconds.  Press-ups Repeat these steps 5-10 times: Lie on your abdomen (face-down) on the floor. Place your palms near your head, about shoulder-width apart. Keeping your back as relaxed as possible and keeping your hips on the floor, slowly straighten your arms to raise the top half of your body and lift your shoulders. Do not use your back muscles to raise your upper torso. You may adjust the placement of your hands to make yourself more comfortable. Hold this position for 5 seconds while you keep your back relaxed. Slowly return to lying flat on the floor.  Bridges Repeat these steps 10 times: Lie on your back on a firm surface. Bend your knees so they are pointing toward the ceiling and your feet are flat on the floor. Your arms should be flat at your sides, next to your body. Tighten your buttocks muscles and lift your   buttocks off the floor until your waist is at almost the same height as your knees. You should feel the muscles working in your buttocks and the back of your thighs. If you do not feel these muscles, slide your feet 1-2 inches farther away from your buttocks. Hold this position for 3-5 seconds. Slowly lower your hips to the starting position, and allow your buttocks muscles to relax completely. If this exercise is too easy, try doing it with your arms crossed over yourchest. Abdominal  crunches Repeat these steps 5-10 times: Lie on your back on a firm bed or the floor with your legs extended. Bend your knees so they are pointing toward the ceiling and your feet are flat on the floor. Cross your arms over your chest. Tip your chin slightly toward your chest without bending your neck. Tighten your abdominal muscles and slowly raise your trunk (torso) high enough to lift your shoulder blades a tiny bit off the floor. Avoid raising your torso higher than that because it can put too much stress on your low back and does not help to strengthen your abdominal muscles. Slowly return to your starting position. Back lifts Repeat these steps 5-10 times: Lie on your abdomen (face-down) with your arms at your sides, and rest your forehead on the floor. Tighten the muscles in your legs and your buttocks. Slowly lift your chest off the floor while you keep your hips pressed to the floor. Keep the back of your head in line with the curve in your back. Your eyes should be looking at the floor. Hold this position for 3-5 seconds. Slowly return to your starting position. Contact a health care provider if: Your back pain or discomfort gets much worse when you do an exercise. Your worsening back pain or discomfort does not lessen within 2 hours after you exercise. If you have any of these problems, stop doing these exercises right away. Do not do them again unless your health care provider says that you can. Get help right away if: You develop sudden, severe back pain. If this happens, stop doing the exercises right away. Do not do them again unless your health care provider says that you can. This information is not intended to replace advice given to you by your health care provider. Make sure you discuss any questions you have with your healthcare provider. Document Revised: 12/17/2018 Document Reviewed: 05/14/2018 Elsevier Patient Education  [place].

## 2021-05-22 ENCOUNTER — Telehealth: Payer: Self-pay | Admitting: Family Medicine

## 2021-05-22 DIAGNOSIS — G6289 Other specified polyneuropathies: Secondary | ICD-10-CM

## 2021-05-22 DIAGNOSIS — F329 Major depressive disorder, single episode, unspecified: Secondary | ICD-10-CM

## 2021-05-22 DIAGNOSIS — F4321 Adjustment disorder with depressed mood: Secondary | ICD-10-CM

## 2021-05-23 NOTE — Telephone Encounter (Signed)
Called pat to call back to schedule med refill apt

## 2021-05-25 ENCOUNTER — Telehealth: Payer: BC Managed Care – PPO | Admitting: Physician Assistant

## 2021-05-25 DIAGNOSIS — J019 Acute sinusitis, unspecified: Secondary | ICD-10-CM | POA: Diagnosis not present

## 2021-05-25 DIAGNOSIS — B9689 Other specified bacterial agents as the cause of diseases classified elsewhere: Secondary | ICD-10-CM

## 2021-05-25 MED ORDER — AMOXICILLIN-POT CLAVULANATE 875-125 MG PO TABS: 1.0000 | ORAL_TABLET | Freq: Two times a day (BID) | ORAL | 0 refills | Status: DC

## 2021-05-25 NOTE — Progress Notes (Signed)
E-Visit for Sinus Problems  We are sorry that you are not feeling well.  Here is how we plan to help!  Based on what you have shared with me it looks like you have sinusitis.  Sinusitis is inflammation and infection in the sinus cavities of the head.  Based on your presentation I believe you most likely have Acute Bacterial Sinusitis.  This is an infection caused by bacteria and is treated with antibiotics. I have prescribed Augmentin 875mg /125mg  one tablet twice daily with food, for 7 days. You may use an oral decongestant such as Mucinex D or if you have glaucoma or high blood pressure use plain Mucinex. Saline nasal spray help and can safely be used as often as needed for congestion.  If you develop worsening sinus pain, fever or notice severe headache and vision changes, or if symptoms are not better after completion of antibiotic, please schedule an appointment with a health care provider.    Sinus infections are not as easily transmitted as other respiratory infection, however we still recommend that you avoid close contact with loved ones, especially the very young and elderly.  Remember to wash your hands thoroughly throughout the day as this is the number one way to prevent the spread of infection!  Home Care: Only take medications as instructed by your medical team. Complete the entire course of an antibiotic. Do not take these medications with alcohol. A steam or ultrasonic humidifier can help congestion.  You can place a towel over your head and breathe in the steam from hot water coming from a faucet. Avoid close contacts especially the very young and the elderly. Cover your mouth when you cough or sneeze. Always remember to wash your hands.  Get Help Right Away If: You develop worsening fever or sinus pain. You develop a severe head ache or visual changes. Your symptoms persist after you have completed your treatment plan.  Make sure you Understand these instructions. Will watch  your condition. Will get help right away if you are not doing well or get worse.  Thank you for choosing an e-visit.  Your e-visit answers were reviewed by a board certified advanced clinical practitioner to complete your personal care plan. Depending upon the condition, your plan could have included both over the counter or prescription medications.  Please review your pharmacy choice. Make sure the pharmacy is open so you can pick up prescription now. If there is a problem, you may contact your provider through [employer] and have the prescription routed to another pharmacy.  Your safety is important to [place]. If you have drug allergies check your prescription carefully.   For the next 24 hours you can use MyChart to ask questions about today's visit, request a non-urgent call back, or ask for a work or school excuse. You will get an email in the next two days asking about your experience. I hope that your e-visit has been valuable and will speed your recovery.  I provided 6 minutes of non face-to-face time during this encounter for chart review and documentation.

## 2021-06-04 ENCOUNTER — Other Ambulatory Visit: Payer: Self-pay

## 2021-06-04 ENCOUNTER — Ambulatory Visit (INDEPENDENT_AMBULATORY_CARE_PROVIDER_SITE_OTHER): Payer: BC Managed Care – PPO | Admitting: Family Medicine

## 2021-06-04 ENCOUNTER — Encounter: Payer: Self-pay | Admitting: Family Medicine

## 2021-06-04 VITALS — BP 102/74 | HR 83 | Temp 97.5°F | Ht 69.75 in | Wt 207.0 lb

## 2021-06-04 DIAGNOSIS — M62838 Other muscle spasm: Secondary | ICD-10-CM | POA: Diagnosis not present

## 2021-06-04 DIAGNOSIS — Z23 Encounter for immunization: Secondary | ICD-10-CM | POA: Diagnosis not present

## 2021-06-04 DIAGNOSIS — F4321 Adjustment disorder with depressed mood: Secondary | ICD-10-CM

## 2021-06-04 DIAGNOSIS — F411 Generalized anxiety disorder: Secondary | ICD-10-CM

## 2021-06-04 DIAGNOSIS — F329 Major depressive disorder, single episode, unspecified: Secondary | ICD-10-CM

## 2021-06-04 DIAGNOSIS — Z0000 Encounter for general adult medical examination without abnormal findings: Secondary | ICD-10-CM

## 2021-06-04 DIAGNOSIS — K219 Gastro-esophageal reflux disease without esophagitis: Secondary | ICD-10-CM

## 2021-06-04 DIAGNOSIS — B3731 Acute candidiasis of vulva and vagina: Secondary | ICD-10-CM

## 2021-06-04 DIAGNOSIS — G6289 Other specified polyneuropathies: Secondary | ICD-10-CM | POA: Diagnosis not present

## 2021-06-04 DIAGNOSIS — M797 Fibromyalgia: Secondary | ICD-10-CM

## 2021-06-04 DIAGNOSIS — E663 Overweight: Secondary | ICD-10-CM

## 2021-06-04 LAB — LIPID PANEL
Cholesterol: 217 mg/dL — ABNORMAL HIGH (ref 0–200)
HDL: 79.3 mg/dL (ref >39.00)
LDL Cholesterol: 118 mg/dL — ABNORMAL HIGH (ref 0–99)
NonHDL: 138
Total CHOL/HDL Ratio: 3
Triglycerides: 100 mg/dL (ref 0.0–149.0)
VLDL: 20 mg/dL (ref 0.0–40.0)

## 2021-06-04 LAB — CBC
HCT: 39.9 % (ref 36.0–46.0)
Hemoglobin: 13.5 g/dL (ref 12.0–15.0)
MCHC: 33.9 g/dL (ref 30.0–36.0)
MCV: 92.8 fl (ref 78.0–100.0)
Platelets: 292 10*3/uL (ref 150.0–400.0)
RBC: 4.3 Mil/uL (ref 3.87–5.11)
RDW: 14.2 % (ref 11.5–15.5)
WBC: 6.3 10*3/uL (ref 4.0–10.5)

## 2021-06-04 LAB — COMPREHENSIVE METABOLIC PANEL
ALT: 12 U/L (ref 0–35)
AST: 18 U/L (ref 0–37)
Albumin: 4.1 g/dL (ref 3.5–5.2)
Alkaline Phosphatase: 74 U/L (ref 39–117)
BUN: 21 mg/dL (ref 6–23)
CO2: 23 mEq/L (ref 19–32)
Calcium: 9.4 mg/dL (ref 8.4–10.5)
Chloride: 105 mEq/L (ref 96–112)
Creatinine, Ser: 0.95 mg/dL (ref 0.40–1.20)
GFR: 66.06 mL/min (ref >60.00)
Glucose, Bld: 94 mg/dL (ref 70–99)
Potassium: 4.2 mEq/L (ref 3.5–5.1)
Sodium: 137 mEq/L (ref 135–145)
Total Bilirubin: 0.4 mg/dL (ref 0.2–1.2)
Total Protein: 7.3 g/dL (ref 6.0–8.3)

## 2021-06-04 LAB — HEMOGLOBIN A1C: Hgb A1c MFr Bld: 6 % (ref 4.6–6.5)

## 2021-06-04 LAB — TSH: TSH: 1.1 u[IU]/mL (ref 0.35–5.50)

## 2021-06-04 MED ORDER — METHOCARBAMOL 500 MG PO TABS: 500.0000 mg | ORAL_TABLET | Freq: Two times a day (BID) | ORAL | 1 refills | Status: DC | PRN

## 2021-06-04 MED ORDER — FLUCONAZOLE 150 MG PO TABS: 150.0000 mg | ORAL_TABLET | Freq: Once | ORAL | 0 refills | Status: AC

## 2021-06-04 MED ORDER — PROGESTERONE MICRONIZED 100 MG PO CAPS: 100.0000 mg | ORAL_CAPSULE | Freq: Every day | ORAL | 1 refills | Status: DC

## 2021-06-04 MED ORDER — NYSTATIN 100000 UNIT/GM EX OINT: 1.0000 "application " | TOPICAL_OINTMENT | Freq: Two times a day (BID) | CUTANEOUS | 0 refills | Status: AC

## 2021-06-04 MED ORDER — PANTOPRAZOLE SODIUM 40 MG PO TBEC: 40.0000 mg | DELAYED_RELEASE_TABLET | Freq: Every day | ORAL | 3 refills | Status: DC

## 2021-06-04 MED ORDER — DULOXETINE HCL 60 MG PO CPEP: 60.0000 mg | ORAL_CAPSULE | Freq: Every day | ORAL | 3 refills | Status: DC

## 2021-06-04 MED ORDER — HYDROXYZINE HCL 50 MG PO TABS: 50.0000 mg | ORAL_TABLET | Freq: Four times a day (QID) | ORAL | 0 refills | Status: DC | PRN

## 2021-06-04 NOTE — Patient Instructions (Signed)
Here is what I would recommend:  1) Increase the amount of water you drink a day > specifically drink a glass of water 8 oz or more before every meal 2) Could start a fiber supplement (like metamucil) > You could take this up to 3 times a day, but I would start with 1 time a day until you get used to it. It may cause some stomach upset 3) Make sure you sit down to eat and eat slowly (cut meat one piece at a time) > specifically train your body to eat only at the table (avoiding snacking in front of the TV) 4) Fill up on healthy items first > consider eating a salad with low calorie salad dressing before every meal  5) Make 1/2 of your plate vegetables 6) Keep healthy snacks available for those times when you are bored 7) Consider using a calorie counting app like MyFitnessPal > counting calories helps you make wise choices around snacking. Or if you can afford it you could sign up for Weight Watchers -- and learn about healthy options through a point system

## 2021-06-04 NOTE — Progress Notes (Signed)
Annual Exam   Chief Complaint:  Chief Complaint  Patient presents with   Annual Exam    Has several things to discuss. Has a GYN.    Possible Yeast Infection    External itching and irritation. Sweats al ot due to menopause. No vaginal discharge.    History of Present Illness:  Ms. [name] is a 60 y.o. No obstetric history on file. who LMP was Patient's last menstrual period was 07/05/2019., presents today for her annual examination.    #Itching/irritation - vaginal - no discharge - does itch and burn - but does not  - no urinary symptoms - profuse sweating - cotton underwear  #Skin tags - getting bigger   #weight gain - gained 50 lbs in 2 years - did lose weight down to 145 - weight was 160-170 for a couple of years - weight has gone up to 207 from ~175 in March 2021 - does not eat well - has been on cymbalta for several years  Wt Readings from Last 3 Encounters:  06/04/21 207 lb (93.9 kg)  03/13/21 206 lb 3.2 oz (93.5 kg)  01/11/21 202 lb 8 oz (91.9 kg)     Nutrition She does get adequate calcium and Vitamin D in her diet. Diet: meals are ok, but snacking, avoids high calorie beverages Exercise: a lot of gardening this summer, but has dropped   Safety The patient wears seatbelts: yes.     The patient feels safe at home and in their relationships: yes.   Menstrual:  Symptoms of menopause: sweating a lot, on estrogen/progesterone but no hot flashes   GYN She is single partner, contraception - post menopausal status.    Cervical Cancer Screening (21-65):   Last Pap:   January 2022 Results were: no abnormalities /neg HPV DNA   Breast Cancer Screening (Age 66-74):  There is no FH of breast cancer. There is no FH of ovarian cancer. BRCA screening Not Indicated.  Last Mammogram: 2022 The patient does want a mammogram this year.    Colon Cancer Screening:  Age 24-75 yo - benefits outweigh the risk. Adults 52-85 yo who have never been screened  benefit.  Benefits: 134000 people in 2016 will be diagnosed and 49,000 will die - early detection helps [place]: Complications 2/2 to colonoscopy High Risk (Colonoscopy): genetic disorder (Lynch syndrome or familial adenomatous polyposis), personal hx of IBD, previous adenomatous polyp, or previous colorectal cancer, FamHx start 10 years before the age at diagnosis, increased in males and black race  Options:  FIT - looks for hemoglobin (blood in the stool) - specific and fairly sensitive - must be done annually Cologuard - looks for DNA and blood - more sensitive - therefore can have more false positives, every 3 years Colonoscopy - every 10 years if normal - sedation, bowl prep, must have someone drive you  Shared decision making and the patient had decided to do colonoscopy 2024.   Social History   Tobacco Use  Smoking Status Never  Smokeless Tobacco Never    Lung Cancer Screening (Ages 47-65): not applicable   Weight Wt Readings from Last 3 Encounters:  06/04/21 207 lb (93.9 kg)  03/13/21 206 lb 3.2 oz (93.5 kg)  01/11/21 202 lb 8 oz (91.9 kg)   Patient has high BMI  BMI Readings from Last 1 Encounters:  06/04/21 29.91 kg/m     Chronic disease screening Blood pressure monitoring:  BP Readings from Last 3 Encounters:  06/04/21 102/74  03/13/21  111/76  01/11/21 92/76    Lipid Monitoring: Indication for screening: age >48, obesity, diabetes, family hx, CV risk factors.  Lipid screening: Yes  Lab Results  Component Value Date   CHOL 157 09/14/2018   HDL 71.10 09/14/2018   LDLCALC 74 09/14/2018   TRIG 60.0 09/14/2018   CHOLHDL 2 09/14/2018     Diabetes Screening: age >71, overweight, family hx, PCOS, hx of gestational diabetes, at risk ethnicity Diabetes Screening screening: Yes  Lab Results  Component Value Date   HGBA1C 5.6 10/25/2019     Past Medical History:  Diagnosis Date   Allergic rhinitis    Anxiety    Cervical dysplasia 1990   s/p  conization   Chronic sinusitis    Depression    Fibromyalgia    Hiatal hernia    Hip fracture, left ([place]) 2005   s/p ORIF   Hypothyroidism    Migraine    Neuropathy    Schatzki's ring     Past Surgical History:  Procedure Laterality Date   BREAST ENHANCEMENT SURGERY  2007   BREAST IMPLANT REMOVAL Bilateral 05/21/2018   DILATION AND CURETTAGE OF UTERUS  1989   ORIF HIP FRACTURE  2005   [facility]   TONSILLECTOMY AND ADENOIDECTOMY     TOTAL HIP ARTHROPLASTY Left 2005   second surgery     Prior to Admission medications   Medication Sig Start Date End Date Taking? Authorizing Provider  albuterol (VENTOLIN HFA) 108 (90 Base) MCG/ACT inhaler Inhale 2 puffs into the lungs every 4 (four) hours as needed for wheezing or shortness of breath. 07/26/19  Yes Hall-Potvin, [place], PA-C  ARMOUR THYROID 60 MG tablet TAKE 1 TABLET BY MOUTH DAILY BEFORE BREAKFAST 09/08/20  Yes Lesleigh Noe, MD  cetirizine (ZYRTEC) 10 MG tablet Take 10 mg by mouth at bedtime.   Yes [provider]  Cholecalciferol (VITAMIN D PO) Take by mouth daily.   Yes [provider]  DULoxetine (CYMBALTA) 60 MG capsule TAKE 1 CAPSULE BY MOUTH DAILY 05/23/21  Yes Lesleigh Noe, MD  estradiol (DOTTI) 0.075 MG/24HR Place 1 patch onto the skin 2 (two) times a week. 03/01/21  Yes Lesleigh Noe, MD  GAMMA AMINOBUTYRIC ACID PO Take 750 mg by mouth daily.   Yes [provider]  hydrOXYzine (ATARAX/VISTARIL) 50 MG tablet Take 1 tablet (50 mg total) by mouth every 6 (six) hours as needed. 10/22/18  Yes Lucille Passy, MD  Loteprednol Etabonate (EYSUVIS OP) Apply to eye as needed.   Yes [provider]  Magnesium Citrate 100 MG TABS Take 200 mg by mouth daily.   Yes [provider]  meclizine (ANTIVERT) 25 MG tablet Take 1 tablet (25 mg total) by mouth 3 (three) times daily as needed for dizziness. 06/14/19  Yes Lucille Passy, MD  Melatonin 10 MG TABS Take 10 mg by mouth at bedtime.   Yes  [provider]  methocarbamol (ROBAXIN) 500 MG tablet Take 1 tablet (500 mg total) by mouth 2 (two) times daily as needed for muscle spasms. 09/08/19  Yes Lucille Passy, MD  Omega-3 Fatty Acids (FISH OIL PO) Take by mouth daily.   Yes [provider]  OVER THE COUNTER MEDICATION Seleium 200 mg   Yes [provider]  pantoprazole (PROTONIX) 40 MG tablet Take 1 tablet (40 mg total) by mouth daily. 04/27/20  Yes Pyrtle, Lajuan Lines, MD  progesterone (PROMETRIUM) 100 MG capsule Take 100 mg by mouth at bedtime. 09/07/20  Yes [provider]  SUMAtriptan (IMITREX) 50 MG tablet Take 1 tablet (50 mg total) by mouth every 2 (two) hours as needed for migraine. May repeat in 2 hours if headache persists or recurs. 09/20/19  Yes Lucille Passy, MD  triamcinolone (KENALOG) 0.1 % Apply 1 application topically 2 (two) times daily. Patient taking differently: Apply 1 application topically as needed. 10/29/20  Yes Wieters, Hallie C, PA-C  TURMERIC CURCUMIN PO Take 1 tablet by mouth daily.   Yes [provider]  vitamin E 400 UNIT capsule Take 400 Units by mouth daily.   Yes [provider]    No Known Allergies  Gynecologic History: Patient's last menstrual period was 07/05/2019.  Obstetric History: No obstetric history on file.  Social History   Socioeconomic History   Marital status: Married    Spouse name: Timmothy Sours   Number of children: 2   Years of education: college   Highest education level: Not on file  Occupational History   Not on file  Tobacco Use   Smoking status: Never   Smokeless tobacco: Never  Vaping Use   Vaping Use: Never used  Substance and Sexual Activity   Alcohol use: Not Currently   Drug use: No   Sexual activity: Yes    Birth control/protection: Post-menopausal  Other Topics Concern   Not on file  Social History Narrative   11/29/19   From: raised in [place], but in the area for 25 years   Living: with husband, Timmothy Sours (1988)    Work: retired - considering returning to work      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 - grown adults - daughter in [place]      Enjoys: pets, photography, gardening, exercise, cooking, crafts, church activities      Exercise: cardio 3 days a week, weight lifting 2 times a week   Diet: not great, has reduced sugar      Safety   Seat belts: Yes    Guns: Yes  and secure   Safe in relationships: Yes    Social Determinants of Radio broadcast assistant Strain: Not on file  Food Insecurity: Not on file  Transportation Needs: Not on file  Physical Activity: Not on file  Stress: Not on file  Social Connections: Not on file  Intimate Partner Violence: Not on file    Family History  Problem Relation Age of Onset   Arthritis/Rheumatoid Mother        RA and OA   Asthma Mother    Breast cancer Maternal Aunt 48   Irregular heart beat Father        need ablation   Basal cell carcinoma Father    Asthma Father        exercise induced asthma    Arthritis Maternal Grandmother    Heart disease Maternal Grandmother    Lung cancer Paternal Grandfather    Drug abuse Brother    Alcohol abuse Brother    Appendicitis Son    Allergies Son    Migraines Son    Eczema Son    Healthy Son    Allergies Daughter    Healthy Daughter    Colon cancer Neg Hx    Pancreatic cancer Neg Hx    Stomach cancer Neg Hx    Esophageal cancer Neg Hx    Rectal cancer Neg Hx     Review of Systems  Constitutional:  Negative for chills and fever.  HENT:  Negative for congestion and sore  throat.   Eyes:  Negative for blurred vision and double vision.  Respiratory:  Negative for shortness of breath.   Cardiovascular:  Negative for chest pain.  Gastrointestinal:  Negative for heartburn, nausea and vomiting.  Genitourinary: Negative.  Negative for dysuria.       Vaginal itching  Musculoskeletal: Negative.  Negative for myalgias.  Skin:  Negative for rash.  Neurological:  Negative for dizziness and headaches.   Endo/Heme/Allergies:  Does not bruise/bleed easily.  Psychiatric/Behavioral:  Negative for depression. The patient is not nervous/anxious.     Physical Exam BP 102/74 (BP Location: Left Arm, Patient Position: Sitting, Cuff Size: Large)   Pulse 83   Temp (!) 97.5 F (36.4 C)   Ht 5' 9.75" (1.772 m)   Wt 207 lb (93.9 kg)   LMP 07/05/2019   SpO2 98%   BMI 29.91 kg/m    BP Readings from Last 3 Encounters:  06/04/21 102/74  03/13/21 111/76  01/11/21 92/76      Physical Exam Exam conducted with a chaperone present.  Constitutional:      General: She is not in acute distress.    Appearance: She is well-developed. She is not diaphoretic.  HENT:     Head: Normocephalic and atraumatic.     Right Ear: External ear normal.     Left Ear: External ear normal.     Nose: Nose normal.  Eyes:     General: No scleral icterus.    Extraocular Movements: Extraocular movements intact.     Conjunctiva/sclera: Conjunctivae normal.  Cardiovascular:     Rate and Rhythm: Normal rate and regular rhythm.     Heart sounds: No murmur heard. Pulmonary:     Effort: Pulmonary effort is normal. No respiratory distress.     Breath sounds: Normal breath sounds. No wheezing.  Abdominal:     General: Bowel sounds are normal. There is no distension.     Palpations: Abdomen is soft. There is no mass.     Tenderness: There is no abdominal tenderness. There is no guarding or rebound.  Genitourinary:    Pubic Area: Rash (erythema with satellite lesions along the perineum and extending the rectum) present.  Musculoskeletal:        General: Normal range of motion.     Cervical back: Neck supple.  Lymphadenopathy:     Cervical: No cervical adenopathy.  Skin:    General: Skin is warm and dry.     Capillary Refill: Capillary refill takes less than 2 seconds.  Neurological:     Mental Status: She is alert and oriented to person, place, and time.     Deep Tendon Reflexes: Reflexes normal.  Psychiatric:         Mood and Affect: Mood normal.        Behavior: Behavior normal.    Results:  PHQ-9:  [facility] Office Visit from 01/11/2021 in [facility] at [place]  PHQ-9 Total Score 5         Assessment: 60 y.o. No obstetric history on file. female here for routine annual physical examination.  Plan: Problem List Items Addressed This Visit       Nervous and Auditory   Peripheral neuropathy   Relevant Medications   DULoxetine (CYMBALTA) 60 MG capsule   methocarbamol (ROBAXIN) 500 MG tablet   hydrOXYzine (ATARAX/VISTARIL) 50 MG tablet     Genitourinary   Vaginal yeast infection    Trial of fluconazole but suspect she may need nystatin if does  not resolve which as been prescribed.       Relevant Medications   fluconazole (DIFLUCAN) 150 MG tablet   nystatin ointment (MYCOSTATIN)     Other   Depression   Relevant Medications   DULoxetine (CYMBALTA) 60 MG capsule   hydrOXYzine (ATARAX/VISTARIL) 50 MG tablet   Generalized anxiety disorder   Relevant Medications   DULoxetine (CYMBALTA) 60 MG capsule   hydrOXYzine (ATARAX/VISTARIL) 50 MG tablet   Fibromyalgia   Relevant Medications   DULoxetine (CYMBALTA) 60 MG capsule   methocarbamol (ROBAXIN) 500 MG tablet   Grief   Relevant Medications   DULoxetine (CYMBALTA) 60 MG capsule   Overweight (BMI 25.0-29.9)    30 lb weight gain. Advised calorie counting and diet. Return in 4 months if difficulty and could consider medications.       Other Visit Diagnoses     Annual physical exam    -  Primary   Relevant Orders   Comprehensive metabolic panel   Lipid panel   CBC   TSH   Hemoglobin A1c   Muscle spasm       Relevant Medications   methocarbamol (ROBAXIN) 500 MG tablet   Gastroesophageal reflux disease, unspecified whether esophagitis present       Relevant Medications   pantoprazole (PROTONIX) 40 MG tablet       Screening: -- Blood pressure screen normal -- cholesterol screening: will obtain --  Weight screening: overweight: continue to monitor -- Diabetes Screening: will obtain -- Nutrition: Encouraged healthy diet  The 10-year ASCVD risk score (Arnett DK, et al., 2019) is: 1.1%   Values used to calculate the score:     Age: 89 years     Sex: Female     Is Non-Hispanic African American: No     Diabetic: No     Tobacco smoker: No     Systolic Blood Pressure: 987 mmHg     Is BP treated: No     HDL Cholesterol: 71.1 mg/dL     Total Cholesterol: 157 mg/dL  -- Statin therapy for Age 12-75 with CVD risk >7.5%  Psych -- Depression screening (PHQ-9):  [facility] Visit from 01/11/2021 in [facility] at [facility]  PHQ-9 Total Score 5        Safety -- tobacco screening: not using -- alcohol screening:  low-risk usage. -- no evidence of domestic violence or intimate partner violence.   Cancer Screening -- pap smear not collected per ASCCP guidelines -- family history of breast cancer screening: done. not at high risk. -- Mammogram -  will request -- Colon cancer (age 69+)--  up to date  Immunizations Immunization History  Administered Date(s) Administered   Influenza,inj,Quad PF,6+ Mos 08/10/2014, 08/14/2015, 05/08/2016, 06/23/2017, 09/14/2018, 06/01/2019   Tdap 05/01/2016    -- flu vaccine not up to date - give today -- TDAP q10 years up to date -- Shingles (age >50) not up to date - will check at f/u  -- Covid-19 Vaccine not up to date - declined   Encouraged healthy diet and exercise. Encouraged regular vision and dental care.    Lesleigh Noe, MD

## 2021-06-04 NOTE — Addendum Note (Signed)
Addended by: Pilar Grammes on: 06/04/2021 03:23 PM   Modules accepted: Orders

## 2021-06-04 NOTE — Assessment & Plan Note (Signed)
Trial of fluconazole but suspect she may need nystatin if does not resolve which as been prescribed.

## 2021-06-04 NOTE — Assessment & Plan Note (Signed)
30 lb weight gain. Advised calorie counting and diet. Return in 4 months if difficulty and could consider medications.

## 2021-06-06 ENCOUNTER — Encounter: Payer: Self-pay | Admitting: Family Medicine

## 2021-06-06 DIAGNOSIS — R7303 Prediabetes: Secondary | ICD-10-CM

## 2021-06-07 MED ORDER — METFORMIN HCL 500 MG PO TABS: 500.0000 mg | ORAL_TABLET | Freq: Two times a day (BID) | ORAL | 1 refills | Status: DC

## 2021-06-09 ENCOUNTER — Encounter: Payer: Self-pay | Admitting: Family Medicine

## 2021-08-30 NOTE — Progress Notes (Deleted)
Office Visit Note  Patient: [name]             Date of Birth: February 26, 1961           MRN: [id number]             PCP: Lesleigh Noe, MD Referring: Lesleigh Noe, MD Visit Date: 09/13/2021 Occupation: @GUAROCC @  Subjective:  No chief complaint on file.   History of Present Illness: [name] is a 61 y.o. female ***   Activities of Daily Living:  Patient reports morning stiffness for *** {minute/hour:19697}.   Patient {ACTIONS;DENIES/REPORTS:21021675::"Denies"} nocturnal pain.  Difficulty dressing/grooming: {ACTIONS;DENIES/REPORTS:21021675::"Denies"} Difficulty climbing stairs: {ACTIONS;DENIES/REPORTS:21021675::"Denies"} Difficulty getting out of chair: {ACTIONS;DENIES/REPORTS:21021675::"Denies"} Difficulty using hands for taps, buttons, cutlery, and/or writing: {ACTIONS;DENIES/REPORTS:21021675::"Denies"}  No Rheumatology ROS completed.   PMFS History:  Patient Active Problem List   Diagnosis Date Noted   Vaginal yeast infection 06/04/2021   Overweight (BMI 25.0-29.9) 06/04/2021   Sjogren's disease ([place]) 01/11/2021   Excessive sweating, local 01/11/2021   Urethra disorder 01/11/2021   Dyspnea on exertion 01/11/2021   Wheezing 09/27/2020   Malar rash 09/27/2020   Dry mouth 09/27/2020   [place], initial encounter 03/50/0938   Eosinophilic esophagitis 18/29/9371   Menopause 11/29/2019   Prediabetes 10/25/2019   Chronic cough 10/18/2019   Grief 09/20/2019   Anxiety 09/08/2019   Fibromyalgia 09/08/2019   Bronchitis with acute wheezing 07/12/2019   Peripheral neuropathy 06/14/2019   BPV (benign positional vertigo), right 06/14/2019   Dust exposure 10/21/2018   Vitamin D deficiency 09/14/2018   Loose stools 09/14/2018   Fatigue 10/01/2017   Generalized anxiety disorder 11/21/2015   Polyarthralgia 08/14/2015   Vitamin B12 deficiency 08/10/2014   Insomnia 06/30/2012   Migraine with status migrainosus 05/23/2011   Depression    Hypothyroidism due  to Hashimoto's thyroiditis     Past Medical History:  Diagnosis Date   Allergic rhinitis    Anxiety    Cervical dysplasia 1990   s/p conization   Chronic sinusitis    Depression    Fibromyalgia    Hiatal hernia    Hip fracture, left ([place]) 2005   s/p ORIF   Hypothyroidism    Migraine    Neuropathy    Schatzki's ring     Family History  Problem Relation Age of Onset   Arthritis/Rheumatoid Mother        RA and OA   Asthma Mother    Breast cancer Maternal Aunt 48   Irregular heart beat Father        need ablation   Basal cell carcinoma Father    Asthma Father        exercise induced asthma    Arthritis Maternal Grandmother    Heart disease Maternal Grandmother    Lung cancer Paternal Grandfather    Drug abuse Brother    Alcohol abuse Brother    Appendicitis Son    Allergies Son    Migraines Son    Eczema Son    Healthy Son    Allergies Daughter    Healthy Daughter    Colon cancer Neg Hx    Pancreatic cancer Neg Hx    Stomach cancer Neg Hx    Esophageal cancer Neg Hx    Rectal cancer Neg Hx    Past Surgical History:  Procedure Laterality Date   BREAST ENHANCEMENT SURGERY  2007   BREAST IMPLANT REMOVAL Bilateral 05/21/2018   DILATION AND CURETTAGE OF UTERUS  1989   ORIF HIP  FRACTURE  2005   [facility]   TONSILLECTOMY AND ADENOIDECTOMY     TOTAL HIP ARTHROPLASTY Left 2005   second surgery    Social History   Social History Narrative   11/29/19   From: raised in [place], but in the area for 25 years   Living: with husband, Timmothy Sours (1988)   Work: retired - considering returning to work      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 - grown adults - daughter in [place]      Enjoys: pets, photography, gardening, exercise, cooking, crafts, church activities      Exercise: cardio 3 days a week, weight lifting 2 times a week   Diet: not great, has reduced sugar      Safety   Seat belts: Yes    Guns: Yes  and secure   Safe in relationships: Yes    Immunization History   Administered Date(s) Administered   Influenza,inj,Quad PF,6+ Mos 08/10/2014, 08/14/2015, 05/08/2016, 06/23/2017, 09/14/2018, 06/01/2019, 06/04/2021   Tdap 05/01/2016     Objective: Vital Signs: LMP 07/05/2019    Physical Exam   Musculoskeletal Exam: ***  CDAI Exam: CDAI Score: -- Patient Global: --; Provider Global: -- Swollen: --; Tender: -- Joint Exam 09/13/2021   No joint exam has been documented for this visit   There is currently no information documented on the homunculus. Go to the Rheumatology activity and complete the homunculus joint exam.  Investigation: No additional findings.  Imaging: No results found.  Recent Labs: Lab Results  Component Value Date   WBC 6.3 06/04/2021   HGB 13.5 06/04/2021   PLT 292.0 06/04/2021   NA 137 06/04/2021   K 4.2 06/04/2021   CL 105 06/04/2021   CO2 23 06/04/2021   GLUCOSE 94 06/04/2021   BUN 21 06/04/2021   CREATININE 0.95 06/04/2021   BILITOT 0.4 06/04/2021   ALKPHOS 74 06/04/2021   AST 18 06/04/2021   ALT 12 06/04/2021   PROT 7.3 06/04/2021   ALBUMIN 4.1 06/04/2021   CALCIUM 9.4 06/04/2021   GFRAA 74 11/08/2020    Speciality Comments: No specialty comments available.  Procedures:  No procedures performed Allergies: Patient has no known allergies.   Assessment / Plan:     Visit Diagnoses: No diagnosis found.  Orders: No orders of the defined types were placed in this encounter.  No orders of the defined types were placed in this encounter.   Face-to-face time spent with patient was *** minutes. Greater than 50% of time was spent in counseling and coordination of care.  Follow-Up Instructions: No follow-ups on file.   Earnestine Mealing, CMA  Note - This record has been created using Editor, commissioning.  Chart creation errors have been sought, but may not always  have been located. Such creation errors do not reflect on  the standard of medical care.

## 2021-09-03 ENCOUNTER — Other Ambulatory Visit: Payer: Self-pay | Admitting: Family Medicine

## 2021-09-13 ENCOUNTER — Ambulatory Visit: Payer: BC Managed Care – PPO | Admitting: Physician Assistant

## 2021-09-13 DIAGNOSIS — E559 Vitamin D deficiency, unspecified: Secondary | ICD-10-CM

## 2021-09-13 DIAGNOSIS — U071 COVID-19: Secondary | ICD-10-CM

## 2021-09-13 DIAGNOSIS — M797 Fibromyalgia: Secondary | ICD-10-CM

## 2021-09-13 DIAGNOSIS — R21 Rash and other nonspecific skin eruption: Secondary | ICD-10-CM

## 2021-09-13 DIAGNOSIS — E538 Deficiency of other specified B group vitamins: Secondary | ICD-10-CM

## 2021-09-13 DIAGNOSIS — G6289 Other specified polyneuropathies: Secondary | ICD-10-CM

## 2021-09-13 DIAGNOSIS — R5383 Other fatigue: Secondary | ICD-10-CM

## 2021-09-13 DIAGNOSIS — Z8261 Family history of arthritis: Secondary | ICD-10-CM

## 2021-09-13 DIAGNOSIS — M2240 Chondromalacia patellae, unspecified knee: Secondary | ICD-10-CM

## 2021-09-13 DIAGNOSIS — M545 Low back pain, unspecified: Secondary | ICD-10-CM

## 2021-09-13 DIAGNOSIS — R61 Generalized hyperhidrosis: Secondary | ICD-10-CM

## 2021-09-13 DIAGNOSIS — M3509 Sicca syndrome with other organ involvement: Secondary | ICD-10-CM

## 2021-09-13 DIAGNOSIS — Z96642 Presence of left artificial hip joint: Secondary | ICD-10-CM

## 2021-09-13 DIAGNOSIS — K200 Eosinophilic esophagitis: Secondary | ICD-10-CM

## 2021-09-13 DIAGNOSIS — E038 Other specified hypothyroidism: Secondary | ICD-10-CM

## 2021-09-13 DIAGNOSIS — H8111 Benign paroxysmal vertigo, right ear: Secondary | ICD-10-CM

## 2021-09-13 DIAGNOSIS — M19041 Primary osteoarthritis, right hand: Secondary | ICD-10-CM

## 2021-10-08 ENCOUNTER — Other Ambulatory Visit: Payer: Self-pay

## 2021-10-08 ENCOUNTER — Telehealth (INDEPENDENT_AMBULATORY_CARE_PROVIDER_SITE_OTHER): Payer: BC Managed Care – PPO | Admitting: Family Medicine

## 2021-10-08 ENCOUNTER — Encounter: Payer: Self-pay | Admitting: Family Medicine

## 2021-10-08 VITALS — HR 68 | Wt 204.0 lb

## 2021-10-08 DIAGNOSIS — J0140 Acute pansinusitis, unspecified: Secondary | ICD-10-CM | POA: Diagnosis not present

## 2021-10-08 DIAGNOSIS — B379 Candidiasis, unspecified: Secondary | ICD-10-CM | POA: Diagnosis not present

## 2021-10-08 DIAGNOSIS — T3695XA Adverse effect of unspecified systemic antibiotic, initial encounter: Secondary | ICD-10-CM

## 2021-10-08 MED ORDER — FLUCONAZOLE 150 MG PO TABS
150.0000 mg | ORAL_TABLET | Freq: Once | ORAL | 0 refills | Status: AC
Start: 2021-10-08 — End: 2021-10-08

## 2021-10-08 MED ORDER — AMOXICILLIN-POT CLAVULANATE 875-125 MG PO TABS: 1.0000 | ORAL_TABLET | Freq: Two times a day (BID) | ORAL | 0 refills | Status: AC

## 2021-10-08 NOTE — Progress Notes (Signed)
I connected with [name] on 10/08/21 at 10:40 AM EST by video and verified that I am speaking with the correct person using two identifiers.   I discussed the limitations, risks, security and privacy concerns of performing an evaluation and management service by video and the availability of in person appointments. I also discussed with the patient that there may be a patient responsible charge related to this service. The patient expressed understanding and agreed to proceed.  Patient location: Home Provider Location: [facility] Participants: Lesleigh Noe and Rowe Robert   Subjective:     [name] is a 61 y.o. female presenting for Nasal Congestion (Thick yellow/brownish discharge ) and Cough (Onset of sx: 3 weeks ago /Feels like it is moving down into chest. Has pain in breastbone )     Cough Associated symptoms include chest pain, headaches, postnasal drip and a sore throat (worse with speaking). Pertinent negatives include no chills, fever or myalgias.  Sinusitis This is a new problem. The current episode started 1 to 4 weeks ago. There has been no fever. Associated symptoms include congestion, coughing, headaches, sinus pressure and a sore throat (worse with speaking). Pertinent negatives include no chills.   3 weeks of symptoms but getting worse over the week Did not take a covid test recently  Treatment: mucinex, steroid nasal spray, neti pot, sinus medication  #groin sweating - triggering vaginal itching - will use the nystatin cream w/ improvement   Review of Systems  Constitutional:  Negative for chills and fever.  HENT:  Positive for congestion, dental problem, postnasal drip, sinus pressure, sinus pain and sore throat (worse with speaking).   Respiratory:  Positive for cough.        Chest congestion/pain with coughing  Cardiovascular:  Positive for chest pain.  Gastrointestinal:  Negative for diarrhea, nausea and vomiting.   Musculoskeletal:  Negative for arthralgias and myalgias.  Neurological:  Positive for headaches.    Social History   Tobacco Use  Smoking Status Never  Smokeless Tobacco Never        Objective:   BP Readings from Last 3 Encounters:  06/04/21 102/74  03/13/21 111/76  01/11/21 92/76   Wt Readings from Last 3 Encounters:  10/08/21 204 lb (92.5 kg)  06/04/21 207 lb (93.9 kg)  03/13/21 206 lb 3.2 oz (93.5 kg)   Pulse 68    Wt 204 lb (92.5 kg)    LMP 07/05/2019    SpO2 99%    BMI 29.48 kg/m   Physical Exam Constitutional:      Appearance: Normal appearance. She is not ill-appearing.  HENT:     Head: Normocephalic and atraumatic.     Right Ear: External ear normal.     Left Ear: External ear normal.  Eyes:     Conjunctiva/sclera: Conjunctivae normal.  Pulmonary:     Effort: Pulmonary effort is normal. No respiratory distress.  Neurological:     Mental Status: She is alert. Mental status is at baseline.  Psychiatric:        Mood and Affect: Mood normal.        Behavior: Behavior normal.        Thought Content: Thought content normal.        Judgment: Judgment normal.            Assessment & Plan:   Problem List Items Addressed This Visit   None Visit Diagnoses     Acute non-recurrent pansinusitis    -  Primary   Relevant Medications   amoxicillin-clavulanate (AUGMENTIN) 875-125 MG tablet   fluconazole (DIFLUCAN) 150 MG tablet   Antibiotic-induced yeast infection       Relevant Medications   fluconazole (DIFLUCAN) 150 MG tablet      Symptoms consistent with sinus infection Abx Continue saline rinse, nasal steroid, and mucinex  Fluconazole for yeast infection if it occurs  Return if symptoms worsen or fail to improve.  Lesleigh Noe, MD

## 2021-11-13 ENCOUNTER — Other Ambulatory Visit: Payer: Self-pay | Admitting: Family Medicine

## 2021-12-03 ENCOUNTER — Other Ambulatory Visit: Payer: Self-pay | Admitting: Family Medicine

## 2021-12-03 DIAGNOSIS — R7303 Prediabetes: Secondary | ICD-10-CM

## 2021-12-24 ENCOUNTER — Ambulatory Visit (INDEPENDENT_AMBULATORY_CARE_PROVIDER_SITE_OTHER)
Admission: RE | Admit: 2021-12-24 | Discharge: 2021-12-24 | Disposition: A | Discharge status: Home / Self Care | Payer: BC Managed Care – PPO | Source: Ambulatory Visit | Attending: Family Medicine | Admitting: Family Medicine

## 2021-12-24 ENCOUNTER — Encounter: Payer: Self-pay | Admitting: Family Medicine

## 2021-12-24 ENCOUNTER — Ambulatory Visit: Payer: BC Managed Care – PPO | Admitting: Family Medicine

## 2021-12-24 VITALS — BP 118/80 | HR 73 | Ht 70.0 in | Wt 209.4 lb

## 2021-12-24 DIAGNOSIS — R0609 Other forms of dyspnea: Secondary | ICD-10-CM | POA: Diagnosis not present

## 2021-12-24 DIAGNOSIS — R0602 Shortness of breath: Secondary | ICD-10-CM | POA: Diagnosis not present

## 2021-12-24 DIAGNOSIS — R079 Chest pain, unspecified: Secondary | ICD-10-CM

## 2021-12-24 NOTE — Progress Notes (Signed)
? ?Subjective:  ? ? Patient ID: [name], female    DOB: 09-11-1960, 61 y.o.   MRN: [id number] ? ?HPI ?61 yo pt of Dr Einar Pheasant presents with chest discomfort for several months  ? ?Wt Readings from Last 3 Encounters:  ?12/24/21 209 lb 6.4 oz (95 kg)  ?10/08/21 204 lb (92.5 kg)  ?06/04/21 207 lb (93.9 kg)  ? ?30.05 kg/m? ? ?Gained 50 lb in 3 years as well  ? ?Pain started in January   ?Had fallen after pulling vines from a tree and landed on her back  ?At the moment did not thinks she broke a rib  ? ? ?She has h/o sjogren's syndrome and fibromyalgia   ?Also eosinophilic esophagitis and nasal allergies and chronic cough ?Sees GI and allergy ? ?She has a rheum appt on 5/9 ? ?Takes protonix 40 mg daily ?Cymbalta 60 mg daily  ? ?Was seen virtually for sinusitis in feb and mentioned some chest symptoms then  ? ?Nl ETT in 20154 ?EKG today no changes : NSR rate of 67 with RSR V1 and low voltage  ?Similar to a year ago  ? ?Dull to sharp pain  ?Worse on L side that is new)  ?At times more aching -when coughing  ?Occ hurts to take a deep breath/not always  ? ?Muscle relaxer does not help ?Sob - has gone on longer/more with exertion  (mild exertion)  ?Cannot do much physically  ? ?Constantly has thick mucous  ?Light brown in color  ?Throat was sore last night -now just irritated from clearing throat  ? ?Takes tylenol  ?Occ she takes ibuprofen (not supposed to take it)  ? ? ?BP Readings from Last 3 Encounters:  ?12/24/21 118/80  ?06/04/21 102/74  ?03/13/21 111/76  ? ?Pulse Readings from Last 3 Encounters:  ?12/24/21 73  ?10/08/21 68  ?06/04/21 83  ? ? ?Cholesterol  ?Lab Results  ?Component Value Date  ? CHOL 217 (H) 06/04/2021  ? HDL 79.30 06/04/2021  ? LDLCALC 118 (H) 06/04/2021  ? TRIG 100.0 06/04/2021  ? CHOLHDL 3 06/04/2021  ? ?2020 had covid  ?Then chronic sob/ wt gain and chronic cough (cough is better but has chronic throat clearing)  ? ? ?Patient Active Problem List  ? Diagnosis Date Noted  ? Chest pain 12/24/2021   ? Vaginal yeast infection 06/04/2021  ? Overweight (BMI 25.0-29.9) 06/04/2021  ? Sjogren's disease ([place]) 01/11/2021  ? Excessive sweating, local 01/11/2021  ? Urethra disorder 01/11/2021  ? Dyspnea on exertion 01/11/2021  ? Wheezing 09/27/2020  ? Malar rash 09/27/2020  ? Dry mouth 09/27/2020  ? Pernio, initial encounter 09/27/2020  ? Eosinophilic esophagitis 62/83/1517  ? Menopause 11/29/2019  ? Prediabetes 10/25/2019  ? Chronic cough 10/18/2019  ? Grief 09/20/2019  ? Anxiety 09/08/2019  ? Fibromyalgia 09/08/2019  ? Bronchitis with acute wheezing 07/12/2019  ? Peripheral neuropathy 06/14/2019  ? BPV (benign positional vertigo), right 06/14/2019  ? Dust exposure 10/21/2018  ? Vitamin D deficiency 09/14/2018  ? Loose stools 09/14/2018  ? Fatigue 10/01/2017  ? Generalized anxiety disorder 11/21/2015  ? Polyarthralgia 08/14/2015  ? Vitamin B12 deficiency 08/10/2014  ? Insomnia 06/30/2012  ? Migraine with status migrainosus 05/23/2011  ? Depression   ? Hypothyroidism due to Hashimoto's thyroiditis   ? ?Past Medical History:  ?Diagnosis Date  ? Allergic rhinitis   ? Anxiety   ? Cervical dysplasia 1990  ? s/p conization  ? Chronic sinusitis   ? Depression   ? Fibromyalgia   ?  Hiatal hernia   ? Hip fracture, left ([place]) 2005  ? s/p ORIF  ? Hypothyroidism   ? Migraine   ? Neuropathy   ? Schatzki's ring   ? ?Past Surgical History:  ?Procedure Laterality Date  ? BREAST ENHANCEMENT SURGERY  2007  ? BREAST IMPLANT REMOVAL Bilateral 05/21/2018  ? [place] OF UTERUS  1989  ? ORIF HIP FRACTURE  2005  ? [place]  ? TONSILLECTOMY AND ADENOIDECTOMY    ? TOTAL HIP ARTHROPLASTY Left 2005  ? second surgery   ? ?Social History  ? ?Tobacco Use  ? Smoking status: Never  ? Smokeless tobacco: Never  ?Vaping Use  ? Vaping Use: Never used  ?Substance Use Topics  ? Alcohol use: Not Currently  ? Drug use: No  ? ?Family History  ?Problem Relation Age of Onset  ? Arthritis/Rheumatoid Mother   ?     RA and OA  ? Asthma Mother   ? Breast  cancer Maternal Aunt 48  ? Irregular heart beat Father   ?     need ablation  ? Basal cell carcinoma Father   ? Asthma Father   ?     exercise induced asthma   ? Arthritis Maternal Grandmother   ? Heart disease Maternal Grandmother   ? Lung cancer Paternal Grandfather   ? Drug abuse Brother   ? Alcohol abuse Brother   ? Appendicitis Son   ? Allergies Son   ? Migraines Son   ? Eczema Son   ? Healthy Son   ? Allergies Daughter   ? Healthy Daughter   ? Colon cancer Neg Hx   ? Pancreatic cancer Neg Hx   ? Stomach cancer Neg Hx   ? Esophageal cancer Neg Hx   ? Rectal cancer Neg Hx   ? ?No Known Allergies ?Current Outpatient Medications on File Prior to Visit  ?Medication Sig Dispense Refill  ? albuterol (VENTOLIN HFA) 108 (90 Base) MCG/ACT inhaler Inhale 2 puffs into the lungs every 4 (four) hours as needed for wheezing or shortness of breath. 18 g 0  ? ARMOUR THYROID 60 MG tablet TAKE 1 TABLET BY MOUTH DAILY BEFORE BREAKFAST 90 tablet 3  ? cetirizine (ZYRTEC) 10 MG tablet Take 10 mg by mouth at bedtime.    ? Cholecalciferol (VITAMIN D PO) Take by mouth daily.    ? DOTTI 0.075 MG/24HR PLACE 1 PATCH ONTO THE SKIN TWICE WEEKLY 24 patch 1  ? DULoxetine (CYMBALTA) 60 MG capsule Take 1 capsule (60 mg total) by mouth daily. 90 capsule 3  ? GAMMA AMINOBUTYRIC ACID PO Take 750 mg by mouth daily.    ? hydrOXYzine (ATARAX/VISTARIL) 50 MG tablet Take 1 tablet (50 mg total) by mouth every 6 (six) hours as needed. 30 tablet 0  ? Loteprednol Etabonate (EYSUVIS OP) Apply to eye as needed.    ? Magnesium Citrate 100 MG TABS Take 200 mg by mouth daily.    ? meclizine (ANTIVERT) 25 MG tablet Take 1 tablet (25 mg total) by mouth 3 (three) times daily as needed for dizziness. 30 tablet 0  ? Melatonin 10 MG TABS Take 10 mg by mouth at bedtime.    ? metFORMIN (GLUCOPHAGE) 500 MG tablet TAKE 1 TABLET BY MOUTH TWICE DAILY WITH A MEAL 180 tablet 1  ? methocarbamol (ROBAXIN) 500 MG tablet Take 1 tablet (500 mg total) by mouth 2 (two) times daily  as needed for muscle spasms. 60 tablet 1  ? nystatin ointment (MYCOSTATIN)  Apply 1 application topically 2 (two) times daily. 30 g 0  ? Omega-3 Fatty Acids (FISH OIL PO) Take by mouth daily.    ? OVER THE COUNTER MEDICATION Seleium 200 mg    ? pantoprazole (PROTONIX) 40 MG tablet Take 1 tablet (40 mg total) by mouth daily. 90 tablet 3  ? progesterone (PROMETRIUM) 100 MG capsule TAKE 1 CAPSULE BY MOUTH EACH NIGHT AT BEDTIME 90 capsule 0  ? SUMAtriptan (IMITREX) 50 MG tablet Take 1 tablet (50 mg total) by mouth every 2 (two) hours as needed for migraine. May repeat in 2 hours if headache persists or recurs. 10 tablet 5  ? triamcinolone (KENALOG) 0.1 % Apply 1 application topically 2 (two) times daily. (Patient taking differently: Apply 1 application. topically as needed.) 30 g 0  ? TURMERIC CURCUMIN PO Take 1 tablet by mouth daily.    ? vitamin E 400 UNIT capsule Take 400 Units by mouth daily.    ? ?No current facility-administered medications on file prior to visit.  ?  ? ?Review of Systems  ?Constitutional:  Positive for fatigue. Negative for activity change, appetite change, fever and unexpected weight change.  ?HENT:  Negative for congestion, ear pain, rhinorrhea, sinus pressure and sore throat.   ?Eyes:  Negative for pain, redness and visual disturbance.  ?Respiratory:  Positive for cough and shortness of breath. Negative for apnea, choking, wheezing and stridor.   ?Cardiovascular:  Positive for chest pain. Negative for palpitations and leg swelling.  ?Gastrointestinal:  Negative for abdominal pain, blood in stool, constipation and diarrhea.  ?Endocrine: Negative for polydipsia and polyuria.  ?Genitourinary:  Negative for dysuria, frequency and urgency.  ?Musculoskeletal:  Positive for arthralgias and myalgias. Negative for back pain and joint swelling.  ?Skin:  Negative for pallor and rash.  ?Allergic/Immunologic: Negative for environmental allergies.  ?Neurological:  Negative for dizziness, syncope and  headaches.  ?Hematological:  Negative for adenopathy. Does not bruise/bleed easily.  ?Psychiatric/Behavioral:  Negative for decreased concentration and dysphoric mood. The patient is not nervous/anxious.   ?

## 2021-12-24 NOTE — Assessment & Plan Note (Addendum)
Sternal pain for several months with chronic sob /cough  ?Pt has h/o eosinophillic esophagitis and also sjogren's and also fibromyalgia and poss msk injury after fall backward in [place] ?Reviewed chart incl GI, allergy, pcp and rheum notes  ?Suspect this is multi factorial  ?EKG is baseline today, also had nl ETT in 2015  ?Nl vitals/no hypoxia or signs of PE ?Today exam is reassuring / there is constant throat clearing however ?cxr ordered  ?Recommend f/u with rheumatologist on 5/9 as planned  ?Do wonder if some inflammation linked to rheum hx or even fibromyalgia may play a role ?Also if poss GERD (is already on protonix)  ?Did enc her to wear a mask in the pollen ?

## 2021-12-24 NOTE — Progress Notes (Signed)
? ?Office Visit Note ? ?Patient: [name]             ?Date of Birth: 1961/04/07           ?MRN: [id number]             ?PCP: Lesleigh Noe, MD ?Referring: Lesleigh Noe, MD ?Visit Date: 01/01/2022 ?Occupation: '@GUAROCC'$ @ ? ?Subjective:  ?Generalized pain  ? ?History of Present Illness: [name] is a 61 y.o. female with history of Sjogren's syndrome, osteoarthritis, and fibromyalgia.  Patient is not currently taking any immunosuppressive agents.  She continues to have chronic sicca symptoms which have been tolerable overall.  Over the past 5 months she has been experiencing increased chest pain, breathlessness, and shortness of breath on exertion.  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  She states that her chest pain is most severe when coughing or sneezing but is difficult to reproduce otherwise.  She states that she was previously treated for reflux with Prilosec.  She states that she has also been diagnosed with eosinophilic esophagitis in the past. ?She states that she was evaluated by her PCP due to the capacity of chest pain and shortness of breath on exertion.  She states that she had a chest x-ray and an updated EKG on 12/24/2021 which were unremarkable overall.  She is concerned that her symptoms may be related to Sjogren's syndrome. ?She has been experiencing increased pain in multiple joints including both elbows, both knees, both ankle joints.  She states that her ankle joint pain is new.  She has not noticed any obvious joint swelling.  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 ? ? ? ? ?Activities of Daily Living:  ?Patient reports morning stiffness for several  hours.   ?Patient Reports nocturnal pain.  ?Difficulty dressing/grooming: Reports ?Difficulty climbing  stairs: Reports ?Difficulty getting out of chair: Reports ?Difficulty using hands for taps, buttons, cutlery, and/or writing: Reports ? ?Review of Systems  ?Constitutional:  Positive for fatigue.  ?HENT:  Positive for mouth dryness. Negative for mouth sores and nose dryness.   ?Eyes:  Positive for dryness. Negative for pain, itching and visual disturbance.  ?Respiratory:  Positive for shortness of breath. Negative for cough, hemoptysis and difficulty breathing.   ?Cardiovascular:  Positive for chest pain. Negative for palpitations, hypertension and swelling in legs/feet.  ?Gastrointestinal:  Positive for constipation. Negative for blood in stool and diarrhea.  ?Endocrine: Negative for increased urination.  ?Genitourinary:  Negative for difficulty urinating and painful urination.  ?Musculoskeletal:  Positive for joint pain, joint pain, myalgias, morning stiffness, muscle tenderness and myalgias. Negative for joint swelling and muscle weakness.  ?Skin:  Positive for rash. Negative for color change, pallor, hair loss, nodules/bumps, skin tightness, ulcers and sensitivity to sunlight.  ?Allergic/Immunologic: Positive for susceptible to infections.  ?Neurological:  Positive for dizziness, numbness, parasthesias and memory loss. Negative for headaches and weakness.  ?Hematological:  Positive for bruising/bleeding tendency. Negative for swollen glands.  ?Psychiatric/Behavioral:  Negative for depressed mood, confusion and sleep disturbance. The patient is not nervous/anxious.   ? ?PMFS History:  ?Patient Active Problem List  ? Diagnosis Date Noted  ? Chest pain 12/24/2021  ? Vaginal yeast infection 06/04/2021  ? Overweight (BMI 25.0-29.9) 06/04/2021  ? Sjogren's disease ([place]) 01/11/2021  ?  Excessive sweating, local 01/11/2021  ? Urethra disorder 01/11/2021  ? Dyspnea on exertion 01/11/2021  ? Wheezing 09/27/2020  ? Malar rash 09/27/2020  ? Dry mouth 09/27/2020  ? Pernio, initial encounter 09/27/2020  ? Eosinophilic  esophagitis 76/22/6333  ? Menopause 11/29/2019  ? Prediabetes 10/25/2019  ? Chronic cough 10/18/2019  ? Grief 09/20/2019  ? Anxiety 09/08/2019  ? Fibromyalgia 09/08/2019  ? Bronchitis with acute wheezing 07/12/2019  ? Peripheral neuropathy 06/14/2019  ? BPV (benign positional vertigo), right 06/14/2019  ? Dust exposure 10/21/2018  ? Vitamin D deficiency 09/14/2018  ? Loose stools 09/14/2018  ? Fatigue 10/01/2017  ? Generalized anxiety disorder 11/21/2015  ? Polyarthralgia 08/14/2015  ? Vitamin B12 deficiency 08/10/2014  ? Insomnia 06/30/2012  ? Migraine with status migrainosus 05/23/2011  ? Depression   ? Hypothyroidism due to Hashimoto's thyroiditis   ?  ?Past Medical History:  ?Diagnosis Date  ? Allergic rhinitis   ? Anxiety   ? Cervical dysplasia 1990  ? s/p conization  ? Chronic sinusitis   ? Depression   ? Fibromyalgia   ? Hiatal hernia   ? Hip fracture, left ([place]) 2005  ? s/p ORIF  ? Hypothyroidism   ? Migraine   ? Neuropathy   ? Schatzki's ring   ?  ?Family History  ?Problem Relation Age of Onset  ? Arthritis/Rheumatoid Mother   ?     RA and OA  ? Asthma Mother   ? Breast cancer Maternal Aunt 48  ? Irregular heart beat Father   ?     need ablation  ? Basal cell carcinoma Father   ? Asthma Father   ?     exercise induced asthma   ? Arthritis Maternal Grandmother   ? Heart disease Maternal Grandmother   ? Lung cancer Paternal Grandfather   ? Drug abuse Brother   ? Alcohol abuse Brother   ? Appendicitis Son   ? Allergies Son   ? Migraines Son   ? Eczema Son   ? Healthy Son   ? Allergies Daughter   ? Healthy Daughter   ? Colon cancer Neg Hx   ? Pancreatic cancer Neg Hx   ? Stomach cancer Neg Hx   ? Esophageal cancer Neg Hx   ? Rectal cancer Neg Hx   ? ?Past Surgical History:  ?Procedure Laterality Date  ? BREAST ENHANCEMENT SURGERY  2007  ? BREAST IMPLANT REMOVAL Bilateral 05/21/2018  ? [place] OF UTERUS  1989  ? ORIF HIP FRACTURE  2005  ? [place]  ? TONSILLECTOMY AND ADENOIDECTOMY    ? TOTAL HIP  ARTHROPLASTY Left 2005  ? second surgery   ? ?Social History  ? ?Social History Narrative  ? 11/29/19  ? From: raised in [place], but in the area for 25 years  ? Living: with husband, Timmothy Sours [phone])  ? Work: retired - considering returning to work  ?   ? Family: Jarrett Soho and [name] - grown adults - daughter in [place] school  ?   ? Enjoys: pets, photography, gardening, exercise, cooking, crafts, church activities  ?   ? Exercise: cardio 3 days a week, weight lifting 2 times a week  ? Diet: not great, has reduced sugar  ?   ? Safety  ? Seat belts: Yes   ? Guns: Yes  and secure  ? Safe in relationships: Yes   ? ?Immunization History  ?Administered Date(s) Administered  ? Influenza,inj,Quad PF,6+ Mos 08/10/2014, 08/14/2015, 05/08/2016, 06/23/2017, 09/14/2018, 06/01/2019, 06/04/2021  ?  Tdap 05/01/2016  ?  ? ?Objective: ?Vital Signs: BP 140/84 (BP Location: Left Arm, Patient Position: Sitting, Cuff Size: Normal)   Pulse 76   Ht '5\' 10"'$  (1.778 m)   Wt 210 lb (95.3 kg)   LMP 07/05/2019   BMI 30.13 kg/m?   ? ?Physical Exam ?Vitals and nursing note reviewed.  ?Constitutional:   ?   Appearance: She is well-developed.  ?HENT:  ?   Head: Normocephalic and atraumatic.  ?Eyes:  ?   Conjunctiva/sclera: Conjunctivae normal.  ?Cardiovascular:  ?   Rate and Rhythm: Normal rate and regular rhythm.  ?   Heart sounds: Normal heart sounds.  ?Pulmonary:  ?   Effort: Pulmonary effort is normal.  ?   Breath sounds: Normal breath sounds.  ?Abdominal:  ?   General: Bowel sounds are normal.  ?   Palpations: Abdomen is soft.  ?Musculoskeletal:  ?   Cervical back: Normal range of motion.  ?Skin: ?   General: Skin is warm and dry.  ?   Capillary Refill: Capillary refill takes less than 2 seconds.  ?Neurological:  ?   Mental Status: She is alert and oriented to person, place, and time.  ?Psychiatric:     ?   Behavior: Behavior normal.  ?  ? ?Musculoskeletal Exam: C-spine, thoracic spine, lumbar spine have good range of motion.  Some trapezius muscle  tension and tenderness bilaterally.  Shoulder joints have good range of motion with no discomfort.  Some tenderness along the elbow joint line bilaterally.  Wrist joints, MCPs, PIPs, DIPs have good range of motion with no

## 2021-12-24 NOTE — Patient Instructions (Addendum)
Ekg looks stable  ? ?Chest xray now  ? ?Follow up with rheumatologist as planned  ? ?Use your albuterol as needed ?Continue the zyrtec  ?Also the protoinx  ?Use a mask or gator when you work outdoors in the pollen  ? ? ? ? ?

## 2022-01-01 ENCOUNTER — Ambulatory Visit: Payer: BC Managed Care – PPO | Admitting: Physician Assistant

## 2022-01-01 ENCOUNTER — Ambulatory Visit: Payer: BC Managed Care – PPO | Admitting: Family Medicine

## 2022-01-01 ENCOUNTER — Encounter: Payer: Self-pay | Admitting: Physician Assistant

## 2022-01-01 VITALS — BP 140/84 | HR 76 | Ht 70.0 in | Wt 210.0 lb

## 2022-01-01 DIAGNOSIS — Z96642 Presence of left artificial hip joint: Secondary | ICD-10-CM

## 2022-01-01 DIAGNOSIS — M545 Low back pain, unspecified: Secondary | ICD-10-CM

## 2022-01-01 DIAGNOSIS — M2240 Chondromalacia patellae, unspecified knee: Secondary | ICD-10-CM

## 2022-01-01 DIAGNOSIS — R0681 Apnea, not elsewhere classified: Secondary | ICD-10-CM | POA: Diagnosis not present

## 2022-01-01 DIAGNOSIS — E538 Deficiency of other specified B group vitamins: Secondary | ICD-10-CM

## 2022-01-01 DIAGNOSIS — E559 Vitamin D deficiency, unspecified: Secondary | ICD-10-CM

## 2022-01-01 DIAGNOSIS — G6289 Other specified polyneuropathies: Secondary | ICD-10-CM

## 2022-01-01 DIAGNOSIS — M797 Fibromyalgia: Secondary | ICD-10-CM

## 2022-01-01 DIAGNOSIS — M6281 Muscle weakness (generalized): Secondary | ICD-10-CM | POA: Diagnosis not present

## 2022-01-01 DIAGNOSIS — R29898 Other symptoms and signs involving the musculoskeletal system: Secondary | ICD-10-CM

## 2022-01-01 DIAGNOSIS — M19042 Primary osteoarthritis, left hand: Secondary | ICD-10-CM

## 2022-01-01 DIAGNOSIS — R21 Rash and other nonspecific skin eruption: Secondary | ICD-10-CM

## 2022-01-01 DIAGNOSIS — H8111 Benign paroxysmal vertigo, right ear: Secondary | ICD-10-CM

## 2022-01-01 DIAGNOSIS — Z8261 Family history of arthritis: Secondary | ICD-10-CM | POA: Diagnosis not present

## 2022-01-01 DIAGNOSIS — E038 Other specified hypothyroidism: Secondary | ICD-10-CM

## 2022-01-01 DIAGNOSIS — M3509 Sicca syndrome with other organ involvement: Secondary | ICD-10-CM | POA: Diagnosis not present

## 2022-01-01 DIAGNOSIS — R61 Generalized hyperhidrosis: Secondary | ICD-10-CM

## 2022-01-01 DIAGNOSIS — G8929 Other chronic pain: Secondary | ICD-10-CM

## 2022-01-01 DIAGNOSIS — E063 Autoimmune thyroiditis: Secondary | ICD-10-CM

## 2022-01-01 DIAGNOSIS — R0789 Other chest pain: Secondary | ICD-10-CM

## 2022-01-01 DIAGNOSIS — M19041 Primary osteoarthritis, right hand: Secondary | ICD-10-CM | POA: Diagnosis not present

## 2022-01-01 DIAGNOSIS — U071 COVID-19: Secondary | ICD-10-CM

## 2022-01-01 DIAGNOSIS — K200 Eosinophilic esophagitis: Secondary | ICD-10-CM

## 2022-01-01 DIAGNOSIS — R5383 Other fatigue: Secondary | ICD-10-CM

## 2022-01-02 NOTE — Progress Notes (Signed)
UA revealed Hgb is 2+, 10-20 RBCs.  Please clarify if the patient still has her menstrual cycle?  ? ?CBC and CMP WNL.  CK WNL.  Complements and ESR WNL.  RF negative.

## 2022-01-03 LAB — COMPLETE METABOLIC PANEL WITH GFR
AG Ratio: 1.3 (calc) (ref 1.0–2.5)
ALT: 13 U/L (ref 6–29)
AST: 16 U/L (ref 10–35)
Albumin: 4.5 g/dL (ref 3.6–5.1)
Alkaline phosphatase (APISO): 76 U/L (ref 37–153)
BUN: 23 mg/dL (ref 7–25)
CO2: 22 mmol/L (ref 20–32)
Calcium: 9.8 mg/dL (ref 8.6–10.4)
Chloride: 103 mmol/L (ref 98–110)
Creat: 0.82 mg/dL (ref 0.50–1.03)
Globulin: 3.5 g/dL (calc) (ref 1.9–3.7)
Glucose, Bld: 83 mg/dL (ref 65–99)
Potassium: 4.6 mmol/L (ref 3.5–5.3)
Sodium: 136 mmol/L (ref 135–146)
Total Bilirubin: 0.4 mg/dL (ref 0.2–1.2)
Total Protein: 8 g/dL (ref 6.1–8.1)
eGFR: 82 mL/min/{1.73_m2} (ref >=60)

## 2022-01-03 LAB — CBC WITH DIFFERENTIAL/PLATELET
Absolute Monocytes: 496 cells/uL (ref 200–950)
Basophils Absolute: 93 cells/uL (ref 0–200)
Basophils Relative: 1.5 %
Eosinophils Absolute: 366 cells/uL (ref 15–500)
Eosinophils Relative: 5.9 %
HCT: 42.3 % (ref 35.0–45.0)
Hemoglobin: 13.9 g/dL (ref 11.7–15.5)
Lymphs Abs: 1978 cells/uL (ref 850–3900)
MCH: 30.2 pg (ref 27.0–33.0)
MCHC: 32.9 g/dL (ref 32.0–36.0)
MCV: 92 fL (ref 80.0–100.0)
MPV: 9.9 fL (ref 7.5–12.5)
Monocytes Relative: 8 %
Neutro Abs: 3267 cells/uL (ref 1500–7800)
Neutrophils Relative %: 52.7 %
Platelets: 291 10*3/uL (ref 140–400)
RBC: 4.6 10*6/uL (ref 3.80–5.10)
RDW: 13.3 % (ref 11.0–15.0)
Total Lymphocyte: 31.9 %
WBC: 6.2 10*3/uL (ref 3.8–10.8)

## 2022-01-03 LAB — URINALYSIS, ROUTINE W REFLEX MICROSCOPIC
Bacteria, UA: NONE SEEN /HPF
Bilirubin Urine: NEGATIVE
Glucose, UA: NEGATIVE
Hyaline Cast: NONE SEEN /LPF
Ketones, ur: NEGATIVE
Leukocytes,Ua: NEGATIVE
Nitrite: NEGATIVE
Protein, ur: NEGATIVE
Specific Gravity, Urine: 1.027 (ref 1.001–1.035)
WBC, UA: NONE SEEN /HPF (ref 0–5)
pH: 6 (ref 5.0–8.0)

## 2022-01-03 LAB — ANTI-NUCLEAR AB-TITER (ANA TITER): ANA Titer 1: 1:160 {titer} — ABNORMAL HIGH

## 2022-01-03 LAB — SEDIMENTATION RATE: Sed Rate: 6 mm/h (ref 0–30)

## 2022-01-03 LAB — PROTEIN ELECTROPHORESIS, SERUM, WITH REFLEX
Albumin ELP: 4.4 g/dL (ref 3.8–4.8)
Alpha 1: 0.3 g/dL (ref 0.2–0.3)
Alpha 2: 0.9 g/dL (ref 0.5–0.9)
Beta 2: 0.3 g/dL (ref 0.2–0.5)
Beta Globulin: 0.4 g/dL (ref 0.4–0.6)
Gamma Globulin: 1.4 g/dL (ref 0.8–1.7)
Total Protein: 7.6 g/dL (ref 6.1–8.1)

## 2022-01-03 LAB — ANA: Anti Nuclear Antibody (ANA): POSITIVE — AB

## 2022-01-03 LAB — SJOGRENS SYNDROME-B EXTRACTABLE NUCLEAR ANTIBODY: SSB (La) (ENA) Antibody, IgG: <1 AI

## 2022-01-03 LAB — CK: Total CK: 48 U/L (ref 29–143)

## 2022-01-03 LAB — RHEUMATOID FACTOR: Rheumatoid fact SerPl-aCnc: <14 IU/mL (ref <14)

## 2022-01-03 LAB — SJOGRENS SYNDROME-A EXTRACTABLE NUCLEAR ANTIBODY: SSA (Ro) (ENA) Antibody, IgG: <1 AI

## 2022-01-03 LAB — MICROSCOPIC MESSAGE

## 2022-01-03 LAB — C3 AND C4
C3 Complement: 175 mg/dL (ref 83–193)
C4 Complement: 30 mg/dL (ref 15–57)

## 2022-01-03 LAB — RNP ANTIBODY: Ribonucleic Protein(ENA) Antibody, IgG: <1 AI

## 2022-01-03 NOTE — Progress Notes (Signed)
Ro and La negative.

## 2022-01-04 NOTE — Progress Notes (Signed)
RNP negative.  SPEP did not reveal any abnormal protein bands.

## 2022-01-08 ENCOUNTER — Encounter: Payer: Self-pay | Admitting: Family Medicine

## 2022-01-11 DIAGNOSIS — N950 Postmenopausal bleeding: Secondary | ICD-10-CM | POA: Diagnosis not present

## 2022-01-11 DIAGNOSIS — N952 Postmenopausal atrophic vaginitis: Secondary | ICD-10-CM | POA: Diagnosis not present

## 2022-01-15 DIAGNOSIS — N950 Postmenopausal bleeding: Secondary | ICD-10-CM | POA: Diagnosis not present

## 2022-01-18 ENCOUNTER — Other Ambulatory Visit: Payer: Self-pay

## 2022-01-18 MED ORDER — PROGESTERONE MICRONIZED 100 MG PO CAPS: ORAL_CAPSULE | ORAL | 0 refills | Status: DC

## 2022-01-23 ENCOUNTER — Ambulatory Visit: Payer: BC Managed Care – PPO | Admitting: Pulmonary Disease

## 2022-01-23 ENCOUNTER — Encounter: Payer: Self-pay | Admitting: Pulmonary Disease

## 2022-01-23 VITALS — BP 126/68 | HR 78 | Temp 98.4°F | Ht 70.0 in | Wt 209.0 lb

## 2022-01-23 DIAGNOSIS — R0609 Other forms of dyspnea: Secondary | ICD-10-CM | POA: Diagnosis not present

## 2022-01-23 DIAGNOSIS — M3500 Sicca syndrome, unspecified: Secondary | ICD-10-CM | POA: Diagnosis not present

## 2022-01-23 MED ORDER — FLUTICASONE-SALMETEROL 500-50 MCG/ACT IN AEPB: 1.0000 | INHALATION_SPRAY | Freq: Two times a day (BID) | RESPIRATORY_TRACT | 3 refills | Status: DC

## 2022-01-23 NOTE — Progress Notes (Signed)
$'@Patient'd$  ID: [name], female    DOB: [birth year], 61 y.o.   MRN: [id number]  Chief Complaint  Patient presents with   Consult    Pt here for consult for sjogren syndrome. Pt states that she has SOB(started 2 years ago after covid),coughing, chest pain behind the sternum that started in January. Pt is on Albuterol as needed. Pt had chest xray, EKG and labs done at the start of the month. Pt states her breathing has went down hill a lot.     Referring provider: Su Monks  HPI:   61 y.o. woman whom we are seeing in consultation for evaluation of dyspnea on exertion, chronic cough, chest discomfort.  Most recent note from PCP reviewed.  Most recent rheumatology note reviewed.  She has in 2015 she had similar episode of dyspnea on exertion.  Notable on going up and down hills.  In [place] area.  This lasted for a few months but seem to resolve on its own.  Do not recall any intervention that make things better or worse.  She was in usual state of health until the late 2020.  Got COVID.  Since then has had a chronic cough.  The episodes or spasms of cough have gradually decreased in terms of frequency and severity.  However she is A chronic cough ever since then.  Over last 2 years she is developed dyspnea on exertion.  Gradually worsening.  Had been work in the yard for hours at a time without issue.  Now, do about 15 minutes before stopping and resting.  In addition she has some chest discomfort.  Substernal.  Side of her chest.  Not worse with exertion.  Occurs at rest.  Comes and goes.  She endorses longstanding history of reflux.  Heartburn is improved but she continues to have reflux regurgitation symptoms quite frequently.  Associated dry raspy voice.  Cough continues despite PPI therapy.  In addition, she has postnasal drip.  Seasonal allergies.  Does not often have rhinorrhea more dripped on the back of her throat.  Cough is productive of sputum.  She denies any  history of asthma.    She had recent chest x-ray 12/2021 and on my review interpretation is clear.  Borderline hyperinflated but cannot quite say so.  She has a history of eosinophilic esophagitis for which she follows with GI in addition to the reflux issues.  Recently diagnosed with Sjogren's within the last year or so.  PMH: Prediabetes, seasonal allergies, neuropathy, Hashimoto's thyroiditis, Sjogren's syndrome Surgical history: Breast augmentation Family history: First-degree relatives with emphysema, asthma Social history: Never smoker, lives in [place], self-employed   Licensed conveyancer / Pulmonary Flowsheets:   ACT:      View : No data to display.          MMRC:     View : No data to display.          Epworth:      View : No data to display.          Tests:   FENO:  No results found for: NITRICOXIDE  PFT:     View : No data to display.          WALK:      View : No data to display.          Imaging: Personally reviewed and as per EMR discussion in this note DG Chest 2 View  Result Date: 12/24/2021 CLINICAL DATA:  Chest pain, shortness of breath EXAM: CHEST - 2 VIEW COMPARISON:  09/27/2020 FINDINGS: The heart size and mediastinal contours are within normal limits. Both lungs are clear. The visualized skeletal structures are unremarkable. IMPRESSION: No active cardiopulmonary disease. Electronically Signed   By: Davina Poke D.O.   On: 12/24/2021 12:50    Lab Results: Personally reviewed, notably eosinophils greater than 300 CBC    Component Value Date/Time   WBC 6.2 01/01/2022 0955   RBC 4.60 01/01/2022 0955   HGB 13.9 01/01/2022 0955   HCT 42.3 01/01/2022 0955   PLT 291 01/01/2022 0955   MCV 92.0 01/01/2022 0955   MCH 30.2 01/01/2022 0955   MCHC 32.9 01/01/2022 0955   RDW 13.3 01/01/2022 0955   LYMPHSABS 1,978 01/01/2022 0955   MONOABS 0.5 06/14/2019 1022   EOSABS 366 01/01/2022 0955   BASOSABS 93 01/01/2022 0955     BMET    Component Value Date/Time   NA 136 01/01/2022 0955   K 4.6 01/01/2022 0955   CL 103 01/01/2022 0955   CO2 22 01/01/2022 0955   GLUCOSE 83 01/01/2022 0955   BUN 23 01/01/2022 0955   CREATININE 0.82 01/01/2022 0955   CALCIUM 9.8 01/01/2022 0955   GFRNONAA 64 11/08/2020 1215   GFRAA 74 11/08/2020 1215    BNP No results found for: BNP  ProBNP No results found for: PROBNP  Specialty Problems       Pulmonary Problems   Bronchitis with acute wheezing   Chronic cough   Wheezing   Dyspnea on exertion    No Known Allergies  Immunization History  Administered Date(s) Administered   Influenza,inj,Quad PF,6+ Mos 08/10/2014, 08/14/2015, 05/08/2016, 06/23/2017, 09/14/2018, 06/01/2019, 06/04/2021   Tdap 05/01/2016    Past Medical History:  Diagnosis Date   Allergic rhinitis    Anxiety    Cervical dysplasia 1990   s/p conization   Chronic sinusitis    Depression    Fibromyalgia    Hiatal hernia    Hip fracture, left ([place]) 2005   s/p ORIF   Hypothyroidism    Migraine    Neuropathy    Schatzki's ring     Tobacco History: Social History   Tobacco Use  Smoking Status Former   Packs/day: 0.50   Types: Cigarettes   Quit date: 1983   Years since quitting: 40.4   Passive exposure: Never  Smokeless Tobacco Never  Tobacco Comments   Pt states she smoked in high school and college. 01/23/22 ALS    Counseling given: Not Answered Tobacco comments: Pt states she smoked in high school and college. 01/23/22 ALS    Continue to not smoke  Outpatient Encounter Medications as of 01/23/2022  Medication Sig   albuterol (VENTOLIN HFA) 108 (90 Base) MCG/ACT inhaler Inhale 2 puffs into the lungs every 4 (four) hours as needed for wheezing or shortness of breath.   ARMOUR THYROID 60 MG tablet TAKE 1 TABLET BY MOUTH DAILY BEFORE BREAKFAST   cetirizine (ZYRTEC) 10 MG tablet Take 10 mg by mouth at bedtime.   Cholecalciferol (VITAMIN D PO) Take by mouth daily.    DULoxetine (CYMBALTA) 60 MG capsule Take 1 capsule (60 mg total) by mouth daily.   fluticasone-salmeterol (ADVAIR DISKUS) 500-50 MCG/ACT AEPB Inhale 1 puff into the lungs in the morning and at bedtime.   GAMMA AMINOBUTYRIC ACID PO Take 750 mg by mouth daily.   hydrOXYzine (ATARAX/VISTARIL) 50 MG tablet Take 1 tablet (50 mg total) by mouth every 6 (six) hours as  needed.   Loteprednol Etabonate (EYSUVIS OP) Apply to eye as needed.   Magnesium Citrate 100 MG TABS Take 200 mg by mouth daily.   meclizine (ANTIVERT) 25 MG tablet Take 1 tablet (25 mg total) by mouth 3 (three) times daily as needed for dizziness.   Melatonin 10 MG TABS Take 10 mg by mouth at bedtime.   metFORMIN (GLUCOPHAGE) 500 MG tablet TAKE 1 TABLET BY MOUTH TWICE DAILY WITH A MEAL   methocarbamol (ROBAXIN) 500 MG tablet Take 1 tablet (500 mg total) by mouth 2 (two) times daily as needed for muscle spasms.   nystatin ointment (MYCOSTATIN) Apply 1 application topically 2 (two) times daily. (Patient taking differently: Apply 1 application. topically as needed.)   Omega-3 Fatty Acids (FISH OIL PO) Take by mouth daily.   OVER THE COUNTER MEDICATION Seleium 200 mg   pantoprazole (PROTONIX) 40 MG tablet Take 1 tablet (40 mg total) by mouth daily.   SUMAtriptan (IMITREX) 50 MG tablet Take 1 tablet (50 mg total) by mouth every 2 (two) hours as needed for migraine. May repeat in 2 hours if headache persists or recurs.   triamcinolone (KENALOG) 0.1 % Apply 1 application topically 2 (two) times daily. (Patient taking differently: Apply 1 application. topically as needed.)   TURMERIC CURCUMIN PO Take 1 tablet by mouth daily.   vitamin E 400 UNIT capsule Take 400 Units by mouth daily.   [DISCONTINUED] DOTTI 0.075 MG/24HR PLACE 1 PATCH ONTO THE SKIN TWICE WEEKLY   [DISCONTINUED] progesterone (PROMETRIUM) 100 MG capsule TAKE 1 CAPSULE BY MOUTH EACH NIGHT AT BEDTIME. Pt needs appt with PCP for further refills.   No facility-administered encounter  medications on file as of 01/23/2022.     Review of Systems  Review of Systems  No consistent chest pain with exertion.  No orthopnea or PND.  No lower extremity swelling.  Comprehensive review of systems otherwise negative. Physical Exam  BP 126/68 (BP Location: Left Arm, Patient Position: Sitting, Cuff Size: Normal)   Pulse 78   Temp 98.4 F (36.9 C) (Oral)   Ht '5\' 10"'$  (1.778 m)   Wt 209 lb (94.8 kg)   LMP 07/05/2019   SpO2 98%   BMI 29.99 kg/m   Wt Readings from Last 5 Encounters:  01/23/22 209 lb (94.8 kg)  01/01/22 210 lb (95.3 kg)  12/24/21 209 lb 6.4 oz (95 kg)  10/08/21 204 lb (92.5 kg)  06/04/21 207 lb (93.9 kg)    BMI Readings from Last 5 Encounters:  01/23/22 29.99 kg/m  01/01/22 30.13 kg/m  12/24/21 30.05 kg/m  10/08/21 29.48 kg/m  06/04/21 29.91 kg/m     Physical Exam General: Well-appearing, sitting in chair Eyes: EOMI, icterus Neck: Supple: No JVP Pulmonary: Clear, no crackles, no work of breathing Cardiovascular: Regular rhythm, no murmur Abdomen: Not think about hospice MSK: No synovitis, joint effusion Neuro: Normal gait, no weakness Psych: Normal mood, full affect   Assessment & Plan:   Dyspnea on exertion: Suspect multifactorial.  Worsened after COVID infection.  Suspicious for reactive airways disease/asthma.  Trial high-dose Advair discus and assess response.  Other contributions are likely weight given increase in weight of 30 pounds over the last couple years.  Also, concern for deconditioning given decrease in activity over time.  PFTs for further evaluation.  Given underlying Sjogren's, ILD is a possibility.  Her chest x-ray and exam are not as concerning but reasonable to obtain cross-sectional imaging for screening purposes.  High-res CT scan ordered.  Consider  cardiac work-up if evaluation above is normal.  Chronic cough: Concern for asthma as above given worsening after COVID infection.  She has ongoing GERD symptoms despite  taking medications.  High suspicion for reflux driven cough as well.  Also, she endorses postnasal drip symptoms, seasonal allergies which can contribute.  We will start with Advair as above.  She has upcoming GI evaluation.  Consider more aggressive treatment for postnasal drip in the future if cough not improving with asthma therapies.  Chest discomfort: Substernal.  Not reliably reproduced with exertion.  Suspect related to reflux versus possible discomfort in the setting of asthma.  Has upcoming GI follow-up.  Assess response to Advair.   Return in about 6 weeks (around 03/06/2022).   Lanier Clam, MD 01/23/2022

## 2022-01-23 NOTE — Patient Instructions (Signed)
Nice to meet you  I think your symptoms could be multifactorial and have contribution from multiple things.  Given worsening after COVID infection, I worry about inflammation in the lungs, something similar to asthma which can cause cough, shortness of breath, some chest discomfort.  In effort to treat this, I recommend Advair 1 puff twice a day.  Rinse your mouth out with water after every use.  If the cost is too high, please contact me and we will look for a more cost effective solution.  To further evaluate your symptoms particular shortness of breath, I recommend a pulmonary function test.  We will try to get this scheduled for you today before you leave.  In addition for further evaluation, I recommend a high-resolution CT scan of your chest or lungs.  I have ordered this to be done at [facility].  Someone should call you to get this scheduled in the coming days to weeks.  Return to clinic in 6 weeks or sooner as needed with Dr. Silas Flood

## 2022-01-24 ENCOUNTER — Encounter: Payer: Self-pay | Admitting: Physician Assistant

## 2022-01-24 ENCOUNTER — Ambulatory Visit: Payer: BC Managed Care – PPO | Admitting: Physician Assistant

## 2022-01-24 VITALS — BP 118/74 | HR 79 | Ht 70.0 in | Wt 211.0 lb

## 2022-01-24 DIAGNOSIS — K219 Gastro-esophageal reflux disease without esophagitis: Secondary | ICD-10-CM

## 2022-01-24 DIAGNOSIS — K200 Eosinophilic esophagitis: Secondary | ICD-10-CM | POA: Diagnosis not present

## 2022-01-24 MED ORDER — PANTOPRAZOLE SODIUM 40 MG PO TBEC: 40.0000 mg | DELAYED_RELEASE_TABLET | Freq: Two times a day (BID) | ORAL | 1 refills | Status: DC

## 2022-01-24 NOTE — Patient Instructions (Signed)
We have sent the following medications to your pharmacy for you to pick up at your convenience: Increase Pantoprazole 40 mg twice daily 30-60 minutes before breakfast and dinner.  You have been scheduled for an endoscopy. Please follow written instructions given to you at your visit today. If you use inhalers (even only as needed), please bring them with you on the day of your procedure.  If you are age 59 or older, your body mass index should be between 23-30. Your Body mass index is 30.28 kg/m. If this is out of the aforementioned range listed, please consider follow up with your Primary Care Provider.  If you are age 40 or younger, your body mass index should be between 19-25. Your Body mass index is 30.28 kg/m. If this is out of the aformentioned range listed, please consider follow up with your Primary Care Provider.   ________________________________________________________  The [facility] providers would like to encourage you to use [facility] to communicate with providers for non-urgent requests or questions.  Due to long hold times on the telephone, sending your provider a message by [facility] may be a faster and more efficient way to get a response.  Please allow 48 business hours for a response.  Please remember that this is for non-urgent requests.  _______________________________________________________

## 2022-01-24 NOTE — Progress Notes (Signed)
Chief Complaint: GERD  HPI:    [name] is a 61 year old female with a past medical history as listed below including fibromyalgia, Hashimoto's thyroiditis and now hypothyroidism, depression, anxiety and multiple others, known to Dr. Hilarie Fredrickson, who was referred to me by Lesleigh Noe, MD for a complaint of GERD.    06/2013 colonoscopy with a lymphoid polyp removed from the transverse colon.    02/09/2020 office visit with Dr. Hilarie Fredrickson to discuss abdominal bloating, constipation and GERD.  At that time scheduled for an EGD and also continued on Omeprazole 20 mg a day.  Added Benefiber and MiraLAX.  Repeat colonoscopy recommended in November 2024.    02/29/2020 EGD with normal mucosa in the entire esophagus biopsied after dilation and a nonobstructing Schatzki's ring as well as a 1 cm hiatal hernia.  Biopsies showed suspicion of eosinophilic esophagitis.  Her Omeprazole was increased to 40 mg daily.  Discussed checking in in a few months and if esophageal and GERD symptoms do not improve then would recommend topical fluticasone therapy for EOE and also referral to allergy and asthma.    04/27/2020 insurance had approved Pantoprazole for the patient.  It did not cover Flovent so she was given Budesonide swallowed 1 teaspoon (5 mL) twice daily for 8 weeks    10/15/2020 patient messaged about using Pulmicort Respules and mixing them.  She was sent in a prescription for this.    01/23/2022 patient saw pulmonology and discussed a chronic cough.  There was high suspicion that this was reflux driven.  She was started on Advair.    Today, the patient explains that she has been through a lot over the past couple of years.  Tells me that she was taking the Budesonide slurry for about a year but she did not feel like it was really doing much so she stopped it and after a year and a half her symptoms of morning cough went away from her EOE.  Tells me that in January she developed a pain right behind her sternum that was  definitely worse if she coughed or sneezed but also could feel it if she slept on her side like "something is in there".  This is associated with a lot of throat clearing and regurgitation burps.  She recently went to see a pulmonologist regarding some shortness of breath she is experiencing and was told that reflux could be playing a role.  Has been started on a steroid by them.  She is still taking Pantoprazole 40 mg daily but not sure if it is helping much.    Describes that she had COVID in November 2020 and feels like everything seemed to start after that, also moved to a 10 acre property and has been clearing trees and land and has always had trouble with allergies so feels like this is playing a role as well.    Denies fever, chills, weight loss, change in bowel habit or symptoms that awaken her from sleep.  Past Medical History:  Diagnosis Date   Allergic rhinitis    Anxiety    Cervical dysplasia 1990   s/p conization   Chronic sinusitis    Depression    Fibromyalgia    Hiatal hernia    Hip fracture, left ([place]) 2005   s/p ORIF   Hypothyroidism    Migraine    Neuropathy    Schatzki's ring     Past Surgical History:  Procedure Laterality Date   BREAST ENHANCEMENT SURGERY  2007  BREAST IMPLANT REMOVAL Bilateral 05/21/2018   DILATION AND CURETTAGE OF UTERUS  1989   ORIF HIP FRACTURE  2005   [place]   TONSILLECTOMY AND ADENOIDECTOMY     TOTAL HIP ARTHROPLASTY Left 2005   second surgery     Current Outpatient Medications  Medication Sig Dispense Refill   albuterol (VENTOLIN HFA) 108 (90 Base) MCG/ACT inhaler Inhale 2 puffs into the lungs every 4 (four) hours as needed for wheezing or shortness of breath. 18 g 0   ARMOUR THYROID 60 MG tablet TAKE 1 TABLET BY MOUTH DAILY BEFORE BREAKFAST 90 tablet 3   cetirizine (ZYRTEC) 10 MG tablet Take 10 mg by mouth at bedtime.     Cholecalciferol (VITAMIN D PO) Take by mouth daily.     DULoxetine (CYMBALTA) 60 MG capsule Take 1 capsule  (60 mg total) by mouth daily. 90 capsule 3   fluticasone-salmeterol (ADVAIR DISKUS) 500-50 MCG/ACT AEPB Inhale 1 puff into the lungs in the morning and at bedtime. 60 each 3   GAMMA AMINOBUTYRIC ACID PO Take 750 mg by mouth daily.     hydrOXYzine (ATARAX/VISTARIL) 50 MG tablet Take 1 tablet (50 mg total) by mouth every 6 (six) hours as needed. 30 tablet 0   Loteprednol Etabonate (EYSUVIS OP) Apply to eye as needed.     Magnesium Citrate 100 MG TABS Take 200 mg by mouth daily.     meclizine (ANTIVERT) 25 MG tablet Take 1 tablet (25 mg total) by mouth 3 (three) times daily as needed for dizziness. 30 tablet 0   Melatonin 10 MG TABS Take 10 mg by mouth at bedtime.     metFORMIN (GLUCOPHAGE) 500 MG tablet TAKE 1 TABLET BY MOUTH TWICE DAILY WITH A MEAL 180 tablet 1   methocarbamol (ROBAXIN) 500 MG tablet Take 1 tablet (500 mg total) by mouth 2 (two) times daily as needed for muscle spasms. 60 tablet 1   nystatin ointment (MYCOSTATIN) Apply 1 application topically 2 (two) times daily. (Patient taking differently: Apply 1 application. topically as needed.) 30 g 0   Omega-3 Fatty Acids (FISH OIL PO) Take by mouth daily.     OVER THE COUNTER MEDICATION Seleium 200 mg     pantoprazole (PROTONIX) 40 MG tablet Take 1 tablet (40 mg total) by mouth daily. 90 tablet 3   SUMAtriptan (IMITREX) 50 MG tablet Take 1 tablet (50 mg total) by mouth every 2 (two) hours as needed for migraine. May repeat in 2 hours if headache persists or recurs. 10 tablet 5   triamcinolone (KENALOG) 0.1 % Apply 1 application topically 2 (two) times daily. (Patient taking differently: Apply 1 application. topically as needed.) 30 g 0   TURMERIC CURCUMIN PO Take 1 tablet by mouth daily.     vitamin E 400 UNIT capsule Take 400 Units by mouth daily.     No current facility-administered medications for this visit.    Allergies as of 01/24/2022   (No Known Allergies)    Family History  Problem Relation Age of Onset    Arthritis/Rheumatoid Mother        RA and OA   Asthma Mother    Breast cancer Maternal Aunt 47   Irregular heart beat Father        need ablation   Basal cell carcinoma Father    Asthma Father        exercise induced asthma    Arthritis Maternal Grandmother    Heart disease Maternal Grandmother    Lung cancer  Paternal Grandfather    Drug abuse Brother    Alcohol abuse Brother    Appendicitis Son    Allergies Son    Migraines Son    Eczema Son    Healthy Son    Allergies Daughter    Healthy Daughter    Colon cancer Neg Hx    Pancreatic cancer Neg Hx    Stomach cancer Neg Hx    Esophageal cancer Neg Hx    Rectal cancer Neg Hx     Social History   Socioeconomic History   Marital status: Married    Spouse name: Timmothy Sours   Number of children: 2   Years of education: college   Highest education level: Not on file  Occupational History   Not on file  Tobacco Use   Smoking status: Former    Packs/day: 0.50    Types: Cigarettes    Quit date: 1983    Years since quitting: 40.4    Passive exposure: Never   Smokeless tobacco: Never   Tobacco comments:    Pt states she smoked in high school and college. 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 - grown adults - daughter in [place]      Enjoys: pets, photography, gardening, exercise, cooking, crafts, church activities      Exercise: cardio 3 days a week, weight lifting 2 times a week   Diet: not great, has reduced sugar      Safety   Seat belts: Yes    Guns: Yes  and secure   Safe in relationships: Yes    Social  Determinants of Radio broadcast assistant Strain: Not on file  Food Insecurity: Not on file  Transportation Needs: Not on file  Physical Activity: Not on file  Stress: Not on file  Social Connections: Not on file  Intimate Partner Violence: Not on file    Review of Systems:    Constitutional: No weight loss, fever or chills Skin: No rash Cardiovascular: No chest pain  Respiratory:+DOE Gastrointestinal: See HPI and otherwise negative   Physical Exam:  Vital signs: BP 118/74   Pulse 79   Ht '5\' 10"'$  (1.778 m)   Wt 211 lb (95.7 kg)   LMP 07/05/2019   SpO2 98%   BMI 30.28 kg/m    Constitutional:   Pleasant Caucasian female appears to be in NAD, Well developed, Well nourished, alert and cooperative Respiratory: Respirations even and unlabored. Lungs clear to auscultation bilaterally.   No wheezes, crackles, or rhonchi.  Cardiovascular: Normal S1, S2. No MRG. Regular rate and rhythm. No peripheral edema, cyanosis or pallor.  Gastrointestinal:  Soft, nondistended, nontender. No rebound or guarding. Normal bowel sounds. No appreciable masses or hepatomegaly. Rectal:  Not performed.  Psychiatric: Oriented to person, place and time. Demonstrates good judgement and reason without abnormal affect or behaviors.  RELEVANT LABS AND IMAGING: CBC    Component Value Date/Time   WBC 6.2 01/01/2022 0955   RBC 4.60 01/01/2022 0955  HGB 13.9 01/01/2022 0955   HCT 42.3 01/01/2022 0955   PLT 291 01/01/2022 0955   MCV 92.0 01/01/2022 0955   MCH 30.2 01/01/2022 0955   MCHC 32.9 01/01/2022 0955   RDW 13.3 01/01/2022 0955   LYMPHSABS 1,978 01/01/2022 0955   MONOABS 0.5 06/14/2019 1022   EOSABS 366 01/01/2022 0955   BASOSABS 93 01/01/2022 0955    CMP     Component Value Date/Time   NA 136 01/01/2022 0955   K 4.6 01/01/2022 0955   CL 103 01/01/2022 0955   CO2 22 01/01/2022 0955   GLUCOSE 83 01/01/2022 0955   BUN 23 01/01/2022 0955   CREATININE 0.82 01/01/2022 0955   CALCIUM 9.8  01/01/2022 0955   PROT 8.0 01/01/2022 0955   PROT 7.6 01/01/2022 0955   ALBUMIN 4.1 06/04/2021 1129   AST 16 01/01/2022 0955   ALT 13 01/01/2022 0955   ALKPHOS 74 06/04/2021 1129   BILITOT 0.4 01/01/2022 0955   GFRNONAA 64 11/08/2020 1215   GFRAA 74 11/08/2020 1215    Assessment: 1.  GERD and EOE: Regardless of Pantoprazole 40 mg daily, no longer taking Budesonide; consider exacerbation of EOE +/- new symptoms of esophagitis etc. 2.  Chronic cough and atypical chest pain: Consider relation to above  Plan: 1.  Scheduled patient for repeat diagnostic EGD with Dr. Hilarie Fredrickson in the [facility].  Did provide the patient a detailed list of risks for the procedure and she agrees to proceed. Patient is appropriate for endoscopic procedure(s) in the ambulatory ([facility]) setting.  2.  Increase Pantoprazole to 40 mg twice daily, 20-30 minutes before breakfast and dinner.  #60.  Patient will let us know how this working for her over the next month.  If this is making things better then can give her 90-day prescription going forward.  If not would recommend a change to possibly Nexium, Aciphex or Dexilant. 3.  Patient to follow in clinic per recommendations after time of EGD.  Ellouise Newer, PA-C [facility] 01/24/2022, 8:56 AM  Cc: Lesleigh Noe, MD

## 2022-01-27 ENCOUNTER — Encounter: Payer: Self-pay | Admitting: Certified Registered Nurse Anesthetist

## 2022-01-27 NOTE — Progress Notes (Signed)
Addendum: Reviewed and agree with assessment and management plan. Astoria Condon M, MD  

## 2022-01-30 NOTE — Progress Notes (Deleted)
Office Visit Note  Patient: [name]             Date of Birth: [birth year]           MRN: [id number]             PCP: Lesleigh Noe, MD Referring: Lesleigh Noe, MD Visit Date: 02/13/2022 Occupation: '@GUAROCC'$ @  Subjective:  No chief complaint on file.   History of Present Illness: [name] is a 61 y.o. female ***   Activities of Daily Living:  Patient reports morning stiffness for *** {minute/hour:19697}.   Patient {ACTIONS;DENIES/REPORTS:21021675::"Denies"} nocturnal pain.  Difficulty dressing/grooming: {ACTIONS;DENIES/REPORTS:21021675::"Denies"} Difficulty climbing stairs: {ACTIONS;DENIES/REPORTS:21021675::"Denies"} Difficulty getting out of chair: {ACTIONS;DENIES/REPORTS:21021675::"Denies"} Difficulty using hands for taps, buttons, cutlery, and/or writing: {ACTIONS;DENIES/REPORTS:21021675::"Denies"}  No Rheumatology ROS completed.   PMFS History:  Patient Active Problem List   Diagnosis Date Noted   Chest pain 12/24/2021   Vaginal yeast infection 06/04/2021   Overweight (BMI 25.0-29.9) 06/04/2021   Sjogren's disease ([place]) 01/11/2021   Excessive sweating, local 01/11/2021   Urethra disorder 01/11/2021   Dyspnea on exertion 01/11/2021   Wheezing 09/27/2020   Malar rash 09/27/2020   Dry mouth 09/27/2020   Pernio, initial encounter 83/15/1761   Eosinophilic esophagitis 60/73/7106   Menopause 11/29/2019   Prediabetes 10/25/2019   Chronic cough 10/18/2019   Grief 09/20/2019   Anxiety 09/08/2019   Fibromyalgia 09/08/2019   Bronchitis with acute wheezing 07/12/2019   Peripheral neuropathy 06/14/2019   BPV (benign positional vertigo), right 06/14/2019   Dust exposure 10/21/2018   Vitamin D deficiency 09/14/2018   Loose stools 09/14/2018   Fatigue 10/01/2017   Generalized anxiety disorder 11/21/2015   Polyarthralgia 08/14/2015   Vitamin B12 deficiency 08/10/2014   Insomnia 06/30/2012   Migraine with status migrainosus 05/23/2011    Depression    Hypothyroidism due to Hashimoto's thyroiditis     Past Medical History:  Diagnosis Date   Allergic rhinitis    Anxiety    Cervical dysplasia 1990   s/p conization   Chronic sinusitis    Depression    Fibromyalgia    Hiatal hernia    Hip fracture, left ([place]) 2005   s/p ORIF   Hypothyroidism    Migraine    Neuropathy    Schatzki's ring     Family History  Problem Relation Age of Onset   Arthritis/Rheumatoid Mother        RA and OA   Asthma Mother    Breast cancer Maternal Aunt 48   Irregular heart beat Father        need ablation   Basal cell carcinoma Father    Asthma Father        exercise induced asthma    Arthritis Maternal Grandmother    Heart disease Maternal Grandmother    Lung cancer Paternal Grandfather    Drug abuse Brother    Alcohol abuse Brother    Appendicitis Son    Allergies Son    Migraines Son    Eczema Son    Healthy Son    Allergies Daughter    Healthy Daughter    Colon cancer Neg Hx    Pancreatic cancer Neg Hx    Stomach cancer Neg Hx    Esophageal cancer Neg Hx    Rectal cancer Neg Hx    Past Surgical History:  Procedure Laterality Date   BREAST ENHANCEMENT SURGERY  2007   BREAST IMPLANT REMOVAL Bilateral 05/21/2018   DILATION AND CURETTAGE OF UTERUS  1989   ORIF HIP FRACTURE  2005   [facility]   TONSILLECTOMY AND ADENOIDECTOMY     TOTAL HIP ARTHROPLASTY Left 2005   second surgery    Social History   Social History Narrative   11/29/19   From: raised in [place], but in the area for 25 years   Living: with husband, Timmothy Sours (1988)   Work: retired - considering returning to work      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 - grown adults - daughter in [place]      Enjoys: pets, photography, gardening, exercise, cooking, crafts, church activities      Exercise: cardio 3 days a week, weight lifting 2 times a week   Diet: not great, has reduced sugar      Safety   Seat belts: Yes    Guns: Yes  and secure   Safe in relationships:  Yes    Immunization History  Administered Date(s) Administered   Influenza,inj,Quad PF,6+ Mos 08/10/2014, 08/14/2015, 05/08/2016, 06/23/2017, 09/14/2018, 06/01/2019, 06/04/2021   Tdap 05/01/2016     Objective: Vital Signs: LMP 07/05/2019    Physical Exam   Musculoskeletal Exam: ***  CDAI Exam: CDAI Score: -- Patient Global: --; Provider Global: -- Swollen: --; Tender: -- Joint Exam 02/13/2022   No joint exam has been documented for this visit   There is currently no information documented on the homunculus. Go to the Rheumatology activity and complete the homunculus joint exam.  Investigation: No additional findings.  Imaging: No results found.  Recent Labs: Lab Results  Component Value Date   WBC 6.2 01/01/2022   HGB 13.9 01/01/2022   PLT 291 01/01/2022   NA 136 01/01/2022   K 4.6 01/01/2022   CL 103 01/01/2022   CO2 22 01/01/2022   GLUCOSE 83 01/01/2022   BUN 23 01/01/2022   CREATININE 0.82 01/01/2022   BILITOT 0.4 01/01/2022   ALKPHOS 74 06/04/2021   AST 16 01/01/2022   ALT 13 01/01/2022   PROT 8.0 01/01/2022   PROT 7.6 01/01/2022   ALBUMIN 4.1 06/04/2021   CALCIUM 9.8 01/01/2022   GFRAA 74 11/08/2020    Speciality Comments: No specialty comments available.  Procedures:  No procedures performed Allergies: Patient has no known allergies.   Assessment / Plan:     Visit Diagnoses: Sjogren's syndrome with other organ involvement (HCC)  Breathlessness  Sternal pain  Primary osteoarthritis of both hands  History of hip replacement, total, left  Chondromalacia of patella, unspecified laterality  Other fatigue  Fibromyalgia  Muscular deconditioning  Muscle weakness  Eosinophilic esophagitis  Other polyneuropathy  Excessive sweating  Vitamin B12 deficiency  Vitamin D deficiency  Hypothyroidism due to Hashimoto's thyroiditis  BPV (benign positional vertigo), right  Family history of rheumatoid arthritis  Orders: No orders of  the defined types were placed in this encounter.  No orders of the defined types were placed in this encounter.   Face-to-face time spent with patient was *** minutes. Greater than 50% of time was spent in counseling and coordination of care.  Follow-Up Instructions: No follow-ups on file.   Ofilia Neas, PA-C  Note - This record has been created using Dragon software.  Chart creation errors have been sought, but may not always  have been located. Such creation errors do not reflect on  the standard of medical care.

## 2022-02-01 ENCOUNTER — Encounter: Payer: Self-pay | Admitting: Internal Medicine

## 2022-02-01 ENCOUNTER — Ambulatory Visit (AMBULATORY_SURGERY_CENTER): Payer: BC Managed Care – PPO | Admitting: Internal Medicine

## 2022-02-01 VITALS — BP 118/70 | HR 62 | Temp 98.2°F | Resp 10 | Ht 70.0 in | Wt 211.0 lb

## 2022-02-01 DIAGNOSIS — K200 Eosinophilic esophagitis: Secondary | ICD-10-CM | POA: Diagnosis not present

## 2022-02-01 DIAGNOSIS — R053 Chronic cough: Secondary | ICD-10-CM | POA: Diagnosis not present

## 2022-02-01 DIAGNOSIS — K219 Gastro-esophageal reflux disease without esophagitis: Secondary | ICD-10-CM

## 2022-02-01 DIAGNOSIS — R0789 Other chest pain: Secondary | ICD-10-CM

## 2022-02-01 MED ORDER — SODIUM CHLORIDE 0.9 % IV SOLN: 500.0000 mL | Freq: Once | INTRAVENOUS | Status: DC

## 2022-02-01 NOTE — Progress Notes (Signed)
Report given to PACU, vss 

## 2022-02-01 NOTE — Op Note (Signed)
[facility] Patient Name: [name] Procedure Date: 02/01/2022 10:52 AM MRN: [id number] Endoscopist: Jerene Bears , MD Age: 61 Referring MD:  Date of Birth: February 09, 1961 Gender: Female Account #: [id number] Procedure:                Upper GI endoscopy Indications:              Gastro-esophageal reflux disease, Follow-up of                            eosinophilic esophagitis, Unexplained chest pain                            (worse with cough/sneeze, positional), throat                            clearing, chronic AM cough has improved; last EGD                            July 2021 with 52 Fr Maloney dilation; current GERD                            therapy is pantoprazole 40 mg BID Medicines:                Monitored Anesthesia Care Procedure:                Pre-Anesthesia Assessment:                           - Prior to the procedure, a History and Physical                            was performed, and patient medications and                            allergies were reviewed. The patient's tolerance of                            previous anesthesia was also reviewed. The risks                            and benefits of the procedure and the sedation                            options and risks were discussed with the patient.                            All questions were answered, and informed consent                            was obtained. Prior Anticoagulants: The patient has                            taken no previous anticoagulant or antiplatelet  agents. ASA Grade Assessment: II - A patient with                            mild systemic disease. After reviewing the risks                            and benefits, the patient was deemed in                            satisfactory condition to undergo the procedure.                           After obtaining informed consent, the endoscope was                            passed under direct  vision. Throughout the                            procedure, the patient's blood pressure, pulse, and                            oxygen saturations were monitored continuously. The                            GIF [id number] #[id number] was introduced through the                            mouth, and advanced to the second part of duodenum.                            The upper GI endoscopy was accomplished without                            difficulty. The patient tolerated the procedure                            well. Scope In: Scope Out: Findings:                 Normal mucosa was found in the entire esophagus. No                            changes of reflux esophagitis or EoE today.                            Biopsies were obtained from the proximal (Jar 2)                            and distal esophagus (Jar 1) with cold forceps for                            histology of eosinophilic esophagitis.  A 1-2 cm hiatal hernia was present.                           The entire examined stomach was normal.                           The examined duodenum was normal. Complications:            No immediate complications. Estimated Blood Loss:     Estimated blood loss was minimal. Impression:               - Normal mucosa was found in the entire esophagus.                            Biopsied.                           - 1 to 2 cm hiatal hernia.                           - Normal stomach.                           - Normal examined duodenum. Recommendation:           - Patient has a contact number available for                            emergencies. The signs and symptoms of potential                            delayed complications were discussed with the                            patient. Return to normal activities tomorrow.                            Written discharge instructions were provided to the                            patient.                           - Resume  previous diet.                           - Continue present medications.                           - Await pathology results. Pathology results to                            guide any changes in therapy. Jerene Bears, MD 02/01/2022 11:22:23 AM This report has been signed electronically.

## 2022-02-01 NOTE — Progress Notes (Signed)
VS by CW  Pt's states no medical or surgical changes since previsit or office visit.  

## 2022-02-01 NOTE — Progress Notes (Signed)
See office note dated 01/24/2022 for details and recent H&P  Upper endoscopy today to follow-up GERD, history of EoE and LPR type symptoms  She remains appropriate for EGD in the [facility] today.

## 2022-02-01 NOTE — Progress Notes (Signed)
Called to room to assist during endoscopic procedure.  Patient ID and intended procedure confirmed with present staff. Received instructions for my participation in the procedure from the performing physician.  

## 2022-02-01 NOTE — Patient Instructions (Signed)
Handout given for hiatal hernia.  YOU HAD AN ENDOSCOPIC PROCEDURE TODAY AT [facility]:   Refer to the procedure report that was given to you for any specific questions about what was found during the examination.  If the procedure report does not answer your questions, please call your gastroenterologist to clarify.  If you requested that your care partner not be given the details of your procedure findings, then the procedure report has been included in a sealed envelope for you to review at your convenience later.  YOU SHOULD EXPECT: Some feelings of bloating in the abdomen. Passage of more gas than usual.  Walking can help get rid of the air that was put into your GI tract during the procedure and reduce the bloating. If you had a lower endoscopy (such as a colonoscopy or flexible sigmoidoscopy) you may notice spotting of blood in your stool or on the toilet paper. If you underwent a bowel prep for your procedure, you may not have a normal bowel movement for a few days.  Please Note:  You might notice some irritation and congestion in your nose or some drainage.  This is from the oxygen used during your procedure.  There is no need for concern and it should clear up in a day or so.  SYMPTOMS TO REPORT IMMEDIATELY:  Following upper endoscopy (EGD)  Vomiting of blood or coffee ground material  New chest pain or pain under the shoulder blades  Painful or persistently difficult swallowing  New shortness of breath  Fever of 100F or higher  Black, tarry-looking stools  For urgent or emergent issues, a gastroenterologist can be reached at any hour by calling [phone]. Do not use MyChart messaging for urgent concerns.    DIET:  We do recommend a small meal at first, but then you may proceed to your regular diet.  Drink plenty of fluids but you should avoid alcoholic beverages for 24 hours.  ACTIVITY:  You should plan to take it easy for the rest of today and you should NOT  DRIVE or use heavy machinery until tomorrow (because of the sedation medicines used during the test).    FOLLOW UP: Our staff will call the number listed on your records 24-72 hours following your procedure to check on you and address any questions or concerns that you may have regarding the information given to you following your procedure. If we do not reach you, we will leave a message.  We will attempt to reach you two times.  During this call, we will ask if you have developed any symptoms of COVID 19. If you develop any symptoms (ie: fever, flu-like symptoms, shortness of breath, cough etc.) before then, please call [phone].  If you test positive for Covid 19 in the 2 weeks post procedure, please call and report this information to [place].    If any biopsies were taken you will be contacted by phone or by letter within the next 1-3 weeks.  Please call us at [phone] if you have not heard about the biopsies in 3 weeks.    SIGNATURES/CONFIDENTIALITY: You and/or your care partner have signed paperwork which will be entered into your electronic medical record.  These signatures attest to the fact that that the information above on your After Visit Summary has been reviewed and is understood.  Full responsibility of the confidentiality of this discharge information lies with you and/or your care-partner.

## 2022-02-06 ENCOUNTER — Encounter: Payer: Self-pay | Admitting: Internal Medicine

## 2022-02-13 ENCOUNTER — Ambulatory Visit: Payer: BC Managed Care – PPO | Admitting: Physician Assistant

## 2022-02-13 DIAGNOSIS — R61 Generalized hyperhidrosis: Secondary | ICD-10-CM

## 2022-02-13 DIAGNOSIS — Z96642 Presence of left artificial hip joint: Secondary | ICD-10-CM

## 2022-02-13 DIAGNOSIS — E538 Deficiency of other specified B group vitamins: Secondary | ICD-10-CM

## 2022-02-13 DIAGNOSIS — R5383 Other fatigue: Secondary | ICD-10-CM

## 2022-02-13 DIAGNOSIS — E559 Vitamin D deficiency, unspecified: Secondary | ICD-10-CM

## 2022-02-13 DIAGNOSIS — M3509 Sicca syndrome with other organ involvement: Secondary | ICD-10-CM

## 2022-02-13 DIAGNOSIS — R0681 Apnea, not elsewhere classified: Secondary | ICD-10-CM

## 2022-02-13 DIAGNOSIS — M797 Fibromyalgia: Secondary | ICD-10-CM

## 2022-02-13 DIAGNOSIS — Z8261 Family history of arthritis: Secondary | ICD-10-CM

## 2022-02-13 DIAGNOSIS — H8111 Benign paroxysmal vertigo, right ear: Secondary | ICD-10-CM

## 2022-02-13 DIAGNOSIS — M2240 Chondromalacia patellae, unspecified knee: Secondary | ICD-10-CM

## 2022-02-13 DIAGNOSIS — K200 Eosinophilic esophagitis: Secondary | ICD-10-CM

## 2022-02-13 DIAGNOSIS — E038 Other specified hypothyroidism: Secondary | ICD-10-CM

## 2022-02-13 DIAGNOSIS — R0789 Other chest pain: Secondary | ICD-10-CM

## 2022-02-13 DIAGNOSIS — R29898 Other symptoms and signs involving the musculoskeletal system: Secondary | ICD-10-CM

## 2022-02-13 DIAGNOSIS — M19041 Primary osteoarthritis, right hand: Secondary | ICD-10-CM

## 2022-02-13 DIAGNOSIS — G6289 Other specified polyneuropathies: Secondary | ICD-10-CM

## 2022-02-13 DIAGNOSIS — M6281 Muscle weakness (generalized): Secondary | ICD-10-CM

## 2022-03-04 ENCOUNTER — Encounter: Payer: Self-pay | Admitting: Internal Medicine

## 2022-03-08 ENCOUNTER — Encounter: Payer: Self-pay | Admitting: Family Medicine

## 2022-03-08 ENCOUNTER — Ambulatory Visit: Payer: BC Managed Care – PPO | Admitting: Family Medicine

## 2022-03-08 VITALS — BP 100/70 | HR 84 | Temp 97.5°F | Ht 70.0 in | Wt 204.4 lb

## 2022-03-08 DIAGNOSIS — F32A Depression, unspecified: Secondary | ICD-10-CM | POA: Diagnosis not present

## 2022-03-08 DIAGNOSIS — M797 Fibromyalgia: Secondary | ICD-10-CM | POA: Diagnosis not present

## 2022-03-08 DIAGNOSIS — E663 Overweight: Secondary | ICD-10-CM | POA: Diagnosis not present

## 2022-03-08 DIAGNOSIS — G6289 Other specified polyneuropathies: Secondary | ICD-10-CM | POA: Diagnosis not present

## 2022-03-08 DIAGNOSIS — F324 Major depressive disorder, single episode, in partial remission: Secondary | ICD-10-CM

## 2022-03-08 MED ORDER — DULOXETINE HCL 20 MG PO CPEP: 40.0000 mg | ORAL_CAPSULE | Freq: Every day | ORAL | 0 refills | Status: DC

## 2022-03-08 NOTE — Patient Instructions (Addendum)
Stop cymbalta Week 1-2: 40 mg daily Week 3-4: 20 mg daily Week 5: stop  If issues of withdrawal try every other day with the higher/lower dose Only decrease if symptoms have improved  Update if nerve pain is severe and can start Gabapentin  Work on healthy diet and exercise

## 2022-03-08 NOTE — Assessment & Plan Note (Signed)
Initially started on Cymbalta setting of depression.  Now with a 40 pound weight gain since starting the medication.  And she notes suppression of all emotions.  Discussed that her PHQ-9 is elevated but unclear if this is secondary to general apathy from medication or a sign of more significant untreated depression.  This time due to suspected weight gain secondary to medication we will stop Cymbalta.  Taper discussed at length.  Return in about 4 weeks for check-in to see how mood is improving and to consider alternatives if depressive symptoms continue.

## 2022-03-08 NOTE — Progress Notes (Signed)
Subjective:     [name] is a 61 y.o. female presenting for Medication Problem (Side effects to cymbalta) and Obesity (50-60 lbs weight gain over last year )     HPI  #weight gain - 2020 weighed ~140 - 2021 weighed ~160 - gained about 20 lbs in a few months after starting cymbalta and weighed ~180 - taking cymbalta primarily for nerve pain - was originally on gabapentin - started cymbalta due to dad recently dying - feels the grief has resolved - also does not feel like herself - cannot cry - does feel pain is well controlled  - does not feel she has proper appetite cues  Review of Systems   Social History   Tobacco Use  Smoking Status Former   Packs/day: 0.50   Types: Cigarettes   Quit date: 1983   Years since quitting: 40.5   Passive exposure: Never  Smokeless Tobacco Never  Tobacco Comments   Pt states she smoked in high school and college. 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    Appearance: She is well-developed. She is not diaphoretic.  HENT:     Right Ear: External ear normal.     Left Ear: External ear normal.     Nose: Nose normal.  Eyes:     Conjunctiva/sclera: Conjunctivae normal.  Cardiovascular:     Rate and Rhythm: Normal rate.  Pulmonary:     Effort: Pulmonary effort is normal.  Musculoskeletal:     Cervical back: Neck supple.  Skin:    General: Skin is warm and dry.     Capillary Refill: Capillary refill takes less than 2 seconds.  Neurological:     Mental Status: She is alert.  Mental status is at baseline.  Psychiatric:        Mood and Affect: Mood normal.        Behavior: Behavior normal.         03/08/2022   11:18 AM 06/04/2021   10:51 AM 01/11/2021    3:57 PM  Depression screen PHQ 2/9  Decreased Interest 2 0 1  Down, Depressed, Hopeless 2 0 0  PHQ - 2 Score 4 0 1  Altered sleeping 2  0  Tired, decreased energy 3  2  Change in appetite 3  2  Feeling bad or failure about yourself  1  0  Trouble concentrating 1  0  Moving slowly or fidgety/restless 1  0  Suicidal thoughts 0  0  PHQ-9 Score 15  5  Difficult doing work/chores Somewhat difficult  Not difficult at all      03/08/2022   11:18 AM  GAD 7 : Generalized Anxiety Score  Nervous, Anxious, on Edge 0  Control/stop worrying 0  Worry too much - different things 0  Trouble relaxing 0  Restless 0  Easily annoyed or  irritable 0  Afraid - awful might happen 1  Total GAD 7 Score 1  Anxiety Difficulty Somewhat difficult         Assessment & Plan:   Problem List Items Addressed This Visit       Nervous and Auditory   Peripheral neuropathy   Relevant Medications   DULoxetine (CYMBALTA) 20 MG capsule     Other   Major depressive disorder in partial remission ([place]) - Primary    Initially started on Cymbalta setting of depression.  Now with a 40 pound weight gain since starting the medication.  And she notes suppression of all emotions.  Discussed that her PHQ-9 is elevated but unclear if this is secondary to general apathy from medication or a sign of more significant untreated depression.  This time due to suspected weight gain secondary to medication we will stop Cymbalta.  Taper discussed at length.  Return in about 4 weeks for check-in to see how mood is improving and to consider alternatives if depressive symptoms continue.      Relevant Medications   DULoxetine (CYMBALTA) 20 MG capsule   Fibromyalgia    Cymbalta was also started in the setting of nerve pain and neuropathy.  Due to  suspected weight gain from this medication we will stop.  We will update if nerve pain is significant and will try gabapentin at that time.  Lyrica is not weight neutral and may be associated with weight gain.  Patient notes previous issues with weight gain with amitriptyline.      Relevant Medications   DULoxetine (CYMBALTA) 20 MG capsule   Overweight (BMI 25.0-29.9)    Thyroid was previously well controlled.  Discussed at this time due to her BMI she is not a candidate for medication.  She declined a nutrition referral.  Urged lifestyle changes, and will also stop Cymbalta.        Return in about 4 weeks (around 04/05/2022) for check in on nerve pain and taper of medication .  Lesleigh Noe, MD

## 2022-03-08 NOTE — Assessment & Plan Note (Signed)
Thyroid was previously well controlled.  Discussed at this time due to her BMI she is not a candidate for medication.  She declined a nutrition referral.  Urged lifestyle changes, and will also stop Cymbalta.

## 2022-03-08 NOTE — Assessment & Plan Note (Signed)
Cymbalta was also started in the setting of nerve pain and neuropathy.  Due to suspected weight gain from this medication we will stop.  We will update if nerve pain is significant and will try gabapentin at that time.  Lyrica is not weight neutral and may be associated with weight gain.  Patient notes previous issues with weight gain with amitriptyline.

## 2022-03-14 ENCOUNTER — Ambulatory Visit (HOSPITAL_COMMUNITY)
Admission: RE | Admit: 2022-03-14 | Discharge: 2022-03-14 | Disposition: A | Discharge status: Home / Self Care | Payer: BC Managed Care – PPO | Source: Ambulatory Visit | Attending: Pulmonary Disease | Admitting: Pulmonary Disease

## 2022-03-14 DIAGNOSIS — K449 Diaphragmatic hernia without obstruction or gangrene: Secondary | ICD-10-CM | POA: Diagnosis not present

## 2022-03-14 DIAGNOSIS — R0609 Other forms of dyspnea: Secondary | ICD-10-CM | POA: Diagnosis not present

## 2022-03-14 DIAGNOSIS — J849 Interstitial pulmonary disease, unspecified: Secondary | ICD-10-CM | POA: Diagnosis not present

## 2022-03-14 DIAGNOSIS — M3500 Sicca syndrome, unspecified: Secondary | ICD-10-CM | POA: Insufficient documentation

## 2022-03-14 DIAGNOSIS — R918 Other nonspecific abnormal finding of lung field: Secondary | ICD-10-CM | POA: Diagnosis not present

## 2022-03-15 NOTE — Progress Notes (Signed)
No evidence of scarring or fibrosis which is great news. Looks like some air was trapped in the lungs after breathing out which we can see with asthma. Is the inhaler helping?  Scan showed small nodules adjacent to the liver by the breathing muscle on the right. Recommend a repeat scan at 3 months to make sure they are stable or getting smaller.

## 2022-03-21 ENCOUNTER — Ambulatory Visit (INDEPENDENT_AMBULATORY_CARE_PROVIDER_SITE_OTHER): Payer: BC Managed Care – PPO | Admitting: Pulmonary Disease

## 2022-03-21 ENCOUNTER — Ambulatory Visit: Payer: BC Managed Care – PPO | Admitting: Pulmonary Disease

## 2022-03-21 ENCOUNTER — Encounter: Payer: Self-pay | Admitting: Pulmonary Disease

## 2022-03-21 VITALS — BP 126/64 | HR 85 | Temp 98.6°F | Ht 70.0 in | Wt 205.0 lb

## 2022-03-21 DIAGNOSIS — R0609 Other forms of dyspnea: Secondary | ICD-10-CM

## 2022-03-21 DIAGNOSIS — R053 Chronic cough: Secondary | ICD-10-CM

## 2022-03-21 MED ORDER — PREDNISONE 20 MG PO TABS: 40.0000 mg | ORAL_TABLET | Freq: Every day | ORAL | 0 refills | Status: AC

## 2022-03-21 MED ORDER — ALBUTEROL SULFATE HFA 108 (90 BASE) MCG/ACT IN AERS
2.0000 | INHALATION_SPRAY | RESPIRATORY_TRACT | 4 refills | Status: DC | PRN
Start: 2022-03-21 — End: 2022-09-12

## 2022-03-21 NOTE — Patient Instructions (Signed)
Full PFT performed today. °

## 2022-03-21 NOTE — Progress Notes (Signed)
$'@Patient'J$  ID: [name], female    DOB: 1960-09-21, 61 y.o.   MRN: [id number]  Chief Complaint  Patient presents with   Follow-up    Pt is here for follow up for DOE. Pt admits she did not start the Advair until Monday do to her being worried it would mess up her ct scan. Pt states she is taking hte albuterol as needed. Pt had full pfts today.     Referring provider: Lesleigh Noe, MD  HPI:   61 y.o. woman whom we are seeing in follow up for evaluation of dyspnea on exertion, chronic cough, chest discomfort felt to be related to asthma.    Presents for planned follow-up.  In the interim CT chest high-res obtained to evaluate potential signs of ILD given underlying Sjogren's disease.  No evidence of ILD.  This did show right lower lobe abutting the diaphragm and nodular opacities.  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  Discussed this and plan for repeat CT scan in 3 months.  PFTs performed today.  These were reviewed in detail with patient shows normal spirometry, no bronchodilator response, normal lung volumes, normal DLCO.  Normal PFTs.  This combined with mosaicism versus even higher suspicion for underlying asthma.  Does explain at at length.  She did not start Advair as prescribed last visit.  She did start taking it this week.  Encouraged her to continue this.  She had lots of questions regarding symptoms and expectations.  These were all answered to the best my ability.  HPI at initial visit: She has in 2015 she had similar episode of dyspnea on exertion.  Notable on going up and down hills.  In [place] area.  This lasted for a few months but seem to resolve on its own.  Do not recall any intervention that make things better or worse.  She was in usual state of health until the late 2020.  Got COVID.  Since then has had a chronic cough.  The episodes or spasms of cough have gradually decreased in terms of frequency  and severity.  However she is A chronic cough ever since then.  Over last 2 years she is developed dyspnea on exertion.  Gradually worsening.  Had been work in the yard for hours at a time without issue.  Now, do about 15 minutes before stopping and resting.  In addition she has some chest discomfort.  Substernal.  Side of her chest.  Not worse with exertion.  Occurs at rest.  Comes and goes.  She endorses longstanding history of reflux.  Heartburn is improved but she continues to have reflux regurgitation symptoms quite frequently.  Associated dry raspy voice.  Cough continues despite PPI therapy.  In addition, she has postnasal drip.  Seasonal allergies.  Does not often have rhinorrhea more dripped on the back of her throat.  Cough is productive of sputum.  She denies any history of asthma.    She had recent chest x-ray 12/2021 and on my review interpretation is clear.  Borderline hyperinflated but cannot quite say so.  She has a history of eosinophilic esophagitis for which she follows with GI in addition to the reflux issues.  Recently diagnosed with Sjogren's within the last year or so.  PMH: Prediabetes, seasonal allergies, neuropathy, Hashimoto's thyroiditis, Sjogren's syndrome Surgical history: Breast augmentation Family history: First-degree relatives with emphysema, asthma Social history: Never smoker, lives in [place], self-employed  Questionaires / Pulmonary Flowsheets:   ACT:      No data to display           MMRC:     No data to display           Epworth:      No data to display           Tests:   FENO:  No results found for: "NITRICOXIDE"  PFT:     No data to display           WALK:      No data to display           Imaging: Personally reviewed and as per EMR discussion in this note CT Chest High Resolution  Result Date: 03/15/2022 CLINICAL DATA:  Sjogren's, interstitial lung disease EXAM: CT CHEST WITHOUT CONTRAST  TECHNIQUE: Multidetector CT imaging of the chest was performed following the standard protocol without intravenous contrast. High resolution imaging of the lungs, as well as inspiratory and expiratory imaging, was performed. RADIATION DOSE REDUCTION: This exam was performed according to the departmental dose-optimization program which includes automated exposure control, adjustment of the mA and/or kV according to patient size and/or use of iterative reconstruction technique. COMPARISON:  CT abdomen pelvis, 02/14/2010 FINDINGS: Cardiovascular: No significant vascular findings. Normal heart size. No pericardial effusion. Mediastinum/Nodes: No enlarged mediastinal, hilar, or axillary lymph nodes. Small hiatal hernia. Thyroid gland, trachea, and esophagus demonstrate no significant findings. Lungs/Pleura: No evidence of fibrotic interstitial lung disease. Mild, lobular air trapping on expiratory phase imaging. Adjacent subpleural nodules of the right lung base overlying the right hemidiaphragm, measuring 1.5 x 1.0 cm (series 2, image 113) and 0.9 x 0.7 cm (series 2, image 112). These are new compared to remote prior examination of the lung bases dated 02/14/2010. Additional 0.3 cm nodule of the dependent left lower lobe, stable and definitively benign (series 4, image 225). No pleural effusion or pneumothorax. Upper Abdomen: No acute abnormality. Musculoskeletal: No chest wall abnormality. No suspicious osseous lesions identified. IMPRESSION: 1. No evidence of fibrotic interstitial lung disease. 2. Mild, lobular air trapping on expiratory phase imaging, suggestive of small airways disease. 3. Adjacent subpleural nodules of the right lung base overlying the right hemidiaphragm, measuring 1.5 x 1.0 cm and 0.9 x 0.7 cm. These are new compared to remote prior examination of the lung bases dated 02/14/2010, nonspecific. Consider one of the following in 3 months for both low-risk and high-risk individuals: (a) repeat chest  CT, (b) follow-up PET-CT, or (c) tissue sampling. Note that PET-CT characterization and tissue sampling may be technically difficult due to location along the diaphragm. This recommendation follows the consensus statement: Guidelines for Management of Incidental Pulmonary Nodules Detected on CT Images: From the Fleischner Society 2017; Radiology 2017; 284:228-243. Electronically Signed   By: Delanna Ahmadi M.D.   On: 03/15/2022 15:32    Lab Results: Personally reviewed, notably eosinophils greater than 300 CBC    Component Value Date/Time   WBC 6.2 01/01/2022 0955   RBC 4.60 01/01/2022 0955   HGB 13.9 01/01/2022 0955   HCT 42.3 01/01/2022 0955   PLT 291 01/01/2022 0955   MCV 92.0 01/01/2022 0955   MCH 30.2 01/01/2022 0955   MCHC 32.9 01/01/2022 0955   RDW 13.3 01/01/2022 0955   LYMPHSABS 1,978 01/01/2022 0955   MONOABS 0.5 06/14/2019 1022   EOSABS 366 01/01/2022 0955   BASOSABS 93 01/01/2022 0955    BMET    Component Value Date/Time  NA 136 01/01/2022 0955   K 4.6 01/01/2022 0955   CL 103 01/01/2022 0955   CO2 22 01/01/2022 0955   GLUCOSE 83 01/01/2022 0955   BUN 23 01/01/2022 0955   CREATININE 0.82 01/01/2022 0955   CALCIUM 9.8 01/01/2022 0955   GFRNONAA 64 11/08/2020 1215   GFRAA 74 11/08/2020 1215    BNP No results found for: "BNP"  ProBNP No results found for: "PROBNP"  Specialty Problems       Pulmonary Problems   Bronchitis with acute wheezing   Chronic cough   Wheezing   Dyspnea on exertion    No Known Allergies  Immunization History  Administered Date(s) Administered   Influenza,inj,Quad PF,6+ Mos 08/10/2014, 08/14/2015, 05/08/2016, 06/23/2017, 09/14/2018, 06/01/2019, 06/04/2021   Tdap 05/01/2016    Past Medical History:  Diagnosis Date   Allergic rhinitis    Anxiety    Cervical dysplasia 1990   s/p conization   Chronic sinusitis    Depression    Fibromyalgia    Hiatal hernia    Hip fracture, left ([place]) 2005   s/p ORIF   Hypothyroidism     Migraine    Neuropathy    Schatzki's ring     Tobacco History: Social History   Tobacco Use  Smoking Status Former   Packs/day: 0.50   Types: Cigarettes   Quit date: 1983   Years since quitting: 40.5   Passive exposure: Never  Smokeless Tobacco Never  Tobacco Comments   Pt states she smoked in high school and college. 01/23/22 ALS    Counseling given: Not Answered Tobacco comments: Pt states she smoked in high school and college. 01/23/22 ALS    Continue to not smoke  Outpatient Encounter Medications as of 03/21/2022  Medication Sig   ARMOUR THYROID 60 MG tablet TAKE 1 TABLET BY MOUTH DAILY BEFORE BREAKFAST   Azelastine HCl 137 MCG/SPRAY SOLN azelastine 137 mcg (0.1 %) nasal spray aerosol   cetirizine (ZYRTEC) 10 MG tablet Take 10 mg by mouth at bedtime.   Cholecalciferol (VITAMIN D PO) Take by mouth daily.   DULoxetine (CYMBALTA) 20 MG capsule Take 2 capsules (40 mg total) by mouth daily.   Estradiol 10 MCG TABS vaginal tablet Place 1 tablet vaginally 2 (two) times a week.   fluticasone-salmeterol (ADVAIR DISKUS) 500-50 MCG/ACT AEPB Inhale 1 puff into the lungs in the morning and at bedtime.   GAMMA AMINOBUTYRIC ACID PO Take 750 mg by mouth daily.   hydrOXYzine (ATARAX/VISTARIL) 50 MG tablet Take 1 tablet (50 mg total) by mouth every 6 (six) hours as needed.   Loteprednol Etabonate (EYSUVIS OP) Apply to eye as needed.   Magnesium Citrate 100 MG TABS Take 200 mg by mouth daily.   meclizine (ANTIVERT) 25 MG tablet Take 1 tablet (25 mg total) by mouth 3 (three) times daily as needed for dizziness.   Melatonin 10 MG TABS Take 10 mg by mouth at bedtime.   metFORMIN (GLUCOPHAGE) 500 MG tablet TAKE 1 TABLET BY MOUTH TWICE DAILY WITH A MEAL   methocarbamol (ROBAXIN) 500 MG tablet Take 1 tablet (500 mg total) by mouth 2 (two) times daily as needed for muscle spasms.   nystatin ointment (MYCOSTATIN) Apply 1 application topically 2 (two) times daily. (Patient taking differently: Apply  1 application  topically as needed.)   Omega-3 Fatty Acids (FISH OIL PO) Take by mouth daily.   OVER THE COUNTER MEDICATION Seleium 200 mg   pantoprazole (PROTONIX) 40 MG tablet Take 1 tablet (40 mg  total) by mouth 2 (two) times daily before a meal.   predniSONE (DELTASONE) 20 MG tablet Take 2 tablets (40 mg total) by mouth daily with breakfast for 5 days.   triamcinolone (KENALOG) 0.1 % Apply 1 application topically 2 (two) times daily. (Patient taking differently: Apply 1 application  topically as needed.)   TURMERIC CURCUMIN PO Take 1 tablet by mouth daily.   vitamin E 400 UNIT capsule Take 400 Units by mouth daily.   [DISCONTINUED] albuterol (VENTOLIN HFA) 108 (90 Base) MCG/ACT inhaler Inhale 2 puffs into the lungs every 4 (four) hours as needed for wheezing or shortness of breath.   albuterol (VENTOLIN HFA) 108 (90 Base) MCG/ACT inhaler Inhale 2 puffs into the lungs every 4 (four) hours as needed for wheezing or shortness of breath.   No facility-administered encounter medications on file as of 03/21/2022.     Review of Systems  Review of Systems  N/a Physical Exam  BP 126/64 (BP Location: Left Arm, Patient Position: Sitting, Cuff Size: Normal)   Pulse 85   Temp 98.6 F (37 C) (Oral)   Ht '5\' 10"'$  (1.778 m)   Wt 205 lb (93 kg)   LMP 06/27/2019   SpO2 98%   BMI 29.41 kg/m   Wt Readings from Last 5 Encounters:  03/21/22 205 lb (93 kg)  03/08/22 204 lb 6 oz (92.7 kg)  02/01/22 211 lb (95.7 kg)  01/24/22 211 lb (95.7 kg)  01/23/22 209 lb (94.8 kg)    BMI Readings from Last 5 Encounters:  03/21/22 29.41 kg/m  03/08/22 29.32 kg/m  02/01/22 30.28 kg/m  01/24/22 30.28 kg/m  01/23/22 29.99 kg/m     Physical Exam General: Well-appearing, sitting in chair Eyes: EOMI, icterus Neck: Supple: No JVP Pulmonary: Clear, no crackles, normal work of breathing Cardiovascular: Regular rhythm, no murmur Abdomen: Not think about hospice MSK: No synovitis, joint effusion Neuro:  Normal gait, no weakness Psych: Normal mood, full affect   Assessment & Plan:   Dyspnea on exertion: Suspect multifactorial.  Worsened after COVID infection.  Suspicious for reactive airways disease/asthma.  Further suspicion of this based on normal PFTs and signs of mosaicism on CT scan 02/2022.  Trial high-dose Advair discus and assess response, just now starting..  Other contributions are likely weight given increase in weight of 30 pounds over the last couple years.  Also, concern for deconditioning given decrease in activity over time.  Consider cardiac work-up if not respond to therapy  Asthma: Based on mosaicism, normal PFTs.  Worsening after COVID infection.  Trial high-dose ICS/LABA.  Consider escalating to triple inhaled therapy in the future if not improving.  She has evidence of eosinophilia on labs up to 500.  Possible eosinophilic component difficult to treat with inhalers alone.  Consider Biologics in the future.  Chest discomfort: Substernal.  Not reliably reproduced with exertion.  Suspect related to reflux versus possible discomfort in the setting of asthma.  Has upcoming GI follow-up.  Assess response to Advair.   Return in about 3 months (around 06/21/2022).   Lanier Clam, MD 03/21/2022   I spent 45 minutes in the care of the patient including face-to-face visit, coordination of care, review of records.

## 2022-03-21 NOTE — Progress Notes (Signed)
Full PFT performed today. °

## 2022-03-21 NOTE — Patient Instructions (Addendum)
Nice to see you again  Continue the Advair every day.  Continue albuterol as needed.  I refilled albuterol today.  It likely takes 15 to 30 minutes to kick in and start working.  I sent a prescription for prednisone 40 mg every day for the next 5 days.  Then stop.  Let see if this helps calm down things and improve your breathing.  Please talk with [facility] and if there is a more cost effective imaging center I will be happy to order the CT scan to follow-up the small nodules in the right bottom part of the lung at that imaging center.  I recommend we do this in October to follow-up your recent CT scan, 1-monthinterval.  Return to clinic in 3 months or sooner as needed with Dr. HSilas Floodwe can discuss CT scan results at that time as well as how your symptoms are doing.  In the meantime, please send me a message on MyChart or call our office and let me know if the Advair and prednisone are helping or not.  We can make medication changes via MyChart.

## 2022-03-22 ENCOUNTER — Encounter: Payer: Self-pay | Admitting: Pulmonary Disease

## 2022-03-26 ENCOUNTER — Encounter: Payer: Self-pay | Admitting: Family Medicine

## 2022-03-26 DIAGNOSIS — G6289 Other specified polyneuropathies: Secondary | ICD-10-CM

## 2022-03-27 ENCOUNTER — Encounter: Payer: Self-pay | Admitting: Pulmonary Disease

## 2022-03-27 DIAGNOSIS — R0609 Other forms of dyspnea: Secondary | ICD-10-CM

## 2022-03-27 MED ORDER — GABAPENTIN 100 MG PO CAPS: ORAL_CAPSULE | ORAL | 0 refills | Status: DC

## 2022-03-28 NOTE — Telephone Encounter (Signed)
Patient contacted me via MyChart with worsening symptoms of dyspnea despite ICS/LABA therapy high-dose Advair as well as prednisone therapy for presumed asthma based on normal PFTs and mosaicism seen on CT scan.  Recent PFTs with DLCO normal but given ongoing symptoms refractory to asthma therapy, feel like it is necessary to rule out pulmonary embolism as well as reevaluate cardiac causes of dyspnea including pulmonary hypertension with a echocardiogram.  CTA PE protocol ordered as well as echocardiogram for further evaluation.  Consider escalating asthma therapy to triple inhaled therapy and consider biologic skin eosinophils of over 500 recently, notably she is also being treated for eosinophilic esophagitis.  Possible that she has steroid resistant disease in the setting of eosinophilia which is certainly a possibility.  However, need to evaluate with the above interventions prior to making these changes.

## 2022-03-28 NOTE — Telephone Encounter (Signed)
Dr. Silas Flood, please advise on pt's email regarding her breathing and advair/pred. Thank you!

## 2022-03-29 LAB — PULMONARY FUNCTION TEST
DL/VA % pred: 107 %
DL/VA: 4.34 ml/min/mmHg/L
DLCO cor % pred: 86 %
DLCO cor: 21.29 ml/min/mmHg
DLCO unc % pred: 86 %
DLCO unc: 21.29 ml/min/mmHg
FEF 25-75 Post: 3.3 L/sec
FEF 25-75 Pre: 2.52 L/sec
FEF2575-%Change-Post: 30 %
FEF2575-%Pred-Post: 119 %
FEF2575-%Pred-Pre: 91 %
FEV1-%Change-Post: 4 %
FEV1-%Pred-Post: 89 %
FEV1-%Pred-Pre: 84 %
FEV1-Post: 2.83 L
FEV1-Pre: 2.7 L
FEV1FVC-%Change-Post: 4 %
FEV1FVC-%Pred-Pre: 102 %
FEV6-%Change-Post: 0 %
FEV6-%Pred-Post: 85 %
FEV6-%Pred-Pre: 84 %
FEV6-Post: 3.39 L
FEV6-Pre: 3.37 L
FEV6FVC-%Pred-Post: 103 %
FEV6FVC-%Pred-Pre: 103 %
FVC-%Change-Post: 0 %
FVC-%Pred-Post: 82 %
FVC-%Pred-Pre: 82 %
FVC-Post: 3.39 L
FVC-Pre: 3.37 L
Post FEV1/FVC ratio: 83 %
Post FEV6/FVC ratio: 100 %
Pre FEV1/FVC ratio: 80 %
Pre FEV6/FVC Ratio: 100 %
RV % pred: 87 %
RV: 1.98 L
TLC % pred: 97 %
TLC: 5.83 L

## 2022-04-09 ENCOUNTER — Ambulatory Visit
Admission: RE | Admit: 2022-04-09 | Discharge: 2022-04-09 | Disposition: A | Discharge status: Home / Self Care | Payer: BC Managed Care – PPO | Source: Ambulatory Visit | Attending: Pulmonary Disease | Admitting: Pulmonary Disease

## 2022-04-09 DIAGNOSIS — J929 Pleural plaque without asbestos: Secondary | ICD-10-CM | POA: Diagnosis not present

## 2022-04-09 DIAGNOSIS — J984 Other disorders of lung: Secondary | ICD-10-CM | POA: Diagnosis not present

## 2022-04-09 DIAGNOSIS — K7689 Other specified diseases of liver: Secondary | ICD-10-CM | POA: Diagnosis not present

## 2022-04-09 DIAGNOSIS — R0609 Other forms of dyspnea: Secondary | ICD-10-CM

## 2022-04-09 MED ORDER — IOPAMIDOL (ISOVUE-370) INJECTION 76%
75.0000 mL | Freq: Once | INTRAVENOUS | Status: AC | PRN
  Administered 2022-04-09: 75 mL via INTRAVENOUS

## 2022-04-10 ENCOUNTER — Ambulatory Visit: Payer: BC Managed Care – PPO | Admitting: Family Medicine

## 2022-04-16 ENCOUNTER — Encounter: Payer: Self-pay | Admitting: Pulmonary Disease

## 2022-04-16 NOTE — Telephone Encounter (Signed)
Dr. Silas Flood, please advise on pt's message. She is requesting the results of her recent CTA. Thanks.

## 2022-04-17 DIAGNOSIS — D225 Melanocytic nevi of trunk: Secondary | ICD-10-CM | POA: Diagnosis not present

## 2022-04-17 DIAGNOSIS — Z1283 Encounter for screening for malignant neoplasm of skin: Secondary | ICD-10-CM | POA: Diagnosis not present

## 2022-04-17 DIAGNOSIS — L821 Other seborrheic keratosis: Secondary | ICD-10-CM | POA: Diagnosis not present

## 2022-04-17 DIAGNOSIS — L304 Erythema intertrigo: Secondary | ICD-10-CM | POA: Diagnosis not present

## 2022-04-18 ENCOUNTER — Telehealth: Payer: Self-pay

## 2022-04-18 DIAGNOSIS — Z808 Family history of malignant neoplasm of other organs or systems: Secondary | ICD-10-CM

## 2022-04-18 DIAGNOSIS — L570 Actinic keratosis: Secondary | ICD-10-CM

## 2022-04-18 NOTE — Telephone Encounter (Signed)
My chart message sent to pt.

## 2022-04-18 NOTE — Telephone Encounter (Signed)
Patient called in stating she sees a dermatologist, Dr.Mcconnell but is not finding her care adequate any longer and would like to establish somewhere else. Wondering if we can place a referral to a new dermatologist.

## 2022-04-18 NOTE — Telephone Encounter (Signed)
Please find out what she primarily sees Dermatology for.   Ask if she has preference for where she would like to go.   Tell her it may take 6+ months to be seen

## 2022-04-19 DIAGNOSIS — Z808 Family history of malignant neoplasm of other organs or systems: Secondary | ICD-10-CM | POA: Insufficient documentation

## 2022-04-19 DIAGNOSIS — L570 Actinic keratosis: Secondary | ICD-10-CM | POA: Insufficient documentation

## 2022-04-19 NOTE — Telephone Encounter (Signed)
Routing to Dr. Darnell Level to review [facility] request

## 2022-04-19 NOTE — Addendum Note (Signed)
Addended by: Lesleigh Noe on: 04/19/2022 08:04 AM   Modules accepted: Orders

## 2022-04-20 ENCOUNTER — Other Ambulatory Visit: Payer: Self-pay | Admitting: Family Medicine

## 2022-04-20 DIAGNOSIS — G6289 Other specified polyneuropathies: Secondary | ICD-10-CM

## 2022-04-22 ENCOUNTER — Ambulatory Visit (HOSPITAL_COMMUNITY): Payer: BC Managed Care – PPO | Attending: Pulmonary Disease

## 2022-04-22 DIAGNOSIS — R0609 Other forms of dyspnea: Secondary | ICD-10-CM | POA: Diagnosis not present

## 2022-04-22 LAB — ECHOCARDIOGRAM COMPLETE
Area-P 1/2: 2.8 cm2
S' Lateral: 3.5 cm

## 2022-04-22 NOTE — Progress Notes (Signed)
Echocardiogram is normal - which is great news!

## 2022-04-22 NOTE — Telephone Encounter (Signed)
Last office visit 03/08/22 for Depression, Polyneuropathy, Overweight, Fibromyalgia.  Last refilled 03/27/22 for #75 with no refills.  No future appointments with PCP.

## 2022-04-22 NOTE — Telephone Encounter (Addendum)
Ok to schedule with me

## 2022-04-23 ENCOUNTER — Other Ambulatory Visit: Payer: Self-pay | Admitting: Family Medicine

## 2022-04-23 DIAGNOSIS — G6289 Other specified polyneuropathies: Secondary | ICD-10-CM

## 2022-04-23 MED ORDER — GABAPENTIN 100 MG PO CAPS: 100.0000 mg | ORAL_CAPSULE | Freq: Three times a day (TID) | ORAL | 0 refills | Status: DC

## 2022-04-23 NOTE — Telephone Encounter (Signed)
Refill request Gabapentin Last refill 03/27/22 #75 Last office visit 714/23

## 2022-04-23 NOTE — Telephone Encounter (Signed)
Refill provided

## 2022-04-24 NOTE — Progress Notes (Signed)
Recommend high dose (200 mcg) Trelegy one puff once daily and stop Advair once this is started - rinse mouth after every use of Trelegy. Recommend a methacholine challenge test as a negative test would effectively rule out asthma. Recommend referral to cardiology for evaluation of dyspnea on exertion.

## 2022-04-25 ENCOUNTER — Ambulatory Visit (HOSPITAL_COMMUNITY): Payer: Self-pay

## 2022-04-26 NOTE — Progress Notes (Signed)
Recommend high dose (200 mcg) Trelegy one puff once daily and stop Advair once this is started - rinse mouth after every use of Trelegy. Recommend a methacholine challenge test as a negative test would effectively rule out asthma. Recommend referral to cardiology for evaluation of dyspnea on exertion.

## 2022-04-30 MED ORDER — TRELEGY ELLIPTA 200-62.5-25 MCG/ACT IN AEPB: 1.0000 | INHALATION_SPRAY | Freq: Every day | RESPIRATORY_TRACT | 2 refills | Status: DC

## 2022-04-30 NOTE — Progress Notes (Signed)
Ok to finish current Advair and try trelegy once out - 200 mcg dose 1 puff daily.

## 2022-05-03 ENCOUNTER — Encounter: Payer: Self-pay | Admitting: Family Medicine

## 2022-05-06 NOTE — Telephone Encounter (Signed)
Last filled: 10/29/20 Last OV: 03/08/22 Next OV: 06/19/22 for TOC

## 2022-05-07 MED ORDER — TRIAMCINOLONE ACETONIDE 0.1 % EX CREA: 1.0000 | TOPICAL_CREAM | CUTANEOUS | 0 refills | Status: DC | PRN

## 2022-05-14 ENCOUNTER — Encounter: Payer: Self-pay | Admitting: Cardiovascular Disease

## 2022-05-14 ENCOUNTER — Encounter: Payer: Self-pay | Admitting: Pulmonary Disease

## 2022-05-14 ENCOUNTER — Ambulatory Visit: Payer: BC Managed Care – PPO | Attending: Cardiovascular Disease | Admitting: Cardiovascular Disease

## 2022-05-14 VITALS — BP 110/78 | HR 67 | Ht 70.0 in | Wt 208.5 lb

## 2022-05-14 DIAGNOSIS — M3509 Sicca syndrome with other organ involvement: Secondary | ICD-10-CM

## 2022-05-14 DIAGNOSIS — R5383 Other fatigue: Secondary | ICD-10-CM | POA: Diagnosis not present

## 2022-05-14 DIAGNOSIS — R0602 Shortness of breath: Secondary | ICD-10-CM | POA: Diagnosis not present

## 2022-05-14 NOTE — Patient Instructions (Addendum)
Medication Instructions:  No changes  If you need a refill on your cardiac medications before your next appointment, please call your pharmacy.    Lab work: No new labs needed   Testing/Procedures:  1) Cardiopulmonary Stress Test (CPX) -Your physician has recommended that you have a cardiopulmonary stress test (CPX). CPX testing is a non-invasive measurement of heart and lung function. It replaces a traditional treadmill stress test. This type of test provides a tremendous amount of information that relates not only to your present condition but also for future outcomes. This test combines measurements of you ventilation, respiratory gas exchange in the lungs, electrocardiogram (EKG), blood pressure and physical response before, during, and following an exercise protocol.  Please call [phone] to schedule your test  You are scheduled for a Cardiopulmonary Exercise (CPX) Test @ [facility] on: __ / __ / ___ @ ___:____AM/PM.   Expect to be in the lab for 2 hours.   Please arrivee @ [place] (main entrance A). Free valet parking is available. You will proceed to admitting 30 minutes prior to your scheduled appointment. You may be asked to rescheduled if you arrive 20 minutes or more after your scheduled appointment time.   After checking in through admitting, proceed to [facility] waiting room.   Instructions for day of the test: - refrain from ingesting a heavy meal, alcohol, or caffeine or using tobacco products within 2 hours of the test - please DO NOT FAST for more than 8 hours - you may have other non-alcoholic, caffeine-free beverages, a light snack (crackers, piece of fruit, toast, bagel, etc) up to your appointment time  - avoid significant exertion or exercise within 24 hours of your test - be prepared to exercise & sweat - your clothing should permit freedom of movement and include running/walking shoes - women should bring loose-fitting,  short-sleeve blouse  - this evaluation may be fatiguing and you may wish to have someone accompany you to the assessment and drive you home afterward  - bring a list of medications with you (including dose, frequency) - take all medications as prescribed unless directed otherwise by your physician    General description of test:  A brief lung function test will be performed. This will involve you taking deep breaths and blowing out hard & fast through your mouth. During these, a nose clip will be on your nose and you will be breathing through a breathing device.   For the exercise portion of the test, you will be walking on a motorized treadmill or riding a stationary bike to your maximal effort or until symptoms such as chest pain, shortness of breath, leg pain or dizziness limit your exercise. Like the lung function test, you will be breathing in and out of a breathing device through your mouth with the nose clip. Your heart rate, ECG, blood pressure, oxygen saturation, breathing rate & depth, amount of oxygen you consume and amount of carbon dioxide you produce will be measured and monitored throughout the test.   If you need to cancel or reschedule your test, call [phone].     Follow-Up: At [facility], you and your health needs are our priority.  As part of our continuing mission to provide you with exceptional heart care, we have created designated Provider Care Teams.  These Care Teams include your primary Cardiologist (physician) and Advanced Practice Providers (APPs -  Physician Assistants and Nurse Practitioners) who all work together to provide you  with the care you need, when you need it.  You will need a follow up appointment as needed  Providers on your designated Care Team:   Murray Hodgkins, NP Christell Faith, PA-C Cadence Kathlen Mody, [place]  COVID-19 Vaccine Information can be found at: [email or web address] For  questions related to vaccine distribution or appointments, please email [email or web address] or call [phone].

## 2022-05-14 NOTE — Progress Notes (Addendum)
Cardiology Office Note  Date:  05/14/2022   ID:  Marquisa, Salih October 30, 1960, MRN [id number]  PCP:  Lesleigh Noe, MD   Chief Complaint  Patient presents with   Shortness of Breath    Patient c/o mid-sternum pain; worse when coughing or if lying on her left side, weight gain with mostly in the abdomen, sweats with the least amount of activity, shortness of breath with little exertion and dizziness. Medications reviewed by the patient verbally.     HPI:  Ms. Goldbach is a 61 year old woman with history of  chronic migraines,  previously seen in clinic in 2015 and 16 for symptoms of lightheadedness, tachycardia. She presents today by referral from Dr. Larey Days for dyspnea on exertion  Discussed recent events over the past several years Covid in 2020, lost parents Less exercise, weight has trended upwards Doing yard work gets short of breath, exacerbated by heat and when using her upper body  Has been treated for Hashimoto's and Sjogren's   Started developing symptoms in 08/2021, increasing SOB shortness of breath, seen by pulmonary, reports inhalers not helping albuterol/adviar  Stopped exercise Has a Peloton at home but has not been using it on a regular basis Having periodic hot flashes. Frustrated that she has to stop what she is doing to catch her breath especially when working outside doing yard work  Echocardiogram August 2023 Showing normal study  EKG personally reviewed by myself on todays visit Normal sinus rhythm rate 67 bpm no significant ST-T wave changes  PMH:   has a past medical history of Allergic rhinitis, Anxiety, Cervical dysplasia (1990), Chronic sinusitis, Depression, Fibromyalgia, Hashimoto's disease, Hiatal hernia, Hip fracture, left ([place]) (2005), Hypothyroidism, Migraine, Neuropathy, Schatzki's ring, and Sjogren syndrome, unspecified ([place]).  PSH:    Past Surgical History:  Procedure Laterality Date   BREAST ENHANCEMENT SURGERY  2007    BREAST IMPLANT REMOVAL Bilateral 05/21/2018   DILATION AND CURETTAGE OF UTERUS  1989   ORIF HIP FRACTURE  2005   [place]   TONSILLECTOMY AND ADENOIDECTOMY     TOTAL HIP ARTHROPLASTY Left 2005   second surgery     Current Outpatient Medications  Medication Sig Dispense Refill   albuterol (VENTOLIN HFA) 108 (90 Base) MCG/ACT inhaler Inhale 2 puffs into the lungs every 4 (four) hours as needed for wheezing or shortness of breath. 18 g 4   ARMOUR THYROID 60 MG tablet TAKE 1 TABLET BY MOUTH DAILY BEFORE BREAKFAST 90 tablet 3   Azelastine HCl 137 MCG/SPRAY SOLN azelastine 137 mcg (0.1 %) nasal spray aerosol     cetirizine (ZYRTEC) 10 MG tablet Take 10 mg by mouth at bedtime.     Cholecalciferol (VITAMIN D PO) Take by mouth daily.     Estradiol 10 MCG TABS vaginal tablet Place 1 tablet vaginally 2 (two) times a week.     fluticasone-salmeterol (ADVAIR DISKUS) 500-50 MCG/ACT AEPB Inhale 1 puff into the lungs in the morning and at bedtime. 60 each 3   gabapentin (NEURONTIN) 100 MG capsule Take 1 capsule (100 mg total) by mouth 3 (three) times daily. 270 capsule 0   GAMMA AMINOBUTYRIC ACID PO Take 750 mg by mouth daily.     hydrOXYzine (ATARAX/VISTARIL) 50 MG tablet Take 1 tablet (50 mg total) by mouth every 6 (six) hours as needed. 30 tablet 0   Loteprednol Etabonate (EYSUVIS OP) Apply to eye as needed.     Magnesium Citrate 100 MG TABS Take 200 mg by mouth daily.  meclizine (ANTIVERT) 25 MG tablet Take 1 tablet (25 mg total) by mouth 3 (three) times daily as needed for dizziness. 30 tablet 0   Melatonin 10 MG TABS Take 10 mg by mouth at bedtime.     metFORMIN (GLUCOPHAGE) 500 MG tablet TAKE 1 TABLET BY MOUTH TWICE DAILY WITH A MEAL 180 tablet 1   methocarbamol (ROBAXIN) 500 MG tablet Take 1 tablet (500 mg total) by mouth 2 (two) times daily as needed for muscle spasms. 60 tablet 1   nystatin ointment (MYCOSTATIN) Apply 1 application topically 2 (two) times daily. (Patient taking differently:  Apply 1 application  topically as needed.) 30 g 0   Omega-3 Fatty Acids (FISH OIL PO) Take by mouth daily.     OVER THE COUNTER MEDICATION Seleium 200 mg     pantoprazole (PROTONIX) 40 MG tablet Take 1 tablet (40 mg total) by mouth 2 (two) times daily before a meal. 60 tablet 1   triamcinolone cream (KENALOG) 0.1 % Apply 1 Application topically as needed. 15 g 0   vitamin E 400 UNIT capsule Take 400 Units by mouth daily.     TURMERIC CURCUMIN PO Take 1 tablet by mouth daily. (Patient not taking: Reported on 05/14/2022)     No current facility-administered medications for this visit.     Allergies:   Patient has no known allergies.   Social History:  The patient  reports that she quit smoking about 40 years ago. Her smoking use included cigarettes. She smoked an average of .5 packs per day. She has never been exposed to tobacco smoke. She has never used smokeless tobacco. She reports that she does not currently use alcohol. She reports that she does not use drugs.   Family History:   family history includes Alcohol abuse in her brother; Allergies in her daughter and son; Appendicitis in her son; Arthritis in her maternal grandmother; Arthritis/Rheumatoid in her mother; Asthma in her father and mother; Basal cell carcinoma in her father; Breast cancer (age of onset: 37) in her maternal aunt; Drug abuse in her brother; Eczema in her son; Healthy in her daughter and son; Heart disease in her maternal grandmother; Irregular heart beat in her father; Lung cancer in her paternal grandfather; Migraines in her son.    Review of Systems: Review of Systems  Constitutional: Negative.   HENT: Negative.    Respiratory:  Positive for shortness of breath.   Cardiovascular: Negative.   Gastrointestinal: Negative.   Musculoskeletal: Negative.   Neurological: Negative.   Psychiatric/Behavioral: Negative.    All other systems reviewed and are negative.   PHYSICAL EXAM: VS:  BP 110/78 (BP Location: Left  Arm, Patient Position: Sitting, Cuff Size: Normal)   Pulse 67   Ht '5\' 10"'$  (1.778 m)   Wt 208 lb 8 oz (94.6 kg)   LMP 06/27/2019   SpO2 98%   BMI 29.92 kg/m  , BMI Body mass index is 29.92 kg/m. GEN: Well nourished, well developed, in no acute distress HEENT: normal Neck: no JVD, carotid bruits, or masses Cardiac: RRR; no murmurs, rubs, or gallops,no edema  Respiratory:  clear to auscultation bilaterally, normal work of breathing GI: soft, nontender, nondistended, + BS MS: no deformity or atrophy Skin: warm and dry, no rash Neuro:  Strength and sensation are intact Psych: euthymic mood, full affect   Recent Labs: 06/04/2021: TSH 1.10 01/01/2022: ALT 13; BUN 23; Creat 0.82; Hemoglobin 13.9; Platelets 291; Potassium 4.6; Sodium 136    Lipid Panel Lab Results  Component Value Date   CHOL 217 (H) 06/04/2021   HDL 79.30 06/04/2021   LDLCALC 118 (H) 06/04/2021   TRIG 100.0 06/04/2021      Wt Readings from Last 3 Encounters:  05/14/22 208 lb 8 oz (94.6 kg)  03/21/22 205 lb (93 kg)  03/08/22 204 lb 6 oz (92.7 kg)     ASSESSMENT AND PLAN:  Problem List Items Addressed This Visit     Sjogren's disease ([place])   Fatigue   Other Visit Diagnoses     SOB (shortness of breath) on exertion    -  Primary   Relevant Orders   EKG 12-Lead   Cardiopulmonary exercise test      Shortness of breath Has tried inhalers with no improvement in symptoms Echocardiogram with no notable findings, normal ejection fraction, no significant valvular heart disease For further evaluation we have recommended cardiopulmonary stress testing (CPX) in [place] Certainly possible symptoms are from weight gain and deconditioning Recommended low carbohydrate diet for gradual weight loss, gradual return to her exercise program in the air conditioning to start.  She does have access to Peloton bike No medication changes made Less likely ischemia, we did discuss cardiac CTA but she has had 2 recent CT  scans and concerned about radiation exposure    Total encounter time more than 60 minutes  Greater than 50% was spent in counseling and coordination of care with the patient  Patient was seen in consultation for Dr. Larey Days and will be referred back to his office for ongoing care of the issues detailed above  Signed, Esmond Plants, M.D., Ph.D. [place], [place]

## 2022-05-20 ENCOUNTER — Ambulatory Visit (HOSPITAL_COMMUNITY): Payer: BC Managed Care – PPO | Attending: Cardiology

## 2022-05-20 DIAGNOSIS — R0602 Shortness of breath: Secondary | ICD-10-CM

## 2022-05-24 DIAGNOSIS — H04123 Dry eye syndrome of bilateral lacrimal glands: Secondary | ICD-10-CM | POA: Diagnosis not present

## 2022-05-24 DIAGNOSIS — H353121 Nonexudative age-related macular degeneration, left eye, early dry stage: Secondary | ICD-10-CM | POA: Diagnosis not present

## 2022-05-29 ENCOUNTER — Ambulatory Visit: Payer: BC Managed Care – PPO | Admitting: Pulmonary Disease

## 2022-06-10 ENCOUNTER — Encounter: Payer: Self-pay | Admitting: Family Medicine

## 2022-06-10 DIAGNOSIS — K219 Gastro-esophageal reflux disease without esophagitis: Secondary | ICD-10-CM

## 2022-06-10 DIAGNOSIS — R7303 Prediabetes: Secondary | ICD-10-CM

## 2022-06-12 MED ORDER — METFORMIN HCL 500 MG PO TABS: 500.0000 mg | ORAL_TABLET | Freq: Two times a day (BID) | ORAL | 1 refills | Status: DC

## 2022-06-12 MED ORDER — PANTOPRAZOLE SODIUM 40 MG PO TBEC: 40.0000 mg | DELAYED_RELEASE_TABLET | Freq: Two times a day (BID) | ORAL | 1 refills | Status: DC

## 2022-06-19 ENCOUNTER — Ambulatory Visit: Payer: BC Managed Care – PPO | Admitting: Family Medicine

## 2022-06-19 ENCOUNTER — Encounter: Payer: Self-pay | Admitting: Family Medicine

## 2022-06-19 VITALS — BP 116/68 | HR 69 | Temp 97.8°F | Ht 70.0 in | Wt 207.1 lb

## 2022-06-19 DIAGNOSIS — E663 Overweight: Secondary | ICD-10-CM | POA: Diagnosis not present

## 2022-06-19 DIAGNOSIS — R7303 Prediabetes: Secondary | ICD-10-CM | POA: Diagnosis not present

## 2022-06-19 DIAGNOSIS — M797 Fibromyalgia: Secondary | ICD-10-CM

## 2022-06-19 DIAGNOSIS — Z23 Encounter for immunization: Secondary | ICD-10-CM | POA: Diagnosis not present

## 2022-06-19 DIAGNOSIS — R0609 Other forms of dyspnea: Secondary | ICD-10-CM | POA: Diagnosis not present

## 2022-06-19 DIAGNOSIS — F411 Generalized anxiety disorder: Secondary | ICD-10-CM

## 2022-06-19 DIAGNOSIS — R079 Chest pain, unspecified: Secondary | ICD-10-CM

## 2022-06-19 DIAGNOSIS — E063 Autoimmune thyroiditis: Secondary | ICD-10-CM

## 2022-06-19 DIAGNOSIS — L509 Urticaria, unspecified: Secondary | ICD-10-CM

## 2022-06-19 DIAGNOSIS — M3509 Sicca syndrome with other organ involvement: Secondary | ICD-10-CM

## 2022-06-19 DIAGNOSIS — E038 Other specified hypothyroidism: Secondary | ICD-10-CM

## 2022-06-19 DIAGNOSIS — G6289 Other specified polyneuropathies: Secondary | ICD-10-CM

## 2022-06-19 DIAGNOSIS — F325 Major depressive disorder, single episode, in full remission: Secondary | ICD-10-CM

## 2022-06-19 DIAGNOSIS — R5383 Other fatigue: Secondary | ICD-10-CM

## 2022-06-19 DIAGNOSIS — K200 Eosinophilic esophagitis: Secondary | ICD-10-CM

## 2022-06-19 LAB — CBC WITH DIFFERENTIAL/PLATELET
Basophils Absolute: 0.1 10*3/uL (ref 0.0–0.1)
Basophils Relative: 1 % (ref 0.0–3.0)
Eosinophils Absolute: 0.2 10*3/uL (ref 0.0–0.7)
Eosinophils Relative: 3.3 % (ref 0.0–5.0)
HCT: 37 % (ref 36.0–46.0)
Hemoglobin: 12.4 g/dL (ref 12.0–15.0)
Lymphocytes Relative: 30 % (ref 12.0–46.0)
Lymphs Abs: 2.2 10*3/uL (ref 0.7–4.0)
MCHC: 33.4 g/dL (ref 30.0–36.0)
MCV: 91.6 fl (ref 78.0–100.0)
Monocytes Absolute: 0.6 10*3/uL (ref 0.1–1.0)
Monocytes Relative: 7.8 % (ref 3.0–12.0)
Neutro Abs: 4.2 10*3/uL (ref 1.4–7.7)
Neutrophils Relative %: 57.9 % (ref 43.0–77.0)
Platelets: 315 10*3/uL (ref 150.0–400.0)
RBC: 4.04 Mil/uL (ref 3.87–5.11)
RDW: 14.6 % (ref 11.5–15.5)
WBC: 7.3 10*3/uL (ref 4.0–10.5)

## 2022-06-19 LAB — HEMOGLOBIN A1C: Hgb A1c MFr Bld: 6.4 % (ref 4.6–6.5)

## 2022-06-19 MED ORDER — ALPRAZOLAM 0.25 MG PO TABS: 0.2500 mg | ORAL_TABLET | Freq: Two times a day (BID) | ORAL | 0 refills | Status: DC | PRN

## 2022-06-19 NOTE — Patient Instructions (Addendum)
Flu shot today  Labs today.  Check A1c today.  Call and schedule mammogram with GYN.  Try to focus most gabapentin to evening dosing (maybe 1 in late afternoon and 1-2 at bedtime).  Ok to try xanax as needed for anxiety.  Return in 1 month for physical.

## 2022-06-19 NOTE — Progress Notes (Unsigned)
Patient ID: [name], female    DOB: 20-Jan-1961, 61 y.o.   MRN: [id number]  This visit was conducted in person.  BP 116/68   Pulse 69   Temp 97.8 F (36.6 C) (Temporal)   Ht '5\' 10"'$  (1.778 m)   Wt 207 lb 2 oz (94 kg)   LMP 06/27/2019   SpO2 98%   BMI 29.72 kg/m    CC: transfer of care  Subjective:   HPI: [name] is a 61 y.o. female presenting on 06/19/2022 for Transitions Of Care (Here for [facility] from Dr. Einar Pheasant.  Also, here for f/u of mood and nerve pain for which she was prescribed Cymbalta. )   Last CPE 05/2021.   I previously saw her daughter [name] until she moved.  [name] has had several PCPs over the years who have left our office in interim.   She has h/o hashimoto and sjogren's disease as well as fibromyalgia, depression, anxiety. Notes right eye twitching for 2 months. Dry eyes from sjogren - managing with eye drops through Dr Mardene Speak at [facility] eye. H/o chronic migraines for 5 yrs that is now better. Notes neuropathy to feet > hands, occasional brain zap. No numbness/weakness.   60 lb weight gain over the past 3 years (from 140 lbs 2020). She slowly was tapered off cymbalta due to concern over this med contributing to weight gain. Plan was to try gabapentin if nerve/fibromyalgia pain returned. Menopause started 3 yrs ago. COVID infection 07/19/2019. Both parents passed away in the past 3 yrs as well. She had breast implants removed 04/2018.   She's recently seen cardiology [facility] Situ) and pulmonology [facility]) for evaluation of chest pain and exertional dyspnea since Jan 2023, activity limiting. Chest pain noticeable when coughing/sneezing. Most noticeable shortness of breath when working at home outdoors. ?RAD/asthma - no improvement with inhalers. Echocardiogram 03/2022 returned normal. High resolution CT negative for ILD, but concern for possible small airway disease with planned rpt 3 months. PFTs normal. [facility] recommended cardiopulmonary stress test  (CPX) in [place] - normal per patient. She cancelled pulm f/u.   She's restarted exercise bicycling and tolerating this well without shortness of breath.   Occ hives, unclear trigger. She has hydroxyzine '50mg'$  PRN for this. No fevers/chills.   She's recently seen GI as well for GERD with eosinophilic esophagitis - pantoprazole increased to '40mg'$  BID. Latest EGD 01/2022 - WNL with 1-2cm HH. She's back down to once daily pantoprazole.  Last mammogram 07-18-2020 - through GYN, but she previously saw breast center 01/2019. She will return to GYN for rpt mammogram.       Relevant past medical, surgical, family and social history reviewed and updated as indicated. Interim medical history since our last visit reviewed. Allergies and medications reviewed and updated. Outpatient Medications Prior to Visit  Medication Sig Dispense Refill   albuterol (VENTOLIN HFA) 108 (90 Base) MCG/ACT inhaler Inhale 2 puffs into the lungs every 4 (four) hours as needed for wheezing or shortness of breath. 18 g 4   ARMOUR THYROID 60 MG tablet TAKE 1 TABLET BY MOUTH DAILY BEFORE BREAKFAST 90 tablet 3   cetirizine (ZYRTEC) 10 MG tablet Take 10 mg by mouth at bedtime.     Cholecalciferol (VITAMIN D PO) Take by mouth daily.     gabapentin (NEURONTIN) 100 MG capsule Take 1 capsule (100 mg total) by mouth 3 (three) times daily. 270 capsule 0   GAMMA AMINOBUTYRIC ACID PO Take 750 mg by mouth daily.  hydrOXYzine (ATARAX/VISTARIL) 50 MG tablet Take 1 tablet (50 mg total) by mouth every 6 (six) hours as needed. 30 tablet 0   Loteprednol Etabonate (EYSUVIS OP) Apply to eye as needed.     Magnesium Citrate 100 MG TABS Take 200 mg by mouth daily.     meclizine (ANTIVERT) 25 MG tablet Take 1 tablet (25 mg total) by mouth 3 (three) times daily as needed for dizziness. 30 tablet 0   Melatonin 10 MG TABS Take 10 mg by mouth at bedtime.     metFORMIN (GLUCOPHAGE) 500 MG tablet Take 1 tablet (500 mg total) by mouth 2 (two) times daily  with a meal. 180 tablet 1   methocarbamol (ROBAXIN) 500 MG tablet Take 1 tablet (500 mg total) by mouth 2 (two) times daily as needed for muscle spasms. 60 tablet 1   nystatin ointment (MYCOSTATIN) Apply 1 application topically 2 (two) times daily. (Patient taking differently: Apply 1 application  topically as needed.) 30 g 0   Omega-3 Fatty Acids (FISH OIL PO) Take by mouth daily.     OVER THE COUNTER MEDICATION Seleium 200 mg     pantoprazole (PROTONIX) 40 MG tablet Take 1 tablet (40 mg total) by mouth 2 (two) times daily before a meal. 60 tablet 1   triamcinolone cream (KENALOG) 0.1 % Apply 1 Application topically as needed. 15 g 0   vitamin E 400 UNIT capsule Take 400 Units by mouth daily.     Azelastine HCl 137 MCG/SPRAY SOLN azelastine 137 mcg (0.1 %) nasal spray aerosol     Estradiol 10 MCG TABS vaginal tablet Place 1 tablet vaginally 2 (two) times a week.     fluticasone-salmeterol (ADVAIR DISKUS) 500-50 MCG/ACT AEPB Inhale 1 puff into the lungs in the morning and at bedtime. 60 each 3   TURMERIC CURCUMIN PO Take 1 tablet by mouth daily. (Patient not taking: Reported on 05/14/2022)     No facility-administered medications prior to visit.     Per HPI unless specifically indicated in ROS section below Review of Systems  Objective:  BP 116/68   Pulse 69   Temp 97.8 F (36.6 C) (Temporal)   Ht '5\' 10"'$  (1.778 m)   Wt 207 lb 2 oz (94 kg)   LMP 06/27/2019   SpO2 98%   BMI 29.72 kg/m   Wt Readings from Last 3 Encounters:  06/19/22 207 lb 2 oz (94 kg)  05/14/22 208 lb 8 oz (94.6 kg)  03/21/22 205 lb (93 kg)      Physical Exam Vitals and nursing note reviewed.  Constitutional:      Appearance: Normal appearance. She is not ill-appearing.  HENT:     Head: Normocephalic and atraumatic.     Mouth/Throat:     Mouth: Mucous membranes are moist.     Pharynx: Oropharynx is clear. No oropharyngeal exudate or posterior oropharyngeal erythema.  Eyes:     Extraocular Movements:  Extraocular movements intact.     Pupils: Pupils are equal, round, and reactive to light.  Neck:     Thyroid: No thyroid mass or thyromegaly.  Cardiovascular:     Rate and Rhythm: Normal rate and regular rhythm.     Pulses: Normal pulses.     Heart sounds: Normal heart sounds. No murmur heard. Pulmonary:     Effort: Pulmonary effort is normal. No respiratory distress.     Breath sounds: Normal breath sounds. No wheezing, rhonchi or rales.     Comments: No midline or costochondral  chest wall tenderness to palpation Chest:     Chest wall: No tenderness.  Musculoskeletal:     Cervical back: Normal range of motion and neck supple.     Right lower leg: No edema.     Left lower leg: No edema.  Skin:    General: Skin is warm and dry.     Findings: No rash.  Neurological:     Mental Status: She is alert.  Psychiatric:        Mood and Affect: Mood normal.        Behavior: Behavior normal.       Results for orders placed or performed in visit on 04/22/22  ECHOCARDIOGRAM COMPLETE  Result Value Ref Range   Area-P 1/2 2.80 cm2   S' Lateral 3.50 cm   Lab Results  Component Value Date   HGBA1C 6.0 06/04/2021    Assessment & Plan:   Problem List Items Addressed This Visit   None Visit Diagnoses     Need for influenza vaccination    -  Primary   Relevant Orders   Flu Vaccine QUAD 98moIM (Fluarix, Fluzone & Alfiuria Quad PF) (Completed)        No orders of the defined types were placed in this encounter.  Orders Placed This Encounter  Procedures   Flu Vaccine QUAD 642moM (Fluarix, Fluzone & Alfiuria Quad PF)     Patient Instructions  Flu shot today  Labs today.  Check A1c today.  Call and schedule mammogram with GYN.  Try to focus most gabapentin to evening dosing (maybe 1 in late afternoon and 1-2 at bedtime).  Ok to try xanax as needed for anxiety.  Return in 1 month for physical.   Follow up plan: No follow-ups on file.  JaRia BushMD

## 2022-06-20 ENCOUNTER — Encounter: Payer: Self-pay | Admitting: Family Medicine

## 2022-06-20 DIAGNOSIS — L509 Urticaria, unspecified: Secondary | ICD-10-CM | POA: Insufficient documentation

## 2022-06-20 LAB — IGE: IgE (Immunoglobulin E), Serum: 53 kU/L (ref <=114)

## 2022-06-20 NOTE — Assessment & Plan Note (Addendum)
Previously managed by PCP on armour thyroid '60mg'$ .  Recheck TFTs at upcoming CPE

## 2022-06-20 NOTE — Assessment & Plan Note (Addendum)
See above. Not consistent with MSK cause on exam today. S/p reassuring cardiac and pulmonary eval.  ?anxiety-related - trial hydroxyzine and xanax prn.

## 2022-06-20 NOTE — Assessment & Plan Note (Addendum)
Underwent reassuring cardiac and pulmonary workup which was reviewed with patient.  ?allergic component to dyspnea given worse when working in her yard, no difficulty when working out on exercise bike indoors.  Check CBC with diff, IgE and if abnormal recommend return to pulmonology.

## 2022-06-20 NOTE — Assessment & Plan Note (Signed)
Significant weight gain over the past few years since COVID and menopause. Update A1c today.

## 2022-06-20 NOTE — Assessment & Plan Note (Signed)
Overall stable period regarding mood after tapering off cymbalta. Will continue to monitor.

## 2022-06-20 NOTE — Assessment & Plan Note (Addendum)
Feet > hands.  Continue gabapentin '100mg'$  TID, discussed focusing dosing in evenings when symptoms more predominant.

## 2022-06-20 NOTE — Assessment & Plan Note (Signed)
Endorses h/o this, managed with PRN hydroxyzine '50mg'$  which is overly sedating.  Check IgE to see if may be candidate for allergy shots.

## 2022-06-20 NOTE — Assessment & Plan Note (Addendum)
She's been taking metformin '500mg'$  BID for prediabetes as highest A1c I can find is 6.0%, likely related to weight gain noted.  Update A1c today, this will guide further treatment recommendations.   A1c returned 6.4% - on metformin BID. Anticipate she has tipped into controlled diabetes range as she's on treatment. continue current metformin dose. Consider GLP1RA in future.

## 2022-06-20 NOTE — Assessment & Plan Note (Addendum)
Now off cymbalta.  Wonders if some of recent chest discomfort/shortness of breath may be driven by stress/anxiety. Discussed trying 1/2 tab hydroxyzine PRN (limited by sedation). Will also prescribe xanax to use sparingly PRN (previously tolerated well). She is aware this is a controlled substance, and habit forming nature of medication. She plans to use this sparingly.  [place] CSRS reviewed.

## 2022-06-20 NOTE — Assessment & Plan Note (Signed)
Followed by rheum Dr Estanislado Pandy.

## 2022-06-20 NOTE — Assessment & Plan Note (Signed)
H/o this, resolved on latest EGD 01/2022.  She is currently taking pantoprazole '40mg'$  daily.

## 2022-06-22 ENCOUNTER — Encounter: Payer: Self-pay | Admitting: Family Medicine

## 2022-06-22 DIAGNOSIS — F458 Other somatoform disorders: Secondary | ICD-10-CM

## 2022-06-24 DIAGNOSIS — F4323 Adjustment disorder with mixed anxiety and depressed mood: Secondary | ICD-10-CM | POA: Diagnosis not present

## 2022-07-04 ENCOUNTER — Other Ambulatory Visit: Payer: Self-pay | Admitting: Obstetrics and Gynecology

## 2022-07-04 DIAGNOSIS — N644 Mastodynia: Secondary | ICD-10-CM | POA: Diagnosis not present

## 2022-07-09 DIAGNOSIS — F4323 Adjustment disorder with mixed anxiety and depressed mood: Secondary | ICD-10-CM | POA: Diagnosis not present

## 2022-07-11 ENCOUNTER — Other Ambulatory Visit: Payer: Self-pay | Admitting: Family Medicine

## 2022-07-11 DIAGNOSIS — F458 Other somatoform disorders: Secondary | ICD-10-CM | POA: Insufficient documentation

## 2022-07-11 DIAGNOSIS — E538 Deficiency of other specified B group vitamins: Secondary | ICD-10-CM

## 2022-07-11 DIAGNOSIS — E038 Other specified hypothyroidism: Secondary | ICD-10-CM

## 2022-07-11 DIAGNOSIS — E559 Vitamin D deficiency, unspecified: Secondary | ICD-10-CM

## 2022-07-11 DIAGNOSIS — R7303 Prediabetes: Secondary | ICD-10-CM

## 2022-07-12 ENCOUNTER — Telehealth: Payer: Self-pay | Admitting: Family Medicine

## 2022-07-12 NOTE — Telephone Encounter (Signed)
Patient called and stated and asked if she ever been referred for a hearing test and if not can she be referred for one and she wanted to make sure it would be in [facility] coverage and wanted to know if it would be in the My Chart as well. Call back number [phone].

## 2022-07-12 NOTE — Telephone Encounter (Signed)
Please do hearing screen at her physical for hearing loss. Thanks.

## 2022-07-12 NOTE — Telephone Encounter (Signed)
Spoke with pt notifying her we will check her hearing at CPE.  Pt expresses her thanks.

## 2022-07-12 NOTE — Telephone Encounter (Signed)
Spoke to patient by telephone and was advised that she has had some hearing  loss recently. Patient is scheduled for a CPE 07/23/22 and wants to make sure that her hearing is checked at that visit. Patient stated that she saw an audiologist years ago and feels that she may need another referral for hearing loss.

## 2022-07-12 NOTE — Telephone Encounter (Signed)
Left message on voicemail for patient to call the office back. 

## 2022-07-15 ENCOUNTER — Other Ambulatory Visit (INDEPENDENT_AMBULATORY_CARE_PROVIDER_SITE_OTHER): Payer: BC Managed Care – PPO

## 2022-07-15 DIAGNOSIS — E538 Deficiency of other specified B group vitamins: Secondary | ICD-10-CM

## 2022-07-15 DIAGNOSIS — E063 Autoimmune thyroiditis: Secondary | ICD-10-CM

## 2022-07-15 DIAGNOSIS — E559 Vitamin D deficiency, unspecified: Secondary | ICD-10-CM

## 2022-07-15 DIAGNOSIS — E038 Other specified hypothyroidism: Secondary | ICD-10-CM | POA: Diagnosis not present

## 2022-07-15 DIAGNOSIS — R7303 Prediabetes: Secondary | ICD-10-CM | POA: Diagnosis not present

## 2022-07-15 LAB — BASIC METABOLIC PANEL
BUN: 25 mg/dL — ABNORMAL HIGH (ref 6–23)
CO2: 25 mEq/L (ref 19–32)
Calcium: 9.2 mg/dL (ref 8.4–10.5)
Chloride: 106 mEq/L (ref 96–112)
Creatinine, Ser: 0.94 mg/dL (ref 0.40–1.20)
GFR: 66.39 mL/min (ref >60.00)
Glucose, Bld: 93 mg/dL (ref 70–99)
Potassium: 4.3 mEq/L (ref 3.5–5.1)
Sodium: 140 mEq/L (ref 135–145)

## 2022-07-15 LAB — LIPID PANEL
Cholesterol: 183 mg/dL (ref 0–200)
HDL: 80 mg/dL (ref >39.00)
LDL Cholesterol: 89 mg/dL (ref 0–99)
NonHDL: 102.91
Total CHOL/HDL Ratio: 2
Triglycerides: 71 mg/dL (ref 0.0–149.0)
VLDL: 14.2 mg/dL (ref 0.0–40.0)

## 2022-07-15 LAB — T4, FREE: Free T4: 1 ng/dL (ref 0.60–1.60)

## 2022-07-15 LAB — VITAMIN B12: Vitamin B-12: 239 pg/mL (ref 211–911)

## 2022-07-15 LAB — TSH: TSH: 0.5 u[IU]/mL (ref 0.35–5.50)

## 2022-07-15 LAB — VITAMIN D 25 HYDROXY (VIT D DEFICIENCY, FRACTURES): VITD: 48.07 ng/mL (ref 30.00–100.00)

## 2022-07-16 LAB — T3: T3, Total: 129 ng/dL (ref 76–181)

## 2022-07-23 ENCOUNTER — Telehealth: Payer: Self-pay

## 2022-07-23 ENCOUNTER — Other Ambulatory Visit (HOSPITAL_COMMUNITY): Payer: Self-pay

## 2022-07-23 ENCOUNTER — Encounter: Payer: Self-pay | Admitting: Family Medicine

## 2022-07-23 ENCOUNTER — Ambulatory Visit (INDEPENDENT_AMBULATORY_CARE_PROVIDER_SITE_OTHER): Payer: BC Managed Care – PPO | Admitting: Family Medicine

## 2022-07-23 VITALS — BP 122/78 | HR 73 | Ht 70.0 in | Wt 208.0 lb

## 2022-07-23 DIAGNOSIS — E538 Deficiency of other specified B group vitamins: Secondary | ICD-10-CM

## 2022-07-23 DIAGNOSIS — E063 Autoimmune thyroiditis: Secondary | ICD-10-CM

## 2022-07-23 DIAGNOSIS — K219 Gastro-esophageal reflux disease without esophagitis: Secondary | ICD-10-CM

## 2022-07-23 DIAGNOSIS — M3509 Sicca syndrome with other organ involvement: Secondary | ICD-10-CM

## 2022-07-23 DIAGNOSIS — E1169 Type 2 diabetes mellitus with other specified complication: Secondary | ICD-10-CM

## 2022-07-23 DIAGNOSIS — G43801 Other migraine, not intractable, with status migrainosus: Secondary | ICD-10-CM

## 2022-07-23 DIAGNOSIS — Z23 Encounter for immunization: Secondary | ICD-10-CM

## 2022-07-23 DIAGNOSIS — E663 Overweight: Secondary | ICD-10-CM

## 2022-07-23 DIAGNOSIS — Z0000 Encounter for general adult medical examination without abnormal findings: Secondary | ICD-10-CM

## 2022-07-23 DIAGNOSIS — M797 Fibromyalgia: Secondary | ICD-10-CM

## 2022-07-23 DIAGNOSIS — E559 Vitamin D deficiency, unspecified: Secondary | ICD-10-CM

## 2022-07-23 DIAGNOSIS — G6289 Other specified polyneuropathies: Secondary | ICD-10-CM

## 2022-07-23 DIAGNOSIS — F325 Major depressive disorder, single episode, in full remission: Secondary | ICD-10-CM

## 2022-07-23 DIAGNOSIS — E038 Other specified hypothyroidism: Secondary | ICD-10-CM

## 2022-07-23 MED ORDER — VITAMIN D 50 MCG (2000 UT) PO CAPS: 1.0000 | ORAL_CAPSULE | Freq: Every day | ORAL | Status: DC

## 2022-07-23 MED ORDER — TRULICITY 0.75 MG/0.5ML ~~LOC~~ SOAJ: 0.7500 mg | SUBCUTANEOUS | 3 refills | Status: DC

## 2022-07-23 MED ORDER — CYANOCOBALAMIN 1000 MCG/ML IJ SOLN
1000.0000 ug | Freq: Once | INTRAMUSCULAR | Status: AC
  Administered 2022-07-23: 1000 ug via INTRAMUSCULAR

## 2022-07-23 NOTE — Progress Notes (Unsigned)
Patient ID: [name], female    DOB: [birth year], 61 y.o.   MRN: [id number]  This visit was conducted in person.  BP 122/78   Pulse 73   Ht '5\' 10"'$  (1.778 m)   Wt 208 lb (94.3 kg)   LMP 06/27/2019   SpO2 97%   BMI 29.84 kg/m   Hearing Screening   '500Hz'$  '1000Hz'$  '2000Hz'$  '4000Hz'$   Right ear '20 20 20 20  '$ Left ear '20 20 20 20   '$ CC: CPE Subjective:   HPI: [name] is a 61 y.o. female presenting on 07/23/2022 for Annual Exam (Gets pap at [facility])   Passes hearing screen today.  Peripheral neuropathy present since chronic migraines worsened ~2015 - notes ongoing leg pain. Gabapentin '100mg'$  TID not as effective as desired - having breakthrough symptoms on this regimen. Cymbalta helped but blunted emotions so she'd prefer to avoid this.   DM - stable on metformin '500mg'$  BID. No fmhx thyroid cancer.   Preventative: Colonoscopy 06/2013 - 1 benign polyp, rpt 10 yrs ([facility])  Well woman exam - through OBGYN Dr Julien Girt pending 08/2022 Breast cancer screening - pending dx mammo tomorrow (through GYN) DEXA scan 06/2020 through GYN - T -1.3 R femoral neck (osteopenia)  Lung cancer screen - not eligible  Flu shot - yearly COVID vaccine - did not receive Tdap 04/2016 Pneumonia shot - not due Shingrix - start today  Advanced directive discussion -  Seat belt use discussed Sunscreen use discussed. No changing moles on skin.  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 Interim medical  history since our last visit reviewed. Allergies and medications reviewed and updated. Outpatient Medications Prior to Visit  Medication Sig Dispense Refill   albuterol (VENTOLIN HFA) 108 (90 Base) MCG/ACT inhaler Inhale 2 puffs into the lungs every 4 (four) hours as needed for wheezing or shortness of breath. 18 g 4   ALPRAZolam (XANAX) 0.25 MG tablet Take 1 tablet (0.25 mg total) by mouth 2 (two) times daily as needed for anxiety. 30 tablet 0   ARMOUR THYROID 60 MG tablet TAKE 1 TABLET BY MOUTH DAILY BEFORE BREAKFAST 90 tablet 3   cetirizine (ZYRTEC) 10 MG tablet Take 10 mg by mouth at bedtime.     gabapentin (NEURONTIN) 100 MG capsule Take 1 capsule (100 mg total) by mouth 3 (three) times daily. 270 capsule 0   GAMMA AMINOBUTYRIC ACID PO Take 750 mg by mouth daily.     hydrOXYzine (ATARAX/VISTARIL) 50 MG tablet Take 1 tablet (50 mg total) by mouth every 6 (six) hours as needed. 30 tablet 0   Loteprednol Etabonate (EYSUVIS OP) Apply to eye as needed.     Magnesium Citrate 100 MG TABS Take 200 mg by mouth daily.     meclizine (ANTIVERT) 25 MG tablet Take 1 tablet (25 mg total) by mouth 3 (three) times daily as needed for dizziness. 30 tablet 0   Melatonin 10 MG TABS Take 10 mg by mouth at bedtime.  metFORMIN (GLUCOPHAGE) 500 MG tablet Take 1 tablet (500 mg total) by mouth 2 (two) times daily with a meal. 180 tablet 1   methocarbamol (ROBAXIN) 500 MG tablet Take 1 tablet (500 mg total) by mouth 2 (two) times daily as needed for muscle spasms. 60 tablet 1   nystatin ointment (MYCOSTATIN) Apply 1 application topically 2 (two) times daily. (Patient taking differently: Apply 1 application  topically as needed.) 30 g 0   Omega-3 Fatty Acids (FISH OIL PO) Take by mouth daily.     OVER THE COUNTER MEDICATION Seleium 200 mg     pantoprazole (PROTONIX) 40 MG tablet Take 1 tablet (40 mg total) by mouth 2 (two) times daily before a meal. 60 tablet 1   Perfluorohexyloctane (MIEBO) 1.338 GM/ML SOLN Apply  1 drop to eye 4 (four) times daily as needed (dry eyes).     triamcinolone cream (KENALOG) 0.1 % Apply 1 Application topically as needed. 15 g 0   vitamin E 400 UNIT capsule Take 400 Units by mouth daily.     Cholecalciferol (VITAMIN D PO) Take by mouth daily.     No facility-administered medications prior to visit.     Per HPI unless specifically indicated in ROS section below Review of Systems  Constitutional:  Positive for appetite change, fatigue and fever. Negative for activity change, chills and unexpected weight change.  HENT:  Positive for congestion.   Eyes:  Negative for visual disturbance.  Respiratory:  Positive for cough and shortness of breath (ongoing). Negative for chest tightness and wheezing.        Recent URI  Cardiovascular:  Positive for chest pain (ongoing). Negative for palpitations and leg swelling.  Gastrointestinal:  Positive for abdominal pain and constipation. Negative for abdominal distention, blood in stool, diarrhea, nausea and vomiting.  Genitourinary:  Negative for difficulty urinating and hematuria.  Musculoskeletal:  Negative for arthralgias, myalgias and neck pain.  Skin:  Negative for rash.  Neurological:  Positive for dizziness (BPV related) and headaches. Negative for seizures and syncope.  Hematological:  Negative for adenopathy. Does not bruise/bleed easily.  Psychiatric/Behavioral:  Negative for dysphoric mood. The patient is not nervous/anxious.     Objective:  BP 122/78   Pulse 73   Ht '5\' 10"'$  (1.778 m)   Wt 208 lb (94.3 kg)   LMP 06/27/2019   SpO2 97%   BMI 29.84 kg/m   Wt Readings from Last 3 Encounters:  07/23/22 208 lb (94.3 kg)  06/19/22 207 lb 2 oz (94 kg)  05/14/22 208 lb 8 oz (94.6 kg)      Physical Exam Vitals and nursing note reviewed.  Constitutional:      Appearance: Normal appearance. She is not ill-appearing.  HENT:     Head: Normocephalic and atraumatic.     Right Ear: Tympanic membrane, ear canal and external ear  normal. There is no impacted cerumen.     Left Ear: Tympanic membrane, ear canal and external ear normal. There is no impacted cerumen.     Nose: Nose normal.     Mouth/Throat:     Mouth: Mucous membranes are moist.     Pharynx: Oropharynx is clear. No oropharyngeal exudate or posterior oropharyngeal erythema.  Eyes:     General:        Right eye: No discharge.        Left eye: No discharge.     Extraocular Movements: Extraocular movements intact.     Conjunctiva/sclera: Conjunctivae normal.  Pupils: Pupils are equal, round, and reactive to light.  Neck:     Thyroid: No thyroid mass or thyromegaly.  Cardiovascular:     Rate and Rhythm: Normal rate and regular rhythm.     Pulses: Normal pulses.     Heart sounds: Normal heart sounds. No murmur heard. Pulmonary:     Effort: Pulmonary effort is normal. No respiratory distress.     Breath sounds: Normal breath sounds. No wheezing, rhonchi or rales.  Abdominal:     General: Bowel sounds are normal. There is no distension.     Palpations: Abdomen is soft. There is no mass.     Tenderness: There is no abdominal tenderness. There is no guarding or rebound.     Hernia: No hernia is present.  Musculoskeletal:     Cervical back: Normal range of motion and neck supple. No rigidity.     Right lower leg: No edema.     Left lower leg: No edema.  Lymphadenopathy:     Cervical: No cervical adenopathy.  Skin:    General: Skin is warm and dry.     Findings: No rash.  Neurological:     General: No focal deficit present.     Mental Status: She is alert. Mental status is at baseline.  Psychiatric:        Mood and Affect: Mood normal.        Behavior: Behavior normal.       Results for orders placed or performed in visit on 07/15/22  T4, free  Result Value Ref Range   Free T4 1.00 0.60 - 1.60 ng/dL  T3  Result Value Ref Range   T3, Total 129 76 - 181 ng/dL  Basic metabolic panel  Result Value Ref Range   Sodium 140 135 - 145 mEq/L    Potassium 4.3 3.5 - 5.1 mEq/L   Chloride 106 96 - 112 mEq/L   CO2 25 19 - 32 mEq/L   Glucose, Bld 93 70 - 99 mg/dL   BUN 25 (H) 6 - 23 mg/dL   Creatinine, Ser 0.94 0.40 - 1.20 mg/dL   GFR 66.39 >60.00 mL/min   Calcium 9.2 8.4 - 10.5 mg/dL  TSH  Result Value Ref Range   TSH 0.50 0.35 - 5.50 uIU/mL  Lipid panel  Result Value Ref Range   Cholesterol 183 0 - 200 mg/dL   Triglycerides 71.0 0.0 - 149.0 mg/dL   HDL 80.00 >39.00 mg/dL   VLDL 14.2 0.0 - 40.0 mg/dL   LDL Cholesterol 89 0 - 99 mg/dL   Total CHOL/HDL Ratio 2    NonHDL 102.91   VITAMIN D 25 Hydroxy (Vit-D Deficiency, Fractures)  Result Value Ref Range   VITD 48.07 30.00 - 100.00 ng/mL  Vitamin B12  Result Value Ref Range   Vitamin B-12 239 211 - 911 pg/mL   Lab Results  Component Value Date   HGBA1C 6.4 06/19/2022    Assessment & Plan:   Problem List Items Addressed This Visit   None    Meds ordered this encounter  Medications   Cholecalciferol (VITAMIN D) 50 MCG (2000 UT) CAPS    Sig: Take 1 capsule (2,000 Units total) by mouth daily.    Dispense:  30 capsule   No orders of the defined types were placed in this encounter.    Patient instructions: B12 shot today.  First shingles shot today. Schedule nurse visit in 2-6 months to complete series.  Price out trulicity 0.'75mg'$  weekly.  If needed increase gabapentin to 100/100/'200mg'$  at night.  Return in 2 months for diabetes follow up visit.   Follow up plan: Return if symptoms worsen or fail to improve.  Ria Bush, MD

## 2022-07-23 NOTE — Patient Instructions (Addendum)
B12 shot today.  First shingles shot today. Schedule nurse visit in 2-6 months to complete series.  Price out trulicity 0.'75mg'$  weekly.  If needed increase gabapentin to 100/100/'200mg'$  at night.  Return in 2 months for diabetes follow up visit.   Health Maintenance for Postmenopausal Women Menopause is a normal process in which your ability to get pregnant comes to an end. This process happens slowly over many months or years, usually between the ages of 12 and 75. Menopause is complete when you have missed your menstrual period for 12 months. It is important to talk with your health care provider about some of the most common conditions that affect women after menopause (postmenopausal women). These include heart disease, cancer, and bone loss (osteoporosis). Adopting a healthy lifestyle and getting preventive care can help to promote your health and wellness. The actions you take can also lower your chances of developing some of these common conditions. What are the signs and symptoms of menopause? During menopause, you may have the following symptoms: Hot flashes. These can be moderate or severe. Night sweats. Decrease in sex drive. Mood swings. Headaches. Tiredness (fatigue). Irritability. Memory problems. Problems falling asleep or staying asleep. Talk with your health care provider about treatment options for your symptoms. Do I need hormone replacement therapy? Hormone replacement therapy is effective in treating symptoms that are caused by menopause, such as hot flashes and night sweats. Hormone replacement carries certain risks, especially as you become older. If you are thinking about using estrogen or estrogen with progestin, discuss the benefits and risks with your health care provider. How can I reduce my risk for heart disease and stroke? The risk of heart disease, heart attack, and stroke increases as you age. One of the causes may be a change in the body's hormones during  menopause. This can affect how your body uses dietary fats, triglycerides, and cholesterol. Heart attack and stroke are medical emergencies. There are many things that you can do to help prevent heart disease and stroke. Watch your blood pressure High blood pressure causes heart disease and increases the risk of stroke. This is more likely to develop in people who have high blood pressure readings or are overweight. Have your blood pressure checked: Every 3-5 years if you are 81-74 years of age. Every year if you are 10 years old or older. Eat a healthy diet  Eat a diet that includes plenty of vegetables, fruits, low-fat dairy products, and lean protein. Do not eat a lot of foods that are high in solid fats, added sugars, or sodium. Get regular exercise Get regular exercise. This is one of the most important things you can do for your health. Most adults should: Try to exercise for at least 150 minutes each week. The exercise should increase your heart rate and make you sweat (moderate-intensity exercise). Try to do strengthening exercises at least twice each week. Do these in addition to the moderate-intensity exercise. Spend less time sitting. Even light physical activity can be beneficial. Other tips Work with your health care provider to achieve or maintain a healthy weight. Do not use any products that contain nicotine or tobacco. These products include cigarettes, chewing tobacco, and vaping devices, such as e-cigarettes. If you need help quitting, ask your health care provider. Know your numbers. Ask your health care provider to check your cholesterol and your blood sugar (glucose). Continue to have your blood tested as directed by your health care provider. Do I need screening for cancer? Depending  on your health history and family history, you may need to have cancer screenings at different stages of your life. This may include screening for: Breast cancer. Cervical cancer. Lung  cancer. Colorectal cancer. What is my risk for osteoporosis? After menopause, you may be at increased risk for osteoporosis. Osteoporosis is a condition in which bone destruction happens more quickly than new bone creation. To help prevent osteoporosis or the bone fractures that can happen because of osteoporosis, you may take the following actions: If you are 53-5 years old, get at least 1,000 mg of calcium and at least 600 international units (IU) of vitamin D per day. If you are older than age 3 but younger than age 65, get at least 1,200 mg of calcium and at least 600 international units (IU) of vitamin D per day. If you are older than age 9, get at least 1,200 mg of calcium and at least 800 international units (IU) of vitamin D per day. Smoking and drinking excessive alcohol increase the risk of osteoporosis. Eat foods that are rich in calcium and vitamin D, and do weight-bearing exercises several times each week as directed by your health care provider. How does menopause affect my mental health? Depression may occur at any age, but it is more common as you become older. Common symptoms of depression include: Feeling depressed. Changes in sleep patterns. Changes in appetite or eating patterns. Feeling an overall lack of motivation or enjoyment of activities that you previously enjoyed. Frequent crying spells. Talk with your health care provider if you think that you are experiencing any of these symptoms. General instructions See your health care provider for regular wellness exams and vaccines. This may include: Scheduling regular health, dental, and eye exams. Getting and maintaining your vaccines. These include: Influenza vaccine. Get this vaccine each year before the flu season begins. Pneumonia vaccine. Shingles vaccine. Tetanus, diphtheria, and pertussis (Tdap) booster vaccine. Your health care provider may also recommend other immunizations. Tell your health care provider if  you have ever been abused or do not feel safe at home. Summary Menopause is a normal process in which your ability to get pregnant comes to an end. This condition causes hot flashes, night sweats, decreased interest in sex, mood swings, headaches, or lack of sleep. Treatment for this condition may include hormone replacement therapy. Take actions to keep yourself healthy, including exercising regularly, eating a healthy diet, watching your weight, and checking your blood pressure and blood sugar levels. Get screened for cancer and depression. Make sure that you are up to date with all your vaccines. This information is not intended to replace advice given to you by your health care provider. Make sure you discuss any questions you have with your health care provider. Document Revised: 01/01/2021 Document Reviewed: 01/01/2021 Elsevier Patient Education  [place].

## 2022-07-23 NOTE — Telephone Encounter (Signed)
Pharmacy Patient Advocate Encounter   Received notification from [employer] that prior authorization for Trulicity 9.90/6.8JN is required/requested.    PA submitted on 07/23/22 to [employer] via CoverMyMeds  Key [id number]  Status is pending

## 2022-07-24 ENCOUNTER — Ambulatory Visit
Admission: RE | Admit: 2022-07-24 | Discharge: 2022-07-24 | Disposition: A | Discharge status: Home / Self Care | Payer: BC Managed Care – PPO | Source: Ambulatory Visit | Attending: Obstetrics and Gynecology | Admitting: Obstetrics and Gynecology

## 2022-07-24 DIAGNOSIS — N644 Mastodynia: Secondary | ICD-10-CM

## 2022-07-24 MED ORDER — GABAPENTIN 100 MG PO CAPS: 100.0000 mg | ORAL_CAPSULE | Freq: Three times a day (TID) | ORAL | 1 refills | Status: DC

## 2022-07-24 NOTE — Telephone Encounter (Signed)
Patient called in to follow up on this.

## 2022-07-24 NOTE — Telephone Encounter (Signed)
Pharmacy Patient Advocate Encounter  Received notification from [facility] that the request for prior authorization for Trulicity 0.'75mg'$ /0.28m has been denied due to .     This determination is currently being reviewed for appeal.    AJoneen Boers CBrooklynPatient Advocate Specialist CFabricaPatient Advocate Team Direct Number: [phone]: [phone]

## 2022-07-24 NOTE — Telephone Encounter (Signed)
From note below looks like the determination is currently being reviewed for appeal.   I think she should qualify for Trulicity as she's currently on metformin and will continue this, but want to add on trulicity for heart protective effect.   She is also at increased risk of atherosclerotic cardiovascular disease and hyperlipidemia due to her history of Sjogren's disease.

## 2022-07-25 ENCOUNTER — Other Ambulatory Visit (HOSPITAL_COMMUNITY): Payer: Self-pay

## 2022-07-25 MED ORDER — ARMOUR THYROID 60 MG PO TABS: 60.0000 mg | ORAL_TABLET | Freq: Every day | ORAL | 3 refills | Status: DC

## 2022-07-25 MED ORDER — VITAMIN B-12 1000 MCG PO TABS: 1000.0000 ug | ORAL_TABLET | Freq: Every day | ORAL | Status: DC

## 2022-07-25 MED ORDER — PANTOPRAZOLE SODIUM 40 MG PO TBEC: 40.0000 mg | DELAYED_RELEASE_TABLET | Freq: Two times a day (BID) | ORAL | 6 refills | Status: DC

## 2022-07-25 NOTE — Addendum Note (Signed)
Addended by: Ria Bush on: 07/25/2022 12:50 PM   Modules accepted: Orders

## 2022-07-25 NOTE — Assessment & Plan Note (Signed)
Levels low normal on latest check, she is on metformin.  Start b12 1086mg daily. Will provide B12 shot today as well.

## 2022-07-25 NOTE — Assessment & Plan Note (Signed)
Stable period off cymbalta.

## 2022-07-25 NOTE — Telephone Encounter (Signed)
Resubmitted prior auth with more information Pharmacy Patient Advocate Encounter  Prior Authorization for TRULICITY has been approved.    PA# [id number] Effective dates: 07/25/2022 through 07/24/2023    Patient Advocate Encounter   Received notification from [facility] that prior authorization for Trulicity 0.'75mg'$ /0.18m is required.   PA submitted on 07/25/2022 Key [id number] is pending       AJoneen Boers CChamberinoPatient Advocate Specialist CGilmanPatient Advocate Team Direct Number: [phone]: [phone]

## 2022-07-25 NOTE — Assessment & Plan Note (Addendum)
Latest A1c 6.4% on metformin '500mg'$  BID.  Overall controlled on this regimen however notes ongoing difficulty losing weight. Discussed GLP1RA option - will price out trulicity 0.'75mg'$  weekly.  Reviewed mechanism of action of medication as well as possible side effects and adverse events to monitor for. Reviewed cardiovascular protection benefit. No fmhx thyroid cancer.  RTC 2 mo DM f/u visit.

## 2022-07-25 NOTE — Assessment & Plan Note (Addendum)
Cymbalta was beneficial but blunted mood so now off this. Gabapentin '100mg'$  TID not controlling symptoms - will slowly titrate by '100mg'$  at a time - up to '300mg'$  TID if needed, monitoring for sedation.

## 2022-07-25 NOTE — Assessment & Plan Note (Addendum)
This is followed by rheum.

## 2022-07-25 NOTE — Assessment & Plan Note (Signed)
Preventative protocols reviewed and updated unless pt declined. Discussed healthy diet and lifestyle.  

## 2022-07-25 NOTE — Assessment & Plan Note (Signed)
Continue vitamin-D 2000 IU daily.

## 2022-07-25 NOTE — Assessment & Plan Note (Signed)
Stable period recently without frequent migraines.

## 2022-07-25 NOTE — Assessment & Plan Note (Signed)
Chronic, TFTs stable on armour thyroid '60mg'$  daily.

## 2022-08-14 DIAGNOSIS — M1711 Unilateral primary osteoarthritis, right knee: Secondary | ICD-10-CM | POA: Diagnosis not present

## 2022-08-14 DIAGNOSIS — M7062 Trochanteric bursitis, left hip: Secondary | ICD-10-CM | POA: Diagnosis not present

## 2022-08-14 DIAGNOSIS — M1712 Unilateral primary osteoarthritis, left knee: Secondary | ICD-10-CM | POA: Diagnosis not present

## 2022-08-14 DIAGNOSIS — M170 Bilateral primary osteoarthritis of knee: Secondary | ICD-10-CM | POA: Diagnosis not present

## 2022-08-27 ENCOUNTER — Encounter: Payer: Self-pay | Admitting: Family Medicine

## 2022-08-27 ENCOUNTER — Telehealth: Payer: Self-pay | Admitting: Family Medicine

## 2022-08-27 NOTE — Telephone Encounter (Signed)
Pt called asking Dr. Darnell Level could send a prior authorization to her insurance company for the meds, Trulicity? She changed insurance companies from [employer] to [employer] yesterday, 08/26/22.

## 2022-08-28 NOTE — Telephone Encounter (Signed)
Please see MyChart message dated 08/27/21.

## 2022-08-28 NOTE — Telephone Encounter (Signed)
Message sent to Prior Auth team for review.

## 2022-08-28 NOTE — Telephone Encounter (Signed)
Per MyChart message: I called back to [place] and  they checked their system. They confirmed this was sent to your office yesterday. They even printed out verification that the prior authorization form was sent.  They also said it's not necessary for you to actually have their form, that you can use your own form. but they did go ahead and send you another form  just now.   So if someone can please make sure this is submitted to [facility] as soon as possible.  Thank you so much. Happy New Year! [name]  Routing to Rx PA pool for review.

## 2022-08-29 ENCOUNTER — Other Ambulatory Visit (HOSPITAL_COMMUNITY): Payer: Self-pay

## 2022-08-29 NOTE — Telephone Encounter (Signed)
Patient called and stated she would like a call back about the Truilicity from Dr. Darnell Level nurse. Call back number [phone].

## 2022-08-29 NOTE — Telephone Encounter (Signed)
Patient Advocate Encounter   Received notification from [employer] that prior authorization for Trulicity 0.'75MG'$ /0.5ML is required.   PA submitted on 08/29/2022 Key: [id number] Status is pending

## 2022-08-29 NOTE — Telephone Encounter (Signed)
Per 08/27/22 phn note, PA was submitted under pt's new ins co. Decision pending.  Spoke with pt notifying her of above info. Pt verbalizes understanding and expresses her thanks.

## 2022-09-06 NOTE — Telephone Encounter (Addendum)
PA submitted 1/4. Pt is asking about status of PA. Can we check on this? Thanks

## 2022-09-09 NOTE — Telephone Encounter (Signed)
Noted. Thank you.

## 2022-09-09 NOTE — Telephone Encounter (Signed)
Pharmacy Patient Advocate Encounter  Received notification from [employer] [employer] that the request for prior authorization for Trulicity has been denied due to .    Please be advised we currently do not have a Pharmacist to review denials. If you would like [place] to submit it on your behalf, please provide clinical information to support your reason for appeal and any pertinent information you would like [place] to include with the appeal request. Appeals may take longer 5 business days to be submitted as we prepares necessary documentation. Thanks for your support.  How would you like to proceed?

## 2022-09-09 NOTE — Telephone Encounter (Signed)
PA was denied due to pt not having tried and failed metformin but it is included in her chart that was sent to insurance that she has and is on metformin. Resubmitting PA, new key: [id number], will continue to check throughout the day to avoid any further delay in patient care.

## 2022-09-10 ENCOUNTER — Encounter: Payer: Self-pay | Admitting: Family Medicine

## 2022-09-10 ENCOUNTER — Encounter: Payer: Self-pay | Admitting: Pulmonary Disease

## 2022-09-11 NOTE — Telephone Encounter (Signed)
OK to remove albuterol - thanks

## 2022-09-23 ENCOUNTER — Telehealth: Payer: Self-pay | Admitting: Cardiovascular Disease

## 2022-09-23 DIAGNOSIS — M170 Bilateral primary osteoarthritis of knee: Secondary | ICD-10-CM | POA: Diagnosis not present

## 2022-09-23 NOTE — Telephone Encounter (Signed)
Left voice message asking patient to give office a call back for tele visit for pre-op clearance. Will try calling again.

## 2022-09-23 NOTE — Telephone Encounter (Signed)
   Pre-operative Risk Assessment    Patient Name: [name]  DOB: 1960-10-09 MRN: [id number]      Request for Surgical Clearance    Procedure:   right knee patellofemoral replacement   Date of Surgery:  Clearance TBD                                 Surgeon:  Dr Monico Blitz. [place] [facility] Group or Practice Name:  [facility], operational number:  [phone] Fax number:  [phone]   Type of Clearance Requested:   - Medical    Type of Anesthesia:  Spinal   Additional requests/questions:    SignedAce Gins   09/23/2022, 2:53 PM

## 2022-09-23 NOTE — Telephone Encounter (Signed)
   Name: [name]  DOB: 06-01-1961  MRN: [id number]  Primary Cardiologist: None   Preoperative team, please contact this patient and set up a phone call appointment for further preoperative risk assessment. Please obtain consent and complete medication review. Thank you for your help.  I confirm that guidance regarding antiplatelet and oral anticoagulation therapy has been completed and, if necessary, noted below.   Trudi Ida, NP 09/23/2022, 3:05 PM [place]

## 2022-09-24 ENCOUNTER — Telehealth: Payer: Self-pay | Admitting: *Deleted

## 2022-09-24 NOTE — Telephone Encounter (Signed)
PRIMARY CARD IS DR. Rockey Situ.  Pt agreeable to tele pre op appt 10/01/22 @ 10 am . Med rec and consent are done.

## 2022-09-24 NOTE — Telephone Encounter (Signed)
Patient is returning your call. Please advise. ?

## 2022-09-24 NOTE — Telephone Encounter (Signed)
Pt agreeable to tele pre op appt 10/01/22 @ 10 am . Med rec and consent are done.     Patient Consent for Virtual Visit        [name] has provided verbal consent on 09/24/2022 for a virtual visit (video or telephone).   CONSENT FOR VIRTUAL VISIT FOR:  [name]  By participating in this virtual visit I agree to the following:  I hereby voluntarily request, consent and authorize [facility] and its employed or contracted physicians, physician assistants, nurse practitioners or other licensed health care professionals (the Practitioner), to provide me with telemedicine health care services (the "Services") as deemed necessary by the treating Practitioner. I acknowledge and consent to receive the Services by the Practitioner via telemedicine. I understand that the telemedicine visit will involve communicating with the Practitioner through live audiovisual communication technology and the disclosure of certain medical information by electronic transmission. I acknowledge that I have been given the opportunity to request an in-person assessment or other available alternative prior to the telemedicine visit and am voluntarily participating in the telemedicine visit.  I understand that I have the right to withhold or withdraw my consent to the use of telemedicine in the course of my care at any time, without affecting my right to future care or treatment, and that the Practitioner or I may terminate the telemedicine visit at any time. I understand that I have the right to inspect all information obtained and/or recorded in the course of the telemedicine visit and may receive copies of available information for a reasonable fee.  I understand that some of the potential risks of receiving the Services via telemedicine include:  Delay or interruption in medical evaluation due to technological equipment failure or disruption; Information transmitted may not be sufficient (e.g. poor  resolution of images) to allow for appropriate medical decision making by the Practitioner; and/or  In rare instances, security protocols could fail, causing a breach of personal health information.  Furthermore, I acknowledge that it is my responsibility to provide information about my medical history, conditions and care that is complete and accurate to the best of my ability. I acknowledge that Practitioner's advice, recommendations, and/or decision may be based on factors not within their control, such as incomplete or inaccurate data provided by me or distortions of diagnostic images or specimens that may result from electronic transmissions. I understand that the practice of medicine is not an exact science and that Practitioner makes no warranties or guarantees regarding treatment outcomes. I acknowledge that a copy of this consent can be made available to me via my patient portal ([facility]), or I can request a printed copy by calling the office of [facility].    I understand that my insurance will be billed for this visit.   I have read or had this consent read to me. I understand the contents of this consent, which adequately explains the benefits and risks of the Services being provided via telemedicine.  I have been provided ample opportunity to ask questions regarding this consent and the Services and have had my questions answered to my satisfaction. I give my informed consent for the services to be provided through the use of telemedicine in my medical care

## 2022-09-25 ENCOUNTER — Telehealth: Payer: Self-pay

## 2022-09-25 NOTE — Telephone Encounter (Signed)
Received faxed surgical clearance form from Marlin Canary, of [facility]. Pt needs R knee patellofemoral replacement. Surgery date TBA.   Lvm asking pt to call back. Needs to schedule pre-op OV.  [Form is in basket on [employer].]

## 2022-09-26 ENCOUNTER — Other Ambulatory Visit: Payer: Self-pay

## 2022-09-26 DIAGNOSIS — R69 Illness, unspecified: Secondary | ICD-10-CM | POA: Diagnosis not present

## 2022-09-26 DIAGNOSIS — R7303 Prediabetes: Secondary | ICD-10-CM

## 2022-09-26 DIAGNOSIS — Z01419 Encounter for gynecological examination (general) (routine) without abnormal findings: Secondary | ICD-10-CM | POA: Diagnosis not present

## 2022-09-26 DIAGNOSIS — Z13820 Encounter for screening for osteoporosis: Secondary | ICD-10-CM | POA: Diagnosis not present

## 2022-09-26 DIAGNOSIS — Z124 Encounter for screening for malignant neoplasm of cervix: Secondary | ICD-10-CM | POA: Diagnosis not present

## 2022-09-26 DIAGNOSIS — Z6828 Body mass index (BMI) 28.0-28.9, adult: Secondary | ICD-10-CM | POA: Diagnosis not present

## 2022-09-26 MED ORDER — METFORMIN HCL 500 MG PO TABS: 500.0000 mg | ORAL_TABLET | Freq: Two times a day (BID) | ORAL | 3 refills | Status: DC

## 2022-09-26 NOTE — Telephone Encounter (Addendum)
Looks like pt cb and scheduled pre-op OV on 10/08/22 at 9:30.  [Form is in basket on [employer].]

## 2022-09-26 NOTE — Telephone Encounter (Signed)
E-scribed refill 

## 2022-09-27 ENCOUNTER — Telehealth: Payer: Self-pay

## 2022-09-27 NOTE — Telephone Encounter (Signed)
Fax received from Dr. Melrose Nakayama with [facility] to perform a RIGHT KNEE PATELLA FEMORAL REPLACEMENT on patient.  Patient needs surgery clearance. Surgery is PENDING. Patient was seen on 03/21/2022. Office protocol is a risk assessment can be sent to surgeon if patient has been seen in 60 days or less.   Sending to Dr Silas Flood for risk assessment or recommendations if patient needs to be seen in office prior to surgical procedure.    Patient called office back and made surgical clearance appointment for 10/10/2022.

## 2022-09-27 NOTE — Telephone Encounter (Signed)
Called and left patient and voicemail for her to call the office back when she has a moment to schedule follow up from last year along with surgical clearance for her right knee surgery

## 2022-09-30 ENCOUNTER — Encounter: Payer: Self-pay | Admitting: Family Medicine

## 2022-10-01 ENCOUNTER — Ambulatory Visit: Payer: 59 | Attending: Internal Medicine | Admitting: Nurse Practitioner

## 2022-10-01 DIAGNOSIS — Z01810 Encounter for preprocedural cardiovascular examination: Secondary | ICD-10-CM | POA: Diagnosis not present

## 2022-10-01 NOTE — Progress Notes (Signed)
Virtual Visit via Telephone Note   Because of [name]'s co-morbid illnesses, she is at least at moderate risk for complications without adequate follow up.  This format is felt to be most appropriate for this patient at this time.  The patient did not have access to video technology/had technical difficulties with video requiring transitioning to audio format only (telephone).  All issues noted in this document were discussed and addressed.  No physical exam could be performed with this format.  Please refer to the patient's chart for her consent to telehealth for [facility].  Evaluation Performed:  Preoperative cardiovascular risk assessment _____________   Date:  10/01/2022   Patient ID:  [name], [name] June 09, 1961, MRN [id number] Patient Location:  Home Provider location:   Office  Primary Care Provider:  Ria Bush, MD Primary Cardiologist:  Ida Rogue, MD  Chief Complaint / Patient Profile   62 y.o. y/o female with a h/o tachycardia, chronic shortness of breath, chronic migraines, Hashimoto's, Sjogren's syndrome, and chronic fatigue who is pending R knee patellofemoral replacement with Dr. Monico Blitz. [facility] of [facility] and presents today for telephonic preoperative cardiovascular risk assessment.  History of Present Illness    [name] is a 62 y.o. female who presents via audio/video conferencing for a telehealth visit today.  Pt was last seen in cardiology clinic on 05/14/2022 by Dr. Rockey Situ.  At that time Oneal Biglow was doing well. Did report ongoing fatigue, dyspnea on exertion. Cardiopulmonary exercise test showed excellent functional capacity, overall normal.  The patient is now pending procedure as outlined above. Since her last visit, she has done well from a cardiac standpoint.   She denies chest pain, palpitations, dyspnea, pnd, orthopnea, n, v, dizziness, syncope, edema, weight gain, or early satiety. All  other systems reviewed and are otherwise negative except as noted above.   Past Medical History    Past Medical History:  Diagnosis Date   Allergic rhinitis    Anxiety    Cervical dysplasia 1990   s/p conization   Chronic sinusitis    Depression    Fibromyalgia    Hashimoto's disease    Hiatal hernia    Hip fracture, left ([place]) 2005   s/p ORIF   Hypothyroidism    Migraine    Neuropathy    Schatzki's ring    Sjogren syndrome, unspecified ([place])    Past Surgical History:  Procedure Laterality Date   BREAST ENHANCEMENT SURGERY  2007   BREAST IMPLANT REMOVAL Bilateral 05/21/2018   DILATION AND CURETTAGE OF UTERUS  1989   ORIF HIP FRACTURE  2005   [facility]   TONSILLECTOMY AND ADENOIDECTOMY     TOTAL HIP ARTHROPLASTY Left 2005   second surgery     Allergies  No Known Allergies  Home Medications    Prior to Admission medications   Medication Sig Start Date End Date Taking? Authorizing Provider  ALPRAZolam (XANAX) 0.25 MG tablet Take 1 tablet (0.25 mg total) by mouth 2 (two) times daily as needed for anxiety. 06/19/22   Ria Bush, MD  ARMOUR THYROID 60 MG tablet Take 1 tablet (60 mg total) by mouth daily before breakfast. 07/25/22   Ria Bush, MD  cetirizine (ZYRTEC) 10 MG tablet Take 10 mg by mouth at bedtime.    [provider]  Cholecalciferol (VITAMIN D) 50 MCG (2000 UT) CAPS Take 1 capsule (2,000 Units total) by mouth daily. 07/23/22   Ria Bush, MD  cyanocobalamin (VITAMIN B12)  1000 MCG tablet Take 1 tablet (1,000 mcg total) by mouth daily. 07/25/22   Ria Bush, MD  Dulaglutide (TRULICITY) 0.27 XA/1.2IN SOPN Inject 0.75 mg into the skin once a week. 07/23/22   Ria Bush, MD  gabapentin (NEURONTIN) 100 MG capsule Take 1-2 capsules (100-200 mg total) by mouth 3 (three) times daily. 07/24/22   Ria Bush, MD  GAMMA AMINOBUTYRIC ACID PO Take 750 mg by mouth daily.    [provider]  Loteprednol Etabonate  (EYSUVIS OP) Apply to eye as needed.    [provider]  Magnesium Citrate 100 MG TABS Take 200 mg by mouth daily.    [provider]  Melatonin 10 MG TABS Take 10 mg by mouth at bedtime.    [provider]  metFORMIN (GLUCOPHAGE) 500 MG tablet Take 1 tablet (500 mg total) by mouth 2 (two) times daily with a meal. 09/26/22   Ria Bush, MD  methocarbamol (ROBAXIN) 500 MG tablet Take 1 tablet (500 mg total) by mouth 2 (two) times daily as needed for muscle spasms. 06/04/21   Waunita Schooner, MD  nystatin ointment (MYCOSTATIN) Apply 1 application topically 2 (two) times daily. Patient not taking: Reported on 09/24/2022 06/04/21   Waunita Schooner, MD  Omega-3 Fatty Acids (FISH OIL PO) Take by mouth daily.    [provider]  OVER THE COUNTER MEDICATION Seleium 200 mg    [provider]  pantoprazole (PROTONIX) 40 MG tablet Take 1 tablet (40 mg total) by mouth 2 (two) times daily before a meal. 07/25/22   Ria Bush, MD  Perfluorohexyloctane (MIEBO) 1.338 GM/ML SOLN Apply 1 drop to eye 4 (four) times daily as needed (dry eyes). 07/23/22   Ria Bush, MD  triamcinolone cream (KENALOG) 0.1 % Apply 1 Application topically as needed. 05/07/22   Waunita Schooner, MD  vitamin E 400 UNIT capsule Take 400 Units by mouth daily.    [provider]    Physical Exam    Vital Signs:  Rowe Robert does not have vital signs available for review today.  Given telephonic nature of communication, physical exam is limited. AAOx3. NAD. Normal affect.  Speech and respirations are unlabored.  Accessory Clinical Findings    None  Assessment & Plan    1.  Preoperative Cardiovascular Risk Assessment:  According to the Revised Cardiac Risk Index (RCRI), her Perioperative Risk of Major Cardiac Event is (%): 0.4. Her Functional Capacity in METs is: 7.99 according to the Duke Activity Status Index (DASI).Therefore, based on ACC/AHA guidelines,  patient would be at acceptable risk for the planned procedure without further cardiovascular testing.   The patient was advised that if she develops new symptoms prior to surgery to contact our office to arrange for a follow-up visit, and she verbalized understanding.  A copy of this note will be routed to requesting surgeon.  Time:   Today, I have spent 8 minutes with the patient with telehealth technology discussing medical history, symptoms, and management plan.    Lenna Sciara, NP  10/01/2022, 10:14 AM

## 2022-10-08 ENCOUNTER — Ambulatory Visit: Payer: 59 | Admitting: Family Medicine

## 2022-10-08 ENCOUNTER — Encounter: Payer: Self-pay | Admitting: Family Medicine

## 2022-10-08 ENCOUNTER — Ambulatory Visit (INDEPENDENT_AMBULATORY_CARE_PROVIDER_SITE_OTHER): Payer: 59 | Admitting: Family Medicine

## 2022-10-08 VITALS — BP 118/68 | HR 83 | Temp 97.1°F | Ht 70.0 in | Wt 197.1 lb

## 2022-10-08 DIAGNOSIS — Z23 Encounter for immunization: Secondary | ICD-10-CM

## 2022-10-08 DIAGNOSIS — K219 Gastro-esophageal reflux disease without esophagitis: Secondary | ICD-10-CM

## 2022-10-08 DIAGNOSIS — E538 Deficiency of other specified B group vitamins: Secondary | ICD-10-CM

## 2022-10-08 DIAGNOSIS — E611 Iron deficiency: Secondary | ICD-10-CM

## 2022-10-08 DIAGNOSIS — E1169 Type 2 diabetes mellitus with other specified complication: Secondary | ICD-10-CM | POA: Diagnosis not present

## 2022-10-08 DIAGNOSIS — G6289 Other specified polyneuropathies: Secondary | ICD-10-CM

## 2022-10-08 DIAGNOSIS — R790 Abnormal level of blood mineral: Secondary | ICD-10-CM | POA: Diagnosis not present

## 2022-10-08 DIAGNOSIS — M3509 Sicca syndrome with other organ involvement: Secondary | ICD-10-CM

## 2022-10-08 DIAGNOSIS — Z01818 Encounter for other preprocedural examination: Secondary | ICD-10-CM | POA: Insufficient documentation

## 2022-10-08 DIAGNOSIS — M797 Fibromyalgia: Secondary | ICD-10-CM

## 2022-10-08 LAB — POCT GLYCOSYLATED HEMOGLOBIN (HGB A1C): Hemoglobin A1C: 5.4 % (ref 4.0–5.6)

## 2022-10-08 LAB — COMPREHENSIVE METABOLIC PANEL
ALT: 13 U/L (ref 0–35)
AST: 18 U/L (ref 0–37)
Albumin: 4.4 g/dL (ref 3.5–5.2)
Alkaline Phosphatase: 68 U/L (ref 39–117)
BUN: 24 mg/dL — ABNORMAL HIGH (ref 6–23)
CO2: 27 mEq/L (ref 19–32)
Calcium: 10.4 mg/dL (ref 8.4–10.5)
Chloride: 102 mEq/L (ref 96–112)
Creatinine, Ser: 0.99 mg/dL (ref 0.40–1.20)
GFR: 62.28 mL/min (ref >60.00)
Glucose, Bld: 82 mg/dL (ref 70–99)
Potassium: 4.7 mEq/L (ref 3.5–5.1)
Sodium: 137 mEq/L (ref 135–145)
Total Bilirubin: 0.4 mg/dL (ref 0.2–1.2)
Total Protein: 7.2 g/dL (ref 6.0–8.3)

## 2022-10-08 LAB — CBC WITH DIFFERENTIAL/PLATELET
Basophils Absolute: 0.1 10*3/uL (ref 0.0–0.1)
Basophils Relative: 1.2 % (ref 0.0–3.0)
Eosinophils Absolute: 0.3 10*3/uL (ref 0.0–0.7)
Eosinophils Relative: 4.7 % (ref 0.0–5.0)
HCT: 39.7 % (ref 36.0–46.0)
Hemoglobin: 13.3 g/dL (ref 12.0–15.0)
Lymphocytes Relative: 37.7 % (ref 12.0–46.0)
Lymphs Abs: 2 10*3/uL (ref 0.7–4.0)
MCHC: 33.5 g/dL (ref 30.0–36.0)
MCV: 91.8 fl (ref 78.0–100.0)
Monocytes Absolute: 0.4 10*3/uL (ref 0.1–1.0)
Monocytes Relative: 7.7 % (ref 3.0–12.0)
Neutro Abs: 2.6 10*3/uL (ref 1.4–7.7)
Neutrophils Relative %: 48.7 % (ref 43.0–77.0)
Platelets: 346 10*3/uL (ref 150.0–400.0)
RBC: 4.32 Mil/uL (ref 3.87–5.11)
RDW: 15 % (ref 11.5–15.5)
WBC: 5.4 10*3/uL (ref 4.0–10.5)

## 2022-10-08 LAB — IBC PANEL
Iron: 85 ug/dL (ref 42–145)
Saturation Ratios: 26.2 % (ref 20.0–50.0)
TIBC: 324.8 ug/dL (ref 250.0–450.0)
Transferrin: 232 mg/dL (ref 212.0–360.0)

## 2022-10-08 LAB — VITAMIN B12: Vitamin B-12: 503 pg/mL (ref 211–911)

## 2022-10-08 LAB — FERRITIN: Ferritin: 16.7 ng/mL (ref 10.0–291.0)

## 2022-10-08 MED ORDER — PANTOPRAZOLE SODIUM 40 MG PO TBEC: 40.0000 mg | DELAYED_RELEASE_TABLET | Freq: Two times a day (BID) | ORAL | 4 refills | Status: DC

## 2022-10-08 MED ORDER — METFORMIN HCL 500 MG PO TABS: 500.0000 mg | ORAL_TABLET | Freq: Every day | ORAL | 4 refills | Status: DC

## 2022-10-08 MED ORDER — CYANOCOBALAMIN 1000 MCG/ML IJ SOLN
1000.0000 ug | Freq: Once | INTRAMUSCULAR | Status: AC
  Administered 2022-10-08: 1000 ug via INTRAMUSCULAR

## 2022-10-08 MED ORDER — TRULICITY 1.5 MG/0.5ML ~~LOC~~ SOAJ: 1.5000 mg | SUBCUTANEOUS | 11 refills | Status: DC

## 2022-10-08 MED ORDER — ARMOUR THYROID 60 MG PO TABS: 60.0000 mg | ORAL_TABLET | Freq: Every day | ORAL | 4 refills | Status: DC

## 2022-10-08 NOTE — Progress Notes (Unsigned)
Patient ID: [name], female    DOB: [birth year], 62 y.o.   MRN: [id number]  This visit was conducted in person.  BP 118/68   Pulse 83   Temp (!) 97.1 F (36.2 C) (Temporal)   Ht 5' 10"$  (1.778 m)   Wt 197 lb 2 oz (89.4 kg)   LMP 06/27/2019   SpO2 98%   BMI 28.28 kg/m    CC: DM f/u visit, preop evaluation - postponed Subjective:   HPI: [name] is a 62 y.o. female presenting on 10/08/2022 for Medical Management of Chronic Issues (Here for DM f/u.)   She decided to postpone surgery until May.   Known Sjogren's - sees rheum Dr Estanislado Pandy. Prefers to avoid medication.   Peripheral neuropathy - present since 2010s - she describes numbness and tingling and burning pain to bilateral feet, occasionally to hands. She notes gabapentin 122m TID is not as effective. Cymbalta caused apathy. H/o low B12 levels, she last received b12 shot 06/2022. She's started MVI in place of B12 trying to consolidate meds.   DM - does not regularly check sugars. Compliant with antihyperglycemic regimen which includes: metformin 5046mbid, newly started trulicity 0.XX123456eekly. She lost 10 lbs on this medication and tolerated well without nausea, diarrhea, constipation, epigastric pain. Denies low sugars or hypoglycemic symptoms. Denies blurry vision. +paresthesias associated with neuropathy. Last diabetic eye exam DUE. Glucometer brand: doesn't have at home. Last foot exam: DUE. DSME: .  Lab Results  Component Value Date   HGBA1C 5.4 10/08/2022   Diabetic Foot Exam - Simple   No data filed    No results found for: "MICROALBUR", "[id number]"    Planned upcoming R partial patellofemoral knee replacement surgery by Dr DaRhona Raider at May.  She received cardiac clearance on 10/01/2022 (see telephone encounter).  She was scheduled to see pulmonology for pulmonary clearance on 10/10/2022 - will reschedule to end of March.      Relevant past medical, surgical, family and social history  reviewed and updated as indicated. Interim medical history since our last visit reviewed. Allergies and medications reviewed and updated. Outpatient Medications Prior to Visit  Medication Sig Dispense Refill   ALPRAZolam (XANAX) 0.25 MG tablet Take 1 tablet (0.25 mg total) by mouth 2 (two) times daily as needed for anxiety. 30 tablet 0   CALCIUM-MAGNESIUM-ZINC PO Take by mouth daily.     cetirizine (ZYRTEC) 10 MG tablet Take 10 mg by mouth at bedtime.     Dulaglutide (TRULICITY) 0.A999333G0000000OPN Inject 0.75 mg into the skin once a week. 2 mL 3   gabapentin (NEURONTIN) 100 MG capsule Take 1-2 capsules (100-200 mg total) by mouth 3 (three) times daily. 540 capsule 1   Loteprednol Etabonate (EYSUVIS OP) Apply to eye as needed.     Melatonin 10 MG TABS Take 10 mg by mouth at bedtime.     methocarbamol (ROBAXIN) 500 MG tablet Take 1 tablet (500 mg total) by mouth 2 (two) times daily as needed for muscle spasms. 60 tablet 1   Multiple Vitamins-Minerals (CENTRUM SILVER 50+WOMEN PO) Take 1 tablet by mouth daily.     nystatin ointment (MYCOSTATIN) Apply 1 application topically 2 (two) times daily. 30 g 0   Omega-3 Fatty Acids (FISH OIL PO) Take by mouth daily.     Perfluorohexyloctane (MIEBO) 1.338 GM/ML SOLN Apply 1 drop to eye 4 (four) times daily as needed (dry eyes).     triamcinolone cream (KENALOG) 0.1 % Apply  1 Application topically as needed. 15 g 0   ARMOUR THYROID 60 MG tablet Take 1 tablet (60 mg total) by mouth daily before breakfast. 90 tablet 3   metFORMIN (GLUCOPHAGE) 500 MG tablet Take 1 tablet (500 mg total) by mouth 2 (two) times daily with a meal. 180 tablet 3   pantoprazole (PROTONIX) 40 MG tablet Take 1 tablet (40 mg total) by mouth 2 (two) times daily before a meal. 60 tablet 6   Cholecalciferol (VITAMIN D) 50 MCG (2000 UT) CAPS Take 1 capsule (2,000 Units total) by mouth daily. 30 capsule    cyanocobalamin (VITAMIN B12) 1000 MCG tablet Take 1 tablet (1,000 mcg total) by mouth  daily.     GAMMA AMINOBUTYRIC ACID PO Take 750 mg by mouth daily.     Magnesium Citrate 100 MG TABS Take 200 mg by mouth daily.     OVER THE COUNTER MEDICATION Seleium 200 mg     vitamin E 400 UNIT capsule Take 400 Units by mouth daily.     No facility-administered medications prior to visit.     Per HPI unless specifically indicated in ROS section below Review of Systems  Objective:  BP 118/68   Pulse 83   Temp (!) 97.1 F (36.2 C) (Temporal)   Ht 5' 10"$  (1.778 m)   Wt 197 lb 2 oz (89.4 kg)   LMP 06/27/2019   SpO2 98%   BMI 28.28 kg/m   Wt Readings from Last 3 Encounters:  10/08/22 197 lb 2 oz (89.4 kg)  07/23/22 208 lb (94.3 kg)  06/19/22 207 lb 2 oz (94 kg)      Physical Exam    Results for orders placed or performed in visit on 10/08/22  POCT glycosylated hemoglobin (Hb A1C)  Result Value Ref Range   Hemoglobin A1C 5.4 4.0 - 5.6 %   HbA1c POC (<> result, manual entry)     HbA1c, POC (prediabetic range)     HbA1c, POC (controlled diabetic range)     Lab Results  Component Value Date   WBC 7.3 06/19/2022   HGB 12.4 06/19/2022   HCT 37.0 06/19/2022   MCV 91.6 06/19/2022   PLT 315.0 06/19/2022    CHEST - 2 VIEW  COMPARISON:  09/27/2020 FINDINGS: The heart size and mediastinal contours are within normal limits. Both lungs are clear. The visualized skeletal structures are unremarkable. IMPRESSION: No active cardiopulmonary disease.  Electronically Signed   By: Davina Poke D.O.   On: 12/24/2021 12:50  Assessment & Plan:   Problem List Items Addressed This Visit     Peripheral neuropathy   Type 2 diabetes mellitus with other specified complication ([place])   Relevant Medications   metFORMIN (GLUCOPHAGE) 500 MG tablet   Other Relevant Orders   POCT glycosylated hemoglobin (Hb A1C) (Completed)   Pre-op evaluation - Primary    RCRI = 0 Reviewed recent labs 05/2022, EKG 04/2022 and CXR 12/2021, HRCT 02/2022 and CTA chest 03/2022 - overall reassuring.  Pending pulm f/u for abnormal CT chest later this week.  Anticipate adequately low risk to proceed with planned surgical intervention.  Will forward note and results to requesting ortho office.       Relevant Orders   EKG 12-Lead   Other Visit Diagnoses     Prediabetes       Relevant Medications   metFORMIN (GLUCOPHAGE) 500 MG tablet   Gastroesophageal reflux disease, unspecified whether esophagitis present       Relevant Medications  pantoprazole (PROTONIX) 40 MG tablet        Meds ordered this encounter  Medications   ARMOUR THYROID 60 MG tablet    Sig: Take 1 tablet (60 mg total) by mouth daily before breakfast.    Dispense:  90 tablet    Refill:  4   metFORMIN (GLUCOPHAGE) 500 MG tablet    Sig: Take 1 tablet (500 mg total) by mouth daily with breakfast.    Dispense:  90 tablet    Refill:  4   pantoprazole (PROTONIX) 40 MG tablet    Sig: Take 1 tablet (40 mg total) by mouth 2 (two) times daily before a meal.    Dispense:  180 tablet    Refill:  4    Orders Placed This Encounter  Procedures   POCT glycosylated hemoglobin (Hb A1C)   EKG 12-Lead    Patient Instructions  Labs for neuropathy today Then B12 shot today.  Drop metformin to 593m once daily. May increase trulicity to [id number]. Slowly taper off gabapentin - to 1037mnightly for a few weeks then may stop.  Return for preop visit.   Follow up plan: Return if symptoms worsen or fail to improve.  JaRia BushMD

## 2022-10-08 NOTE — Assessment & Plan Note (Signed)
RCRI = 0 Reviewed recent labs 05/2022, EKG 04/2022 and CXR 12/2021, HRCT 02/2022 and CTA chest 03/2022 - overall reassuring. Pending pulm f/u for abnormal CT chest later this week.  Anticipate adequately low risk to proceed with planned surgical intervention.  Will forward note and results to requesting ortho office.

## 2022-10-08 NOTE — Patient Instructions (Addendum)
Shingrix vaccine today.  Labs for neuropathy today Then B12 shot today - monitor effect on energy levels.  Drop metformin to 56m once daily. May increase trulicity to [id number]. Slowly taper off gabapentin - to 1066mnightly for a few weeks then may stop.  Schedule diabetic eye exam (diabetic retinopathy screen).  Return for preop visit.

## 2022-10-09 ENCOUNTER — Encounter: Payer: Self-pay | Admitting: Family Medicine

## 2022-10-09 DIAGNOSIS — K219 Gastro-esophageal reflux disease without esophagitis: Secondary | ICD-10-CM | POA: Insufficient documentation

## 2022-10-09 DIAGNOSIS — R790 Abnormal level of blood mineral: Secondary | ICD-10-CM | POA: Insufficient documentation

## 2022-10-09 MED ORDER — IRON (FERROUS SULFATE) 325 (65 FE) MG PO TABS: 325.0000 mg | ORAL_TABLET | ORAL | Status: DC

## 2022-10-09 NOTE — Assessment & Plan Note (Signed)
She's only taking MVI, not independent b12 vitamin . Update labs, b12 shot today, then determine need for further oral b12 replacement

## 2022-10-09 NOTE — Assessment & Plan Note (Signed)
H/o low iron stores - update iron panel. Ferritin remains low normal - suggest starting oral iron QOD if tolerated.

## 2022-10-09 NOTE — Assessment & Plan Note (Addendum)
Great control since starting trulicity. She is interested in increased dose. Will drop metformin to 546m once daily, increase trulicity to [id number], monitoring for increase in side effects.  Encouraged she schedule eye exam as due.

## 2022-10-09 NOTE — Assessment & Plan Note (Signed)
Cymbalta caused apathy (2023) Notes gabapentin not very effective at low dose, hesitant to increase dose, desires to trial off. Consider Lyrica.  See below.

## 2022-10-09 NOTE — Assessment & Plan Note (Addendum)
Longstanding neuropathy, describes bilateral symmetrical length dependent neuropathy suggestive of diabetic neuropathy however with h/o sjogren's, could be related to this disease.  Will further evaluation with neuropathy labs including B1, B12, SPEP and consider return to rheum. She notes gabapentin hasn't been as effective as desired and she's hesitant to increase dose due to side effects. Will taper off, consider trial Lyrica in future. B12 shot after labs today - monitor effect on neuropathy and energy levels.

## 2022-10-09 NOTE — Telephone Encounter (Signed)
Pt seen in office on 10/08/22 but changed nature of visit. States surgery will be postponed until 12/2022. Pt will call back to schedule pre-op eval.   [Form is in basket on Lisa's desk.]

## 2022-10-09 NOTE — Assessment & Plan Note (Signed)
Followed by rheum Corena Pilgrim), could contribute to neuropathy - consider return to rheumo for evaluation.

## 2022-10-10 ENCOUNTER — Ambulatory Visit: Payer: 59 | Admitting: Pulmonary Disease

## 2022-10-10 ENCOUNTER — Other Ambulatory Visit: Payer: Self-pay | Admitting: Family Medicine

## 2022-10-10 DIAGNOSIS — K219 Gastro-esophageal reflux disease without esophagitis: Secondary | ICD-10-CM

## 2022-10-10 NOTE — Telephone Encounter (Signed)
Plz submit PA for pantoprazole 40 mg tab.

## 2022-10-12 LAB — PROTEIN ELECTROPHORESIS, SERUM, WITH REFLEX
Albumin ELP: 4.2 g/dL (ref 3.8–4.8)
Alpha 1: 0.3 g/dL (ref 0.2–0.3)
Alpha 2: 0.8 g/dL (ref 0.5–0.9)
Beta 2: 0.3 g/dL (ref 0.2–0.5)
Beta Globulin: 0.4 g/dL (ref 0.4–0.6)
Gamma Globulin: 1.1 g/dL (ref 0.8–1.7)
Total Protein: 7.2 g/dL (ref 6.1–8.1)

## 2022-10-12 LAB — VITAMIN B1: Vitamin B1 (Thiamine): 12 nmol/L (ref 8–30)

## 2022-10-14 ENCOUNTER — Other Ambulatory Visit (HOSPITAL_COMMUNITY): Payer: Self-pay

## 2022-10-14 ENCOUNTER — Ambulatory Visit: Payer: 59 | Admitting: Family Medicine

## 2022-10-16 DIAGNOSIS — F329 Major depressive disorder, single episode, unspecified: Secondary | ICD-10-CM | POA: Diagnosis not present

## 2022-10-16 NOTE — Telephone Encounter (Signed)
Noted  

## 2022-10-16 NOTE — Telephone Encounter (Signed)
[facility] called to follow up on form and said they were waiting to be done. They said they would follow up with the patient

## 2022-10-16 NOTE — Telephone Encounter (Signed)
Wells Guiles from [facility] called to check status of patient's surgical clearance-  informed that patient has a scheduled appointment in April.

## 2022-10-23 ENCOUNTER — Ambulatory Visit: Payer: 59 | Admitting: Pulmonary Disease

## 2022-10-29 ENCOUNTER — Other Ambulatory Visit (HOSPITAL_COMMUNITY): Payer: Self-pay

## 2022-10-29 NOTE — Telephone Encounter (Signed)
Apologies for the delay. Medication has been denied according to separate encounter in pt's chart.

## 2022-10-30 DIAGNOSIS — F329 Major depressive disorder, single episode, unspecified: Secondary | ICD-10-CM | POA: Diagnosis not present

## 2022-11-04 ENCOUNTER — Telehealth: Payer: Self-pay | Admitting: Family Medicine

## 2022-11-04 NOTE — Telephone Encounter (Signed)
Plz submit PA for pantoprazole 40 mg tab. It looks like pt changed insurance when previous PA was submitted.

## 2022-11-04 NOTE — Telephone Encounter (Signed)
Rajani from [facility] called asking for a prior auth for the pt's meds, pantoprazole (PROTONIX) 40 MG tablet? Rajani provided a key code - [id number], if office visits [email or web address] Call back # [phone]

## 2022-11-05 ENCOUNTER — Encounter: Payer: Self-pay | Admitting: Family Medicine

## 2022-11-05 ENCOUNTER — Ambulatory Visit: Payer: 59 | Admitting: Family Medicine

## 2022-11-05 VITALS — BP 110/62 | HR 91 | Temp 97.2°F | Ht 70.0 in | Wt 190.2 lb

## 2022-11-05 DIAGNOSIS — M25551 Pain in right hip: Secondary | ICD-10-CM

## 2022-11-05 DIAGNOSIS — R5381 Other malaise: Secondary | ICD-10-CM

## 2022-11-05 DIAGNOSIS — R5383 Other fatigue: Secondary | ICD-10-CM

## 2022-11-05 DIAGNOSIS — E063 Autoimmune thyroiditis: Secondary | ICD-10-CM

## 2022-11-05 DIAGNOSIS — K5900 Constipation, unspecified: Secondary | ICD-10-CM

## 2022-11-05 DIAGNOSIS — G6289 Other specified polyneuropathies: Secondary | ICD-10-CM

## 2022-11-05 DIAGNOSIS — E1169 Type 2 diabetes mellitus with other specified complication: Secondary | ICD-10-CM

## 2022-11-05 DIAGNOSIS — E038 Other specified hypothyroidism: Secondary | ICD-10-CM | POA: Diagnosis not present

## 2022-11-05 DIAGNOSIS — R790 Abnormal level of blood mineral: Secondary | ICD-10-CM | POA: Diagnosis not present

## 2022-11-05 MED ORDER — LEVOTHYROXINE SODIUM 88 MCG PO TABS: 88.0000 ug | ORAL_TABLET | Freq: Every day | ORAL | 3 refills | Status: DC

## 2022-11-05 MED ORDER — TRULICITY 0.75 MG/0.5ML ~~LOC~~ SOAJ: 0.7500 mg | SUBCUTANEOUS | 5 refills | Status: DC

## 2022-11-05 NOTE — Patient Instructions (Addendum)
Stop metformin. Keep an eye on sugars. Fingerstick sugar check today.  Check on preferred glucose meter brand by insurance - I've sent a prescription to your pharmacy.  Ensure staying well hydrated, ok to liberalize salt intake a little bit. Continue compression stockings.  Change from armour thyroid 60 to levothyroxine 68mg daily - recheck thyroid levels in 2 months. Let [place] sooner if any trouble with this.  For right lateral hip pain - likely hip bursitis - do exercises provided today, may use ibuprofen as needed and topical voltaren gel.  Good to see you today Return as needed or in 2 months for follow up visit.

## 2022-11-05 NOTE — Progress Notes (Unsigned)
Patient ID: [name], female    DOB: 11/17/1960, 62 y.o.   MRN: [id number]  This visit was conducted in person.  BP 110/62   Pulse 91   Temp (!) 97.2 F (36.2 C) (Temporal)   Ht '5\' 10"'$  (1.778 m)   Wt 190 lb 4 oz (86.3 kg)   LMP 06/27/2019   SpO2 98%   BMI 27.30 kg/m    CC: discuss neuropathy  Subjective:   HPI: [name] is a 62 y.o. female presenting on 11/05/2022 for Medical Management of Chronic Issues (Wants to discuss neuropathy in R foot. States sensation is now a buzzing/vibration. Worse at night. Also, wants to discuss changing thyroid med and BP. Pt brought in home BP monitor to compare. Reading in office today- 93/72. )    Last week had episode of malaise, lightheaded, fatigue, dizziness, and woozy head feel. This lasted ~2 days. She started feeling better after pushing water, salt, licorice tea, compression socks.  Has been checking BP at home - 80-90/50-60s.  She notes family predisposition to syncope.   Peripheral neuropathy - present since 2010s - she describes numbness and tingling and burning pain to bilateral feet, occasionally to hands. She notes gabapentin '100mg'$  TID is not as effective - however has continued this. Cymbalta caused apathy. H/o low B12 levels - now repleted with MVI in place of B12 trying to consolidate meds.  More recently noted vibration/humming sensation to R foot. More prominent/noticeable at night time. No other body areas affected. She does note intermittent R hip discomfort with walking.   H/o L hip replacement (07/2005).   Known Sjogren's - sees rheum Dr Estanislado Pandy. Prefers to avoid medication.   Hypothyroidism - on armour thyroid '60mg'$  daily. Requests change in therapy due to cost, would like to try levothyroxine. She doesn't take biotin.    She's not taking oral iron - due to increased constipation on trulicity.   DM - continues metformin '500mg'$  daily and trulicity 1.'5mg'$  weekly, tolerating well with ongoing weight loss  noted. Notes increased constipation. She recently joined the gym - and is working on Psychiatrist.  Lab Results  Component Value Date   HGBA1C 5.4 10/08/2022        Relevant past medical, surgical, family and social history reviewed and updated as indicated. Interim medical history since our last visit reviewed. Allergies and medications reviewed and updated. Outpatient Medications Prior to Visit  Medication Sig Dispense Refill   ALPRAZolam (XANAX) 0.25 MG tablet Take 1 tablet (0.25 mg total) by mouth 2 (two) times daily as needed for anxiety. 30 tablet 0   CALCIUM-MAGNESIUM-ZINC PO Take by mouth daily.     cetirizine (ZYRTEC) 10 MG tablet Take 10 mg by mouth at bedtime.     gabapentin (NEURONTIN) 100 MG capsule Take 1-2 capsules (100-200 mg total) by mouth 3 (three) times daily. 540 capsule 1   Iron, Ferrous Sulfate, 325 (65 Fe) MG TABS Take 325 mg by mouth every Monday, Wednesday, and Friday.     Loteprednol Etabonate (EYSUVIS OP) Apply to eye as needed.     Melatonin 10 MG TABS Take 10 mg by mouth at bedtime.     methocarbamol (ROBAXIN) 500 MG tablet Take 1 tablet (500 mg total) by mouth 2 (two) times daily as needed for muscle spasms. 60 tablet 1   Multiple Vitamins-Minerals (CENTRUM SILVER 50+WOMEN PO) Take 1 tablet by mouth daily.     nystatin ointment (MYCOSTATIN) Apply 1 application topically 2 (two)  times daily. 30 g 0   Perfluorohexyloctane (MIEBO) 1.338 GM/ML SOLN Apply 1 drop to eye 4 (four) times daily as needed (dry eyes).     triamcinolone cream (KENALOG) 0.1 % Apply 1 Application topically as needed. 15 g 0   ARMOUR THYROID 60 MG tablet Take 1 tablet (60 mg total) by mouth daily before breakfast. 90 tablet 4   Dulaglutide (TRULICITY) 1.5 0000000 SOPN Inject 1.5 mg into the skin once a week. 2 mL 11   metFORMIN (GLUCOPHAGE) 500 MG tablet Take 1 tablet (500 mg total) by mouth daily with breakfast. 90 tablet 4   pantoprazole (PROTONIX) 40 MG tablet Take 1 tablet (40 mg  total) by mouth 2 (two) times daily before a meal. 180 tablet 4   metFORMIN (GLUCOPHAGE) 500 MG tablet Take 1 tablet (500 mg total) by mouth daily with breakfast.     Omega-3 Fatty Acids (FISH OIL PO) Take by mouth daily.     No facility-administered medications prior to visit.     Per HPI unless specifically indicated in ROS section below Review of Systems  Objective:  BP 110/62   Pulse 91   Temp (!) 97.2 F (36.2 C) (Temporal)   Ht '5\' 10"'$  (1.778 m)   Wt 190 lb 4 oz (86.3 kg)   LMP 06/27/2019   SpO2 98%   BMI 27.30 kg/m   Wt Readings from Last 3 Encounters:  11/05/22 190 lb 4 oz (86.3 kg)  10/08/22 197 lb 2 oz (89.4 kg)  07/23/22 208 lb (94.3 kg)      Physical Exam Vitals and nursing note reviewed.  Constitutional:      Appearance: Normal appearance. She is not ill-appearing.  Cardiovascular:     Rate and Rhythm: Normal rate and regular rhythm.     Pulses: Normal pulses.     Heart sounds: Normal heart sounds. No murmur heard. Pulmonary:     Effort: Pulmonary effort is normal. No respiratory distress.     Breath sounds: Normal breath sounds. No wheezing, rhonchi or rales.  Musculoskeletal:     Right lower leg: No edema.     Left lower leg: No edema.     Comments:  No pain with int/ext rotation at hip. No pain at SIJ, or sciatic notch bilaterally.  Reproducible pain at R GTB, not at left  Skin:    General: Skin is warm and dry.     Findings: No rash.  Neurological:     Mental Status: She is alert.  Psychiatric:        Mood and Affect: Mood normal.        Behavior: Behavior normal.       Results for orders placed or performed in visit on 10/08/22  Vitamin B12  Result Value Ref Range   Vitamin B-12 503 211 - 911 pg/mL  Vitamin B1  Result Value Ref Range   Vitamin B1 (Thiamine) 12 8 - 30 nmol/L  Serum protein electrophoresis with reflex  Result Value Ref Range   Total Protein 7.2 6.1 - 8.1 g/dL   Albumin ELP 4.2 3.8 - 4.8 g/dL   Alpha 1 0.3 0.2 - 0.3  g/dL   Alpha 2 0.8 0.5 - 0.9 g/dL   Beta Globulin 0.4 0.4 - 0.6 g/dL   Beta 2 0.3 0.2 - 0.5 g/dL   Gamma Globulin 1.1 0.8 - 1.7 g/dL   SPE Interp.    Ferritin  Result Value Ref Range   Ferritin 16.7 10.0 - 291.0  ng/mL  IBC panel  Result Value Ref Range   Iron 85 42 - 145 ug/dL   Transferrin 232.0 212.0 - 360.0 mg/dL   Saturation Ratios 26.2 20.0 - 50.0 %   TIBC 324.8 250.0 - 450.0 mcg/dL  Comprehensive metabolic panel  Result Value Ref Range   Sodium 137 135 - 145 mEq/L   Potassium 4.7 3.5 - 5.1 mEq/L   Chloride 102 96 - 112 mEq/L   CO2 27 19 - 32 mEq/L   Glucose, Bld 82 70 - 99 mg/dL   BUN 24 (H) 6 - 23 mg/dL   Creatinine, Ser 0.99 0.40 - 1.20 mg/dL   Total Bilirubin 0.4 0.2 - 1.2 mg/dL   Alkaline Phosphatase 68 39 - 117 U/L   AST 18 0 - 37 U/L   ALT 13 0 - 35 U/L   Total Protein 7.2 6.0 - 8.3 g/dL   Albumin 4.4 3.5 - 5.2 g/dL   GFR 62.28 >60.00 mL/min   Calcium 10.4 8.4 - 10.5 mg/dL  CBC with Differential/Platelet  Result Value Ref Range   WBC 5.4 4.0 - 10.5 K/uL   RBC 4.32 3.87 - 5.11 Mil/uL   Hemoglobin 13.3 12.0 - 15.0 g/dL   HCT 39.7 36.0 - 46.0 %   MCV 91.8 78.0 - 100.0 fl   MCHC 33.5 30.0 - 36.0 g/dL   RDW 15.0 11.5 - 15.5 %   Platelets 346.0 150.0 - 400.0 K/uL   Neutrophils Relative % 48.7 43.0 - 77.0 %   Lymphocytes Relative 37.7 12.0 - 46.0 %   Monocytes Relative 7.7 3.0 - 12.0 %   Eosinophils Relative 4.7 0.0 - 5.0 %   Basophils Relative 1.2 0.0 - 3.0 %   Neutro Abs 2.6 1.4 - 7.7 K/uL   Lymphs Abs 2.0 0.7 - 4.0 K/uL   Monocytes Absolute 0.4 0.1 - 1.0 K/uL   Eosinophils Absolute 0.3 0.0 - 0.7 K/uL   Basophils Absolute 0.1 0.0 - 0.1 K/uL  POCT glycosylated hemoglobin (Hb A1C)  Result Value Ref Range   Hemoglobin A1C 5.4 4.0 - 5.6 %   HbA1c POC (<> result, manual entry)     HbA1c, POC (prediabetic range)     HbA1c, POC (controlled diabetic range)     Lab Results  Component Value Date   TSH 0.50 07/15/2022    Assessment & Plan:   Problem List  Items Addressed This Visit     Hypothyroidism due to Hashimoto's thyroiditis    Chronically on armour thyroid '60mg'$  daily however cost has significantly increased.  She is interested in trial of levothyroxine - will Rx 70mg levothyroxine with rpt TFTs planned in 2 months.  Discussed need to watch for formulary change and let [place] if this occurs to recheck thyroid levels shortly after.       Relevant Medications   levothyroxine (SYNTHROID) 88 MCG tablet   Peripheral neuropathy    Present >10 yrs.  Recent labwork reassuring.  She continues gabapentin '100mg'$  TID.  Discussed possibly related to Sjogren's disease.       Type 2 diabetes mellitus with other specified complication (HCC)    A1234565.4% on last check.  With episodes of malaise, lightheadedness, and recent worsening constipation, will change antihyperglycemic regimen by dropping trulicity to 0.'75mg'$  weekly, continue metformin '500mg'$  daily.  Reassess A1c at 2 mo f/u visit  Forgot to check fingerstick cbg today.       Relevant Medications   metFORMIN (GLUCOPHAGE) 500 MG tablet   Dulaglutide (  TRULICITY) A999333 0000000 SOPN   Low iron stores    Has not yet started oral iron due to worsening constipation from trulicity      Malaise and fatigue - Primary    Episode last week of malaise, fatigue, lasting 2-3 days, associated with dizziness/lightheadedness. Discussed possible etiologies including hypotension, hypoglycemia, other. She notes BPs at home have been running low - discussed supportive measures to improve blood pressure including liberalized salt intake, increased fluid intake, compression stockings. Discussed monitoring sugars at home in case hypoglycemia related - generic glucometer Rx sent to pharmacy, and will drop trulicity dose to 0.'75mg'$  in h/o worsening constipation and A1c 5.4% on last check. See above       Constipation    Notes worsening constipation on trulicity. Has not yet started iron.  Will drop trulicity back  to 0.'75mg'$  weekly.       Lateral pain of right hip    Exam most consistent with greater trochanteric pain syndrome due to possible bursitis.  Rec oral and topical NSAID PRN Provided with exercises from [facility] pt advisor, update if not improving with this.         Meds ordered this encounter  Medications   Dulaglutide (TRULICITY) A999333 0000000 SOPN    Sig: Inject 0.75 mg into the skin once a week.    Dispense:  2 mL    Refill:  5    Note new dose   levothyroxine (SYNTHROID) 88 MCG tablet    Sig: Take 1 tablet (88 mcg total) by mouth daily.    Dispense:  30 tablet    Refill:  3   Blood Glucose Monitoring Suppl DEVI    Sig: 1 each by Does not apply route in the morning, at noon, and at bedtime. May substitute to any manufacturer covered by patient's insurance.    Dispense:  1 each    Refill:  0   Glucose Blood (BLOOD GLUCOSE TEST STRIPS) STRP    Sig: 1 each by In Vitro route in the morning, at noon, and at bedtime. May substitute to any manufacturer covered by patient's insurance.    Dispense:  100 strip    Refill:  0   Lancet Device MISC    Sig: 1 each by Does not apply route in the morning, at noon, and at bedtime. May substitute to any manufacturer covered by patient's insurance.    Dispense:  1 each    Refill:  0   Lancets Misc. MISC    Sig: 1 each by Does not apply route in the morning, at noon, and at bedtime. May substitute to any manufacturer covered by patient's insurance.    Dispense:  100 each    Refill:  0    No orders of the defined types were placed in this encounter.   Patient Instructions  Stop metformin. Keep an eye on sugars. Fingerstick sugar check today.  Check on preferred glucose meter brand by insurance - I've sent a prescription to your pharmacy.  Ensure staying well hydrated, ok to liberalize salt intake a little bit. Continue compression stockings.  Change from armour thyroid 60 to levothyroxine 59mg daily - recheck thyroid levels in 2 months. Let [place] know sooner if any trouble with this.  For right lateral hip pain - likely hip bursitis - do exercises provided today, may use ibuprofen as needed and topical voltaren gel.  Good to see you today Return as needed or in 2 months for follow up visit.  Follow up plan: Return in about 2 months (around 01/05/2023), or if symptoms worsen or fail to improve, for follow up visit.  Ria Bush, MD

## 2022-11-06 ENCOUNTER — Other Ambulatory Visit (HOSPITAL_COMMUNITY): Payer: Self-pay

## 2022-11-06 MED ORDER — BLOOD GLUCOSE MONITORING SUPPL DEVI: 1.0000 | Freq: Three times a day (TID) | 0 refills | Status: DC

## 2022-11-06 MED ORDER — LANCET DEVICE MISC: 1.0000 | Freq: Three times a day (TID) | 0 refills | Status: AC

## 2022-11-06 MED ORDER — LANCETS MISC. MISC: 1.0000 | Freq: Three times a day (TID) | 0 refills | Status: AC

## 2022-11-06 MED ORDER — BLOOD GLUCOSE TEST VI STRP: 1.0000 | ORAL_STRIP | Freq: Three times a day (TID) | 0 refills | Status: AC

## 2022-11-06 NOTE — Telephone Encounter (Signed)
Can we resubmit - pt's primary insurance is [employer] no [employer]BS.

## 2022-11-06 NOTE — Telephone Encounter (Signed)
Pharmacy Patient Advocate Encounter   Received notification that prior authorization for Pantoprazole '40mg'$  tabs is required/requested.  Per Test Claim: max quantity of 90 tabs in 365 days. Quanitity of 0 remaining.    PA submitted on 11/06/22 to (ins) [employer] via CoverMyMeds Key # [id number] for Pantoprazole has been approved.    PA# PA Case ID #: [id number] Effective dates: 11/06/22 through 11/06/23  Per test claim, came back as a refill too soon and can be fill 11/13/2022.

## 2022-11-06 NOTE — Telephone Encounter (Signed)
Spoke with pt making her aware of PA approval and rx cannot be filled until 11/13/22. Pt verbalizes understanding and expresses her thanks.

## 2022-11-07 ENCOUNTER — Encounter: Payer: Self-pay | Admitting: Family Medicine

## 2022-11-07 DIAGNOSIS — M25551 Pain in right hip: Secondary | ICD-10-CM | POA: Insufficient documentation

## 2022-11-07 DIAGNOSIS — R5381 Other malaise: Secondary | ICD-10-CM | POA: Insufficient documentation

## 2022-11-07 DIAGNOSIS — K5900 Constipation, unspecified: Secondary | ICD-10-CM | POA: Insufficient documentation

## 2022-11-07 NOTE — Assessment & Plan Note (Signed)
Episode last week of malaise, fatigue, lasting 2-3 days, associated with dizziness/lightheadedness. Discussed possible etiologies including hypotension, hypoglycemia, other. She notes BPs at home have been running low - discussed supportive measures to improve blood pressure including liberalized salt intake, increased fluid intake, compression stockings. Discussed monitoring sugars at home in case hypoglycemia related - generic glucometer Rx sent to pharmacy, and will drop trulicity dose to 0.'75mg'$  in h/o worsening constipation and A1c 5.4% on last check. See above

## 2022-11-07 NOTE — Assessment & Plan Note (Signed)
Notes worsening constipation on trulicity. Has not yet started iron.  Will drop trulicity back to 0.'75mg'$  weekly.

## 2022-11-07 NOTE — Assessment & Plan Note (Signed)
Chronically on armour thyroid '60mg'$  daily however cost has significantly increased.  She is interested in trial of levothyroxine - will Rx 80mg levothyroxine with rpt TFTs planned in 2 months.  Discussed need to watch for formulary change and let [place] if this occurs to recheck thyroid levels shortly after.

## 2022-11-07 NOTE — Assessment & Plan Note (Addendum)
A1c 5.4% on last check.  With episodes of malaise, lightheadedness, and recent worsening constipation, will change antihyperglycemic regimen by dropping trulicity to 0.'75mg'$  weekly, continue metformin '500mg'$  daily.  Reassess A1c at 2 mo f/u visit  Forgot to check fingerstick cbg today.

## 2022-11-07 NOTE — Assessment & Plan Note (Signed)
Has not yet started oral iron due to worsening constipation from trulicity

## 2022-11-07 NOTE — Assessment & Plan Note (Signed)
Present >10 yrs.  Recent labwork reassuring.  She continues gabapentin '100mg'$  TID.  Discussed possibly related to Sjogren's disease.

## 2022-11-07 NOTE — Assessment & Plan Note (Signed)
Exam most consistent with greater trochanteric pain syndrome due to possible bursitis.  Rec oral and topical NSAID PRN Provided with exercises from SM pt advisor, update if not improving with this.

## 2022-11-11 ENCOUNTER — Other Ambulatory Visit (HOSPITAL_COMMUNITY): Payer: Self-pay

## 2022-11-11 DIAGNOSIS — R262 Difficulty in walking, not elsewhere classified: Secondary | ICD-10-CM | POA: Diagnosis not present

## 2022-11-11 DIAGNOSIS — M25562 Pain in left knee: Secondary | ICD-10-CM | POA: Diagnosis not present

## 2022-11-11 DIAGNOSIS — M25561 Pain in right knee: Secondary | ICD-10-CM | POA: Diagnosis not present

## 2022-11-11 DIAGNOSIS — R531 Weakness: Secondary | ICD-10-CM | POA: Diagnosis not present

## 2022-11-12 ENCOUNTER — Other Ambulatory Visit: Payer: Self-pay | Admitting: Family Medicine

## 2022-11-12 DIAGNOSIS — L304 Erythema intertrigo: Secondary | ICD-10-CM | POA: Diagnosis not present

## 2022-11-12 DIAGNOSIS — D2272 Melanocytic nevi of left lower limb, including hip: Secondary | ICD-10-CM | POA: Diagnosis not present

## 2022-11-12 DIAGNOSIS — L820 Inflamed seborrheic keratosis: Secondary | ICD-10-CM | POA: Diagnosis not present

## 2022-11-12 DIAGNOSIS — L538 Other specified erythematous conditions: Secondary | ICD-10-CM | POA: Diagnosis not present

## 2022-11-12 DIAGNOSIS — L2089 Other atopic dermatitis: Secondary | ICD-10-CM | POA: Diagnosis not present

## 2022-11-12 DIAGNOSIS — R208 Other disturbances of skin sensation: Secondary | ICD-10-CM | POA: Diagnosis not present

## 2022-11-12 DIAGNOSIS — D2261 Melanocytic nevi of right upper limb, including shoulder: Secondary | ICD-10-CM | POA: Diagnosis not present

## 2022-11-12 DIAGNOSIS — L570 Actinic keratosis: Secondary | ICD-10-CM | POA: Diagnosis not present

## 2022-11-12 DIAGNOSIS — D225 Melanocytic nevi of trunk: Secondary | ICD-10-CM | POA: Diagnosis not present

## 2022-11-12 DIAGNOSIS — F325 Major depressive disorder, single episode, in full remission: Secondary | ICD-10-CM

## 2022-11-13 NOTE — Telephone Encounter (Signed)
ERx 

## 2022-11-13 NOTE — Telephone Encounter (Signed)
Name of Medication: Alprazolam Name of Pharmacy: [facility] or Written Date and Quantity: 06/19/22, #30 Last Office Visit and Type: 11/05/22, f/u Next Office Visit and Type: none Last Controlled Substance Agreement Date: 06/28/14 Last UDS: 06/28/14

## 2022-11-14 ENCOUNTER — Encounter: Payer: Self-pay | Admitting: Family Medicine

## 2022-11-21 NOTE — Telephone Encounter (Signed)
Apologies for the delay, Pantoprazole has been approved and documented in separate encounter.

## 2022-11-27 MED ORDER — PREGABALIN 25 MG PO CAPS: ORAL_CAPSULE | ORAL | 0 refills | Status: DC

## 2022-11-27 NOTE — Telephone Encounter (Signed)
Please submit PA for patient - difficulty tolerating gabapentin due to fatigue, mood change

## 2022-11-29 DIAGNOSIS — F329 Major depressive disorder, single episode, unspecified: Secondary | ICD-10-CM | POA: Diagnosis not present

## 2022-12-02 ENCOUNTER — Ambulatory Visit: Payer: 59 | Admitting: Pulmonary Disease

## 2022-12-02 ENCOUNTER — Telehealth: Payer: Self-pay

## 2022-12-02 NOTE — Telephone Encounter (Signed)
ATC X1 LVM for patient to call the office back. Please schedule apt for surgical clearance with Dr. Judeth Horn or APP

## 2022-12-02 NOTE — Telephone Encounter (Signed)
Closing encounter since surgery is postponed

## 2022-12-02 NOTE — Telephone Encounter (Signed)
Pt called the office stating that she was scheduled for a surgical clearance visit today 4/8 but states that she cancelled that visit as her surgery is currently being postponed.   Pt said when she does decide to have the surgery rescheduled, she will call the office to schedule the surgical clearance visit with our office at that time. Routing to surgical clearance pool as an FYI. Nothing further needed.

## 2022-12-03 ENCOUNTER — Telehealth: Payer: Self-pay

## 2022-12-03 ENCOUNTER — Other Ambulatory Visit (HOSPITAL_COMMUNITY): Payer: Self-pay

## 2022-12-03 NOTE — Telephone Encounter (Signed)
Pharmacy Patient Advocate Encounter   Received notification from [facility] that prior authorization for Pregabalin 25MG  capsules is required/requested.  Per Test Claim: PA required   PA submitted on 12/03/22 to (ins) [employer] via CoverMyMeds Key or (Medicaid) confirmation # [id number] Status is pending

## 2022-12-03 NOTE — Telephone Encounter (Signed)
Effective: 12-03-2022 to 12-02-2023

## 2022-12-03 NOTE — Telephone Encounter (Signed)
Patient Advocate Encounter  Prior Authorization for Pregabalin 25MG  capsules has been approved through [employer].    KeyGarry Heater  Effective: 12-03-2022 to 12-02-2022

## 2022-12-03 NOTE — Telephone Encounter (Signed)
Patient notified by telephone that PA has been approved for Pregabalin.

## 2022-12-04 ENCOUNTER — Ambulatory Visit: Payer: BC Managed Care – PPO | Admitting: Dermatology

## 2022-12-11 DIAGNOSIS — F329 Major depressive disorder, single episode, unspecified: Secondary | ICD-10-CM | POA: Diagnosis not present

## 2022-12-17 ENCOUNTER — Ambulatory Visit: Payer: BC Managed Care – PPO | Admitting: Dermatology

## 2023-01-01 DIAGNOSIS — F329 Major depressive disorder, single episode, unspecified: Secondary | ICD-10-CM | POA: Diagnosis not present

## 2023-01-06 ENCOUNTER — Other Ambulatory Visit: Payer: Self-pay | Admitting: Family Medicine

## 2023-01-06 NOTE — Telephone Encounter (Signed)
Refill request  Lyrica Last refill 11/27/22 #60 Last office visit 11/05/22

## 2023-01-06 NOTE — Telephone Encounter (Signed)
ERx 

## 2023-01-07 ENCOUNTER — Ambulatory Visit (INDEPENDENT_AMBULATORY_CARE_PROVIDER_SITE_OTHER): Payer: 59 | Admitting: Family Medicine

## 2023-01-07 ENCOUNTER — Encounter: Payer: Self-pay | Admitting: Family Medicine

## 2023-01-07 VITALS — BP 116/70 | HR 63 | Temp 97.0°F | Ht 70.0 in | Wt 181.2 lb

## 2023-01-07 DIAGNOSIS — R300 Dysuria: Secondary | ICD-10-CM

## 2023-01-07 DIAGNOSIS — E038 Other specified hypothyroidism: Secondary | ICD-10-CM | POA: Diagnosis not present

## 2023-01-07 DIAGNOSIS — Z780 Asymptomatic menopausal state: Secondary | ICD-10-CM

## 2023-01-07 DIAGNOSIS — M3509 Sicca syndrome with other organ involvement: Secondary | ICD-10-CM

## 2023-01-07 DIAGNOSIS — G6289 Other specified polyneuropathies: Secondary | ICD-10-CM | POA: Diagnosis not present

## 2023-01-07 DIAGNOSIS — R21 Rash and other nonspecific skin eruption: Secondary | ICD-10-CM | POA: Diagnosis not present

## 2023-01-07 DIAGNOSIS — E1169 Type 2 diabetes mellitus with other specified complication: Secondary | ICD-10-CM | POA: Diagnosis not present

## 2023-01-07 DIAGNOSIS — Z7985 Long-term (current) use of injectable non-insulin antidiabetic drugs: Secondary | ICD-10-CM

## 2023-01-07 DIAGNOSIS — E063 Autoimmune thyroiditis: Secondary | ICD-10-CM | POA: Diagnosis not present

## 2023-01-07 DIAGNOSIS — R8290 Unspecified abnormal findings in urine: Secondary | ICD-10-CM | POA: Diagnosis not present

## 2023-01-07 DIAGNOSIS — R3129 Other microscopic hematuria: Secondary | ICD-10-CM | POA: Diagnosis not present

## 2023-01-07 LAB — POC URINALSYSI DIPSTICK (AUTOMATED)
Bilirubin, UA: NEGATIVE
Glucose, UA: NEGATIVE
Ketones, UA: NEGATIVE
Leukocytes, UA: NEGATIVE
Nitrite, UA: NEGATIVE
Protein, UA: NEGATIVE
Spec Grav, UA: 1.015 (ref 1.010–1.025)
Urobilinogen, UA: 0.2 E.U./dL
pH, UA: 6 (ref 5.0–8.0)

## 2023-01-07 LAB — POCT GLYCOSYLATED HEMOGLOBIN (HGB A1C): Hemoglobin A1C: 5.3 % (ref 4.0–5.6)

## 2023-01-07 LAB — TSH: TSH: 0.41 u[IU]/mL (ref 0.35–5.50)

## 2023-01-07 MED ORDER — SULFAMETHOXAZOLE-TRIMETHOPRIM 800-160 MG PO TABS: 1.0000 | ORAL_TABLET | Freq: Two times a day (BID) | ORAL | 0 refills | Status: DC

## 2023-01-07 MED ORDER — PREGABALIN 50 MG PO CAPS: 50.0000 mg | ORAL_CAPSULE | Freq: Two times a day (BID) | ORAL | 3 refills | Status: DC

## 2023-01-07 MED ORDER — FLUCONAZOLE 150 MG PO TABS: 150.0000 mg | ORAL_TABLET | Freq: Once | ORAL | 0 refills | Status: AC

## 2023-01-07 NOTE — Patient Instructions (Addendum)
A1c today, urinalysis today, urine culture sent off as well. Thyroid check today Urine with blood ?infection related vs other.  Start antibiotic bactrim twice daily for 3 days, schedule nurse visit in 2 weeks to repeat urine test. May take diflucan after antibiotics.  If culture negative, I will order CT scan for further evaluation of blood in urine and refer you to urologist.  Continue current medicines, ok to try tapering lyrica dose to 25mg /50mg  daily for 1 week then increase to 50mg  twice daily, let us know how you do wit this.  Return in 3 months for follow up visit

## 2023-01-07 NOTE — Progress Notes (Unsigned)
Ph: [phone] Fax: [phone]   Patient ID: [name], female    DOB: [birth year], 62 y.o.   MRN: [id number]  This visit was conducted in person.  BP 116/70   Pulse 63   Temp (!) 97 F (36.1 C) (Temporal)   Ht 5\' 10"  (1.778 m)   Wt 181 lb 4 oz (82.2 kg)   LMP 06/27/2019   SpO2 96%   BMI 26.01 kg/m    CC: 2 mo DM f/u visit  Subjective:   HPI: [name] is a 62 y.o. female presenting on 01/07/2023 for Medical Management of Chronic Issues (Here for 2 mo DM and thyroid f/u. Also, c/o itching when urinating, vaginal itching and foul urine smell. Sxs started 6 mos ago. )   External vulvar irritation ongoing for 6 months - has treated with witch hazel and vaseline. Over the past week noticing foul smelling urine, discomfort with urination, some external irritation. No vaginal discharge. No smoking history or exposure to printing fumes. Uses occasional nystatin cream. No fevers/chills, nausea, vomiting, flank pain, hematuria. No noted external rash. Postmenopausal. No h/o kidney stones, no fmhx bladder or kidney disease.   Recently started probiotic.  See prior note for details.  Known Sjogren's - followed by rheum Dr Corliss Skains, not on medication  Peripheral neuropathy - present since 2010s - she describes numbness and tingling and burning pain to bilateral feet, occasionally to hands. She notes gabapentin 100mg  TID is not as effective - changed to lyrica a few months ago. Cymbalta caused apathy. H/o low B12 levels - now repleted with MVI in place of B12 trying to consolidate meds.    Hypothyroidism - last visit we changed from armour thyroid to levothyroxine daily. Overall tolerating well.  Lab Results  Component Value Date   TSH 0.41 01/07/2023   Noticing some morning headaches - wonders if med related (thyroid vs lyrica).   27 lb weight loss in the past 6 months since trulicity started  DM - does regularly check sugars and brings numbers from this  week - fasting <100, postprandial 100-110s. Compliant with antihyperglycemic regimen which includes: trulicity 0.75mg  weekly, metformin 500mg  daily. She stopped metformin several 1 wk ago. Notes some constipation on trulicity. She would like to ultimately come off GLP1rA. Denies low sugars or hypoglycemic symptoms. Denies paresthesias, blurry vision. Last diabetic eye exam DUE. Glucometer brand: accuchek guide. Last foot exam: 09/2022. DSME: has not completed, declines.  Lab Results  Component Value Date   HGBA1C 5.3 01/07/2023   Diabetic Foot Exam - Simple   No data filed    No results found for: "MICROALBUR", "[id number]"      Relevant past medical, surgical, family and social history reviewed and updated as indicated. Interim medical history since our last visit reviewed. Allergies and medications reviewed and updated. Outpatient Medications Prior to Visit  Medication Sig Dispense Refill   ALPRAZolam (XANAX) 0.25 MG tablet TAKE 1 TABLET BY MOUTH TWICE DAILY AS NEEDED FOR ANXIETY 30 tablet 0   Blood Glucose Monitoring Suppl DEVI 1 each by Does not apply route in the morning, at noon, and at bedtime. May substitute to any manufacturer covered by patient's insurance. 1 each 0   CALCIUM-MAGNESIUM-ZINC PO Take by mouth daily.     cetirizine (ZYRTEC) 10 MG tablet Take 10 mg by mouth at bedtime.     Dulaglutide (TRULICITY) 0.75 MG/0.5ML SOPN Inject 0.75 mg into the skin once a week. 2 mL 5   levothyroxine (SYNTHROID)  88 MCG tablet Take 1 tablet (88 mcg total) by mouth daily. 30 tablet 3   Loteprednol Etabonate (EYSUVIS OP) Apply to eye as needed.     Melatonin 10 MG TABS Take 10 mg by mouth at bedtime.     methocarbamol (ROBAXIN) 500 MG tablet Take 1 tablet (500 mg total) by mouth 2 (two) times daily as needed for muscle spasms. 60 tablet 1   Multiple Vitamins-Minerals (CENTRUM SILVER 50+WOMEN PO) Take 1 tablet by mouth daily.     nystatin ointment (MYCOSTATIN) Apply 1 application topically 2  (two) times daily. 30 g 0   pantoprazole (PROTONIX) 40 MG tablet TAKE 1 TABLET TWICE DAILY  BEFORE MEALS. 180 tablet 3   Perfluorohexyloctane (MIEBO) 1.338 GM/ML SOLN Apply 1 drop to eye 4 (four) times daily as needed (dry eyes).     triamcinolone cream (KENALOG) 0.1 % Apply 1 Application topically as needed. 15 g 0   metFORMIN (GLUCOPHAGE) 500 MG tablet Take 1 tablet (500 mg total) by mouth daily with breakfast.     pregabalin (LYRICA) 25 MG capsule TAKE 1 CAPSULE BY MOUTH AT BEDTIME FOR SEVEN DAYS, THEN 1 TWICE DAILY 60 capsule 5   Iron, Ferrous Sulfate, 325 (65 Fe) MG TABS Take 325 mg by mouth every Monday, Wednesday, and Friday. (Patient not taking: Reported on 01/07/2023)     No facility-administered medications prior to visit.     Per HPI unless specifically indicated in ROS section below Review of Systems  Objective:  BP 116/70   Pulse 63   Temp (!) 97 F (36.1 C) (Temporal)   Ht 5\' 10"  (1.778 m)   Wt 181 lb 4 oz (82.2 kg)   LMP 06/27/2019   SpO2 96%   BMI 26.01 kg/m   Wt Readings from Last 3 Encounters:  01/07/23 181 lb 4 oz (82.2 kg)  11/05/22 190 lb 4 oz (86.3 kg)  10/08/22 197 lb 2 oz (89.4 kg)      Physical Exam Vitals and nursing note reviewed. Exam conducted with a chaperone present.  Constitutional:      Appearance: Normal appearance. She is not ill-appearing.  HENT:     Head: Normocephalic and atraumatic.     Mouth/Throat:     Mouth: Mucous membranes are moist.     Pharynx: Oropharynx is clear. No oropharyngeal exudate or posterior oropharyngeal erythema.  Eyes:     Extraocular Movements: Extraocular movements intact.     Pupils: Pupils are equal, round, and reactive to light.  Cardiovascular:     Rate and Rhythm: Normal rate and regular rhythm.     Pulses: Normal pulses.     Heart sounds: Normal heart sounds. No murmur heard. Pulmonary:     Effort: Pulmonary effort is normal. No respiratory distress.     Breath sounds: Normal breath sounds. No  wheezing, rhonchi or rales.  Abdominal:     General: Bowel sounds are normal. There is no distension.     Palpations: Abdomen is soft. There is no mass.     Tenderness: There is no abdominal tenderness. There is no right CVA tenderness, left CVA tenderness, guarding or rebound.     Hernia: No hernia is present. There is no hernia in the left inguinal area or right inguinal area.  Genitourinary:    Exam position: Supine.     Pubic Area: Rash (erythematous macular rash to anterior pubic region) present.     Labia:        Right: No rash, tenderness  or lesion.        Left: No rash, tenderness or lesion.     Musculoskeletal:     Right lower leg: No edema.     Left lower leg: No edema.  Lymphadenopathy:     Lower Body: No right inguinal adenopathy. No left inguinal adenopathy.  Skin:    General: Skin is warm and dry.     Findings: Rash (see GYN section) present.  Neurological:     Mental Status: She is alert.  Psychiatric:        Mood and Affect: Mood normal.        Behavior: Behavior normal.       Results for orders placed or performed in visit on 01/07/23  TSH  Result Value Ref Range   TSH 0.41 0.35 - 5.50 uIU/mL  POCT Urinalysis Dipstick (Automated)  Result Value Ref Range   Color, UA yellow    Clarity, UA clear    Glucose, UA Negative Negative   Bilirubin, UA negative    Ketones, UA negative    Spec Grav, UA 1.015 1.010 - 1.025   Blood, UA 3+    pH, UA 6.0 5.0 - 8.0   Protein, UA Negative Negative   Urobilinogen, UA 0.2 0.2 or 1.0 E.U./dL   Nitrite, UA negative    Leukocytes, UA Negative Negative  POCT glycosylated hemoglobin (Hb A1C)  Result Value Ref Range   Hemoglobin A1C 5.3 4.0 - 5.6 %   HbA1c POC (<> result, manual entry)     HbA1c, POC (prediabetic range)     HbA1c, POC (controlled diabetic range)    Microscopy:  WBC rare  RBC 10-20  Bact tr  Casts none  Epi rare  UCx sent   Assessment & Plan:   Problem List Items Addressed This Visit      Hypothyroidism due to Hashimoto's thyroiditis    Last visit we switched from armour thyroid 60mg  to levothyroxine daily.  Update TSH on new med.       Relevant Orders   TSH (Completed)   Peripheral neuropathy    Ongoing - will slowly taper lyrica up to 50mg  bid. If worsening headache, then possibly med related side effect.       Relevant Medications   pregabalin (LYRICA) 50 MG capsule   Type 2 diabetes mellitus with other specified complication (HCC)    Chronic, great control on trulicity 0.75mg  weekly.  She stopped metformin last week - will remain off this.       Relevant Orders   POCT glycosylated hemoglobin (Hb A1C) (Completed)   Menopause   Sjogren's disease (HCC)   Dysuria - Primary    Notes predominantly external burning with urination associated with vulvitis and tender rash to pubic area without significant vulvovaginitis - UA abnormal as per below.  Rx bactrim DS 3d course for possible UTI, Rx diflucan for possible yeast infection component. Update if not improved with treatment.       Microhematuria    Significant blood noted in UA and microscopy (10-20 RBC/hpf).  She is post-menopausal.  Send UCx.  No smoking history, no fmhx bladder or renal cancers, no personal history of kidney stones.  Discussed if UCx negative, will proceed with workup for hematuria with CT urogram and urology referral for cystoscopy. If UCx positive for infection, rec rpt UA in 1-2 wks to ensure hematuria has resolved.  Pta grees with plan.       Skin rash    Skin rash  to pubic area - tender and itchy, causing external burning with urination.  Rx diflucan for possible yeast infection, update if ongoing symptoms.       Other Visit Diagnoses     Foul smelling urine       Relevant Orders   POCT Urinalysis Dipstick (Automated) (Completed)   Urine Culture        Meds ordered this encounter  Medications   pregabalin (LYRICA) 50 MG capsule    Sig: Take 1 capsule (50 mg total)  by mouth 2 (two) times daily.    Dispense:  60 capsule    Refill:  3   sulfamethoxazole-trimethoprim (BACTRIM DS) 800-160 MG tablet    Sig: Take 1 tablet by mouth 2 (two) times daily.    Dispense:  6 tablet    Refill:  0   fluconazole (DIFLUCAN) 150 MG tablet    Sig: Take 1 tablet (150 mg total) by mouth once for 1 dose. May repeat in 4 days if needed    Dispense:  2 tablet    Refill:  0    Orders Placed This Encounter  Procedures   Urine Culture   TSH   POCT Urinalysis Dipstick (Automated)   POCT glycosylated hemoglobin (Hb A1C)    Patient Instructions  A1c today, urinalysis today, urine culture sent off as well. Thyroid check today Urine with blood ?infection related vs other.  Start antibiotic bactrim twice daily for 3 days, schedule nurse visit in 2 weeks to repeat urine test. May take diflucan after antibiotics.  If culture negative, I will order CT scan for further evaluation of blood in urine and refer you to urologist.  Continue current medicines, ok to try tapering lyrica dose to 25mg /50mg  daily for 1 week then increase to 50mg  twice daily, let us know how you do wit this.  Return in 3 months for follow up visit   Follow up plan: Return in about 3 months (around 04/09/2023), or if symptoms worsen or fail to improve, for follow up visit.  Eustaquio Boyden, MD

## 2023-01-08 DIAGNOSIS — R21 Rash and other nonspecific skin eruption: Secondary | ICD-10-CM | POA: Insufficient documentation

## 2023-01-08 DIAGNOSIS — R3129 Other microscopic hematuria: Secondary | ICD-10-CM | POA: Insufficient documentation

## 2023-01-08 DIAGNOSIS — R300 Dysuria: Secondary | ICD-10-CM | POA: Insufficient documentation

## 2023-01-08 LAB — URINE CULTURE
MICRO NUMBER:: 14953913
Result:: NO GROWTH
SPECIMEN QUALITY:: ADEQUATE

## 2023-01-08 NOTE — Assessment & Plan Note (Signed)
Notes predominantly external burning with urination associated with vulvitis and tender rash to pubic area without significant vulvovaginitis - UA abnormal as per below.  Rx bactrim DS 3d course for possible UTI, Rx diflucan for possible yeast infection component. Update if not improved with treatment.

## 2023-01-08 NOTE — Assessment & Plan Note (Signed)
Significant blood noted in UA and microscopy (10-20 RBC/hpf).  She is post-menopausal.  Send UCx.  No smoking history, no fmhx bladder or renal cancers, no personal history of kidney stones.  Discussed if UCx negative, will proceed with workup for hematuria with CT urogram and urology referral for cystoscopy. If UCx positive for infection, rec rpt UA in 1-2 wks to ensure hematuria has resolved.  Pta grees with plan.

## 2023-01-08 NOTE — Assessment & Plan Note (Signed)
Ongoing - will slowly taper lyrica up to 50mg  bid. If worsening headache, then possibly med related side effect.

## 2023-01-08 NOTE — Assessment & Plan Note (Signed)
Chronic, great control on trulicity 0.75mg  weekly.  She stopped metformin last week - will remain off this.

## 2023-01-08 NOTE — Assessment & Plan Note (Signed)
Skin rash to pubic area - tender and itchy, causing external burning with urination.  Rx diflucan for possible yeast infection, update if ongoing symptoms.

## 2023-01-08 NOTE — Assessment & Plan Note (Signed)
Last visit we switched from armour thyroid 60mg  to levothyroxine daily.  Update TSH on new med.

## 2023-01-09 ENCOUNTER — Other Ambulatory Visit: Payer: Self-pay | Admitting: Family Medicine

## 2023-01-09 DIAGNOSIS — R3129 Other microscopic hematuria: Secondary | ICD-10-CM

## 2023-01-13 ENCOUNTER — Encounter: Payer: Self-pay | Admitting: *Deleted

## 2023-01-16 ENCOUNTER — Encounter: Payer: Self-pay | Admitting: Family Medicine

## 2023-01-16 ENCOUNTER — Encounter: Payer: Self-pay | Admitting: *Deleted

## 2023-01-16 DIAGNOSIS — F329 Major depressive disorder, single episode, unspecified: Secondary | ICD-10-CM | POA: Diagnosis not present

## 2023-01-23 ENCOUNTER — Other Ambulatory Visit: Payer: Self-pay | Admitting: Family Medicine

## 2023-01-23 DIAGNOSIS — F325 Major depressive disorder, single episode, in full remission: Secondary | ICD-10-CM

## 2023-01-23 NOTE — Telephone Encounter (Signed)
Name of Medication: Alprazolam Name of Pharmacy: [facility] Last Fill or Written Date and Quantity: 11/13/22, #30 Last Office Visit and Type: 01/07/23, 2 mo DM & thyroid f/u Next Office Visit and Type: none Last Controlled Substance Agreement Date: 06/28/14 Last UDS: 06/28/14

## 2023-01-24 NOTE — Telephone Encounter (Signed)
ERx 

## 2023-01-29 DIAGNOSIS — F329 Major depressive disorder, single episode, unspecified: Secondary | ICD-10-CM | POA: Diagnosis not present

## 2023-02-10 ENCOUNTER — Encounter: Payer: Self-pay | Admitting: Family Medicine

## 2023-02-10 DIAGNOSIS — R3129 Other microscopic hematuria: Secondary | ICD-10-CM

## 2023-02-17 DIAGNOSIS — F329 Major depressive disorder, single episode, unspecified: Secondary | ICD-10-CM | POA: Diagnosis not present

## 2023-02-18 NOTE — Telephone Encounter (Signed)
Plz see pt mychart message. CT scan was denied for hematuria. Can we resubmit? Indication for CT urogram is lower urinary tract symptoms with hematuria with negative culture. Thanks.  If again denied, will recommend she schedule urology appointment for follow up.

## 2023-02-18 NOTE — Telephone Encounter (Signed)
  PENDING MEDICAL REVIEW   Clinicals submitted to [employer] to try to override Denial   Case [id number]   See Referral order notes for updates

## 2023-02-19 DIAGNOSIS — L309 Dermatitis, unspecified: Secondary | ICD-10-CM | POA: Diagnosis not present

## 2023-02-19 DIAGNOSIS — L718 Other rosacea: Secondary | ICD-10-CM | POA: Diagnosis not present

## 2023-03-05 ENCOUNTER — Ambulatory Visit: Payer: 59 | Admitting: Family Medicine

## 2023-03-05 ENCOUNTER — Encounter: Payer: Self-pay | Admitting: Family Medicine

## 2023-03-05 VITALS — BP 118/74 | HR 76 | Temp 97.4°F | Ht 70.0 in | Wt 180.1 lb

## 2023-03-05 DIAGNOSIS — E038 Other specified hypothyroidism: Secondary | ICD-10-CM

## 2023-03-05 DIAGNOSIS — M2550 Pain in unspecified joint: Secondary | ICD-10-CM

## 2023-03-05 DIAGNOSIS — E063 Autoimmune thyroiditis: Secondary | ICD-10-CM

## 2023-03-05 DIAGNOSIS — R3129 Other microscopic hematuria: Secondary | ICD-10-CM | POA: Diagnosis not present

## 2023-03-05 DIAGNOSIS — M3509 Sicca syndrome with other organ involvement: Secondary | ICD-10-CM | POA: Diagnosis not present

## 2023-03-05 LAB — POC URINALSYSI DIPSTICK (AUTOMATED)
Bilirubin, UA: NEGATIVE
Blood, UA: NEGATIVE
Glucose, UA: NEGATIVE
Ketones, UA: NEGATIVE
Leukocytes, UA: NEGATIVE
Nitrite, UA: NEGATIVE
Protein, UA: NEGATIVE
Spec Grav, UA: 1.025 (ref 1.010–1.025)
Urobilinogen, UA: 0.2 E.U./dL
pH, UA: 6 (ref 5.0–8.0)

## 2023-03-05 NOTE — Patient Instructions (Addendum)
Repeat urinalysis today Labs today  We will be in touch with results.

## 2023-03-05 NOTE — Assessment & Plan Note (Addendum)
Significant blood on latest UA. She wondered if vulvar dermatitis may have contributed.  Post-menopausal. Non smoker.  Was having UTI symptoms at that time but UCx was negative. Regardless did receive treatment with Bactrim.  Repeat UA today - no blood. Will monitor for now, recommend recheck UA at next visit.

## 2023-03-05 NOTE — Progress Notes (Unsigned)
Ph: [phone] Fax: [phone]   Patient ID: [name], female    DOB: 02-28-1961, 62 y.o.   MRN: [id number]  This visit was conducted in person.  BP 118/74   Pulse 76   Temp (!) 97.4 F (36.3 C) (Temporal)   Ht 5\' 10"  (1.778 m)   Wt 180 lb 2 oz (81.7 kg)   LMP 06/27/2019   SpO2 100%   BMI 25.85 kg/m    CC: worsening joint pains  Subjective:   HPI: [name] is a 62 y.o. female presenting on 03/05/2023 for Joint Pain (C/o all over joint pain. Started about 1 mo ago. )   1 mo h/o worsening body ache - "all joints" as well as myalgias to forearms. L elbow pain/swelling ongoing for 9 months, as well as swelling to bilateral PIP knuckles (trouble taking ring off). She notes decreased temperature discrimination to tips of fingers.  No redness or warmth of joints.  No fevers/chills, red eyes, oral lesions, other skin rash.  Notes ongoing exertional dyspnea - chronic.   Recently diagnosed with Rosacea - now on topical ointment ?metrogel.  Known sjogren syndrome - notes eyes are really dry.   Currently taking Armour thyroid for hypothyroidism.  H/o hematuria - did not complete CT - as insurance didn't cover this. Due for rpt UA.  No urinary symptoms at this time     Relevant past medical, surgical, family and social history reviewed and updated as indicated. Interim medical history since our last visit reviewed. Allergies and medications reviewed and updated. Outpatient Medications Prior to Visit  Medication Sig Dispense Refill   ALPRAZolam (XANAX) 0.25 MG tablet TAKE 1 TABLET BY MOUTH TWICE DAILY AS NEEDED FOR ANXIETY 30 tablet 0   ARMOUR THYROID 60 MG tablet Take 60 mg by mouth every morning.     Blood Glucose Monitoring Suppl DEVI 1 each by Does not apply route in the morning, at noon, and at bedtime. May substitute to any manufacturer covered by patient's insurance. 1 each 0   CALCIUM-MAGNESIUM-ZINC PO Take by mouth daily.     cetirizine (ZYRTEC) 10  MG tablet Take 10 mg by mouth at bedtime.     Dulaglutide (TRULICITY) 0.75 MG/0.5ML SOPN Inject 0.75 mg into the skin once a week. 2 mL 5   Iron, Ferrous Sulfate, 325 (65 Fe) MG TABS Take 325 mg by mouth every Monday, Wednesday, and Friday.     Loteprednol Etabonate (EYSUVIS OP) Apply to eye as needed.     Melatonin 10 MG TABS Take 10 mg by mouth at bedtime.     methocarbamol (ROBAXIN) 500 MG tablet Take 1 tablet (500 mg total) by mouth 2 (two) times daily as needed for muscle spasms. 60 tablet 1   metroNIDAZOLE (METROCREAM) 0.75 % cream Apply topically 2 (two) times daily.     Multiple Vitamins-Minerals (CENTRUM SILVER 50+WOMEN PO) Take 1 tablet by mouth daily.     nystatin ointment (MYCOSTATIN) Apply 1 application topically 2 (two) times daily. 30 g 0   pantoprazole (PROTONIX) 40 MG tablet TAKE 1 TABLET TWICE DAILY  BEFORE MEALS. 180 tablet 3   Perfluorohexyloctane (MIEBO) 1.338 GM/ML SOLN Apply 1 drop to eye 4 (four) times daily as needed (dry eyes).     pregabalin (LYRICA) 50 MG capsule Take 1 capsule (50 mg total) by mouth 2 (two) times daily. 60 capsule 3   triamcinolone cream (KENALOG) 0.1 % Apply 1 Application topically as needed. 15 g 0  levothyroxine (SYNTHROID) 88 MCG tablet Take 1 tablet (88 mcg total) by mouth daily. 30 tablet 3   sulfamethoxazole-trimethoprim (BACTRIM DS) 800-160 MG tablet Take 1 tablet by mouth 2 (two) times daily. 6 tablet 0   No facility-administered medications prior to visit.     Per HPI unless specifically indicated in ROS section below Review of Systems  Objective:  BP 118/74   Pulse 76   Temp (!) 97.4 F (36.3 C) (Temporal)   Ht 5\' 10"  (1.778 m)   Wt 180 lb 2 oz (81.7 kg)   LMP 06/27/2019   SpO2 100%   BMI 25.85 kg/m   Wt Readings from Last 3 Encounters:  03/05/23 180 lb 2 oz (81.7 kg)  01/07/23 181 lb 4 oz (82.2 kg)  11/05/22 190 lb 4 oz (86.3 kg)      Physical Exam Vitals and nursing note reviewed.  Constitutional:      Appearance:  Normal appearance. She is not ill-appearing.  HENT:     Head: Normocephalic and atraumatic.     Mouth/Throat:     Mouth: Mucous membranes are moist.     Pharynx: Oropharynx is clear. No oropharyngeal exudate or posterior oropharyngeal erythema.  Eyes:     General:        Right eye: No discharge.        Left eye: No discharge.     Extraocular Movements: Extraocular movements intact.     Conjunctiva/sclera: Conjunctivae normal.     Pupils: Pupils are equal, round, and reactive to light.  Cardiovascular:     Rate and Rhythm: Normal rate and regular rhythm.     Pulses: Normal pulses.     Heart sounds: Normal heart sounds. No murmur heard. Pulmonary:     Effort: Pulmonary effort is normal. No respiratory distress.     Breath sounds: Normal breath sounds. No wheezing, rhonchi or rales.  Abdominal:     General: Bowel sounds are normal. There is no distension.     Palpations: Abdomen is soft. There is no mass.     Tenderness: There is no abdominal tenderness. There is no guarding or rebound.     Hernia: No hernia is present.  Musculoskeletal:     Right lower leg: No edema.     Left lower leg: No edema.     Comments:  No active synovitis No pain midline spine No paraspinous mm tenderness No pain at SIJ, GTB or sciatic notch bilaterally.   Skin:    General: Skin is warm and dry.     Findings: No rash.  Neurological:     Mental Status: She is alert.  Psychiatric:        Mood and Affect: Mood normal.        Behavior: Behavior normal.       Results for orders placed or performed in visit on 03/05/23  Rheumatoid factor  Result Value Ref Range   Rheumatoid fact SerPl-aCnc <10 <14 IU/mL  C3 and C4  Result Value Ref Range   C3 Complement 145 83 - 193 mg/dL   C4 Complement 26 15 - 57 mg/dL  POCT Urinalysis Dipstick (Automated)  Result Value Ref Range   Color, UA yellow    Clarity, UA clear    Glucose, UA Negative Negative   Bilirubin, UA negative    Ketones, UA negative     Spec Grav, UA 1.025 1.010 - 1.025   Blood, UA negative    pH, UA 6.0 5.0 - 8.0  Protein, UA Negative Negative   Urobilinogen, UA 0.2 0.2 or 1.0 E.U./dL   Nitrite, UA negative    Leukocytes, UA Negative Negative    Assessment & Plan:   Problem List Items Addressed This Visit     Hypothyroidism due to Hashimoto's thyroiditis    Levothyroxine may have caused headache (2024).  Now back on Armour Thyroid  60mg  daily.       Relevant Medications   ARMOUR THYROID 60 MG tablet   Polyarthralgia - Primary    Worsening polyarthralgias to upper and lower extremities, lower back, over the past month, although seems to be improving over the past few days.  No other autoimmune symptoms of conjunctivitis, oral lesions, new rash, abd pain.  Will further evaluate with labwork including ANA, inflammatory markers, SLE and RA eval.  Update if not continuing to improve over time.       Relevant Orders   Sedimentation rate   Rheumatoid factor (Completed)   Cyclic citrul peptide antibody, IgG   C-reactive protein   ANA   Anti-DNA antibody, double-stranded   C3 and C4 (Completed)   CK   Sjogren's disease (HCC)    Followed by rheumatology [facility]).  Now with 1 month of worsening polyarthralgia and myalgia, will further evaluate as above for associated conditions (ie rheumatoid arthritis, SLE. etc).  Discussed Sjogren's can cause neuropathy.       Relevant Orders   Sedimentation rate   Rheumatoid factor (Completed)   Cyclic citrul peptide antibody, IgG   C-reactive protein   ANA   Anti-DNA antibody, double-stranded   C3 and C4 (Completed)   CK   Microhematuria    Significant blood on latest UA. She wondered if vulvar dermatitis may have contributed.  Post-menopausal. Non smoker.  Was having UTI symptoms at that time but UCx was negative. Regardless did receive treatment with Bactrim.  Repeat UA today - no blood noted. On microscopic review 0-2 RBC/hpf. Will monitor for now,  recommend recheck UA at next visit.  Never completed CT hematuria protocol ordered in May - I will ask my referral coordinators to follow up on this.       Relevant Orders   POCT Urinalysis Dipstick (Automated) (Completed)     No orders of the defined types were placed in this encounter.   Orders Placed This Encounter  Procedures   Sedimentation rate   Rheumatoid factor   Cyclic citrul peptide antibody, IgG   C-reactive protein   ANA   Anti-DNA antibody, double-stranded   C3 and C4   CK   POCT Urinalysis Dipstick (Automated)    Patient Instructions  Repeat urinalysis today Labs today  We will be in touch with results.   Follow up plan: Return if symptoms worsen or fail to improve.  Eustaquio Boyden, MD

## 2023-03-06 ENCOUNTER — Encounter: Payer: Self-pay | Admitting: Family Medicine

## 2023-03-06 DIAGNOSIS — F329 Major depressive disorder, single episode, unspecified: Secondary | ICD-10-CM | POA: Diagnosis not present

## 2023-03-06 LAB — CK: Total CK: 94 U/L (ref 7–177)

## 2023-03-06 LAB — SEDIMENTATION RATE: Sed Rate: 15 mm/hr (ref 0–30)

## 2023-03-06 LAB — C-REACTIVE PROTEIN: CRP: <1 mg/dL (ref 0.5–20.0)

## 2023-03-06 NOTE — Assessment & Plan Note (Signed)
Followed by rheumatology [facility]).  Now with 1 month of worsening polyarthralgia and myalgia, will further evaluate as above for associated conditions (ie rheumatoid arthritis, SLE. etc).  Discussed Sjogren's can cause neuropathy.

## 2023-03-06 NOTE — Telephone Encounter (Signed)
Can we follow up on this? I don't seen updates on referral order notes section.  Thanks

## 2023-03-06 NOTE — Telephone Encounter (Signed)
I have tried twice to get this approved. I faxed additional clinical documents to the insurance and they still denied it.    I attempted to resubmit the case all over but it will not allow me to get this approved due to having the same type request denied within 30 days.    Can you place a new order with new diagnosis codes attached? this CT would not approve with the diagnosis codes of "microhematuria ".    Once the new order if placed I can attempt precert again.    Thanks! -Nadara Eaton, [facility] Referral Coordinator

## 2023-03-06 NOTE — Assessment & Plan Note (Signed)
Worsening polyarthralgias to upper and lower extremities, lower back, over the past month, although seems to be improving over the past few days.  No other autoimmune symptoms of conjunctivitis, oral lesions, new rash, abd pain.  Will further evaluate with labwork including ANA, inflammatory markers, SLE and RA eval.  Update if not continuing to improve over time.

## 2023-03-06 NOTE — Assessment & Plan Note (Signed)
Levothyroxine may have caused headache (2024).  Now back on Armour Thyroid  60mg  daily.

## 2023-03-07 LAB — C3 AND C4
C3 Complement: 145 mg/dL (ref 83–193)
C4 Complement: 26 mg/dL (ref 15–57)

## 2023-03-07 LAB — ANTI-DNA ANTIBODY, DOUBLE-STRANDED: ds DNA Ab: 1 IU/mL

## 2023-03-07 LAB — RHEUMATOID FACTOR: Rheumatoid fact SerPl-aCnc: <10 IU/mL (ref <14)

## 2023-03-07 LAB — ANTI-NUCLEAR AB-TITER (ANA TITER): ANA Titer 1: 1:80 {titer} — ABNORMAL HIGH

## 2023-03-07 LAB — ANA: Anti Nuclear Antibody (ANA): POSITIVE — AB

## 2023-03-09 LAB — ANTI-NUCLEAR AB-TITER (ANA TITER)

## 2023-03-09 LAB — CYCLIC CITRUL PEPTIDE ANTIBODY, IGG: Cyclic Citrullin Peptide Ab: <16 UNITS

## 2023-03-14 ENCOUNTER — Encounter: Payer: Self-pay | Admitting: Family Medicine

## 2023-03-18 DIAGNOSIS — F329 Major depressive disorder, single episode, unspecified: Secondary | ICD-10-CM | POA: Diagnosis not present

## 2023-03-22 DIAGNOSIS — M170 Bilateral primary osteoarthritis of knee: Secondary | ICD-10-CM | POA: Diagnosis not present

## 2023-03-22 DIAGNOSIS — M25522 Pain in left elbow: Secondary | ICD-10-CM | POA: Diagnosis not present

## 2023-04-03 DIAGNOSIS — F329 Major depressive disorder, single episode, unspecified: Secondary | ICD-10-CM | POA: Diagnosis not present

## 2023-04-15 ENCOUNTER — Encounter: Payer: Self-pay | Admitting: Family Medicine

## 2023-04-15 ENCOUNTER — Ambulatory Visit: Payer: 59 | Admitting: Family Medicine

## 2023-04-15 VITALS — BP 118/68 | HR 84 | Temp 97.8°F | Ht 70.0 in | Wt 176.8 lb

## 2023-04-15 DIAGNOSIS — R519 Headache, unspecified: Secondary | ICD-10-CM | POA: Diagnosis not present

## 2023-04-15 DIAGNOSIS — F411 Generalized anxiety disorder: Secondary | ICD-10-CM

## 2023-04-15 DIAGNOSIS — F325 Major depressive disorder, single episode, in full remission: Secondary | ICD-10-CM | POA: Diagnosis not present

## 2023-04-15 DIAGNOSIS — M3509 Sicca syndrome with other organ involvement: Secondary | ICD-10-CM

## 2023-04-15 MED ORDER — DOXYCYCLINE HYCLATE 100 MG PO TABS: 100.0000 mg | ORAL_TABLET | Freq: Two times a day (BID) | ORAL | 0 refills | Status: DC

## 2023-04-15 MED ORDER — ESCITALOPRAM OXALATE 5 MG PO TABS
5.0000 mg | ORAL_TABLET | Freq: Every day | ORAL | 6 refills | Status: DC
Start: 2023-04-15 — End: 2023-05-09

## 2023-04-15 NOTE — Assessment & Plan Note (Signed)
Anxiety > depression - see above.

## 2023-04-15 NOTE — Assessment & Plan Note (Signed)
Acute worsening over the past several months.  She is already on xanax PRN with benefit.  She would benefit from daily anxiety medication. Discussed options - SNRI vs SSRI - previously didn't do great with Cymbalta. Will start lexapro 5mg  daily in am. Discussed need to take regularly for 1 month prior to reassessing effect.

## 2023-04-15 NOTE — Progress Notes (Signed)
Ph: [phone] Fax: [phone]   Patient ID: [name], female    DOB: 09-29-1960, 62 y.o.   MRN: [id number]  This visit was conducted in person.  BP 118/68   Pulse 84   Temp 97.8 F (36.6 C) (Temporal)   Ht 5\' 10"  (1.778 m)   Wt 176 lb 12.8 oz (80.2 kg)   LMP 06/27/2019   SpO2 97%   BMI 25.37 kg/m    CC: anxiety, headache, sores in nose Subjective:   HPI: [name] is a 62 y.o. female presenting on 04/15/2023 for Anxiety , Headache, Nasal Congestion, and Sores in nose    Worsening anxiety over the past 6 months despite xanax 0.25mg  BID PRN. Notes worsening impending sense of doom. Finds she's using xanax more regularly. Hydroxyzine didn't really help. H/o poor response to Cymbalta due to numbing of feelings - was tried for neuropathy. Wonders about lexapro use.  Anxiety may have started after losing both parents.   3 wk h/o intermittent headache associated with nasal and sinus congestion.  Not like typical migraine pain, no photo/phonophobia or nausea.  New sores in nose.  Significant home work being done (bathroom), exposed to [employer]chand[employer], retail spill. Possible mold exposure.  Has treated with flonase, nasal saline irrigation, occ benadryl and allegra with temporary benefit ongoing symptoms.   Known sjogren syndrome. Worsening polyarthralgias - recent labs showed mildly elevated ANA titers 1:80, CK, C3/C4, anti-DNA Ab, anti-CCP, RF and inflammatory markers returned normal. Recommended rheum f/u for worsening symptoms.      Relevant past medical, surgical, family and social history reviewed and updated as indicated. Interim medical history since our last visit reviewed. Allergies and medications reviewed and updated. Outpatient Medications Prior to Visit  Medication Sig Dispense Refill   ALPRAZolam (XANAX) 0.25 MG tablet TAKE 1 TABLET BY MOUTH TWICE DAILY AS NEEDED FOR ANXIETY 30 tablet 0   ARMOUR THYROID 60 MG tablet Take 60 mg by mouth every  morning.     Blood Glucose Monitoring Suppl DEVI 1 each by Does not apply route in the morning, at noon, and at bedtime. May substitute to any manufacturer covered by patient's insurance. 1 each 0   CALCIUM-MAGNESIUM-ZINC PO Take by mouth daily.     cetirizine (ZYRTEC) 10 MG tablet Take 10 mg by mouth at bedtime.     Dulaglutide (TRULICITY) 0.75 MG/0.5ML SOPN Inject 0.75 mg into the skin once a week. 2 mL 5   Loteprednol Etabonate (EYSUVIS OP) Apply to eye as needed.     methocarbamol (ROBAXIN) 500 MG tablet Take 1 tablet (500 mg total) by mouth 2 (two) times daily as needed for muscle spasms. 60 tablet 1   metroNIDAZOLE (METROCREAM) 0.75 % cream Apply topically 2 (two) times daily.     Multiple Vitamins-Minerals (CENTRUM SILVER 50+WOMEN PO) Take 1 tablet by mouth daily.     nystatin ointment (MYCOSTATIN) Apply 1 application topically 2 (two) times daily. 30 g 0   pantoprazole (PROTONIX) 40 MG tablet TAKE 1 TABLET TWICE DAILY  BEFORE MEALS. 180 tablet 3   Perfluorohexyloctane (MIEBO) 1.338 GM/ML SOLN Apply 1 drop to eye 4 (four) times daily as needed (dry eyes).     pregabalin (LYRICA) 50 MG capsule Take 1 capsule (50 mg total) by mouth 2 (two) times daily. 60 capsule 3   triamcinolone cream (KENALOG) 0.1 % Apply 1 Application topically as needed. 15 g 0   Iron, Ferrous Sulfate, 325 (65 Fe) MG TABS Take 325 mg by  mouth every Monday, Wednesday, and Friday. (Patient not taking: Reported on 04/15/2023)     Melatonin 10 MG TABS Take 10 mg by mouth at bedtime. (Patient not taking: Reported on 04/15/2023)     No facility-administered medications prior to visit.     Per HPI unless specifically indicated in ROS section below Review of Systems  Objective:  BP 118/68   Pulse 84   Temp 97.8 F (36.6 C) (Temporal)   Ht 5\' 10"  (1.778 m)   Wt 176 lb 12.8 oz (80.2 kg)   LMP 06/27/2019   SpO2 97%   BMI 25.37 kg/m   Wt Readings from Last 3 Encounters:  04/15/23 176 lb 12.8 oz (80.2 kg)  03/05/23 180  lb 2 oz (81.7 kg)  01/07/23 181 lb 4 oz (82.2 kg)      Physical Exam Vitals and nursing note reviewed.  Constitutional:      Appearance: Normal appearance. She is not ill-appearing.  HENT:     Head: Normocephalic and atraumatic.     Right Ear: Tympanic membrane, ear canal and external ear normal. There is no impacted cerumen.     Left Ear: Tympanic membrane, ear canal and external ear normal. There is no impacted cerumen.     Nose: Congestion present. No rhinorrhea.     Right Turbinates: Swollen. Not enlarged or pale.     Left Turbinates: Swollen. Not enlarged or pale.     Right Sinus: No maxillary sinus tenderness or frontal sinus tenderness.     Left Sinus: No maxillary sinus tenderness or frontal sinus tenderness.     Comments:  Possible R nasal polyp  Marked nasal congestion    Mouth/Throat:     Mouth: Mucous membranes are moist.     Pharynx: Oropharynx is clear. No oropharyngeal exudate or posterior oropharyngeal erythema.  Eyes:     Extraocular Movements: Extraocular movements intact.     Conjunctiva/sclera: Conjunctivae normal.     Pupils: Pupils are equal, round, and reactive to light.  Cardiovascular:     Rate and Rhythm: Normal rate and regular rhythm.     Pulses: Normal pulses.     Heart sounds: Normal heart sounds. No murmur heard. Pulmonary:     Effort: Pulmonary effort is normal. No respiratory distress.     Breath sounds: Normal breath sounds. No wheezing, rhonchi or rales.  Musculoskeletal:        General: Normal range of motion.     Right lower leg: No edema.     Left lower leg: No edema.  Lymphadenopathy:     Head:     Right side of head: No submental, submandibular, tonsillar, preauricular or posterior auricular adenopathy.     Left side of head: No submental, submandibular, tonsillar, preauricular or posterior auricular adenopathy.     Cervical: No cervical adenopathy.     Right cervical: No superficial cervical adenopathy.    Left cervical: No  superficial cervical adenopathy.     Upper Body:     Right upper body: No supraclavicular adenopathy.     Left upper body: No supraclavicular adenopathy.  Skin:    General: Skin is warm and dry.     Findings: No rash.  Neurological:     Mental Status: She is alert.  Psychiatric:        Mood and Affect: Mood normal.        Behavior: Behavior normal.       Results for orders placed or performed in visit on 03/05/23  Sedimentation rate  Result Value Ref Range   Sed Rate 15 0 - 30 mm/hr  Rheumatoid factor  Result Value Ref Range   Rheumatoid fact SerPl-aCnc <10 <14 IU/mL  Cyclic citrul peptide antibody, IgG  Result Value Ref Range   Cyclic Citrullin Peptide Ab <40 UNITS  C-reactive protein  Result Value Ref Range   CRP <1.0 0.5 - 20.0 mg/dL  ANA  Result Value Ref Range   Anti Nuclear Antibody (ANA) POSITIVE (A) NEGATIVE  Anti-DNA antibody, double-stranded  Result Value Ref Range   ds DNA Ab 1 IU/mL  C3 and C4  Result Value Ref Range   C3 Complement 145 83 - 193 mg/dL   C4 Complement 26 15 - 57 mg/dL  CK  Result Value Ref Range   Total CK 94 7 - 177 U/L  Anti-nuclear ab-titer (ANA titer)  Result Value Ref Range   ANA Titer 1 1:80 (H) titer   ANA Pattern 1 Nuclear, Multiple Nuclear Dots (A)   POCT Urinalysis Dipstick (Automated)  Result Value Ref Range   Color, UA yellow    Clarity, UA clear    Glucose, UA Negative Negative   Bilirubin, UA negative    Ketones, UA negative    Spec Grav, UA 1.025 1.010 - 1.025   Blood, UA negative    pH, UA 6.0 5.0 - 8.0   Protein, UA Negative Negative   Urobilinogen, UA 0.2 0.2 or 1.0 E.U./dL   Nitrite, UA negative    Leukocytes, UA Negative Negative      04/15/2023   12:06 PM 03/05/2023    2:35 PM 01/07/2023   10:49 AM 11/05/2022    3:07 PM 10/08/2022    9:57 AM  Depression screen PHQ 2/9  Decreased Interest 1 1 1 1 1   Down, Depressed, Hopeless 1 1 1 1 1   PHQ - 2 Score 2 2 2 2 2   Altered sleeping 1 1 0 1 1  Tired,  decreased energy 2 1 2 2 1   Change in appetite 0 0 0 1 1  Feeling bad or failure about yourself  1 1 1  0 1  Trouble concentrating 1 1 0 0 0  Moving slowly or fidgety/restless 2 0 0 0 0  Suicidal thoughts 0 0 0 0 0  PHQ-9 Score 9 6 5 6 6   Difficult doing work/chores Somewhat difficult Somewhat difficult Somewhat difficult Somewhat difficult Somewhat difficult       04/15/2023   12:06 PM 03/05/2023    2:36 PM 01/07/2023   10:49 AM 11/05/2022    3:07 PM  GAD 7 : Generalized Anxiety Score  Nervous, Anxious, on Edge 3 2 1  0  Control/stop worrying 3 2 0 0  Worry too much - different things 3 2 1  0  Trouble relaxing 2 1 0 0  Restless 0 0 0 0  Easily annoyed or irritable 2 1 0 0  Afraid - awful might happen 3 2 1 2   Total GAD 7 Score 16 10 3 2   Anxiety Difficulty Very difficult Somewhat difficult Not difficult at all Not difficult at all   Assessment & Plan:   Problem List Items Addressed This Visit     MDD (major depressive disorder), single episode, in full remission (HCC)    Anxiety > depression - see above.       Relevant Medications   escitalopram (LEXAPRO) 5 MG tablet   Generalized anxiety disorder - Primary    Acute worsening over the past several months.  She is already on xanax PRN with benefit.  She would benefit from daily anxiety medication. Discussed options - SNRI vs SSRI - previously didn't do great with Cymbalta. Will start lexapro 5mg  daily in am. Discussed need to take regularly for 1 month prior to reassessing effect.       Relevant Medications   escitalopram (LEXAPRO) 5 MG tablet   Sjogren's disease (HCC)   Sinus headache    Ongoing for 3 weeks, may have started as allergies or after mold and chemical exposure at home. Now with ongoing sinus pressure headache, neck tightness, marked nasal sinus congestion.  Exam with possible R nasal polyp.  Rx doxycycline 10d course, continue flonase.  Will hold antihistamine in sjogren's with marked dry mouth.  Discussed  returning to her ENT if not improving for further eval for possible nasal polyp.       Relevant Medications   escitalopram (LEXAPRO) 5 MG tablet     Meds ordered this encounter  Medications   escitalopram (LEXAPRO) 5 MG tablet    Sig: Take 1 tablet (5 mg total) by mouth daily.    Dispense:  30 tablet    Refill:  6   doxycycline (VIBRA-TABS) 100 MG tablet    Sig: Take 1 tablet (100 mg total) by mouth 2 (two) times daily.    Dispense:  20 tablet    Refill:  0    No orders of the defined types were placed in this encounter.   Patient Instructions  Start lexapro 5mg  daily in the mornings.  May continue xanax use as needed For sinus congestion - take doxycycline antibiotic sent to pharmacy. Continue flonase and nasal saline. If not better with this, schedule follow up with ENT for evaluation.  Good to see you today.  Follow up plan: Return if symptoms worsen or fail to improve.  Eustaquio Boyden, MD

## 2023-04-15 NOTE — Patient Instructions (Addendum)
Start lexapro 5mg  daily in the mornings.  May continue xanax use as needed For sinus congestion - take doxycycline antibiotic sent to pharmacy. Continue flonase and nasal saline. If not better with this, schedule follow up with ENT for evaluation.  Good to see you today.

## 2023-04-15 NOTE — Assessment & Plan Note (Signed)
Ongoing for 3 weeks, may have started as allergies or after mold and chemical exposure at home. Now with ongoing sinus pressure headache, neck tightness, marked nasal sinus congestion.  Exam with possible R nasal polyp.  Rx doxycycline 10d course, continue flonase.  Will hold antihistamine in sjogren's with marked dry mouth.  Discussed returning to her ENT if not improving for further eval for possible nasal polyp.

## 2023-04-17 DIAGNOSIS — F329 Major depressive disorder, single episode, unspecified: Secondary | ICD-10-CM | POA: Diagnosis not present

## 2023-04-29 ENCOUNTER — Other Ambulatory Visit: Payer: Self-pay | Admitting: Family Medicine

## 2023-04-29 DIAGNOSIS — F325 Major depressive disorder, single episode, in full remission: Secondary | ICD-10-CM

## 2023-04-30 NOTE — Telephone Encounter (Signed)
ERx 

## 2023-05-07 ENCOUNTER — Encounter: Payer: Self-pay | Admitting: Family Medicine

## 2023-05-07 DIAGNOSIS — F325 Major depressive disorder, single episode, in full remission: Secondary | ICD-10-CM

## 2023-05-07 DIAGNOSIS — F329 Major depressive disorder, single episode, unspecified: Secondary | ICD-10-CM | POA: Diagnosis not present

## 2023-05-13 DIAGNOSIS — F4323 Adjustment disorder with mixed anxiety and depressed mood: Secondary | ICD-10-CM | POA: Diagnosis not present

## 2023-05-26 DIAGNOSIS — F329 Major depressive disorder, single episode, unspecified: Secondary | ICD-10-CM | POA: Diagnosis not present

## 2023-05-28 ENCOUNTER — Other Ambulatory Visit: Payer: Self-pay

## 2023-05-28 DIAGNOSIS — M797 Fibromyalgia: Secondary | ICD-10-CM

## 2023-05-28 DIAGNOSIS — M62838 Other muscle spasm: Secondary | ICD-10-CM

## 2023-05-28 NOTE — Telephone Encounter (Signed)
Robaxin Last rx:  06/04/21, #60 Last OV:  04/15/23, anxiety Next OV:  none

## 2023-06-02 MED ORDER — METHOCARBAMOL 500 MG PO TABS
500.0000 mg | ORAL_TABLET | Freq: Two times a day (BID) | ORAL | 1 refills | Status: DC | PRN
Start: 2023-06-02 — End: 2024-04-30

## 2023-06-26 DIAGNOSIS — F329 Major depressive disorder, single episode, unspecified: Secondary | ICD-10-CM | POA: Diagnosis not present

## 2023-07-08 DIAGNOSIS — F329 Major depressive disorder, single episode, unspecified: Secondary | ICD-10-CM | POA: Diagnosis not present

## 2023-07-09 IMAGING — DX DG CHEST 2V
2 series · 2 of 2 positions shown · non-contrast
Comparison: 09/27/2020

CLINICAL DATA: Chest pain, shortness of breath

EXAM:
CHEST - 2 VIEW

[chest pa]
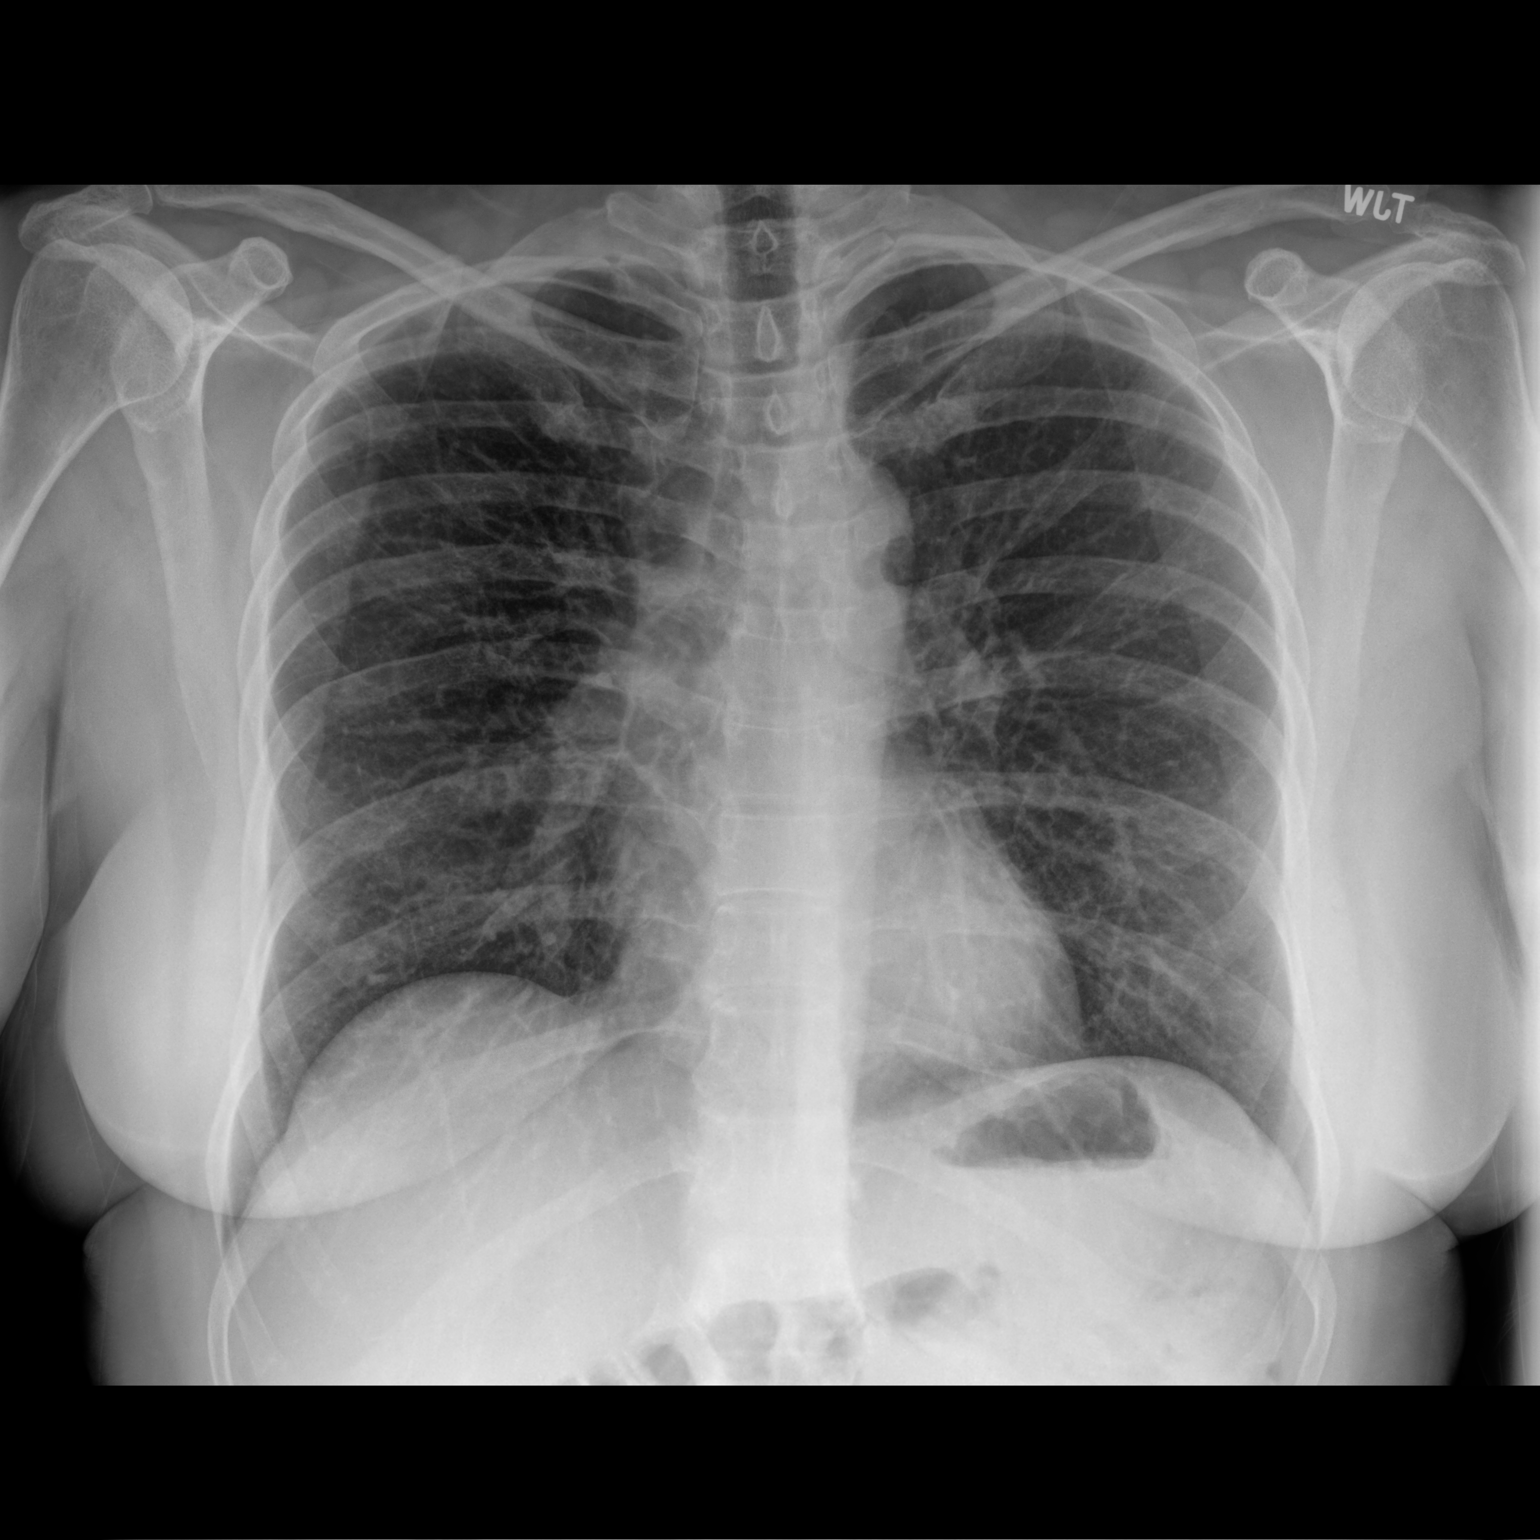

[chest lat]
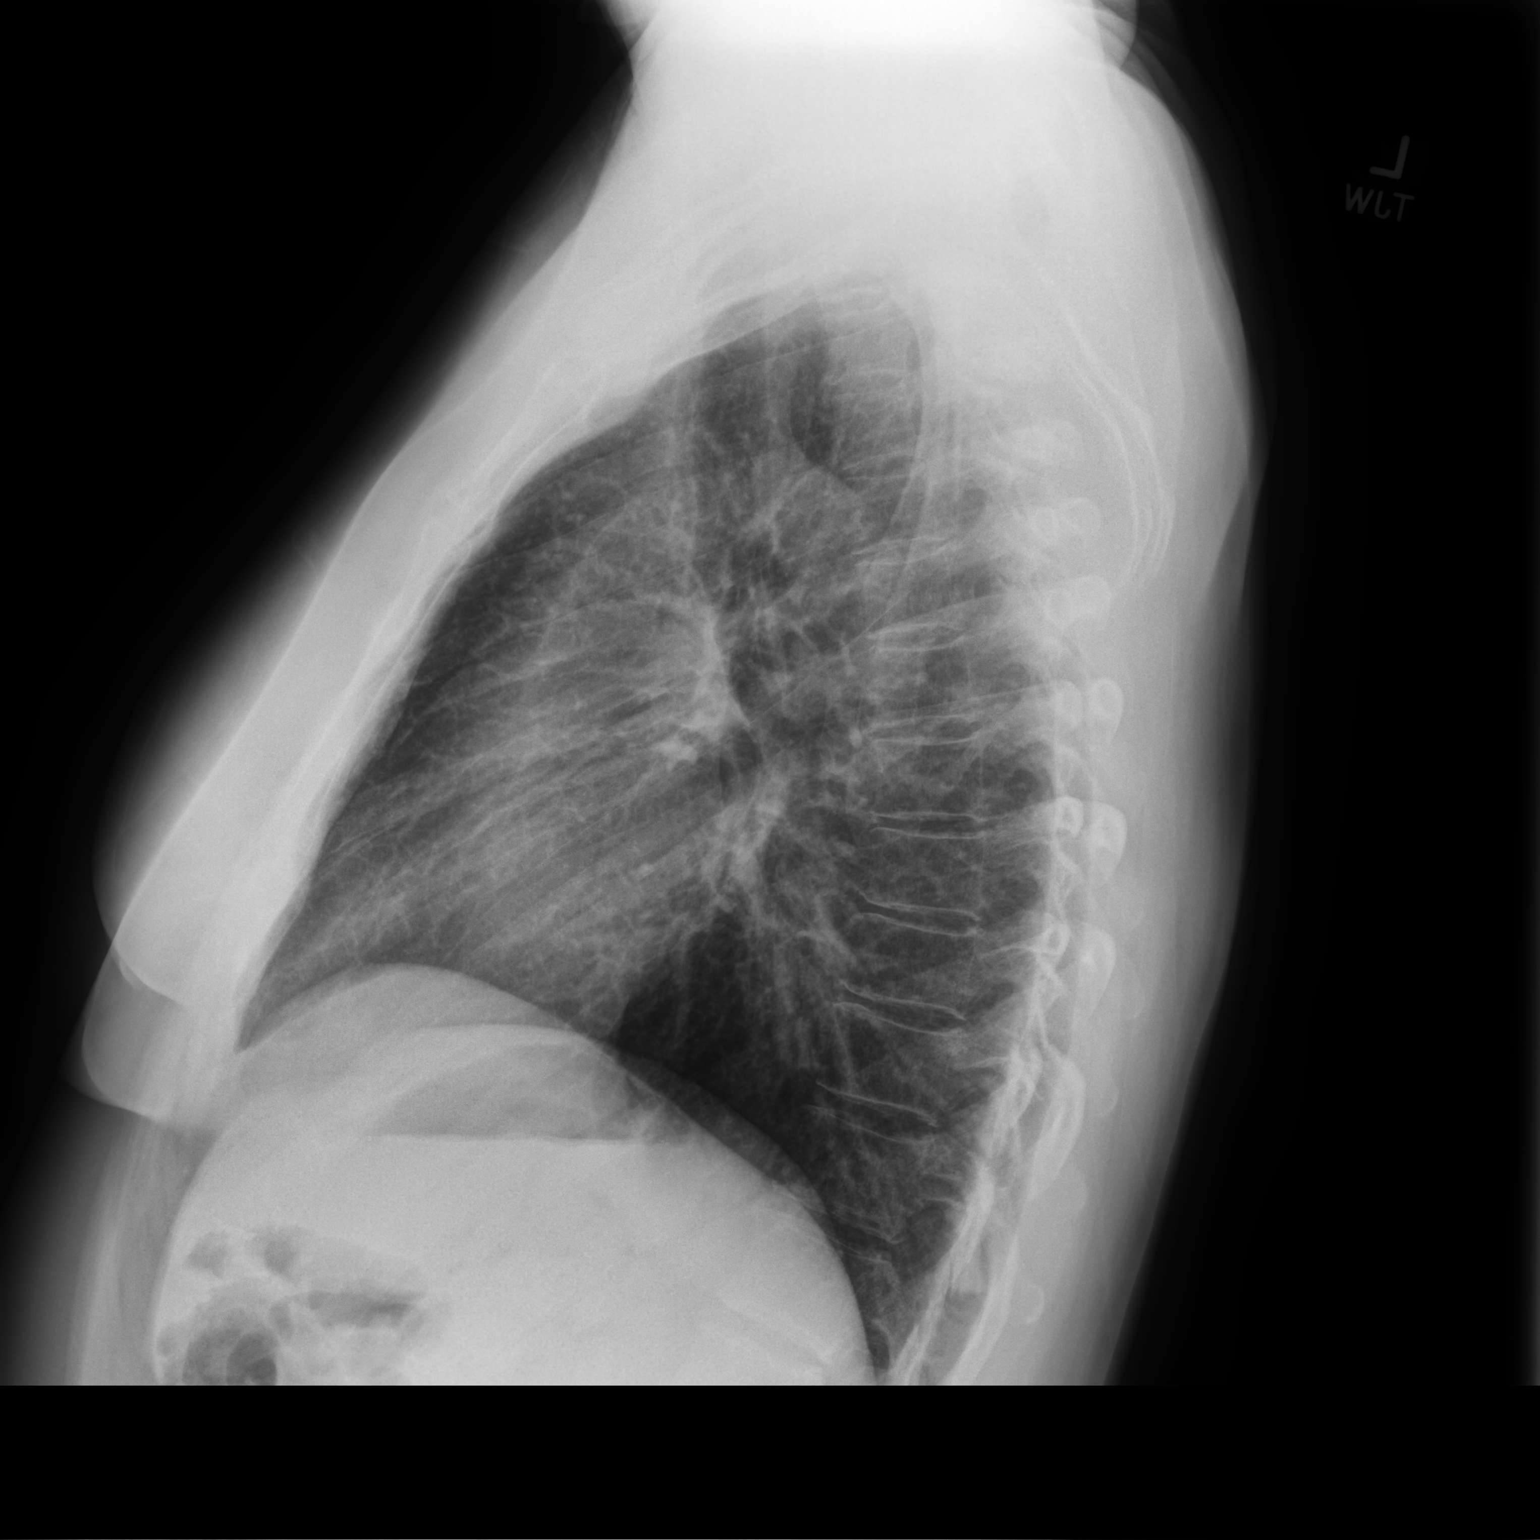

[2 of 2 positions shown; findings below may reference images not displayed]

FINDINGS: The heart size and mediastinal contours are within normal limits.
Both lungs are clear. The visualized skeletal structures are
unremarkable.
IMPRESSION: No active cardiopulmonary disease.

## 2023-07-17 ENCOUNTER — Encounter: Payer: Self-pay | Admitting: Internal Medicine

## 2023-07-23 ENCOUNTER — Telehealth: Payer: Self-pay | Admitting: Family Medicine

## 2023-07-23 MED ORDER — PREGABALIN 50 MG PO CAPS: 50.0000 mg | ORAL_CAPSULE | Freq: Three times a day (TID) | ORAL | 1 refills | Status: DC

## 2023-07-23 NOTE — Telephone Encounter (Signed)
Agree with continued lyrica taper - increase to 50mg  TID and if after 2 weeks needs to further increase, would increase to 100mg  BID.  New 50mg  TID dose sent to pharmacy.

## 2023-07-23 NOTE — Telephone Encounter (Signed)
Called patient states that she started having breakthrough pain right after starting Lyrica. She does not feel like it was ever treating as well. She states that they have been increasing in the last few months. Patient takes the robaxin only when she has been doing strenuous activities. She states she takes less than 2 times a month. She has been taking tylenol when it increases. She describes pain as numbness pain as numbness and tingling in b/l feet and at times hand. Increased at night. She states that there are no new symptoms it is the same she has had in the past just does not feel like medications are at the right strength.

## 2023-07-23 NOTE — Telephone Encounter (Signed)
Left message on vm per dpr relaying Dr. Timoteo Expose message.

## 2023-07-23 NOTE — Telephone Encounter (Signed)
  pregabalin (LYRICA) 50 MG capsule  Patient is requesting an increase in lyrica, or something else, states she is having breakthrough pain and tingling  [facility] - [place], [place] - [address] (Ph: [phone])

## 2023-07-26 ENCOUNTER — Other Ambulatory Visit: Payer: Self-pay | Admitting: Family Medicine

## 2023-07-28 DIAGNOSIS — F329 Major depressive disorder, single episode, unspecified: Secondary | ICD-10-CM | POA: Diagnosis not present

## 2023-07-30 ENCOUNTER — Ambulatory Visit (INDEPENDENT_AMBULATORY_CARE_PROVIDER_SITE_OTHER): Payer: 59 | Admitting: Family Medicine

## 2023-07-30 ENCOUNTER — Encounter: Payer: Self-pay | Admitting: Family Medicine

## 2023-07-30 VITALS — BP 118/74 | HR 69 | Temp 98.8°F | Ht 70.0 in | Wt 184.5 lb

## 2023-07-30 DIAGNOSIS — J0100 Acute maxillary sinusitis, unspecified: Secondary | ICD-10-CM

## 2023-07-30 DIAGNOSIS — M3509 Sicca syndrome with other organ involvement: Secondary | ICD-10-CM | POA: Diagnosis not present

## 2023-07-30 DIAGNOSIS — K111 Hypertrophy of salivary gland: Secondary | ICD-10-CM | POA: Diagnosis not present

## 2023-07-30 DIAGNOSIS — E1169 Type 2 diabetes mellitus with other specified complication: Secondary | ICD-10-CM

## 2023-07-30 MED ORDER — AMOXICILLIN 500 MG PO CAPS: 1000.0000 mg | ORAL_CAPSULE | Freq: Two times a day (BID) | ORAL | 0 refills | Status: AC

## 2023-07-30 NOTE — Assessment & Plan Note (Signed)
Anticipate salivary gland enlargement in setting of current sinusitis issue and h/o Sjogren's.  Reassurance provided. To let us know if ongoing swelling/discomfort after abx treatment.

## 2023-07-30 NOTE — Progress Notes (Signed)
Ph: [phone] Fax: [phone]   Patient ID: Connar Rothenberger, female    DOB: 1960-10-13, 62 y.o.   MRN: [id number]  This visit was conducted in person.  BP 118/74   Pulse 69   Temp 98.8 F (37.1 C) (Oral)   Ht 5\' 10"  (1.778 m)   Wt 184 lb 8 oz (83.7 kg)   LMP 06/27/2019   SpO2 98%   BMI 26.47 kg/m    CC: nasal congestion and drainage  Subjective:   HPI: Francile Pekala is a 62 y.o. female presenting on 07/30/2023 for Sinus Problem (C/o nasal congestion/drainage, hoarseness, HA, chills and swollen lymph node. Sxs started about 3 wks ago. Concerned about risk of lymphoma. )   3 wk h/o bilateral maxillary sinus congestion, facial pain/pressure, trouble breathing due to congestion, trouble sleeping at night, productive of thick dark mucous, L jaw swollen lymph node. Has felt feverish in evenings. PNDrainage with ST.   No ear or tooth pain, cough or dyspnea or wheezing.   So far tried mucinex, Equate sinus pain/pressure relief with decongestant, tylenol.   No h/o asthma.  Non smoker.  Son has started feeling ill as well.   3 months ago she also had swollen lymph node after dental appointment. She returned to dentist - xray done as well. Over time it did improve.   H/o Sjogren's.  Last saw rheumatology several years ago.      Relevant past medical, surgical, family and social history reviewed and updated as indicated. Interim medical history since our last visit reviewed. Allergies and medications reviewed and updated. Outpatient Medications Prior to Visit  Medication Sig Dispense Refill   ALPRAZolam (XANAX) 0.25 MG tablet TAKE 1 TABLET BY MOUTH TWICE DAILY AS NEEDED FOR ANXIETY 30 tablet 0   ARMOUR THYROID 60 MG tablet TAKE 1 TABLET BY MOUTH DAILY BEFORE BREAKFAST 90 tablet 3   Blood Glucose Monitoring Suppl DEVI 1 each by Does not apply route in the morning, at noon, and at bedtime. May substitute to any manufacturer covered by patient's insurance. 1 each 0    CALCIUM-MAGNESIUM-ZINC PO Take by mouth daily.     cetirizine (ZYRTEC) 10 MG tablet Take 10 mg by mouth at bedtime.     Dulaglutide (TRULICITY) 0.75 MG/0.5ML SOPN Inject 0.75 mg into the skin once a week. 2 mL 5   Loteprednol Etabonate (EYSUVIS OP) Apply to eye as needed.     methocarbamol (ROBAXIN) 500 MG tablet Take 1 tablet (500 mg total) by mouth 2 (two) times daily as needed for muscle spasms. 60 tablet 1   metroNIDAZOLE (METROCREAM) 0.75 % cream Apply topically 2 (two) times daily.     Multiple Vitamins-Minerals (CENTRUM SILVER 50+WOMEN PO) Take 1 tablet by mouth daily.     nystatin ointment (MYCOSTATIN) Apply 1 application topically 2 (two) times daily. 30 g 0   pantoprazole (PROTONIX) 40 MG tablet TAKE 1 TABLET TWICE DAILY  BEFORE MEALS. 180 tablet 3   Perfluorohexyloctane (MIEBO) 1.338 GM/ML SOLN Apply 1 drop to eye 4 (four) times daily as needed (dry eyes).     pregabalin (LYRICA) 50 MG capsule Take 1 capsule (50 mg total) by mouth 3 (three) times daily. 90 capsule 1   doxycycline (VIBRA-TABS) 100 MG tablet Take 1 tablet (100 mg total) by mouth 2 (two) times daily. 20 tablet 0   triamcinolone cream (KENALOG) 0.1 % Apply 1 Application topically as needed. 15 g 0   No facility-administered medications prior to visit.  Per HPI unless specifically indicated in ROS section below Review of Systems  Objective:  BP 118/74   Pulse 69   Temp 98.8 F (37.1 C) (Oral)   Ht 5\' 10"  (1.778 m)   Wt 184 lb 8 oz (83.7 kg)   LMP 06/27/2019   SpO2 98%   BMI 26.47 kg/m   Wt Readings from Last 3 Encounters:  07/30/23 184 lb 8 oz (83.7 kg)  04/15/23 176 lb 12.8 oz (80.2 kg)  03/05/23 180 lb 2 oz (81.7 kg)      Physical Exam Vitals and nursing note reviewed.  Constitutional:      Appearance: Normal appearance. She is not ill-appearing.  HENT:     Head: Normocephalic and atraumatic.     Right Ear: Tympanic membrane, ear canal and external ear normal. There is no impacted cerumen.      Left Ear: Tympanic membrane, ear canal and external ear normal. There is no impacted cerumen.     Nose: Mucosal edema and congestion present. No rhinorrhea.     Right Turbinates: Not enlarged or swollen.     Left Turbinates: Not enlarged or swollen.     Right Sinus: Maxillary sinus tenderness present. No frontal sinus tenderness.     Left Sinus: Maxillary sinus tenderness present. No frontal sinus tenderness.     Mouth/Throat:     Mouth: Mucous membranes are moist.     Pharynx: Oropharynx is clear. No oropharyngeal exudate or posterior oropharyngeal erythema.  Eyes:     Extraocular Movements: Extraocular movements intact.     Conjunctiva/sclera: Conjunctivae normal.     Pupils: Pupils are equal, round, and reactive to light.  Neck:      Comments: Tender swelling inferior to left mandible suspect enlarged salivary gland Cardiovascular:     Rate and Rhythm: Normal rate and regular rhythm.     Pulses: Normal pulses.     Heart sounds: Normal heart sounds. No murmur heard. Pulmonary:     Effort: Pulmonary effort is normal. No respiratory distress.     Breath sounds: Normal breath sounds. No wheezing, rhonchi or rales.  Musculoskeletal:     Cervical back: Normal range of motion and neck supple. Tenderness present.  Lymphadenopathy:     Head:     Right side of head: No submental, submandibular, tonsillar, preauricular or posterior auricular adenopathy.     Left side of head: No submental, submandibular, tonsillar, preauricular or posterior auricular adenopathy.     Cervical: No cervical adenopathy.     Right cervical: No superficial, deep or posterior cervical adenopathy.    Left cervical: No superficial, deep or posterior cervical adenopathy.     Upper Body:     Right upper body: No supraclavicular adenopathy.     Left upper body: No supraclavicular adenopathy.  Skin:    Findings: No rash.  Neurological:     Mental Status: She is alert.  Psychiatric:        Mood and Affect: Mood  normal.        Behavior: Behavior normal.        Assessment & Plan:   Problem List Items Addressed This Visit     Acute sinusitis - Primary    Treat as bacterial sinus infection given duration and progression of symptoms. Rx amoxicillin 1000mg  BID x 10 days May continue further supportive measures.  Update if not improving with treatment.       Relevant Medications   amoxicillin (AMOXIL) 500 MG capsule   Sjogren's  disease (HCC)    Encouraged rheum f/u.       Salivary gland enlargement    Anticipate salivary gland enlargement in setting of current sinusitis issue and h/o Sjogren's.  Reassurance provided. To let us know if ongoing swelling/discomfort after abx treatment.         Meds ordered this encounter  Medications   amoxicillin (AMOXIL) 500 MG capsule    Sig: Take 2 capsules (1,000 mg total) by mouth in the morning and at bedtime for 10 days.    Dispense:  40 capsule    Refill:  0    No orders of the defined types were placed in this encounter.   Patient Instructions  You have a sinus infection. Take medicine as prescribed: antibiotic sent to pharmacy  Push fluids and plenty of rest. Nasal saline irrigation or neti pot to help drain sinuses. May use plain mucinex with plenty of fluid to help mobilize mucous. Please let us know if fever >101.5, trouble opening/closing mouth, difficulty swallowing, or worsening instead of improving as expected.   Follow up plan: No follow-ups on file.  Eustaquio Boyden, MD

## 2023-07-30 NOTE — Assessment & Plan Note (Signed)
Encouraged rheum f/u.

## 2023-07-30 NOTE — Assessment & Plan Note (Signed)
Treat as bacterial sinus infection given duration and progression of symptoms. Rx amoxicillin 1000mg  BID x 10 days May continue further supportive measures.  Update if not improving with treatment.

## 2023-07-30 NOTE — Patient Instructions (Signed)
You have a sinus infection. Take medicine as prescribed: antibiotic sent to pharmacy  Push fluids and plenty of rest. Nasal saline irrigation or neti pot to help drain sinuses. May use plain mucinex with plenty of fluid to help mobilize mucous. Please let us know if fever >101.5, trouble opening/closing mouth, difficulty swallowing, or worsening instead of improving as expected.

## 2023-07-31 MED ORDER — TRULICITY 3 MG/0.5ML ~~LOC~~ SOAJ: 3.0000 mg | SUBCUTANEOUS | 3 refills | Status: DC

## 2023-08-04 ENCOUNTER — Telehealth: Payer: Self-pay | Admitting: Family Medicine

## 2023-08-04 DIAGNOSIS — R221 Localized swelling, mass and lump, neck: Secondary | ICD-10-CM

## 2023-08-04 DIAGNOSIS — M3509 Sicca syndrome with other organ involvement: Secondary | ICD-10-CM

## 2023-08-04 DIAGNOSIS — K111 Hypertrophy of salivary gland: Secondary | ICD-10-CM

## 2023-08-04 NOTE — Telephone Encounter (Signed)
Patient would like to know if she could have a referral sent out to [facility] in [facility] [address].She said that after seeing Dr Reece Agar last week,he advised her to see ENT,she called the one that she seen previously has ,and they are needing a referral.

## 2023-08-05 NOTE — Telephone Encounter (Addendum)
Please call for update on symptoms - I saw her last week for acute sinusitis, treated with amoxicillin course.  Does she went to see ENT for ongoing symptoms, or has swelling in neck not gone down?  If abx course didn't resolve sinusitis symptoms, would probably recommend different antibiotic course.

## 2023-08-05 NOTE — Telephone Encounter (Signed)
Spoke with pt getting update on sxs. States sinus sxs seem to be improving. Still has swelling in neck and does want to proceed with ENT referral.

## 2023-08-05 NOTE — Progress Notes (Unsigned)
Office Visit Note  Patient: [name]             Date of Birth: 1961/08/05           MRN: [id number]             PCP: Eustaquio Boyden, MD Referring: Gweneth Dimitri, MD Visit Date: 08/07/2023 Occupation: @GUAROCC @  Subjective:  Discuss starting plaquenil    History of Present Illness: [name] is a 62 y.o. female with history of sjogren's syndrome.  Patient was last seen in the office on 01/01/22.  Patient states that he has been experiencing several new and worsening symptoms since her last office visit.  She is been experiencing increased arthralgias, joint stiffness, muscle fatigue, and myofascial pain.  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  She has also been experiencing ongoing sicca symptoms.  She is followed closely by ophthalmology but has been seeing dentist on a yearly basis.  She has been using XyliMelts but states that she has had inflammation in the left submandibular salivary gland off-and-on since September 2024.  Patient states that she has joined a Sjogren's group and has been doing some research.  She would like to initiate Plaquenil to see if it will help to manage her symptoms.  She has also requested a prednisone taper today.   Activities of Daily Living:  Patient reports morning stiffness for 2-3 hours.   Patient Reports nocturnal pain.  Difficulty dressing/grooming: Reports Difficulty climbing stairs: Reports Difficulty getting out of chair: Reports Difficulty using hands for taps, buttons, cutlery, and/or writing: Reports  Review of Systems  Constitutional:  Positive for fatigue.  HENT:  Positive for mouth dryness. Negative for mouth sores.   Eyes:  Positive for dryness.  Respiratory:  Positive for shortness of breath.   Cardiovascular:  Negative for chest pain and palpitations.  Gastrointestinal:  Positive for constipation. Negative for blood in stool and diarrhea.  Endocrine: Negative for increased  urination.  Genitourinary:  Negative for involuntary urination.  Musculoskeletal:  Positive for joint pain, joint pain, myalgias, muscle weakness, morning stiffness, muscle tenderness and myalgias. Negative for joint swelling.  Skin:  Positive for color change, rash and sensitivity to sunlight. Negative for hair loss.  Allergic/Immunologic: Positive for susceptible to infections.  Neurological:  Positive for dizziness. Negative for headaches.  Hematological:  Positive for swollen glands.  Psychiatric/Behavioral:  Positive for depressed mood. Negative for sleep disturbance. The patient is nervous/anxious.     PMFS History:  Patient Active Problem List   Diagnosis Date Noted   Salivary gland enlargement 07/30/2023   Sinus headache 04/15/2023   Dysuria 01/08/2023   Microhematuria 01/08/2023   Skin rash 01/08/2023   Malaise and fatigue 11/07/2022   Constipation 11/07/2022   Lateral pain of right hip 11/07/2022   Gastroesophageal reflux disease 10/09/2022   Low iron stores 10/09/2022   Pre-op evaluation 10/08/2022   Bruxism 07/11/2022   Urticaria 06/20/2022   Chest pain 12/24/2021   Vaginal yeast infection 06/04/2021   Overweight (BMI 25.0-29.9) 06/04/2021   Sjogren's disease (HCC) 01/11/2021   Dyspnea on exertion 01/11/2021   Wheezing 09/27/2020   Malar rash 09/27/2020   Dry mouth 09/27/2020   Pernio, initial encounter 09/27/2020   Eosinophilic esophagitis 09/27/2020   Menopause 11/29/2019   Type 2 diabetes mellitus with other specified complication (HCC) 10/25/2019   Chronic cough 10/18/2019   Health maintenance examination 09/19/2019   Fibromyalgia 09/08/2019   Peripheral neuropathy  06/14/2019   BPV (benign positional vertigo), right 06/14/2019   Vitamin D deficiency 09/14/2018   Fatigue 10/01/2017   Generalized anxiety disorder 11/21/2015   Polyarthralgia 08/14/2015   Vitamin B12 deficiency 08/10/2014   Acute sinusitis 11/16/2012   Insomnia 06/30/2012   Migraine with  status migrainosus 05/23/2011   MDD (major depressive disorder), single episode, in full remission (HCC)    Hypothyroidism due to Hashimoto's thyroiditis     Past Medical History:  Diagnosis Date   Allergic rhinitis    Anxiety    Cervical dysplasia 1990   s/p conization   Chronic sinusitis    Depression    Fibromyalgia    Hashimoto's disease    Hiatal hernia    Hip fracture, left (HCC) 2005   s/p ORIF   Hypothyroidism    Migraine    Neuropathy    Rosacea    dx by derm, per patient   Schatzki's ring    Sjogren syndrome, unspecified (HCC)     Family History  Problem Relation Age of Onset   Arthritis/Rheumatoid Mother        RA and OA   Asthma Mother    Breast cancer Maternal Aunt 48   Irregular heart beat Father        need ablation   Basal cell carcinoma Father    Asthma Father        exercise induced asthma    Arthritis Maternal Grandmother    Heart disease Maternal Grandmother    Lung cancer Paternal Grandfather    Drug abuse Brother    Alcohol abuse Brother    Appendicitis Son    Allergies Son    Migraines Son    Eczema Son    Healthy Son    Allergies Daughter    Healthy Daughter    Colon cancer Neg Hx    Pancreatic cancer Neg Hx    Stomach cancer Neg Hx    Esophageal cancer Neg Hx    Rectal cancer Neg Hx    Past Surgical History:  Procedure Laterality Date   BREAST ENHANCEMENT SURGERY  2007   BREAST IMPLANT REMOVAL Bilateral 05/21/2018   DILATION AND CURETTAGE OF UTERUS  1989   ORIF HIP FRACTURE  2005   [facility]   TONSILLECTOMY AND ADENOIDECTOMY     TOTAL HIP ARTHROPLASTY Left 2005   second surgery    Social History   Social History Narrative   11/29/19   From: raised in [place], but in the area for 25 years   Living: with husband, Roe Coombs (1988)   Work: retired, 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)      Family: Dahlia Client and [name] - grown adults - daughter is psych PA in [place]      Enjoys: pets, photography, gardening, exercise, cooking, crafts,  church activities      Exercise: cardio 3 days a week, weight lifting 2 times a week   Diet: not great, has reduced sugar      Safety   Seat belts: Yes    Guns: Yes  and secure   Safe in relationships: Yes    Immunization History  Administered Date(s) Administered   Influenza,inj,Quad PF,6+ Mos 08/10/2014, 08/14/2015, 05/08/2016, 06/23/2017, 09/14/2018, 06/01/2019, 06/04/2021, 06/19/2022   Tdap 05/01/2016   Zoster Recombinant(Shingrix) 07/23/2022, 10/08/2022     Objective: Vital Signs: BP 105/72 (BP Location: Left Arm, Patient Position: Sitting, Cuff Size: Normal)   Pulse 69   Resp 14   Ht 5\' 10"  (1.778 m)   Wt  183 lb 9.6 oz (83.3 kg)   LMP 06/27/2019   BMI 26.34 kg/m    Physical Exam Vitals and nursing note reviewed.  Constitutional:      Appearance: She is well-developed.  HENT:     Head: Normocephalic and atraumatic.     Mouth/Throat:     Comments: Tenderness and mild inflammation noted in the left submandibular salivary gland.   Eyes:     Conjunctiva/sclera: Conjunctivae normal.  Cardiovascular:     Rate and Rhythm: Normal rate and regular rhythm.     Heart sounds: Normal heart sounds.  Pulmonary:     Effort: Pulmonary effort is normal.     Breath sounds: Normal breath sounds.  Abdominal:     General: Bowel sounds are normal.     Palpations: Abdomen is soft.  Musculoskeletal:     Cervical back: Normal range of motion.  Skin:    General: Skin is warm and dry.     Capillary Refill: Capillary refill takes less than 2 seconds.  Neurological:     Mental Status: She is alert and oriented to person, place, and time.  Psychiatric:        Behavior: Behavior normal.      Musculoskeletal Exam: C-spine, thoracic spine, and lumbar spine good ROM.  Shoulder joints, elbow joints, elbow joints, wrist joints, MCPs, PIPs, and DIPs good ROM with no synovitis.  PIP and DIP thickening. Right hip has slightly limited ROM.  Left hip replacement has good ROM with some  discomfort.  Knee joints have good ROM with no warmth or effusion.  Ankle joints have good ROM with no tenderness or joint swelling.   CDAI Exam: CDAI Score: -- Patient Global: --; Provider Global: -- Swollen: --; Tender: -- Joint Exam 08/07/2023   No joint exam has been documented for this visit   There is currently no information documented on the homunculus. Go to the Rheumatology activity and complete the homunculus joint exam.  Investigation: No additional findings.  Imaging: No results found.  Recent Labs: Lab Results  Component Value Date   WBC 5.4 10/08/2022   HGB 13.3 10/08/2022   PLT 346.0 10/08/2022   NA 137 10/08/2022   K 4.7 10/08/2022   CL 102 10/08/2022   CO2 27 10/08/2022   GLUCOSE 82 10/08/2022   BUN 24 (H) 10/08/2022   CREATININE 0.99 10/08/2022   BILITOT 0.4 10/08/2022   ALKPHOS 68 10/08/2022   AST 18 10/08/2022   ALT 13 10/08/2022   PROT 7.2 10/08/2022   PROT 7.2 10/08/2022   ALBUMIN 4.4 10/08/2022   CALCIUM 10.4 10/08/2022   GFRAA 74 11/08/2020    Speciality Comments: No specialty comments available.  Procedures:  No procedures performed Allergies: Patient has no known allergies.   Assessment / Plan:     Visit Diagnoses: Sjogren's syndrome with other organ involvement (HCC) - +ANA 1: 80 nuclear dots, +Ro, RF-,  History of sicca symptoms, Raynaud's: Patient was last seen in the office on 01/01/2022.  She presents today with several new and worsening concerns since her last office visit.  She has continued to have chronic sicca symptoms and has been using over-the-counter products for symptomatic relief.  Since September she has had intermittent swelling and tenderness of the left submandibular salivary gland.  She has been seeing her dentist on a yearly basis and ophthalmology on a regular basis.  She has been experiencing increased arthralgias, joint stiffness, and myofascial pain.  She has also had chronic right epicondylitis of  the left elbow.   She experiences symptoms of neuropathy as well as intermittent symptoms of Raynaud's phenomenon as well.  Patient has joined a support group for Sjogren's and has found that Plaquenil has been effective for other Sjogren's patients.  She is interested in initiating Plaquenil.  Indications, contraindications, potential side effects of Plaquenil were reviewed today in detail.  All questions were addressed and consent was obtained.  Patient was advised to schedule a baseline Plaquenil eye examination.  CBC and CMP were updated today and a prescription for Plaquenil will be sent to the pharmacy pending lab results. Also reviewed lab work from 03/05/2023: ANA 1: 80 nuclear, multiple nuclear dots, CK within normal limits, complements within normal limits, double-stranded and negative, CRP within normal limits, anti-CCP negative, RF negative, ESR within normal limits. The following lab work will be updated today.  She will follow up in 6-8 weeks to assess her response.    - Plan: CBC with Differential/Platelet, COMPLETE METABOLIC PANEL WITH GFR, Urinalysis, Routine w reflex microscopic, Sedimentation rate, C3 and C4, Sjogrens syndrome-A extractable nuclear antibody, Sjogrens syndrome-B extractable nuclear antibody, Serum protein electrophoresis with reflex  Patient was counseled on the purpose, proper use, and adverse effects of hydroxychloroquine including nausea/diarrhea, skin rash, headaches, and sun sensitivity.  Advised patient to wear sunscreen once starting hydroxychloroquine to reduce risk of rash associated with sun sensitivity.  Discussed importance of annual eye exams while on hydroxychloroquine to monitor to ocular toxicity and discussed importance of frequent laboratory monitoring.  Provided patient with eye exam form for baseline ophthalmologic exam.  Provided patient with educational materials on hydroxychloroquine and answered all questions.  Patient consented to hydroxychloroquine. Will upload consent  in the media tab.    Reviewed risk for QTC prolongation when used in combination with other QTc prolonging agents (including but not limited to antiarrhythmics, macrolide antibiotics, flouroquinolone antibiotics, haloperidol, quetiapine, olanzapine, risperidone, droperidol, ziprasidone, amitriptyline, citalopram, ondansetron, migraine triptans, and methadone).  Dose will be Plaquenil 200 mg twice daily.  Prescription pending lab results.  High risk medication use - Plaquenil 200 mg 1 tablet by mouth twice daily.  CBC and CMP orders released today.  Her next lab work will be due in 1 month, 3 months, then every 5 months.  Standing orders for CBC and CMP placed today.  Advised the patient to call her ophthalmologist to schedule a baseline Plaquenil eye examination.  She was given eye examination form to take with her to her appointment.  Plan: CBC with Differential/Platelet, COMPLETE METABOLIC PANEL WITH GFR  Sternal pain - Evaluated by PCP-Dr. Milinda Antis on 12/24/21.  CXR did not reveal any active cardiopulmonary disease on 12/24/21.  EKG update on 12/24/2021 revealed normal sinus rhythm.  Not currently symptomatic.  Primary osteoarthritis of both hands: She has PIP and DIP thickening noted.  She has noticed increased pain and stiffness involving both hands.  No active synovitis was noted today.  Plan to check ESR today.   History of hip replacement, total, left - 2005-Doing well.  Good ROM with no groin pain.   Chondromalacia of patella, unspecified laterality - Followed by Dr. Jerl Santos.  Intermittent discomfort and stiffness involving both knees.   Chronic midline low back pain without sciatica: She has intermittent discomfort and stiffness in her lower back.  Other fatigue: Chronic   Fibromyalgia: She continues to experience intermittent rashes and muscle tenderness due to fibromyalgia.  She experiences muscle weakness and muscle fatigue at times.  She continues to have chronic fatigue.  She remains on  Lyrica as prescribed.  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  She has noticed increased muscle fatigue and muscle weakness especially after standing for prolonged peers of time.  Plan to check CK today.  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  After standing for prolonged periods of time she notices increased muscular weakness.  Plan to check CK today.  Plan: CK  Other medical conditions are listed as follows:  Eosinophilic esophagitis  Other polyneuropathy  Excessive sweating  Vitamin B12 deficiency  Vitamin D deficiency  Hypothyroidism due to Hashimoto's thyroiditis  BPV (benign positional vertigo), right   Orders: Orders Placed This Encounter  Procedures   CBC with Differential/Platelet   COMPLETE METABOLIC PANEL WITH GFR   Urinalysis, Routine w reflex microscopic   Sedimentation rate   C3 and C4   Sjogrens syndrome-A extractable nuclear antibody   Sjogrens syndrome-B extractable nuclear antibody   Serum protein electrophoresis with reflex   CK   No orders of the defined types were placed in this encounter.    Follow-Up Instructions: Return in about 2 months (around 10/08/2023) for Sjogren's syndrome.   Gearldine Bienenstock, PA-C  Note - This record has been created using Dragon software.  Chart creation errors have been sought, but may not always  have been located. Such creation errors do not reflect on  the standard of medical care.

## 2023-08-06 NOTE — Telephone Encounter (Signed)
ENT referral placed.

## 2023-08-06 NOTE — Addendum Note (Signed)
Addended by: Eustaquio Boyden on: 08/06/2023 06:56 AM   Modules accepted: Orders

## 2023-08-07 ENCOUNTER — Other Ambulatory Visit: Payer: Self-pay

## 2023-08-07 ENCOUNTER — Ambulatory Visit: Payer: 59 | Attending: Physician Assistant | Admitting: Physician Assistant

## 2023-08-07 ENCOUNTER — Telehealth: Payer: Self-pay

## 2023-08-07 ENCOUNTER — Encounter: Payer: Self-pay | Admitting: Physician Assistant

## 2023-08-07 VITALS — BP 105/72 | HR 69 | Resp 14 | Ht 70.0 in | Wt 183.6 lb

## 2023-08-07 DIAGNOSIS — R0789 Other chest pain: Secondary | ICD-10-CM | POA: Diagnosis not present

## 2023-08-07 DIAGNOSIS — R29898 Other symptoms and signs involving the musculoskeletal system: Secondary | ICD-10-CM | POA: Diagnosis not present

## 2023-08-07 DIAGNOSIS — G8929 Other chronic pain: Secondary | ICD-10-CM

## 2023-08-07 DIAGNOSIS — E538 Deficiency of other specified B group vitamins: Secondary | ICD-10-CM

## 2023-08-07 DIAGNOSIS — M545 Low back pain, unspecified: Secondary | ICD-10-CM

## 2023-08-07 DIAGNOSIS — M2240 Chondromalacia patellae, unspecified knee: Secondary | ICD-10-CM | POA: Diagnosis not present

## 2023-08-07 DIAGNOSIS — Z96642 Presence of left artificial hip joint: Secondary | ICD-10-CM | POA: Diagnosis not present

## 2023-08-07 DIAGNOSIS — R5383 Other fatigue: Secondary | ICD-10-CM | POA: Diagnosis not present

## 2023-08-07 DIAGNOSIS — H8111 Benign paroxysmal vertigo, right ear: Secondary | ICD-10-CM

## 2023-08-07 DIAGNOSIS — M797 Fibromyalgia: Secondary | ICD-10-CM | POA: Diagnosis not present

## 2023-08-07 DIAGNOSIS — R61 Generalized hyperhidrosis: Secondary | ICD-10-CM

## 2023-08-07 DIAGNOSIS — M19041 Primary osteoarthritis, right hand: Secondary | ICD-10-CM

## 2023-08-07 DIAGNOSIS — Z79899 Other long term (current) drug therapy: Secondary | ICD-10-CM | POA: Diagnosis not present

## 2023-08-07 DIAGNOSIS — E063 Autoimmune thyroiditis: Secondary | ICD-10-CM

## 2023-08-07 DIAGNOSIS — K200 Eosinophilic esophagitis: Secondary | ICD-10-CM | POA: Diagnosis not present

## 2023-08-07 DIAGNOSIS — E559 Vitamin D deficiency, unspecified: Secondary | ICD-10-CM

## 2023-08-07 DIAGNOSIS — M19042 Primary osteoarthritis, left hand: Secondary | ICD-10-CM

## 2023-08-07 DIAGNOSIS — M3509 Sicca syndrome with other organ involvement: Secondary | ICD-10-CM

## 2023-08-07 DIAGNOSIS — G6289 Other specified polyneuropathies: Secondary | ICD-10-CM | POA: Diagnosis not present

## 2023-08-07 DIAGNOSIS — M6281 Muscle weakness (generalized): Secondary | ICD-10-CM

## 2023-08-07 MED ORDER — PREDNISONE 5 MG PO TABS: ORAL_TABLET | ORAL | 0 refills | Status: DC

## 2023-08-07 NOTE — Progress Notes (Signed)
Pharmacy Note  Subjective: Patient presents today to [facility] for follow up office visit.   Patient seen by the pharmacist for counseling on hydroxychloroquine for Sjogren's syndrome.    Objective: CMP     Component Value Date/Time   NA 137 10/08/2022 1034   K 4.7 10/08/2022 1034   CL 102 10/08/2022 1034   CO2 27 10/08/2022 1034   GLUCOSE 82 10/08/2022 1034   BUN 24 (H) 10/08/2022 1034   CREATININE 0.99 10/08/2022 1034   CREATININE 0.82 01/01/2022 0955   CALCIUM 10.4 10/08/2022 1034   PROT 7.2 10/08/2022 1034   PROT 7.2 10/08/2022 1034   ALBUMIN 4.4 10/08/2022 1034   AST 18 10/08/2022 1034   ALT 13 10/08/2022 1034   ALKPHOS 68 10/08/2022 1034   BILITOT 0.4 10/08/2022 1034   GFRNONAA 64 11/08/2020 1215   GFRAA 74 11/08/2020 1215    CBC    Component Value Date/Time   WBC 5.4 10/08/2022 1034   RBC 4.32 10/08/2022 1034   HGB 13.3 10/08/2022 1034   HCT 39.7 10/08/2022 1034   PLT 346.0 10/08/2022 1034   MCV 91.8 10/08/2022 1034   MCH 30.2 01/01/2022 0955   MCHC 33.5 10/08/2022 1034   RDW 15.0 10/08/2022 1034   LYMPHSABS 2.0 10/08/2022 1034   MONOABS 0.4 10/08/2022 1034   EOSABS 0.3 10/08/2022 1034   BASOSABS 0.1 10/08/2022 1034    Assessment/Plan: Patient was counseled on the purpose, proper use, and adverse effects of hydroxychloroquine including nausea/diarrhea, skin rash, headaches, and sun sensitivity.  Advised patient to wear sunscreen once starting hydroxychloroquine to reduce risk of rash associated with sun sensitivity.  Discussed importance of annual eye exams while on hydroxychloroquine to monitor to ocular toxicity and discussed importance of frequent laboratory monitoring.  Provided patient with eye exam form for baseline ophthalmologic exam.  Provided patient with educational materials on hydroxychloroquine and answered all questions.  Patient consented to hydroxychloroquine. Will upload consent in the media tab.    Reviewed risk for QTC  prolongation when used in combination with other QTc prolonging agents (including but not limited to antiarrhythmics, macrolide antibiotics, flouroquinolone antibiotics, haloperidol, quetiapine, olanzapine, risperidone, droperidol, ziprasidone, amitriptyline, citalopram, ondansetron, migraine triptans, and methadone).  Dose will be Plaquenil 200 mg twice daily.  Prescription pending lab results.   Prednisone taper sent to pharmacy today  Chesley Mires, PharmD, MPH, BCPS, CPP Clinical Pharmacist (Rheumatology and Pulmonology)

## 2023-08-07 NOTE — Patient Instructions (Addendum)
Standing Labs We placed an order today for your standing lab work.   Please have your standing labs drawn in 1 month, 3 months, and then every 5 months   Please have your labs drawn 2 weeks prior to your appointment so that the provider can discuss your lab results at your appointment, if possible.  Please note that you may see your imaging and lab results in MyChart before we have reviewed them. We will contact you once all results are reviewed. Please allow our office up to 72 hours to thoroughly review all of the results before contacting the office for clarification of your results.  WALK-IN LAB HOURS  Monday through Thursday from 8:00 am -12:30 pm and 1:00 pm-5:00 pm and Friday from 8:00 am-12:00 pm.  Patients with office visits requiring labs will be seen before walk-in labs.  You may encounter longer than normal wait times. Please allow additional time. Wait times may be shorter on  Monday and Thursday afternoons.  We do not book appointments for walk-in labs. We appreciate your patience and understanding with our staff.   Labs are drawn by [facility]. Please bring your co-pay at the time of your lab draw.  You may receive a bill from [facility] for your lab work.  Please note if you are on Hydroxychloroquine and and an order has been placed for a Hydroxychloroquine level,  you will need to have it drawn 4 hours or more after your last dose.  If you wish to have your labs drawn at another location, please call the office 24 hours in advance so we can fax the orders.  The office is located at [address], [address], [place], [place] [address]   If you have any questions regarding directions or hours of operation,  please call [phone].   As a reminder, please drink plenty of water prior to coming for your lab work. Thanks!  Hydroxychloroquine Tablets What is this medication? HYDROXYCHLOROQUINE (hye drox ee KLOR oh kwin) treats autoimmune conditions, such as rheumatoid arthritis  and lupus. It works by slowing down an overactive immune system. It may also be used to prevent and treat malaria. It works by killing the parasite that causes malaria. It belongs to a group of medications called DMARDs. This medicine may be used for other purposes; ask your health care provider or pharmacist if you have questions. COMMON BRAND NAME(S): Plaquenil, Quineprox, SOVUNA What should I tell my care team before I take this medication? They need to know if you have any of these conditions: Diabetes Eye disease, vision problems Frequently drink alcohol G6PD deficiency Heart disease Irregular heartbeat or rhythm Kidney disease Liver disease Porphyria Psoriasis An unusual or allergic reaction to hydroxychloroquine, other medications, foods, dyes, or preservatives Pregnant or trying to get pregnant Breastfeeding How should I use this medication? Take this medication by mouth with water. Take it as directed on the prescription label. Do not cut, crush, or chew this medication. Swallow the tablets whole. Take it with food. Do not take it more than directed. Take all of this medication unless your care team tells you to stop it early. Keep taking it even if you think you are better. Take products with antacids in them at a different time of day than this medication. Take this medication 4 hours before or 4 hours after antacids. Talk to your care team if you have questions. Talk to your care team about the use of this medication in children. While this medication may be  prescribed for selected conditions, precautions do apply. Overdosage: If you think you have taken too much of this medicine contact a poison control center or emergency room at once. NOTE: This medicine is only for you. Do not share this medicine with others. What if I miss a dose? If you miss a dose, take it as soon as you can. If it is almost time for your next dose, take only that dose. Do not take double or extra  doses. What may interact with this medication? Do not take this medication with any of the following: Cisapride Dronedarone Pimozide Thioridazine This medication may also interact with the following: Ampicillin Antacids Cimetidine Cyclosporine Digoxin Kaolin Medications for diabetes, such as insulin, glipizide, glyburide Medications for seizures, such as carbamazepine, phenobarbital, phenytoin Mefloquine Methotrexate Other medications that cause heart rhythm changes Praziquantel This list may not describe all possible interactions. Give your health care provider a list of all the medicines, herbs, non-prescription drugs, or dietary supplements you use. Also tell them if you smoke, drink alcohol, or use illegal drugs. Some items may interact with your medicine. What should I watch for while using this medication? Visit your care team for regular checks on your progress. Tell your care team if your symptoms do not start to get better or if they get worse. You may need blood work done while you are taking this medication. If you take other medications that can affect heart rhythm, you may need more testing. Talk to your care team if you have questions. Your vision may be tested before and during use of this medication. Tell your care team right away if you have any change in your eyesight. This medication may cause serious skin reactions. They can happen weeks to months after starting the medication. Contact your care team right away if you notice fevers or flu-like symptoms with a rash. The rash may be red or purple and then turn into blisters or peeling of the skin. Or, you might notice a red rash with swelling of the face, lips or lymph nodes in your neck or under your arms. If you or your family notice any changes in your behavior, such as new or worsening depression, thoughts of harming yourself, anxiety, or other unusual or disturbing thoughts, or memory loss, call your care team right  away. What side effects may I notice from receiving this medication? Side effects that you should report to your care team as soon as possible: Allergic reactions--skin rash, itching, hives, swelling of the face, lips, tongue, or throat Aplastic anemia--unusual weakness or fatigue, dizziness, headache, trouble breathing, increased bleeding or bruising Change in vision Heart rhythm changes--fast or irregular heartbeat, dizziness, feeling faint or lightheaded, chest pain, trouble breathing Infection--fever, chills, cough, or sore throat Low blood sugar (hypoglycemia)--tremors or shaking, anxiety, sweating, cold or clammy skin, confusion, dizziness, rapid heartbeat Muscle injury--unusual weakness or fatigue, muscle pain, dark yellow or brown urine, decrease in amount of urine Pain, tingling, or numbness in the hands or feet Rash, fever, and swollen lymph nodes Redness, blistering, peeling, or loosening of the skin, including inside the mouth Thoughts of suicide or self-harm, worsening mood, or feelings of depression Unusual bruising or bleeding Side effects that usually do not require medical attention (report to your care team if they continue or are bothersome): Diarrhea Headache Nausea Stomach pain Vomiting This list may not describe all possible side effects. Call your doctor for medical advice about side effects. You may report side effects to FDA at 1-800-FDA-1088.  Where should I keep my medication? Keep out of the reach of children and pets. Store at room temperature up to 30 degrees C (86 degrees F). Protect from light. Get rid of any unused medication after the expiration date. To get rid of medications that are no longer needed or have expired: Take the medication to a medication take-back program. Check with your pharmacy or law enforcement to find a location. If you cannot return the medication, check the label or package insert to see if the medication should be thrown out in the  garbage or flushed down the toilet. If you are not sure, ask your care team. If it is safe to put it in the trash, empty the medication out of the container. Mix the medication with cat litter, dirt, coffee grounds, or other unwanted substance. Seal the mixture in a bag or container. Put it in the trash. NOTE: This sheet is a summary. It may not cover all possible information. If you have questions about this medicine, talk to your doctor, pharmacist, or health care provider.  2024 Elsevier/Gold Standard (2022-02-18 00:00:00)

## 2023-08-07 NOTE — Telephone Encounter (Signed)
Pending lab results, patient will be starting plaquenil per Sherron Ales, PA-C.   Devki obtained consent at patient's appointment. Thanks!

## 2023-08-07 NOTE — Telephone Encounter (Signed)
Please review and sign pended prednisone prescription. Thanks!

## 2023-08-08 ENCOUNTER — Other Ambulatory Visit: Payer: Self-pay | Admitting: *Deleted

## 2023-08-08 MED ORDER — HYDROXYCHLOROQUINE SULFATE 200 MG PO TABS: 200.0000 mg | ORAL_TABLET | Freq: Two times a day (BID) | ORAL | 0 refills | Status: DC

## 2023-08-08 NOTE — Telephone Encounter (Signed)
New RX for PLQ sent to Sherron Ales, PA for approval.

## 2023-08-08 NOTE — Progress Notes (Signed)
CBC WNL  ESR WNL  CK WNL  UA normal  Complements WNL  BUN remains slightly elevated. Rest of CMP WNL.

## 2023-08-11 DIAGNOSIS — F329 Major depressive disorder, single episode, unspecified: Secondary | ICD-10-CM | POA: Diagnosis not present

## 2023-08-11 NOTE — Progress Notes (Signed)
SPEP did not reveal any abnormal protein bands.

## 2023-08-13 LAB — COMPLETE METABOLIC PANEL WITH GFR
AG Ratio: 1.5 (calc) (ref 1.0–2.5)
ALT: 13 U/L (ref 6–29)
AST: 17 U/L (ref 10–35)
Albumin: 4.4 g/dL (ref 3.6–5.1)
Alkaline phosphatase (APISO): 88 U/L (ref 37–153)
BUN/Creatinine Ratio: 30 (calc) — ABNORMAL HIGH (ref 6–22)
BUN: 31 mg/dL — ABNORMAL HIGH (ref 7–25)
CO2: 26 mmol/L (ref 20–32)
Calcium: 10 mg/dL (ref 8.6–10.4)
Chloride: 105 mmol/L (ref 98–110)
Creat: 1.05 mg/dL (ref 0.50–1.05)
Globulin: 2.9 g/dL (ref 1.9–3.7)
Glucose, Bld: 83 mg/dL (ref 65–99)
Potassium: 4.3 mmol/L (ref 3.5–5.3)
Sodium: 138 mmol/L (ref 135–146)
Total Bilirubin: 0.5 mg/dL (ref 0.2–1.2)
Total Protein: 7.3 g/dL (ref 6.1–8.1)
eGFR: 61 mL/min/{1.73_m2} (ref >=60)

## 2023-08-13 LAB — C3 AND C4
C3 Complement: 150 mg/dL (ref 83–193)
C4 Complement: 26 mg/dL (ref 15–57)

## 2023-08-13 LAB — PROTEIN ELECTROPHORESIS, SERUM, WITH REFLEX
Albumin ELP: 4.3 g/dL (ref 3.8–4.8)
Alpha 1: 0.2 g/dL (ref 0.2–0.3)
Alpha 2: 0.8 g/dL (ref 0.5–0.9)
Beta 2: 0.4 g/dL (ref 0.2–0.5)
Beta Globulin: 0.4 g/dL (ref 0.4–0.6)
Gamma Globulin: 1.2 g/dL (ref 0.8–1.7)
Total Protein: 7.3 g/dL (ref 6.1–8.1)

## 2023-08-13 LAB — CBC WITH DIFFERENTIAL/PLATELET
Absolute Lymphocytes: 2192 {cells}/uL (ref 850–3900)
Absolute Monocytes: 589 {cells}/uL (ref 200–950)
Basophils Absolute: 81 {cells}/uL (ref 0–200)
Basophils Relative: 1.5 %
Eosinophils Absolute: 178 {cells}/uL (ref 15–500)
Eosinophils Relative: 3.3 %
HCT: 39.6 % (ref 35.0–45.0)
Hemoglobin: 13.4 g/dL (ref 11.7–15.5)
MCH: 30.5 pg (ref 27.0–33.0)
MCHC: 33.8 g/dL (ref 32.0–36.0)
MCV: 90.2 fL (ref 80.0–100.0)
MPV: 10.1 fL (ref 7.5–12.5)
Monocytes Relative: 10.9 %
Neutro Abs: 2360 {cells}/uL (ref 1500–7800)
Neutrophils Relative %: 43.7 %
Platelets: 294 10*3/uL (ref 140–400)
RBC: 4.39 10*6/uL (ref 3.80–5.10)
RDW: 13.2 % (ref 11.0–15.0)
Total Lymphocyte: 40.6 %
WBC: 5.4 10*3/uL (ref 3.8–10.8)

## 2023-08-13 LAB — URINALYSIS, ROUTINE W REFLEX MICROSCOPIC
Bilirubin Urine: NEGATIVE
Glucose, UA: NEGATIVE
Hgb urine dipstick: NEGATIVE
Ketones, ur: NEGATIVE
Leukocytes,Ua: NEGATIVE
Nitrite: NEGATIVE
Protein, ur: NEGATIVE
Specific Gravity, Urine: 1.022 (ref 1.001–1.035)
pH: 6.5 (ref 5.0–8.0)

## 2023-08-13 LAB — SJOGRENS SYNDROME-A EXTRACTABLE NUCLEAR ANTIBODY: SSA (Ro) (ENA) Antibody, IgG: <1 AI

## 2023-08-13 LAB — SJOGRENS SYNDROME-B EXTRACTABLE NUCLEAR ANTIBODY: SSB (La) (ENA) Antibody, IgG: <1 AI

## 2023-08-13 LAB — CK: Total CK: 83 U/L (ref 29–143)

## 2023-08-13 LAB — SEDIMENTATION RATE: Sed Rate: 6 mm/h (ref 0–30)

## 2023-08-14 NOTE — Progress Notes (Signed)
Ro and La antibodies negative

## 2023-08-15 ENCOUNTER — Encounter: Payer: Self-pay | Admitting: Family Medicine

## 2023-08-25 ENCOUNTER — Ambulatory Visit: Payer: Self-pay | Admitting: Family Medicine

## 2023-08-25 MED ORDER — PREGABALIN 100 MG PO CAPS: 100.0000 mg | ORAL_CAPSULE | Freq: Two times a day (BID) | ORAL | 3 refills | Status: DC

## 2023-08-25 NOTE — Telephone Encounter (Signed)
Chief Complaint: stomach pain Symptoms: pain, hurts to eat, lie flat Frequency: cones and goes  Disposition: [] ED /[] Urgent Care (no appt availability in office) / [x] Appointment(In office/virtual)/ []  [facility]/ [] Home Care/ [] Refused Recommended Disposition /[] [facility]/ []  Follow-up with PCP Additional Notes: Pt complaining of stomach pain on left side near belly button that started 12/27. Pt stated pain is worse when she eats or walks. Sitting in chair relieves pain. Pt denies pain radiating. Pt also concerned about foul smelling urine and urine frequency. Pt states she is drinking lots of water. Urine smell has been a concern for a week now. Pt afraid this is due to her Trulicity increase of 1.5 to 3mg . Pt is concerned she has pancreatitis. Pt stated, "Dr warned of this side effect" and wants to be checked. Pt also stated she added a new medication (Plaquenil) 7-10 days and wonders if this is the cause. Pt would like to be called back today if she should stop medication. Pt has appt with provider 12/31 @ 930am.                Copied from CRM [id number]. Topic: Clinical - Red Word Triage >> Aug 25, 2023  9:15 AM [name] wrote: Red Word that prompted transfer to Nurse Triage: Poss pancreatitis, stabbing stomach pains not radiating to the back much, strong smell to urine start this weekend. When not eating, stomach doesn't hurt but when eating stomach pains. Pt is unsure if she has a fever, but feels very sick. [phone] Reason for Disposition  [1] MODERATE pain (e.g., interferes with normal activities) AND [2] pain comes and goes (cramps) AND [3] present > 24 hours  (Exception: Pain with Vomiting or Diarrhea - see that Guideline.)  Answer Assessment - Initial Assessment Questions 1. LOCATION: "Where does it hurt?"      Left side near belly button 2. RADIATION: "Does the pain shoot anywhere else?" (e.g., chest, back)     no 3. ONSET: "When did the pain  begin?" (e.g., minutes, hours or days ago)      12/27 4. SUDDEN: "Gradual or sudden onset?"     gradual 5. PATTERN "Does the pain come and go, or is it constant?"    - If it comes and goes: "How long does it last?" "Do you have pain now?"     (Note: Comes and goes means the pain is intermittent. It goes away completely between bouts.)    - If constant: "Is it getting better, staying the same, or getting worse?"      (Note: Constant means the pain never goes away completely; most serious pain is constant and gets worse.)      Comes and go 6. SEVERITY: "How bad is the pain?"  (e.g., Scale 1-10; mild, moderate, or severe)    - MILD (1-3): Doesn't interfere with normal activities, abdomen soft and not tender to touch.     - MODERATE (4-7): Interferes with normal activities or awakens from sleep, abdomen tender to touch.     - SEVERE (8-10): Excruciating pain, doubled over, unable to do any normal activities.       7 7. RECURRENT SYMPTOM: "Have you ever had this type of stomach pain before?" If Yes, ask: "When was the last time?" and "What happened that time?"      no 8. CAUSE: "What do you think is causing the stomach pain?"     Pancreatitis because on Trulicity  9. RELIEVING/AGGRAVATING FACTORS: "What makes  it better or worse?" (e.g., antacids, bending or twisting motion, bowel movement)     Sit/not bent and not eating pain is low 10. OTHER SYMPTOMS: "Do you have any other symptoms?" (e.g., back pain, diarrhea, fever, urination pain, vomiting)       Frequent urination  Protocols used: Abdominal Pain - Female-A-AH

## 2023-08-26 ENCOUNTER — Telehealth: Payer: Self-pay

## 2023-08-26 ENCOUNTER — Encounter: Payer: Self-pay | Admitting: Family Medicine

## 2023-08-26 ENCOUNTER — Ambulatory Visit: Payer: 59 | Admitting: Family Medicine

## 2023-08-26 VITALS — BP 116/72 | HR 58 | Temp 99.2°F | Ht 70.0 in | Wt 180.0 lb

## 2023-08-26 DIAGNOSIS — R350 Frequency of micturition: Secondary | ICD-10-CM | POA: Diagnosis not present

## 2023-08-26 DIAGNOSIS — R109 Unspecified abdominal pain: Secondary | ICD-10-CM | POA: Diagnosis not present

## 2023-08-26 LAB — COMPREHENSIVE METABOLIC PANEL
ALT: 11 U/L (ref 0–35)
AST: 16 U/L (ref 0–37)
Albumin: 4.4 g/dL (ref 3.5–5.2)
Alkaline Phosphatase: 74 U/L (ref 39–117)
BUN: 22 mg/dL (ref 6–23)
CO2: 27 meq/L (ref 19–32)
Calcium: 10 mg/dL (ref 8.4–10.5)
Chloride: 101 meq/L (ref 96–112)
Creatinine, Ser: 1.12 mg/dL (ref 0.40–1.20)
GFR: 53.38 mL/min — ABNORMAL LOW (ref >60.00)
Glucose, Bld: 92 mg/dL (ref 70–99)
Potassium: 4.3 meq/L (ref 3.5–5.1)
Sodium: 138 meq/L (ref 135–145)
Total Bilirubin: 0.4 mg/dL (ref 0.2–1.2)
Total Protein: 7.4 g/dL (ref 6.0–8.3)

## 2023-08-26 LAB — CBC WITH DIFFERENTIAL/PLATELET
Basophils Absolute: 0.1 10*3/uL (ref 0.0–0.1)
Basophils Relative: 1 % (ref 0.0–3.0)
Eosinophils Absolute: 0.2 10*3/uL (ref 0.0–0.7)
Eosinophils Relative: 3.1 % (ref 0.0–5.0)
HCT: 41.7 % (ref 36.0–46.0)
Hemoglobin: 13.9 g/dL (ref 12.0–15.0)
Lymphocytes Relative: 21.4 % (ref 12.0–46.0)
Lymphs Abs: 1.5 10*3/uL (ref 0.7–4.0)
MCHC: 33.4 g/dL (ref 30.0–36.0)
MCV: 92.9 fL (ref 78.0–100.0)
Monocytes Absolute: 0.6 10*3/uL (ref 0.1–1.0)
Monocytes Relative: 8.9 % (ref 3.0–12.0)
Neutro Abs: 4.5 10*3/uL (ref 1.4–7.7)
Neutrophils Relative %: 65.6 % (ref 43.0–77.0)
Platelets: 307 10*3/uL (ref 150.0–400.0)
RBC: 4.49 Mil/uL (ref 3.87–5.11)
RDW: 14.4 % (ref 11.5–15.5)
WBC: 6.8 10*3/uL (ref 4.0–10.5)

## 2023-08-26 LAB — POC URINALSYSI DIPSTICK (AUTOMATED)
Glucose, UA: NEGATIVE
Ketones, UA: NEGATIVE
Nitrite, UA: NEGATIVE
Protein, UA: POSITIVE — AB
Spec Grav, UA: 1.02 (ref 1.010–1.025)
Urobilinogen, UA: 0.2 U/dL
pH, UA: 5.5 (ref 5.0–8.0)

## 2023-08-26 LAB — LIPASE: Lipase: 42 U/L (ref 11.0–59.0)

## 2023-08-26 MED ORDER — CEPHALEXIN 500 MG PO CAPS: 500.0000 mg | ORAL_CAPSULE | Freq: Two times a day (BID) | ORAL | 0 refills | Status: DC

## 2023-08-26 NOTE — Assessment & Plan Note (Addendum)
UA/micro suspicious for UTI infection - treat with keflex while we await urine culture results.  Discussed tylenol PRN discomfort, and avoiding bladder irritants.

## 2023-08-26 NOTE — Assessment & Plan Note (Signed)
Check labs today including CBC, lipase, CMP.  No known h/o diverticulosis on previous colonoscopy 2014.  Treat as UTI with keflex course as per below. Update if ongoing symptoms to consider abdominal imaging.

## 2023-08-26 NOTE — Telephone Encounter (Signed)
 Copied from CRM [id number]. Topic: Clinical - Prescription Issue >> Aug 26, 2023 12:57 PM Robinson H wrote: Reason for CRM: Patient states pharmacy is saying they still saying they don't have prescription for the Keflex  from provider given at today's appointment. Pharmacy wants to know if prescription can be resent or office can call it in verbally. Pharmacy information below [facility] - [place], [place] - [address]  Phone: [phone] Fax: [phone]   [place] [phone]

## 2023-08-26 NOTE — Telephone Encounter (Signed)
Called left detailed message that has been called in. Will send my chart as well.

## 2023-08-26 NOTE — Patient Instructions (Signed)
 Urine suspicious for infection Labs today  Start antibiotic keflex  twice daily for 1 week sent to pharmacy. May take tylenol for discomfort, push fluids, limit caffeine, spicy foods dark sodas and other bladder irritants for now.  Urine culture sent.  We will be in touch with results.

## 2023-08-26 NOTE — Progress Notes (Signed)
 Ph: [phone] Fax: [phone]   Patient ID: [name], female    DOB: 08-10-1961, 62 y.o.   MRN: [id number]  This visit was conducted in person.  BP 116/72   Pulse (!) 58   Temp 99.2 F (37.3 C) (Oral)   Ht 5' 10 (1.778 m)   Wt 180 lb (81.6 kg)   LMP 06/27/2019   SpO2 98%   BMI 25.83 kg/m    CC: LLQ abd pain  Subjective:   HPI: [name] is a 62 y.o. female presenting on 08/26/2023 for Abdominal Pain (C/o LLQ abd pain. Denies N/V/D. Started 08/22/23. Also, c/o urinary frequency and foul urine odor. )   Husband recently hospitalized with afib.   7-10d h/o urinary frequency and abnormal urine odor, then 2d ago developed LLQ abd discomfort near umbilicus described as ache. Notes worse pain after meals (fried chicken on Sunday). Some increased bowel frequency.   No fevers/chills, dysuria, urgency, hematuria, nausea/vomiting, flank pain, bowel changes.   She continues trulicity  3mg  weekly - recently increased dose, takes on Thursday. Would be interested in dropping dose again.  Seeing rheum for Sjogren's - she recently started Plaquenil .  She recently completed amoxicillin  course for acute sinusitis.  She recently completed prednisone  taper through rheum.   Colonoscopy 06/2013 - 1 benign polyp, rpt 10 yrs ([facility])   H/o small bowel dysmotility noted on CT scan 01/2010 presented with abd pain s/p ER eval.      Relevant past medical, surgical, family and social history reviewed and updated as indicated. Interim medical history since our last visit reviewed. Allergies and medications reviewed and updated. Outpatient Medications Prior to Visit  Medication Sig Dispense Refill   ALPRAZolam  (XANAX ) 0.25 MG tablet TAKE 1 TABLET BY MOUTH TWICE DAILY AS NEEDED FOR ANXIETY 30 tablet 0   ARMOUR THYROID  60 MG tablet TAKE 1 TABLET BY MOUTH DAILY BEFORE BREAKFAST 90 tablet 3   Blood Glucose Monitoring Suppl DEVI 1 each by Does not apply route in the morning, at  noon, and at bedtime. May substitute to any manufacturer covered by patient's insurance. 1 each 0   CALCIUM-MAGNESIUM-ZINC PO Take by mouth daily.     cetirizine (ZYRTEC) 10 MG tablet Take 10 mg by mouth at bedtime.     Dulaglutide  (TRULICITY ) 3 MG/0.5ML SOAJ Inject 3 mg as directed once a week. 2 mL 3   hydroxychloroquine  (PLAQUENIL ) 200 MG tablet Take 1 tablet (200 mg total) by mouth 2 (two) times daily. 180 tablet 0   Loteprednol Etabonate (EYSUVIS OP) Apply to eye as needed.     methocarbamol  (ROBAXIN ) 500 MG tablet Take 1 tablet (500 mg total) by mouth 2 (two) times daily as needed for muscle spasms. 60 tablet 1   metroNIDAZOLE  (METROCREAM ) 0.75 % cream Apply topically as needed.     Multiple Vitamins-Minerals (CENTRUM SILVER 50+WOMEN PO) Take 1 tablet by mouth daily.     nystatin  ointment (MYCOSTATIN ) Apply 1 application topically 2 (two) times daily. (Patient taking differently: Apply 1 application  topically as needed.) 30 g 0   OVER THE COUNTER MEDICATION LUNA and GABA supplements     pantoprazole  (PROTONIX ) 40 MG tablet TAKE 1 TABLET TWICE DAILY  BEFORE MEALS. 180 tablet 3   Perfluorohexyloctane (MIEBO) 1.338 GM/ML SOLN Apply 1 drop to eye 4 (four) times daily as needed (dry eyes).     pregabalin  (LYRICA ) 100 MG capsule Take 1 capsule (100 mg total) by mouth 2 (two) times daily. 60 capsule  3   predniSONE  (DELTASONE ) 5 MG tablet Take 2 tabs po qd x 4 days, 1.5  tabs po qd x 4 days, 1  tabs po qd x 4 days, 1/2  tab po qd x 4 days 20 tablet 0   No facility-administered medications prior to visit.     Per HPI unless specifically indicated in ROS section below Review of Systems  Objective:  BP 116/72   Pulse (!) 58   Temp 99.2 F (37.3 C) (Oral)   Ht 5' 10 (1.778 m)   Wt 180 lb (81.6 kg)   LMP 06/27/2019   SpO2 98%   BMI 25.83 kg/m   Wt Readings from Last 3 Encounters:  08/26/23 180 lb (81.6 kg)  08/07/23 183 lb 9.6 oz (83.3 kg)  07/30/23 184 lb 8 oz (83.7 kg)       Physical Exam Vitals and nursing note reviewed.  Constitutional:      Appearance: Normal appearance. She is not ill-appearing.  HENT:     Mouth/Throat:     Mouth: Mucous membranes are moist.     Pharynx: Oropharynx is clear. No oropharyngeal exudate or posterior oropharyngeal erythema.  Eyes:     Extraocular Movements: Extraocular movements intact.     Pupils: Pupils are equal, round, and reactive to light.  Neck:     Thyroid : No thyroid  mass or thyromegaly.  Cardiovascular:     Rate and Rhythm: Normal rate and regular rhythm.     Pulses: Normal pulses.     Heart sounds: Normal heart sounds. No murmur heard. Pulmonary:     Effort: Pulmonary effort is normal. No respiratory distress.     Breath sounds: Normal breath sounds. No wheezing, rhonchi or rales.  Abdominal:     General: Bowel sounds are normal. There is no distension.     Palpations: Abdomen is soft. There is no mass.     Tenderness: There is abdominal tenderness. There is no right CVA tenderness, left CVA tenderness, guarding or rebound.     Hernia: No hernia is present.  Musculoskeletal:     Right lower leg: No edema.     Left lower leg: No edema.  Lymphadenopathy:     Cervical: No cervical adenopathy.  Skin:    General: Skin is warm and dry.     Findings: No rash.  Neurological:     Mental Status: She is alert.  Psychiatric:        Mood and Affect: Mood normal.        Behavior: Behavior normal.       Results for orders placed or performed in visit on 08/26/23  POCT Urinalysis Dipstick (Automated)   Collection Time: 08/26/23  9:42 AM  Result Value Ref Range   Color, UA yellow    Clarity, UA cloudy    Glucose, UA Negative Negative   Bilirubin, UA 1+    Ketones, UA negative    Spec Grav, UA 1.020 1.010 - 1.025   Blood, UA 1+    pH, UA 5.5 5.0 - 8.0   Protein, UA Positive (A) Negative   Urobilinogen, UA 0.2 0.2 or 1.0 E.U./dL   Nitrite, UA negative    Leukocytes, UA Large (3+) (A) Negative     Assessment & Plan:   Problem List Items Addressed This Visit     Left sided abdominal pain - Primary   Check labs today including CBC, lipase, CMP.  No known h/o diverticulosis on previous colonoscopy 2014.  Treat as UTI  with keflex  course as per below. Update if ongoing symptoms to consider abdominal imaging.       Relevant Orders   POCT Urinalysis Dipstick (Automated) (Completed)   Comprehensive metabolic panel   CBC with Differential/Platelet   Lipase   Urine Culture   Urinary frequency   UA/micro suspicious for UTI infection - treat with keflex  while we await urine culture results.  Discussed tylenol PRN discomfort, and avoiding bladder irritants.       Relevant Orders   Urine Culture     No orders of the defined types were placed in this encounter.   Orders Placed This Encounter  Procedures   Urine Culture   Comprehensive metabolic panel   CBC with Differential/Platelet   Lipase   POCT Urinalysis Dipstick (Automated)    Patient Instructions  Urine suspicious for infection Labs today  Start antibiotic keflex  twice daily for 1 week sent to pharmacy. May take tylenol for discomfort, push fluids, limit caffeine, spicy foods dark sodas and other bladder irritants for now.  Urine culture sent.  We will be in touch with results.   Follow up plan: Return if symptoms worsen or fail to improve.  Anton Blas, MD

## 2023-08-26 NOTE — Telephone Encounter (Signed)
Plz notify I sent this in - forgot to send during OV

## 2023-08-28 LAB — URINE CULTURE
MICRO NUMBER:: 15905562
SPECIMEN QUALITY:: ADEQUATE

## 2023-09-04 ENCOUNTER — Other Ambulatory Visit: Payer: Self-pay | Admitting: Family Medicine

## 2023-09-04 DIAGNOSIS — K1120 Sialoadenitis, unspecified: Secondary | ICD-10-CM | POA: Diagnosis not present

## 2023-09-04 DIAGNOSIS — R0982 Postnasal drip: Secondary | ICD-10-CM | POA: Diagnosis not present

## 2023-09-04 DIAGNOSIS — H8110 Benign paroxysmal vertigo, unspecified ear: Secondary | ICD-10-CM | POA: Diagnosis not present

## 2023-09-04 DIAGNOSIS — F325 Major depressive disorder, single episode, in full remission: Secondary | ICD-10-CM

## 2023-09-04 DIAGNOSIS — J0191 Acute recurrent sinusitis, unspecified: Secondary | ICD-10-CM | POA: Diagnosis not present

## 2023-09-04 DIAGNOSIS — M3500 Sicca syndrome, unspecified: Secondary | ICD-10-CM | POA: Diagnosis not present

## 2023-09-04 DIAGNOSIS — K219 Gastro-esophageal reflux disease without esophagitis: Secondary | ICD-10-CM | POA: Diagnosis not present

## 2023-09-04 DIAGNOSIS — J342 Deviated nasal septum: Secondary | ICD-10-CM | POA: Diagnosis not present

## 2023-09-04 NOTE — Telephone Encounter (Signed)
 Name of Medication:  Alprazolam  Name of Pharmacy:  [facility] Last Fill or Written Date and Quantity:  04/30/23, #30 Last Office Visit and Type:  08/26/23, LLQ abd pain Next Office Visit and Type:  none  Last Controlled Substance Agreement Date:  06/28/14 Last UDS:  06/28/14

## 2023-09-07 NOTE — Telephone Encounter (Signed)
 ERx

## 2023-09-08 ENCOUNTER — Other Ambulatory Visit: Payer: Self-pay | Admitting: *Deleted

## 2023-09-08 DIAGNOSIS — Z79899 Other long term (current) drug therapy: Secondary | ICD-10-CM

## 2023-09-08 DIAGNOSIS — F329 Major depressive disorder, single episode, unspecified: Secondary | ICD-10-CM | POA: Diagnosis not present

## 2023-09-09 ENCOUNTER — Other Ambulatory Visit (INDEPENDENT_AMBULATORY_CARE_PROVIDER_SITE_OTHER): Payer: 59

## 2023-09-09 DIAGNOSIS — R350 Frequency of micturition: Secondary | ICD-10-CM | POA: Diagnosis not present

## 2023-09-09 LAB — CBC WITH DIFFERENTIAL/PLATELET
Absolute Lymphocytes: 3116 {cells}/uL (ref 850–3900)
Absolute Monocytes: 714 {cells}/uL (ref 200–950)
Basophils Absolute: 68 {cells}/uL (ref 0–200)
Basophils Relative: 0.9 %
Eosinophils Absolute: 251 {cells}/uL (ref 15–500)
Eosinophils Relative: 3.3 %
HCT: 39.4 % (ref 35.0–45.0)
Hemoglobin: 13.3 g/dL (ref 11.7–15.5)
MCH: 30.3 pg (ref 27.0–33.0)
MCHC: 33.8 g/dL (ref 32.0–36.0)
MCV: 89.7 fL (ref 80.0–100.0)
MPV: 10.1 fL (ref 7.5–12.5)
Monocytes Relative: 9.4 %
Neutro Abs: 3450 {cells}/uL (ref 1500–7800)
Neutrophils Relative %: 45.4 %
Platelets: 336 10*3/uL (ref 140–400)
RBC: 4.39 10*6/uL (ref 3.80–5.10)
RDW: 12.9 % (ref 11.0–15.0)
Total Lymphocyte: 41 %
WBC: 7.6 10*3/uL (ref 3.8–10.8)

## 2023-09-09 LAB — URINALYSIS, ROUTINE W REFLEX MICROSCOPIC
Bilirubin Urine: NEGATIVE
Hgb urine dipstick: NEGATIVE
Ketones, ur: NEGATIVE
Leukocytes,Ua: NEGATIVE
Nitrite: NEGATIVE
RBC / HPF: NONE SEEN (ref 0–?)
Specific Gravity, Urine: 1.015 (ref 1.000–1.030)
Total Protein, Urine: NEGATIVE
Urine Glucose: NEGATIVE
Urobilinogen, UA: 0.2 (ref 0.0–1.0)
WBC, UA: NONE SEEN (ref 0–?)
pH: 6.5 (ref 5.0–8.0)

## 2023-09-09 LAB — COMPLETE METABOLIC PANEL WITH GFR
AG Ratio: 1.5 (calc) (ref 1.0–2.5)
ALT: 12 U/L (ref 6–29)
AST: 18 U/L (ref 10–35)
Albumin: 4.4 g/dL (ref 3.6–5.1)
Alkaline phosphatase (APISO): 76 U/L (ref 37–153)
BUN: 22 mg/dL (ref 7–25)
CO2: 28 mmol/L (ref 20–32)
Calcium: 9.8 mg/dL (ref 8.6–10.4)
Chloride: 105 mmol/L (ref 98–110)
Creat: 1.03 mg/dL (ref 0.50–1.05)
Globulin: 2.9 g/dL (ref 1.9–3.7)
Glucose, Bld: 74 mg/dL (ref 65–99)
Potassium: 4 mmol/L (ref 3.5–5.3)
Sodium: 141 mmol/L (ref 135–146)
Total Bilirubin: 0.3 mg/dL (ref 0.2–1.2)
Total Protein: 7.3 g/dL (ref 6.1–8.1)
eGFR: 62 mL/min/{1.73_m2} (ref >=60)

## 2023-09-09 NOTE — Progress Notes (Signed)
 CBC and CMP WNL

## 2023-09-12 ENCOUNTER — Institutional Professional Consult (permissible substitution) (INDEPENDENT_AMBULATORY_CARE_PROVIDER_SITE_OTHER): Payer: 59 | Admitting: Otolaryngology

## 2023-09-12 ENCOUNTER — Encounter: Payer: Self-pay | Admitting: Family Medicine

## 2023-09-24 NOTE — Progress Notes (Unsigned)
Office Visit Note  Patient: [name]             Date of Birth: 1961-03-18           MRN: [id number]             PCP: Eustaquio Boyden, MD Referring: Eustaquio Boyden, MD Visit Date: 10/08/2023 Occupation: @GUAROCC @  Subjective:  Medication monitoring  History of Present Illness: [name] is a 63 y.o. female with history of sjogren's syndrome and osteoarthritis.  Patient was started on Plaquenil 200 mg 1 tablet by mouth twice daily after last office visit on 08/07/2023.  She has been tolerating Plaquenil without any side effects and has not had any recent gaps in therapy.  She has not yet noticed any clinical benefit since initiating Plaquenil.  She continues to experience chronic fatigue, myalgias, and arthralgias on a daily basis.  She has also noticed increased anxiety and has been taking Xanax as needed.  Her sicca symptoms have been unchanged but she states that she has noticed inflammation in her salivary glands intermittently.   Activities of Daily Living:  Patient reports morning stiffness for 1.5 hours.   Patient Reports nocturnal pain.  Difficulty dressing/grooming: Reports Difficulty climbing stairs: Reports Difficulty getting out of chair: Reports Difficulty using hands for taps, buttons, cutlery, and/or writing: Reports  Review of Systems  Constitutional:  Positive for fatigue.  HENT:  Positive for mouth dryness. Negative for mouth sores.   Eyes:  Positive for dryness.  Respiratory:  Negative for shortness of breath.   Cardiovascular:  Negative for chest pain and palpitations.  Gastrointestinal:  Positive for constipation. Negative for blood in stool and diarrhea.  Endocrine: Negative for increased urination.  Genitourinary:  Positive for involuntary urination.  Musculoskeletal:  Positive for joint pain, gait problem, joint pain, myalgias, muscle weakness, morning stiffness, muscle tenderness and myalgias. Negative for joint swelling.  Skin:   Positive for hair loss and sensitivity to sunlight. Negative for color change and rash.  Allergic/Immunologic: Positive for susceptible to infections.  Neurological:  Positive for dizziness and headaches.  Hematological:  Positive for swollen glands.  Psychiatric/Behavioral:  Positive for depressed mood and sleep disturbance. The patient is nervous/anxious.     PMFS History:  Patient Active Problem List   Diagnosis Date Noted   Left sided abdominal pain 08/26/2023   Urinary frequency 08/26/2023   Salivary gland enlargement 07/30/2023   Sinus headache 04/15/2023   Dysuria 01/08/2023   Microhematuria 01/08/2023   Skin rash 01/08/2023   Malaise and fatigue 11/07/2022   Constipation 11/07/2022   Lateral pain of right hip 11/07/2022   Gastroesophageal reflux disease 10/09/2022   Low iron stores 10/09/2022   Pre-op evaluation 10/08/2022   Bruxism 07/11/2022   Urticaria 06/20/2022   Chest pain 12/24/2021   Vaginal yeast infection 06/04/2021   Overweight (BMI 25.0-29.9) 06/04/2021   Sjogren's disease (HCC) 01/11/2021   Dyspnea on exertion 01/11/2021   Wheezing 09/27/2020   Malar rash 09/27/2020   Dry mouth 09/27/2020   Pernio, initial encounter 09/27/2020   Eosinophilic esophagitis 09/27/2020   Menopause 11/29/2019   Type 2 diabetes mellitus with other specified complication (HCC) 10/25/2019   Chronic cough 10/18/2019   Health maintenance examination 09/19/2019   Fibromyalgia 09/08/2019   Peripheral neuropathy 06/14/2019   BPV (benign positional vertigo), right 06/14/2019   Vitamin D deficiency 09/14/2018   Fatigue 10/01/2017   Generalized anxiety disorder 11/21/2015   Polyarthralgia 08/14/2015   Vitamin B12 deficiency 08/10/2014  Acute sinusitis 11/16/2012   Insomnia 06/30/2012   Migraine with status migrainosus 05/23/2011   MDD (major depressive disorder), single episode, in full remission (HCC)    Hypothyroidism due to Hashimoto's thyroiditis     Past Medical History:   Diagnosis Date   Allergic rhinitis    Anxiety    Cervical dysplasia 1990   s/p conization   Chronic sinusitis    Depression    Fibromyalgia    Hashimoto's disease    Hiatal hernia    Hip fracture, left (HCC) 2005   s/p ORIF   Hypothyroidism    Migraine    Neuropathy    Rosacea    dx by derm, per patient   Schatzki's ring    Sjogren syndrome, unspecified (HCC)     Family History  Problem Relation Age of Onset   Arthritis/Rheumatoid Mother        RA and OA   Asthma Mother    Breast cancer Maternal Aunt 48   Irregular heart beat Father        need ablation   Basal cell carcinoma Father    Asthma Father        exercise induced asthma    Arthritis Maternal Grandmother    Heart disease Maternal Grandmother    Lung cancer Paternal Grandfather    Drug abuse Brother    Alcohol abuse Brother    Appendicitis Son    Allergies Son    Migraines Son    Eczema Son    Healthy Son    Allergies Daughter    Healthy Daughter    Colon cancer Neg Hx    Pancreatic cancer Neg Hx    Stomach cancer Neg Hx    Esophageal cancer Neg Hx    Rectal cancer Neg Hx    Past Surgical History:  Procedure Laterality Date   BREAST ENHANCEMENT SURGERY  2007   BREAST IMPLANT REMOVAL Bilateral 05/21/2018   COLONOSCOPY  2014   1 benign polyp, rpt 10 yrs ([facility])   DILATION AND CURETTAGE OF UTERUS  1989   ORIF HIP FRACTURE  2005   Daldorf   TONSILLECTOMY AND ADENOIDECTOMY     TOTAL HIP ARTHROPLASTY Left 2005   second surgery    Social History   Social History Narrative   11/29/19   From: raised in [place], but in the area for 25 years   Living: with husband, Roe Coombs (1988)   Work: retired, 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)      Family: Dahlia Client and [name] - grown adults - daughter is psych PA in [place]      Enjoys: pets, photography, gardening, exercise, cooking, crafts, church activities      Exercise: cardio 3 days a week, weight lifting 2 times a week   Diet: not great, has reduced sugar       Safety   Seat belts: Yes    Guns: Yes  and secure   Safe in relationships: Yes    Immunization History  Administered Date(s) Administered   Influenza,inj,Quad PF,6+ Mos 08/10/2014, 08/14/2015, 05/08/2016, 06/23/2017, 09/14/2018, 06/01/2019, 06/04/2021, 06/19/2022   Tdap 05/01/2016   Zoster Recombinant(Shingrix) 07/23/2022, 10/08/2022     Objective: Vital Signs: BP 98/66 (BP Location: Left Arm, Patient Position: Sitting, Cuff Size: Normal)   Pulse 79   Resp 14   Ht 5\' 10"  (1.778 m)   Wt 184 lb (83.5 kg)   LMP 06/27/2019   BMI 26.40 kg/m    Physical Exam Vitals and nursing note reviewed.  Constitutional:      Appearance: She is well-developed.  HENT:     Head: Normocephalic and atraumatic.  Eyes:     Conjunctiva/sclera: Conjunctivae normal.  Cardiovascular:     Rate and Rhythm: Normal rate and regular rhythm.     Heart sounds: Normal heart sounds.  Pulmonary:     Effort: Pulmonary effort is normal.     Breath sounds: Normal breath sounds.  Abdominal:     General: Bowel sounds are normal.     Palpations: Abdomen is soft.  Musculoskeletal:     Cervical back: Normal range of motion.  Lymphadenopathy:     Cervical: No cervical adenopathy.  Skin:    General: Skin is warm and dry.     Capillary Refill: Capillary refill takes less than 2 seconds.  Neurological:     Mental Status: She is alert and oriented to person, place, and time.  Psychiatric:        Behavior: Behavior normal.      Musculoskeletal Exam: Generalized hyperalgesia.  C-spine, thoracic spine, lumbar spine and good range of motion.  Shoulder joints, elbow joints, wrist joints, MCPs, PIPs, DIPs have good range of motion with no synovitis.  Hip joints have good range of motion.  Bilateral knee crepitus noted.  No warmth or effusion of knee joints noted.  Ankle joints have good range of motion with no tenderness or joint swelling.  CDAI Exam: CDAI Score: -- Patient Global: --; Provider Global:  -- Swollen: --; Tender: -- Joint Exam 10/08/2023   No joint exam has been documented for this visit   There is currently no information documented on the homunculus. Go to the Rheumatology activity and complete the homunculus joint exam.  Investigation: No additional findings.  Imaging: No results found.  Recent Labs: Lab Results  Component Value Date   WBC 7.6 09/08/2023   HGB 13.3 09/08/2023   PLT 336 09/08/2023   NA 141 09/08/2023   K 4.0 09/08/2023   CL 105 09/08/2023   CO2 28 09/08/2023   GLUCOSE 74 09/08/2023   BUN 22 09/08/2023   CREATININE 1.03 09/08/2023   BILITOT 0.3 09/08/2023   ALKPHOS 74 08/26/2023   AST 18 09/08/2023   ALT 12 09/08/2023   PROT 7.3 09/08/2023   ALBUMIN 4.4 08/26/2023   CALCIUM 9.8 09/08/2023   GFRAA 74 11/08/2020    Speciality Comments: PLQ Eye Exam: 09/30/2023 WNL @ [facility] Follow up 1 year.  Procedures:  No procedures performed Allergies: Patient has no known allergies.    Assessment / Plan:     Visit Diagnoses: Sjogren's syndrome with other organ involvement (HCC) - +ANA 1: 80 nuclear dots, +Ro, RF-,  History of sicca symptoms, Raynaud's: Patient continues to have chronic sicca symptoms, fatigue, myalgias, and arthralgias.  Patient was initiated on Plaquenil after her last office visit on 08/07/2023.  She has been tolerating Plaquenil without any side effects and has not had any recent gaps in therapy.  She has not yet noticed any clinical benefit since initiating Plaquenil.  Since her last office visit she has actually noticed increased anxiety.  She was previously on Cymbalta which she felt helped with neuralgias and mood but discontinued due to weight gain.  She may benefit from resuming Cymbalta at a reduced dose so she plans on further discussing with her PCP.  She continues to have generalized myofascial pain which may be alleviated with the use of Cymbalta.  She remains on Lyrica as prescribed. She is willing  to give  Plaquenil more time and for [place] to reassess for the full efficacy in 2 to 3 months.  Plan to update lab work at that time. Lab work from 08/09/23 was reviewed today in the office: SPEP normal, Ro antibody positive, La antibody negative, complements WNL, and ESR WNL.   High risk medication use - Plaquenil 200 mg 1 tablet by mouth twice daily. CBC and CMP updated on 09/08/23.  PLQ Eye Exam: 09/30/2023 WNL @ [facility] Follow up 1 year.    Primary osteoarthritis of both hands: No synovitis noted on examination today.  History of hip replacement, total, left: Doing well.  Chondromalacia of patella, unspecified laterality - Followed by Dr. Jerl Santos.  Bilateral knee crepitus noted.  No warmth or effusion noted.  Chronic midline low back pain without sciatica: She continues to experience intermittent discomfort in her lower back.  Other fatigue: She continues to have chronic fatigue on a daily basis.  She has not noticed any improvement in her energy level since initiating Plaquenil.  Fibromyalgia: Patient has generalized hyperalgesia and positive tender points on exam.  She continues to experience myofascial pain and fatigue on a daily basis.  She is taking Lyrica as prescribed.  Discussed that she may benefit from reinitiating Cymbalta to help manage her myofascial pain and anxiety.  Other medical conditions are listed as follows:  Muscular deconditioning  Muscle weakness: CK was within normal limits: 83 on 08/07/2023.  Eosinophilic esophagitis  Other polyneuropathy  Excessive sweating  Vitamin B12 deficiency  Vitamin D deficiency  Hypothyroidism due to Hashimoto's thyroiditis  BPV (benign positional vertigo), right  Sternal pain - Evaluated by PCP-Dr. Milinda Antis on 12/24/21.  CXR did not reveal any active cardiopulmonary disease on 12/24/21.  EKG update on 12/24/2021 revealed normal sinus rhythm.  Orders: No orders of the defined types were placed in this encounter.  No orders of  the defined types were placed in this encounter.   Follow-Up Instructions: Return in about 2 months (around 12/06/2023) for Sjogren's syndrome, Osteoarthritis.   Gearldine Bienenstock, PA-C  Note - This record has been created using Dragon software.  Chart creation errors have been sought, but may not always  have been located. Such creation errors do not reflect on  the standard of medical care.

## 2023-09-30 DIAGNOSIS — F329 Major depressive disorder, single episode, unspecified: Secondary | ICD-10-CM | POA: Diagnosis not present

## 2023-10-06 ENCOUNTER — Other Ambulatory Visit: Payer: Self-pay | Admitting: Family Medicine

## 2023-10-06 DIAGNOSIS — F325 Major depressive disorder, single episode, in full remission: Secondary | ICD-10-CM

## 2023-10-06 NOTE — Telephone Encounter (Signed)
 Copied from CRM [id number]. Topic: Clinical - Medication Refill >> Oct 06, 2023 12:32 PM Julie Oddi wrote: Most Recent Primary Care Visit:  Provider: [facility]  Department: [facility]  Visit Type: LAB  Date: 09/09/2023  Medication: ALPRAZolam  (XANAX ) 0.25 MG tablet  Has the patient contacted their pharmacy? Yes (Agent: If no, request that the patient contact the pharmacy for the refill. If patient does not wish to contact the pharmacy document the reason why and proceed with request.) (Agent: If yes, when and what did the pharmacy advise?)  Patient was advised by pharmacy that she needed to reach out to provider to request for refill. [facility] [address] [facility]   Is this the correct pharmacy for this prescription? Yes If no, delete pharmacy and type the correct one.  This is the patient's preferred pharmacy:   [id number] [place], [place] - [address] AT [facility] [address] [place] [address] Phone: [phone] Fax: [phone]   Has the prescription been filled recently? Yes  Is the patient out of the medication? Yes  Has the patient been seen for an appointment in the last year OR does the patient have an upcoming appointment? Yes  Can we respond through MyChart? Yes  Agent: Please be advised that Rx refills may take up to 3 business days. We ask that you follow-up with your pharmacy.

## 2023-10-06 NOTE — Telephone Encounter (Signed)
 Name of Medication:  Alprazolam  Name of Pharmacy:  [facility] Last Fill or Written Date and Quantity:  09/07/23, #30 Last Office Visit and Type:  08/26/23, LLQ abd pain Next Office Visit and Type:  none  Last Controlled Substance Agreement Date:  06/28/14 Last UDS:  06/28/14

## 2023-10-08 ENCOUNTER — Encounter: Payer: Self-pay | Admitting: Physician Assistant

## 2023-10-08 ENCOUNTER — Ambulatory Visit: Payer: 59 | Attending: Physician Assistant | Admitting: Physician Assistant

## 2023-10-08 VITALS — BP 98/66 | HR 79 | Resp 14 | Ht 70.0 in | Wt 184.0 lb

## 2023-10-08 DIAGNOSIS — E063 Autoimmune thyroiditis: Secondary | ICD-10-CM

## 2023-10-08 DIAGNOSIS — M3509 Sicca syndrome with other organ involvement: Secondary | ICD-10-CM | POA: Diagnosis not present

## 2023-10-08 DIAGNOSIS — Z96642 Presence of left artificial hip joint: Secondary | ICD-10-CM

## 2023-10-08 DIAGNOSIS — G8929 Other chronic pain: Secondary | ICD-10-CM

## 2023-10-08 DIAGNOSIS — R0789 Other chest pain: Secondary | ICD-10-CM

## 2023-10-08 DIAGNOSIS — M797 Fibromyalgia: Secondary | ICD-10-CM

## 2023-10-08 DIAGNOSIS — R29898 Other symptoms and signs involving the musculoskeletal system: Secondary | ICD-10-CM | POA: Diagnosis not present

## 2023-10-08 DIAGNOSIS — G6289 Other specified polyneuropathies: Secondary | ICD-10-CM | POA: Diagnosis not present

## 2023-10-08 DIAGNOSIS — M6281 Muscle weakness (generalized): Secondary | ICD-10-CM | POA: Diagnosis not present

## 2023-10-08 DIAGNOSIS — E538 Deficiency of other specified B group vitamins: Secondary | ICD-10-CM

## 2023-10-08 DIAGNOSIS — M19042 Primary osteoarthritis, left hand: Secondary | ICD-10-CM

## 2023-10-08 DIAGNOSIS — H8111 Benign paroxysmal vertigo, right ear: Secondary | ICD-10-CM

## 2023-10-08 DIAGNOSIS — Z79899 Other long term (current) drug therapy: Secondary | ICD-10-CM

## 2023-10-08 DIAGNOSIS — M545 Low back pain, unspecified: Secondary | ICD-10-CM | POA: Diagnosis not present

## 2023-10-08 DIAGNOSIS — M2240 Chondromalacia patellae, unspecified knee: Secondary | ICD-10-CM

## 2023-10-08 DIAGNOSIS — K200 Eosinophilic esophagitis: Secondary | ICD-10-CM | POA: Diagnosis not present

## 2023-10-08 DIAGNOSIS — R61 Generalized hyperhidrosis: Secondary | ICD-10-CM

## 2023-10-08 DIAGNOSIS — E559 Vitamin D deficiency, unspecified: Secondary | ICD-10-CM

## 2023-10-08 DIAGNOSIS — M19041 Primary osteoarthritis, right hand: Secondary | ICD-10-CM | POA: Diagnosis not present

## 2023-10-08 DIAGNOSIS — R5383 Other fatigue: Secondary | ICD-10-CM | POA: Diagnosis not present

## 2023-10-08 MED ORDER — ALPRAZOLAM 0.25 MG PO TABS: 0.2500 mg | ORAL_TABLET | Freq: Two times a day (BID) | ORAL | 0 refills | Status: DC | PRN

## 2023-10-08 NOTE — Patient Instructions (Signed)
Standing Labs We placed an order today for your standing lab work.   Please have your standing labs drawn in 3 months then every 5 months   Please have your labs drawn 2 weeks prior to your appointment so that the provider can discuss your lab results at your appointment, if possible.  Please note that you may see your imaging and lab results in MyChart before we have reviewed them. We will contact you once all results are reviewed. Please allow our office up to 72 hours to thoroughly review all of the results before contacting the office for clarification of your results.  WALK-IN LAB HOURS  Monday through Thursday from 8:00 am -12:30 pm and 1:00 pm-5:00 pm and Friday from 8:00 am-12:00 pm.  Patients with office visits requiring labs will be seen before walk-in labs.  You may encounter longer than normal wait times. Please allow additional time. Wait times may be shorter on  Monday and Thursday afternoons.  We do not book appointments for walk-in labs. We appreciate your patience and understanding with our staff.   Labs are drawn by [facility]. Please bring your co-pay at the time of your lab draw.  You may receive a bill from [facility] for your lab work.  Please note if you are on Hydroxychloroquine and and an order has been placed for a Hydroxychloroquine level,  you will need to have it drawn 4 hours or more after your last dose.  If you wish to have your labs drawn at another location, please call the office 24 hours in advance so we can fax the orders.  The office is located at [address], [address], [place], [place] [address]   If you have any questions regarding directions or hours of operation,  please call [phone].   As a reminder, please drink plenty of water prior to coming for your lab work. Thanks!

## 2023-10-08 NOTE — Telephone Encounter (Signed)
ERx

## 2023-10-09 ENCOUNTER — Telehealth: Payer: Self-pay

## 2023-10-09 DIAGNOSIS — F325 Major depressive disorder, single episode, in full remission: Secondary | ICD-10-CM

## 2023-10-09 MED ORDER — ALPRAZOLAM 0.25 MG PO TABS: 0.2500 mg | ORAL_TABLET | Freq: Two times a day (BID) | ORAL | 0 refills | Status: DC | PRN

## 2023-10-09 NOTE — Addendum Note (Signed)
Addended by: Eustaquio Boyden on: 10/09/2023 11:00 AM   Modules accepted: Orders

## 2023-10-09 NOTE — Telephone Encounter (Signed)
[facility]

## 2023-10-09 NOTE — Telephone Encounter (Signed)
Copied from CRM [id number]. Topic: Clinical - Prescription Issue >> Oct 08, 2023  3:12 PM Truddie Crumble wrote: Reason for CRM: patient called stating [facility] [place] and want [facility]olam to go to [facility] instead of [facility]

## 2023-10-09 NOTE — Addendum Note (Signed)
Addended by: Nanci Pina on: 10/09/2023 10:49 AM   Modules accepted: Orders

## 2023-10-13 ENCOUNTER — Ambulatory Visit: Payer: Self-pay | Admitting: Family Medicine

## 2023-10-13 NOTE — Telephone Encounter (Signed)
Copied from CRM [id number]. Topic: Clinical - Red Word Triage >> Oct 13, 2023  9:22 AM Larwance Sachs wrote: [facility] that prompted transfer to Nurse Triage: Patient called in regarding possible bronchitis symptoms are thick mucus, chest pain and coughing, very short winded when walking and fatigue.  The patient has "chronic sinus issues with post nasal drip" so she often clears her throat in the morning but for the past 2 weeks, her cough has worsened the sputum has been beige.  She stated that beginning yesterday, she has noticed shortness of breath with exertion.  She walked about 50 yards and was winded.  She was talking on the phone with her daughter for an extensive time yesterday and noticed that she was somewhat out of breath as well.  She reported ongoing chest pain that feels like when she was previously diagnosed with costochondritis from Sjogren's.  The patient was advised to be seen in office at earliest available but she preferred to see her pcp who is more familiar with her history.  She was scheduled for a same day appointment with her pcp as requested and advised to go to the ED if shortness of breath or chest pain worsened.   Reason for Disposition  [1] MILD difficulty breathing (e.g., minimal/no SOB at rest, SOB with walking, pulse <100) AND [2] still present when not coughing  Answer Assessment - Initial Assessment Questions 1. ONSET: "When did the cough begin?"      2 weeks worsened over the last week  2. SEVERITY: "How bad is the cough today?"      Worse in the morning; feels like it has gone into my chest  3. SPUTUM: "Describe the color of your sputum" (none, dry cough; clear, white, yellow, green)     Beige  4. HEMOPTYSIS: "Are you coughing up any blood?" If so ask: "How much?" (flecks, streaks, tablespoons, etc.)     None  5. DIFFICULTY BREATHING: "Are you having difficulty breathing?" If Yes, ask: "How bad is it?" (e.g., mild, moderate, severe)    - MILD: No SOB at rest, mild SOB  with walking, speaks normally in sentences, can lie down, no retractions, pulse < 100.    - MODERATE: SOB at rest, SOB with minimal exertion and prefers to sit, cannot lie down flat, speaks in phrases, mild retractions, audible wheezing, pulse 100-120.    - SEVERE: Very SOB at rest, speaks in single words, struggling to breathe, sitting hunched forward, retractions, pulse > 120      With exertion/walking and talking a long time - onset yesterday   6. FEVER: "Do you have a fever?" If Yes, ask: "What is your temperature, how was it measured, and when did it start?"     No  7. CARDIAC HISTORY: "Do you have any history of heart disease?" (e.g., heart attack, congestive heart failure)      None  8. LUNG HISTORY: "Do you have any history of lung disease?"  (e.g., pulmonary embolus, asthma, emphysema)     None  10. OTHER SYMPTOMS: "Do you have any other symptoms?" (e.g., runny nose, wheezing, chest pain)       Chest pain - costochondritis from Sjogren's; previously had cardiac testing for same symptoms  Protocols used: Cough - Acute Productive-A-AH

## 2023-10-14 ENCOUNTER — Ambulatory Visit (INDEPENDENT_AMBULATORY_CARE_PROVIDER_SITE_OTHER)
Admission: RE | Admit: 2023-10-14 | Discharge: 2023-10-14 | Disposition: A | Discharge status: Home / Self Care | Payer: 59 | Source: Ambulatory Visit | Attending: Family Medicine | Admitting: Family Medicine

## 2023-10-14 ENCOUNTER — Ambulatory Visit (INDEPENDENT_AMBULATORY_CARE_PROVIDER_SITE_OTHER): Payer: 59 | Admitting: Family Medicine

## 2023-10-14 ENCOUNTER — Encounter: Payer: Self-pay | Admitting: Family Medicine

## 2023-10-14 VITALS — BP 128/72 | HR 99 | Temp 98.9°F | Ht 70.0 in | Wt 182.0 lb

## 2023-10-14 DIAGNOSIS — M3509 Sicca syndrome with other organ involvement: Secondary | ICD-10-CM | POA: Diagnosis not present

## 2023-10-14 DIAGNOSIS — R0600 Dyspnea, unspecified: Secondary | ICD-10-CM | POA: Diagnosis not present

## 2023-10-14 DIAGNOSIS — R052 Subacute cough: Secondary | ICD-10-CM | POA: Diagnosis not present

## 2023-10-14 DIAGNOSIS — R059 Cough, unspecified: Secondary | ICD-10-CM | POA: Insufficient documentation

## 2023-10-14 DIAGNOSIS — R0609 Other forms of dyspnea: Secondary | ICD-10-CM

## 2023-10-14 DIAGNOSIS — M2550 Pain in unspecified joint: Secondary | ICD-10-CM | POA: Diagnosis not present

## 2023-10-14 LAB — POC COVID19 BINAXNOW: SARS Coronavirus 2 Ag: NEGATIVE

## 2023-10-14 MED ORDER — PREDNISONE 20 MG PO TABS: ORAL_TABLET | ORAL | 0 refills | Status: DC

## 2023-10-14 NOTE — Assessment & Plan Note (Signed)
Now on Plaquenil.  Appreciate rheum care.

## 2023-10-14 NOTE — Assessment & Plan Note (Signed)
Has seen rheum. Briefly discussed treatment options including Celebrex - handout provided.

## 2023-10-14 NOTE — Progress Notes (Signed)
Ph: [phone] Fax: [phone]   Patient ID: [name], female    DOB: 1960-12-13, 63 y.o.   MRN: [id number]  This visit was conducted in person.  BP 128/72   Pulse 99   Temp 98.9 F (37.2 C) (Oral)   Ht 5\' 10"  (1.778 m)   Wt 182 lb (82.6 kg)   LMP 06/27/2019   SpO2 99%   BMI 26.11 kg/m    CC: cough Subjective:   HPI: [name] is a 63 y.o. female presenting on 10/14/2023 for Cough (C/o prod cough- w/ thick mucous, chest pain and SOB. Sxs started about 1 mo ago. )   1 mo h/o productive cough of thick beige mucous with chest discomfort, dyspnea, and easy fatigability. Doesn't truly "feel sick". + tooth pain. Chronic PNDrainage, hoarseness. Coughing fits in am.   No fevers/chills, ear pain, ST, body aches, loss of taste/smell.   Did not check COVID swab at home.   Regularly taking nasal saline spray, flonase, mucinex.   No long car rides or plane rides recently.  No personal or family history DVT/PE.   Sjogrens on plaquenil followed by rheum. Notes ongoing joint pains worse in the mornings especially finger stiffness.  Saw ENT - they don't treat salivary glands, would need to go to [facility] for this.  Neuropathy is better on lyrica 100mg  bid.      Relevant past medical, surgical, family and social history reviewed and updated as indicated. Interim medical history since our last visit reviewed. Allergies and medications reviewed and updated. Outpatient Medications Prior to Visit  Medication Sig Dispense Refill   ALPRAZolam (XANAX) 0.25 MG tablet Take 1 tablet (0.25 mg total) by mouth 2 (two) times daily as needed. for anxiety 30 tablet 0   ARMOUR THYROID 60 MG tablet TAKE 1 TABLET BY MOUTH DAILY BEFORE BREAKFAST 90 tablet 3   Blood Glucose Monitoring Suppl DEVI 1 each by Does not apply route in the morning, at noon, and at bedtime. May substitute to any manufacturer covered by patient's insurance. 1 each 0   CALCIUM-MAGNESIUM-ZINC PO Take by mouth  daily.     cetirizine (ZYRTEC) 10 MG tablet Take 10 mg by mouth at bedtime.     fluticasone (FLONASE) 50 MCG/ACT nasal spray Place into the nose.     hydroxychloroquine (PLAQUENIL) 200 MG tablet Take 1 tablet (200 mg total) by mouth 2 (two) times daily. 180 tablet 0   Loteprednol Etabonate (EYSUVIS OP) Apply to eye as needed.     methocarbamol (ROBAXIN) 500 MG tablet Take 1 tablet (500 mg total) by mouth 2 (two) times daily as needed for muscle spasms. 60 tablet 1   metroNIDAZOLE (METROCREAM) 0.75 % cream Apply topically as needed.     Multiple Vitamins-Minerals (CENTRUM SILVER 50+WOMEN PO) Take 1 tablet by mouth daily.     nystatin ointment (MYCOSTATIN) Apply 1 application topically 2 (two) times daily. (Patient taking differently: Apply 1 application  topically as needed.) 30 g 0   OVER THE COUNTER MEDICATION LUNA and GABA supplements     pantoprazole (PROTONIX) 40 MG tablet TAKE 1 TABLET TWICE DAILY  BEFORE MEALS. 180 tablet 3   Perfluorohexyloctane (MIEBO) 1.338 GM/ML SOLN Apply 1 drop to eye 4 (four) times daily as needed (dry eyes).     pregabalin (LYRICA) 100 MG capsule Take 1 capsule (100 mg total) by mouth 2 (two) times daily. 60 capsule 3   TRULICITY 1.5 MG/0.5ML SOAJ Inject into the skin.  cephALEXin (KEFLEX) 500 MG capsule Take 1 capsule (500 mg total) by mouth 2 (two) times daily. (Patient not taking: Reported on 10/08/2023) 14 capsule 0   Dulaglutide (TRULICITY) 3 MG/0.5ML SOAJ Inject 3 mg as directed once a week. (Patient not taking: Reported on 10/08/2023) 2 mL 3   No facility-administered medications prior to visit.     Per HPI unless specifically indicated in ROS section below Review of Systems  Objective:  BP 128/72   Pulse 99   Temp 98.9 F (37.2 C) (Oral)   Ht 5\' 10"  (1.778 m)   Wt 182 lb (82.6 kg)   LMP 06/27/2019   SpO2 99%   BMI 26.11 kg/m   Wt Readings from Last 3 Encounters:  10/14/23 182 lb (82.6 kg)  10/08/23 184 lb (83.5 kg)  08/26/23 180 lb (81.6  kg)      Physical Exam Vitals and nursing note reviewed.  Constitutional:      Appearance: Normal appearance. She is not ill-appearing.  HENT:     Head: Normocephalic and atraumatic.     Right Ear: Tympanic membrane, ear canal and external ear normal. There is no impacted cerumen.     Left Ear: Tympanic membrane, ear canal and external ear normal. There is no impacted cerumen.     Nose: Nose normal. No congestion or rhinorrhea.     Right Sinus: No maxillary sinus tenderness or frontal sinus tenderness.     Left Sinus: No maxillary sinus tenderness or frontal sinus tenderness.     Mouth/Throat:     Mouth: Mucous membranes are moist.     Pharynx: Oropharynx is clear. No oropharyngeal exudate or posterior oropharyngeal erythema.  Eyes:     Extraocular Movements: Extraocular movements intact.     Conjunctiva/sclera: Conjunctivae normal.     Pupils: Pupils are equal, round, and reactive to light.  Cardiovascular:     Rate and Rhythm: Normal rate and regular rhythm.     Pulses: Normal pulses.     Heart sounds: Normal heart sounds. No murmur heard. Pulmonary:     Effort: Pulmonary effort is normal. No respiratory distress.     Breath sounds: Normal breath sounds. No wheezing, rhonchi or rales.     Comments: Lungs clear Musculoskeletal:     Cervical back: Normal range of motion and neck supple.     Right lower leg: No edema.     Left lower leg: No edema.  Lymphadenopathy:     Head:     Right side of head: No submental, submandibular, tonsillar, preauricular or posterior auricular adenopathy.     Left side of head: No submental, submandibular, tonsillar, preauricular or posterior auricular adenopathy.     Cervical: Cervical adenopathy (superficial AC bliaterally) present.     Right cervical: No superficial cervical adenopathy.    Left cervical: No superficial cervical adenopathy.     Upper Body:     Right upper body: No supraclavicular adenopathy.     Left upper body: No  supraclavicular adenopathy.  Skin:    Findings: No rash.  Neurological:     Mental Status: She is alert.  Psychiatric:        Mood and Affect: Mood normal.        Behavior: Behavior normal.       Results for orders placed or performed in visit on 10/14/23  POC COVID-19 BinaxNow   Collection Time: 10/14/23  2:52 PM  Result Value Ref Range   SARS Coronavirus 2 Ag Negative Negative  Assessment & Plan:   Problem List Items Addressed This Visit     Polyarthralgia   Has seen rheum. Briefly discussed treatment options including Celebrex - handout provided.       Sjogren's disease (HCC)   Now on Plaquenil.  Appreciate rheum care.       Cough - Primary   Check COVID swab today.  Check CXR for endorsed exertional dyspnea.  Suspect post-viral/post-infectious cough with residual dyspnea. ?post-covid Not consistent with PE. No signs of bacterial infection/pneumonia - no need for abx at this time.  Rx prednisone taper.  Declines cough syrup  Update if ongoing symptoms despite treatment.       Relevant Orders   POC COVID-19 BinaxNow (Completed)   Other Visit Diagnoses       Exertional dyspnea       Relevant Orders   DG Chest 2 View   POC COVID-19 BinaxNow (Completed)        Meds ordered this encounter  Medications   predniSONE (DELTASONE) 20 MG tablet    Sig: Take two tablets daily for 3 days followed by one tablet daily for 4 days    Dispense:  10 tablet    Refill:  0    Orders Placed This Encounter  Procedures   DG Chest 2 View    Standing Status:   Future    Number of Occurrences:   1    Expiration Date:   10/13/2024    Reason for Exam (SYMPTOM  OR DIAGNOSIS REQUIRED):   dyspnea x12mo after respiratory infection    Is patient pregnant?:   No    Preferred imaging location?:   [facility]   POC COVID-19 BinaxNow    Previously tested for COVID-19:   Yes    Resident in a congregate (group) care setting:   No    Employed in healthcare setting:   No     Pregnant:   No    Patient Instructions  Lungs sound good today Xray today  Anticipate post-infectious cough after recent bronchitis.  May take prednisone taper sent to pharmacy Let us know if not improved after treatment.   Follow up plan: No follow-ups on file.  Eustaquio Boyden, MD

## 2023-10-14 NOTE — Patient Instructions (Addendum)
Lungs sound good today Xray today  Anticipate post-infectious cough after recent bronchitis.  May take prednisone taper sent to pharmacy Let us know if not improved after treatment.

## 2023-10-14 NOTE — Assessment & Plan Note (Addendum)
Check COVID swab today.  Check CXR for endorsed exertional dyspnea.  Suspect post-viral/post-infectious cough with residual dyspnea. ?post-covid Not consistent with PE. No signs of bacterial infection/pneumonia - no need for abx at this time.  Rx prednisone taper.  Declines cough syrup  Update if ongoing symptoms despite treatment.

## 2023-10-27 ENCOUNTER — Encounter: Payer: Self-pay | Admitting: Family Medicine

## 2023-10-27 DIAGNOSIS — F329 Major depressive disorder, single episode, unspecified: Secondary | ICD-10-CM | POA: Diagnosis not present

## 2023-10-27 DIAGNOSIS — R918 Other nonspecific abnormal finding of lung field: Secondary | ICD-10-CM

## 2023-10-30 ENCOUNTER — Other Ambulatory Visit: Payer: Self-pay | Admitting: Family Medicine

## 2023-10-30 DIAGNOSIS — E1169 Type 2 diabetes mellitus with other specified complication: Secondary | ICD-10-CM

## 2023-10-31 NOTE — Telephone Encounter (Signed)
 Trulicity Last filled:  10/03/23 Last OV:  10/14/23, subacute cough Next OV:  none

## 2023-11-03 ENCOUNTER — Other Ambulatory Visit: Payer: Self-pay | Admitting: Physician Assistant

## 2023-11-03 NOTE — Telephone Encounter (Signed)
 Refilled Plz schedule DM f/u visit as due.  Would also have her check her CXR results message which I sent but not sure she read.

## 2023-11-03 NOTE — Telephone Encounter (Signed)
 Last Fill: 08/08/2023  Eye exam: 09/30/2023 WNL   Labs: 09/08/2023 CBC and CMP WNL   Next Visit: 12/17/2023  Last Visit: 10/08/2023   WJ:[id number] syndrome with other organ involvement [facility])   Current Dose per office note 10/08/2023: Plaquenil 200 mg 1 tablet by mouth twice daily.   Okay to refill Plaquenil?

## 2023-11-04 ENCOUNTER — Encounter: Payer: Self-pay | Admitting: Family Medicine

## 2023-11-04 DIAGNOSIS — R911 Solitary pulmonary nodule: Secondary | ICD-10-CM | POA: Insufficient documentation

## 2023-11-04 DIAGNOSIS — R918 Other nonspecific abnormal finding of lung field: Secondary | ICD-10-CM | POA: Insufficient documentation

## 2023-11-04 NOTE — Telephone Encounter (Addendum)
 Spoke with pt relaying Dr Timoteo Expose messages and scheduled DM f/u OV on 11/25/23 at 12:30. Pt verbalizes understanding and will check MyChart messages for CXR results.

## 2023-11-06 ENCOUNTER — Encounter: Payer: Self-pay | Admitting: *Deleted

## 2023-11-10 DIAGNOSIS — F329 Major depressive disorder, single episode, unspecified: Secondary | ICD-10-CM | POA: Diagnosis not present

## 2023-11-17 DIAGNOSIS — F329 Major depressive disorder, single episode, unspecified: Secondary | ICD-10-CM | POA: Diagnosis not present

## 2023-11-18 DIAGNOSIS — D485 Neoplasm of uncertain behavior of skin: Secondary | ICD-10-CM | POA: Diagnosis not present

## 2023-11-18 DIAGNOSIS — D2261 Melanocytic nevi of right upper limb, including shoulder: Secondary | ICD-10-CM | POA: Diagnosis not present

## 2023-11-18 DIAGNOSIS — L718 Other rosacea: Secondary | ICD-10-CM | POA: Diagnosis not present

## 2023-11-18 DIAGNOSIS — D0439 Carcinoma in situ of skin of other parts of face: Secondary | ICD-10-CM | POA: Diagnosis not present

## 2023-11-18 DIAGNOSIS — D225 Melanocytic nevi of trunk: Secondary | ICD-10-CM | POA: Diagnosis not present

## 2023-11-18 DIAGNOSIS — D2262 Melanocytic nevi of left upper limb, including shoulder: Secondary | ICD-10-CM | POA: Diagnosis not present

## 2023-11-18 DIAGNOSIS — D2271 Melanocytic nevi of right lower limb, including hip: Secondary | ICD-10-CM | POA: Diagnosis not present

## 2023-11-18 DIAGNOSIS — L821 Other seborrheic keratosis: Secondary | ICD-10-CM | POA: Diagnosis not present

## 2023-11-18 DIAGNOSIS — C44519 Basal cell carcinoma of skin of other part of trunk: Secondary | ICD-10-CM | POA: Diagnosis not present

## 2023-11-18 DIAGNOSIS — D2272 Melanocytic nevi of left lower limb, including hip: Secondary | ICD-10-CM | POA: Diagnosis not present

## 2023-11-20 ENCOUNTER — Other Ambulatory Visit: Payer: Self-pay | Admitting: Family Medicine

## 2023-11-20 DIAGNOSIS — F325 Major depressive disorder, single episode, in full remission: Secondary | ICD-10-CM

## 2023-11-20 NOTE — Telephone Encounter (Signed)
 Name of Medication:  Alprazolam Name of Pharmacy:  [facility] Last Fill or Written Date and Quantity:  10/21/23, #30 Last Office Visit and Type:  10/14/23, subacute cough Next Office Visit and Type:  11/25/23,  DM f/u Last Controlled Substance Agreement Date:  06/28/14 Last UDS:  06/28/14

## 2023-11-21 NOTE — Telephone Encounter (Signed)
 ERx

## 2023-11-24 DIAGNOSIS — F329 Major depressive disorder, single episode, unspecified: Secondary | ICD-10-CM | POA: Diagnosis not present

## 2023-11-25 ENCOUNTER — Ambulatory Visit (INDEPENDENT_AMBULATORY_CARE_PROVIDER_SITE_OTHER): Admitting: Family Medicine

## 2023-11-25 ENCOUNTER — Encounter: Payer: Self-pay | Admitting: Family Medicine

## 2023-11-25 VITALS — BP 108/64 | HR 85 | Temp 98.5°F | Ht 70.0 in | Wt 186.0 lb

## 2023-11-25 DIAGNOSIS — G6289 Other specified polyneuropathies: Secondary | ICD-10-CM

## 2023-11-25 DIAGNOSIS — E1169 Type 2 diabetes mellitus with other specified complication: Secondary | ICD-10-CM | POA: Diagnosis not present

## 2023-11-25 DIAGNOSIS — R911 Solitary pulmonary nodule: Secondary | ICD-10-CM

## 2023-11-25 DIAGNOSIS — Z7985 Long-term (current) use of injectable non-insulin antidiabetic drugs: Secondary | ICD-10-CM | POA: Diagnosis not present

## 2023-11-25 DIAGNOSIS — M1711 Unilateral primary osteoarthritis, right knee: Secondary | ICD-10-CM | POA: Diagnosis not present

## 2023-11-25 DIAGNOSIS — W19XXXA Unspecified fall, initial encounter: Secondary | ICD-10-CM | POA: Insufficient documentation

## 2023-11-25 DIAGNOSIS — M170 Bilateral primary osteoarthritis of knee: Secondary | ICD-10-CM | POA: Diagnosis not present

## 2023-11-25 DIAGNOSIS — M3509 Sicca syndrome with other organ involvement: Secondary | ICD-10-CM | POA: Diagnosis not present

## 2023-11-25 DIAGNOSIS — M797 Fibromyalgia: Secondary | ICD-10-CM | POA: Diagnosis not present

## 2023-11-25 DIAGNOSIS — M545 Low back pain, unspecified: Secondary | ICD-10-CM | POA: Diagnosis not present

## 2023-11-25 DIAGNOSIS — M1712 Unilateral primary osteoarthritis, left knee: Secondary | ICD-10-CM | POA: Diagnosis not present

## 2023-11-25 LAB — POCT GLYCOSYLATED HEMOGLOBIN (HGB A1C): Hemoglobin A1C: 5.5 % (ref 4.0–5.6)

## 2023-11-25 MED ORDER — PREGABALIN 100 MG PO CAPS: 100.0000 mg | ORAL_CAPSULE | Freq: Three times a day (TID) | ORAL | 5 refills | Status: DC

## 2023-11-25 NOTE — Assessment & Plan Note (Signed)
 Overall stable period on lyrica 100mg  bid - see above re: increased dose.

## 2023-11-25 NOTE — Patient Instructions (Addendum)
 Try increasing lyrica to 100mg  three times daily for a few weeks.  Call to schedule lung CT scan.  A1c is doing well today - continue trulicity.  Return in 5 months for physical

## 2023-11-25 NOTE — Assessment & Plan Note (Signed)
 See below

## 2023-11-25 NOTE — Assessment & Plan Note (Signed)
 Chronic, great control only on trulicity 1.5mg  weekly - continue.

## 2023-11-25 NOTE — Progress Notes (Signed)
 Ph: [phone] Fax: [phone]   Patient ID: [name], female    DOB: July 01, 1961, 63 y.o.   MRN: [id number]  This visit was conducted in person.  BP 108/64   Pulse 85   Temp 98.5 F (36.9 C) (Oral)   Ht 5\' 10"  (1.778 m)   Wt 186 lb (84.4 kg)   LMP 06/27/2019   SpO2 98%   BMI 26.69 kg/m    CC: 6 mo DM f/u visit  Subjective:   HPI: [name] is a 63 y.o. female presenting on 11/25/2023 for Medical Management of Chronic Issues (Here for DM f/u. ) and Fall (C/o fall about 2 wks ago. C/o burning pain and tingling in B arms. Seen by ortho this AM. )   She had a fall while working in her yard 2 wks ago - trackhoe cab her husband was operating hit her on R side and she landed on her left left side. Ongoing back ache, as well as searing burning pain to forearms "like fibromyalgia pain" - managing with ibuprofen and muscle relaxant. Notes ongoing paresthesias from known neuropathy - she asks about increasing lyrica to TID. She even tried [place] for neuropathy without much benefit.   DM - does not regularly check sugars. Compliant with antihyperglycemic regimen which includes: trulicity 1.5mg  weekly. Metformin stopped 12/2022. Denies low sugars or hypoglycemic symptoms. Denies paresthesias, blurry vision. Last diabetic eye exam - [facility] - will request records as unsure if she was screened for DR. Glucometer brand: doesn't have one. Last foot exam: DUE. DSME: has not had.  Lab Results  Component Value Date   HGBA1C 5.5 11/25/2023   Diabetic Foot Exam - Simple   Simple Foot Form Diabetic Foot exam was performed with the following findings: Yes 11/25/2023  1:13 PM  Visual Inspection No deformities, no ulcerations, no other skin breakdown bilaterally: Yes Sensation Testing See comments: Yes Pulse Check Posterior Tibialis and Dorsalis pulse intact bilaterally: Yes Comments No claudication Diminished sensation to monofilament testing    No results found for:  "MICROALBUR", "[id number]"   Known sjogren's on plaquenil.   Planning to try visco-supplementation injections to knees if insurance covers this.   Developed subacute cough seen 09/2023, thought post-infectious cough after bronchitis, treated with prednisone taper with benefit. Notes ongoing drainage.   RLL pulmonary nodules on pulm eval 2023 - never completed f/u CT/ pulm f/u.  Recommended repeat imaging - she will call to schedule.   CT chest high resolution 02/2022:  Adjacent subpleural nodules of the right lung base overlying the right hemidiaphragm, measuring 1.5 x 1.0 cm and 0.9 x 0.7 cm. These are new compared to remote prior examination of the lung bases date 02/14/2010, nonspecific. Consider one of the following in 3 months for both low-risk and high-risk individuals: (a) repeat chest CT, (b) follow-up PET-CT, or (c) tissue sampling.      Relevant past medical, surgical, family and social history reviewed and updated as indicated. Interim medical history since our last visit reviewed. Allergies and medications reviewed and updated. Outpatient Medications Prior to Visit  Medication Sig Dispense Refill   ALPRAZolam (XANAX) 0.25 MG tablet TAKE 1 TABLET BY MOUTH 2 (TWO) TIMES DAILY AS NEEDED. FOR ANXIETY 30 tablet 0   ARMOUR THYROID 60 MG tablet TAKE 1 TABLET BY MOUTH DAILY BEFORE BREAKFAST 90 tablet 3   Blood Glucose Monitoring Suppl DEVI 1 each by Does not apply route in the morning, at noon, and at bedtime. May substitute to  any manufacturer covered by [employer]. 1 each 0   CALCIUM-MAGNESIUM-ZINC PO Take by mouth daily.     cetirizine (ZYRTEC) 10 MG tablet Take 10 mg by mouth at bedtime.     Dulaglutide (TRULICITY) 1.5 MG/0.5ML SOAJ INJECT 1.5MG  SUBCUTANEOUSLY ONCE WEEKLY 2 mL 6   fluticasone (FLONASE) 50 MCG/ACT nasal spray Place into the nose.     hydroxychloroquine (PLAQUENIL) 200 MG tablet TAKE 1 TABLET BY MOUTH TWICE A DAY 60 tablet 2   IVERMECTIN EX Apply topically. Gel      Loteprednol Etabonate (EYSUVIS OP) Apply to eye as needed.     methocarbamol (ROBAXIN) 500 MG tablet Take 1 tablet (500 mg total) by mouth 2 (two) times daily as needed for muscle spasms. 60 tablet 1   Multiple Vitamins-Minerals (CENTRUM SILVER 50+WOMEN PO) Take 1 tablet by mouth daily.     nystatin ointment (MYCOSTATIN) Apply 1 application topically 2 (two) times daily. (Patient taking differently: Apply 1 application  topically as needed.) 30 g 0   OVER THE COUNTER MEDICATION LUNA and GABA supplements     pantoprazole (PROTONIX) 40 MG tablet TAKE 1 TABLET TWICE DAILY  BEFORE MEALS. 180 tablet 3   Perfluorohexyloctane (MIEBO) 1.338 GM/ML SOLN Apply 1 drop to eye 4 (four) times daily as needed (dry eyes).     pregabalin (LYRICA) 100 MG capsule Take 1 capsule (100 mg total) by mouth 2 (two) times daily. 60 capsule 3   metroNIDAZOLE (METROCREAM) 0.75 % cream Apply topically as needed.     predniSONE (DELTASONE) 20 MG tablet Take two tablets daily for 3 days followed by one tablet daily for 4 days 10 tablet 0   No facility-administered medications prior to visit.     Per HPI unless specifically indicated in ROS section below Review of Systems  Objective:  BP 108/64   Pulse 85   Temp 98.5 F (36.9 C) (Oral)   Ht 5\' 10"  (1.778 m)   Wt 186 lb (84.4 kg)   LMP 06/27/2019   SpO2 98%   BMI 26.69 kg/m   Wt Readings from Last 3 Encounters:  11/25/23 186 lb (84.4 kg)  10/14/23 182 lb (82.6 kg)  10/08/23 184 lb (83.5 kg)      Physical Exam Vitals and nursing note reviewed.  Constitutional:      Appearance: Normal appearance. She is not ill-appearing.  Eyes:     Extraocular Movements: Extraocular movements intact.     Conjunctiva/sclera: Conjunctivae normal.     Pupils: Pupils are equal, round, and reactive to light.  Cardiovascular:     Rate and Rhythm: Normal rate and regular rhythm.     Pulses: Normal pulses.     Heart sounds: Normal heart sounds. No murmur heard. Pulmonary:      Effort: Pulmonary effort is normal. No respiratory distress.     Breath sounds: Normal breath sounds. No wheezing, rhonchi or rales.  Musculoskeletal:     Right lower leg: No edema.     Left lower leg: No edema.  Skin:    General: Skin is warm and dry.     Findings: No rash.  Neurological:     Mental Status: She is alert.  Psychiatric:        Mood and Affect: Mood normal.        Behavior: Behavior normal.       Results for orders placed or performed in visit on 11/25/23  POCT glycosylated hemoglobin (Hb A1C)   Collection Time: 11/25/23 12:18 PM  Result Value Ref Range   Hemoglobin A1C 5.5 4.0 - 5.6 %   HbA1c POC (<> result, manual entry)     HbA1c, POC (prediabetic range)     HbA1c, POC (controlled diabetic range)      Assessment & Plan:   Problem List Items Addressed This Visit     Peripheral neuropathy   Chronic, ?sjogren related.  Now on lyrica 100mg  bid Notes worsening pain after recent TrackHoe accident in her yard, already on tylenol, ibuprofen, robaxin - will increase lyrica to 100mg  TID temporarily with goal to return to BID dosing in a few weeks, she will let me know if doing much better and we could consider continuing higher dose.       Relevant Medications   pregabalin (LYRICA) 100 MG capsule   Fibromyalgia   Overall stable period on lyrica 100mg  bid - see above re: increased dose.       Relevant Medications   pregabalin (LYRICA) 100 MG capsule   Type 2 diabetes mellitus with other specified complication (HCC) - Primary   Chronic, great control only on trulicity 1.5mg  weekly - continue.       Relevant Orders   POCT glycosylated hemoglobin (Hb A1C) (Completed)   Sjogren's disease (HCC)   On plaquenil followed by rheum Discussing possible raynaud's disease      Pulmonary nodule, right   Discussed with patient pulm nodules to RLL noted 02/2022. Overdue for recheck - ordered.  She will contact imaging center to get this scheduled      Fall with  injury   See below        Meds ordered this encounter  Medications   pregabalin (LYRICA) 100 MG capsule    Sig: Take 1 capsule (100 mg total) by mouth 3 (three) times daily.    Dispense:  90 capsule    Refill:  5    Note new instructions    Orders Placed This Encounter  Procedures   POCT glycosylated hemoglobin (Hb A1C)    Patient Instructions  Try increasing lyrica to 100mg  three times daily for a few weeks.  Call to schedule lung CT scan.  A1c is doing well today - continue trulicity.  Return in 5 months for physical   Follow up plan: Return in about 5 months (around 04/26/2024), or if symptoms worsen or fail to improve, for annual exam, prior fasting for blood work.  Eustaquio Boyden, MD

## 2023-11-25 NOTE — Assessment & Plan Note (Signed)
 Discussed with patient pulm nodules to RLL noted 02/2022. Overdue for recheck - ordered.  She will contact imaging center to get this scheduled

## 2023-11-25 NOTE — Assessment & Plan Note (Signed)
 On plaquenil followed by rheum Discussing possible raynaud's disease

## 2023-11-25 NOTE — Assessment & Plan Note (Signed)
 Chronic, ?sjogren related.  Now on lyrica 100mg  bid Notes worsening pain after recent TrackHoe accident in her yard, already on tylenol, ibuprofen, robaxin - will increase lyrica to 100mg  TID temporarily with goal to return to BID dosing in a few weeks, she will let me know if doing much better and we could consider continuing higher dose.

## 2023-12-02 DIAGNOSIS — F329 Major depressive disorder, single episode, unspecified: Secondary | ICD-10-CM | POA: Diagnosis not present
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
## 2023-12-15 ENCOUNTER — Telehealth: Payer: Self-pay

## 2023-12-15 ENCOUNTER — Other Ambulatory Visit (HOSPITAL_COMMUNITY): Payer: Self-pay

## 2023-12-15 NOTE — Telephone Encounter (Signed)
 Pharmacy Patient Advocate Encounter   Received notification from CoverMyMeds that prior authorization for Pregabalin  100MG  capsules is required/requested.   Insurance verification completed.   The patient is insured through [facility] .   Per test claim: PA required; PA started via CoverMyMeds. KEY [id number] . Waiting for clinical questions to populate.

## 2023-12-16 NOTE — Telephone Encounter (Signed)
 Pharmacy Patient Advocate Encounter  Received notification from [facility] that Prior Authorization for Pregabalin  100MG  capsules has been APPROVED from 12/15/2023 to 12/14/2024   PA #/Case ID/Reference #: [id number]

## 2023-12-16 NOTE — Telephone Encounter (Signed)
 Noted.

## 2023-12-17 ENCOUNTER — Ambulatory Visit: Payer: 59 | Attending: Physician Assistant | Admitting: Physician Assistant

## 2023-12-17 ENCOUNTER — Encounter: Payer: Self-pay | Admitting: Physician Assistant

## 2023-12-17 VITALS — BP 100/62 | HR 64 | Resp 14 | Ht 70.0 in | Wt 185.0 lb

## 2023-12-17 DIAGNOSIS — E559 Vitamin D deficiency, unspecified: Secondary | ICD-10-CM

## 2023-12-17 DIAGNOSIS — G8929 Other chronic pain: Secondary | ICD-10-CM

## 2023-12-17 DIAGNOSIS — K200 Eosinophilic esophagitis: Secondary | ICD-10-CM | POA: Diagnosis not present

## 2023-12-17 DIAGNOSIS — R5383 Other fatigue: Secondary | ICD-10-CM

## 2023-12-17 DIAGNOSIS — R0789 Other chest pain: Secondary | ICD-10-CM

## 2023-12-17 DIAGNOSIS — M6281 Muscle weakness (generalized): Secondary | ICD-10-CM

## 2023-12-17 DIAGNOSIS — Z96642 Presence of left artificial hip joint: Secondary | ICD-10-CM

## 2023-12-17 DIAGNOSIS — M2240 Chondromalacia patellae, unspecified knee: Secondary | ICD-10-CM

## 2023-12-17 DIAGNOSIS — R61 Generalized hyperhidrosis: Secondary | ICD-10-CM

## 2023-12-17 DIAGNOSIS — M3509 Sicca syndrome with other organ involvement: Secondary | ICD-10-CM

## 2023-12-17 DIAGNOSIS — E063 Autoimmune thyroiditis: Secondary | ICD-10-CM

## 2023-12-17 DIAGNOSIS — M19041 Primary osteoarthritis, right hand: Secondary | ICD-10-CM

## 2023-12-17 DIAGNOSIS — R29898 Other symptoms and signs involving the musculoskeletal system: Secondary | ICD-10-CM

## 2023-12-17 DIAGNOSIS — H8111 Benign paroxysmal vertigo, right ear: Secondary | ICD-10-CM

## 2023-12-17 DIAGNOSIS — Z79899 Other long term (current) drug therapy: Secondary | ICD-10-CM | POA: Diagnosis not present

## 2023-12-17 DIAGNOSIS — M545 Low back pain, unspecified: Secondary | ICD-10-CM

## 2023-12-17 DIAGNOSIS — M19042 Primary osteoarthritis, left hand: Secondary | ICD-10-CM

## 2023-12-17 DIAGNOSIS — M797 Fibromyalgia: Secondary | ICD-10-CM

## 2023-12-17 DIAGNOSIS — G6289 Other specified polyneuropathies: Secondary | ICD-10-CM

## 2023-12-17 DIAGNOSIS — E538 Deficiency of other specified B group vitamins: Secondary | ICD-10-CM

## 2023-12-17 NOTE — Progress Notes (Signed)
 CBC WNL

## 2023-12-18 NOTE — Progress Notes (Signed)
 Creatinine is elevated-1.15 and GFR is low-54.  Rest of CMP WNL.  Patient should discuss reducing the dose of meloxicam with her PCP or only taking meloxicam PRN.   ESR WNL UA normal.

## 2023-12-18 NOTE — Progress Notes (Signed)
Complements WNL

## 2023-12-18 NOTE — Addendum Note (Signed)
 Addended by: Dee Farber on: 12/18/2023 07:58 AM   Modules accepted: Orders

## 2023-12-19 NOTE — Progress Notes (Signed)
 Ro antibody negative

## 2023-12-20 LAB — ANTI-NUCLEAR AB-TITER (ANA TITER): ANA Titer 1: 1:320 {titer} — ABNORMAL HIGH

## 2023-12-20 LAB — COMPREHENSIVE METABOLIC PANEL WITH GFR
AG Ratio: 1.5 (calc) (ref 1.0–2.5)
ALT: 18 U/L (ref 6–29)
AST: 24 U/L (ref 10–35)
Albumin: 4.2 g/dL (ref 3.6–5.1)
Alkaline phosphatase (APISO): 63 U/L (ref 37–153)
BUN/Creatinine Ratio: 23 (calc) — ABNORMAL HIGH (ref 6–22)
BUN: 27 mg/dL — ABNORMAL HIGH (ref 7–25)
CO2: 26 mmol/L (ref 20–32)
Calcium: 9.8 mg/dL (ref 8.6–10.4)
Chloride: 107 mmol/L (ref 98–110)
Creat: 1.15 mg/dL — ABNORMAL HIGH (ref 0.50–1.05)
Globulin: 2.8 g/dL (ref 1.9–3.7)
Glucose, Bld: 80 mg/dL (ref 65–99)
Potassium: 5 mmol/L (ref 3.5–5.3)
Sodium: 139 mmol/L (ref 135–146)
Total Bilirubin: 0.4 mg/dL (ref 0.2–1.2)
Total Protein: 7 g/dL (ref 6.1–8.1)
eGFR: 54 mL/min/{1.73_m2} — ABNORMAL LOW (ref >=60)

## 2023-12-20 LAB — CBC WITH DIFFERENTIAL/PLATELET
Absolute Lymphocytes: 2003 {cells}/uL (ref 850–3900)
Absolute Monocytes: 535 {cells}/uL (ref 200–950)
Basophils Absolute: 69 {cells}/uL (ref 0–200)
Basophils Relative: 1.3 %
Eosinophils Absolute: 461 {cells}/uL (ref 15–500)
Eosinophils Relative: 8.7 %
HCT: 37.4 % (ref 35.0–45.0)
Hemoglobin: 12.5 g/dL (ref 11.7–15.5)
MCH: 30.3 pg (ref 27.0–33.0)
MCHC: 33.4 g/dL (ref 32.0–36.0)
MCV: 90.8 fL (ref 80.0–100.0)
MPV: 10.3 fL (ref 7.5–12.5)
Monocytes Relative: 10.1 %
Neutro Abs: 2231 {cells}/uL (ref 1500–7800)
Neutrophils Relative %: 42.1 %
Platelets: 256 10*3/uL (ref 140–400)
RBC: 4.12 10*6/uL (ref 3.80–5.10)
RDW: 13.4 % (ref 11.0–15.0)
Total Lymphocyte: 37.8 %
WBC: 5.3 10*3/uL (ref 3.8–10.8)

## 2023-12-20 LAB — URINALYSIS, ROUTINE W REFLEX MICROSCOPIC
Bilirubin Urine: NEGATIVE
Glucose, UA: NEGATIVE
Hgb urine dipstick: NEGATIVE
Ketones, ur: NEGATIVE
Leukocytes,Ua: NEGATIVE
Nitrite: NEGATIVE
Protein, ur: NEGATIVE
Specific Gravity, Urine: 1.013 (ref 1.001–1.035)
pH: 5.5 (ref 5.0–8.0)

## 2023-12-20 LAB — C3 AND C4
C3 Complement: 126 mg/dL (ref 83–193)
C4 Complement: 20 mg/dL (ref 15–57)

## 2023-12-20 LAB — SEDIMENTATION RATE: Sed Rate: 2 mm/h (ref 0–30)

## 2023-12-20 LAB — ANA: Anti Nuclear Antibody (ANA): POSITIVE — AB

## 2023-12-20 LAB — SJOGRENS SYNDROME-A EXTRACTABLE NUCLEAR ANTIBODY: SSA (Ro) (ENA) Antibody, IgG: <1 AI

## 2023-12-22 NOTE — Progress Notes (Signed)
 ANA remains positive.

## 2023-12-22 NOTE — Telephone Encounter (Signed)
 She has previously been checked for RA, lupus, and mixed connective tissue disease.  As of now her symptoms are consistent with undifferentiated connective tissue disease.    Please clarify if she would like a referral for a second opinion?

## 2023-12-22 NOTE — Progress Notes (Signed)
 Plaquenil  as the least amount of side effects.  More aggressive immunosuppression is not needed at this time since the rest of the labs are negative

## 2023-12-22 NOTE — Telephone Encounter (Signed)
 We usually use plaquenil  to try UCTD but if she is concerned about her diagnosis she can go for a second opinion

## 2023-12-24 ENCOUNTER — Other Ambulatory Visit: Payer: Self-pay | Admitting: Family Medicine

## 2023-12-24 DIAGNOSIS — F325 Major depressive disorder, single episode, in full remission: Secondary | ICD-10-CM

## 2023-12-24 NOTE — Telephone Encounter (Signed)
 ERx

## 2023-12-24 NOTE — Telephone Encounter (Signed)
 Name of Medication:  Alprazolam  Name of Pharmacy:  [facility] Last Fill or Written Date and Quantity:  10/21/23, #30 Last Office Visit and Type:  11/25/23,  DM f/u Next Office Visit and Type:  none Last Controlled Substance Agreement Date:  06/28/14 Last UDS:  06/28/14

## 2023-12-25 NOTE — Addendum Note (Signed)
 Addended by: Claire Crick on: 12/25/2023 09:28 AM   Modules accepted: Orders

## 2023-12-29 ENCOUNTER — Ambulatory Visit: Payer: Self-pay

## 2023-12-29 NOTE — Telephone Encounter (Signed)
Called pt no answer left voicemail.

## 2023-12-29 NOTE — Telephone Encounter (Signed)
 Copied from CRM [id number]. Topic: Clinical - Medical Advice >> Dec 29, 2023 10:24 AM Everlene Hobby D wrote: Patient wants to speak with a nurse about her medication for neuropathy

## 2023-12-29 NOTE — Telephone Encounter (Signed)
 Chief Complaint: neuropathy Symptoms: pain in feet and legs Frequency: increasing for weeks  Disposition: [] ED /[] Urgent Care (no appt availability in office) / [] Appointment(In office/virtual)/ []  [facility]/ [x] Home Care/ [] Refused Recommended Disposition /[] [facility]/ [x]  Follow-up with PCP Additional Notes: pt states that she is currently taking lyrica  and her neuropathy pain is currently breaking through. States pain in her feet is currently 8/10 and states lots of breakthrough pain. Pt states she is currently only taking it twice but it is writing for 3 times a day. Instructed to take medication as prescribed.  Pt states when she takes the lyrica  3 times a day she sometimes gets woozy and was asking about maybe taking a dose of her left over gabapentin  to help with pain until her appt on 12/31/23.  Reason for Disposition  [1] MODERATE pain (e.g., interferes with normal activities, limping) AND [2] present > 3 days  Answer Assessment - Initial Assessment Questions 1. ONSET: "When did the pain start?"      Over the weekend 2. LOCATION: "Where is the pain located?"      Bilateral feet and legs 3. PAIN: "How bad is the pain?"    (Scale 1-10; or mild, moderate, severe)  - MILD (1-3): doesn't interfere with normal activities.   - MODERATE (4-7): interferes with normal activities (e.g., work or school) or awakens from sleep, limping.   - SEVERE (8-10): excruciating pain, unable to do any normal activities, unable to walk.      severe 4. WORK OR EXERCISE: "Has there been any recent work or exercise that involved this part of the body?"      no 5. CAUSE: "What do you think is causing the foot pain?"     Neuropathy  6. OTHER SYMPTOMS: "Do you have any other symptoms?" (e.g., leg pain, rash, fever, numbness)     no  Protocols used: Foot Pain-A-AH

## 2023-12-30 ENCOUNTER — Other Ambulatory Visit: Payer: Self-pay | Admitting: Family Medicine

## 2023-12-30 MED ORDER — TRAMADOL HCL 50 MG PO TABS: 25.0000 mg | ORAL_TABLET | Freq: Two times a day (BID) | ORAL | 0 refills | Status: DC | PRN

## 2023-12-30 NOTE — Telephone Encounter (Signed)
 Pt notified of Dr. Ocie Belt comments and agreed with trying Tramadol  until appt., [facility] [address] [facility])

## 2023-12-30 NOTE — Telephone Encounter (Signed)
 Don't recommend adding gabapentin  at this time - would do either lyrica  or gabapentin , not both.  Could add tramadol  for breakthrough pain - tramadol  is a synthetic opioid so wold have to watch for constipation, sedation, habit forming nature of med.

## 2023-12-30 NOTE — Telephone Encounter (Signed)
 ERx

## 2023-12-30 NOTE — Addendum Note (Signed)
 Addended by: Claire Crick on: 12/30/2023 01:41 PM   Modules accepted: Orders

## 2023-12-31 ENCOUNTER — Encounter: Payer: Self-pay | Admitting: Family Medicine

## 2023-12-31 ENCOUNTER — Ambulatory Visit (INDEPENDENT_AMBULATORY_CARE_PROVIDER_SITE_OTHER): Admitting: Family Medicine

## 2023-12-31 VITALS — BP 108/70 | HR 96 | Temp 98.1°F | Ht 70.0 in | Wt 185.4 lb

## 2023-12-31 DIAGNOSIS — M797 Fibromyalgia: Secondary | ICD-10-CM

## 2023-12-31 DIAGNOSIS — M3509 Sicca syndrome with other organ involvement: Secondary | ICD-10-CM

## 2023-12-31 DIAGNOSIS — E063 Autoimmune thyroiditis: Secondary | ICD-10-CM | POA: Diagnosis not present

## 2023-12-31 DIAGNOSIS — G6289 Other specified polyneuropathies: Secondary | ICD-10-CM | POA: Diagnosis not present

## 2023-12-31 MED ORDER — GABAPENTIN 300 MG PO CAPS: ORAL_CAPSULE | ORAL | 1 refills | Status: DC

## 2023-12-31 MED ORDER — PREDNISONE 20 MG PO TABS: ORAL_TABLET | ORAL | 0 refills | Status: DC

## 2023-12-31 NOTE — Patient Instructions (Addendum)
 Labs today.  Let's cross taper off Lyrica  and onto gabapentin . Take Lyrica  100mg  in the morning once daily at same time as you start gabapentin  300mg  at night time. After 1 week, stop Lyrica  and may increase gabapentin  to 300mg  twice daily.  May take prednisone  taper.  Good to see you today.

## 2023-12-31 NOTE — Progress Notes (Unsigned)
 Ph: [phone] Fax: [phone]   Patient ID: [name], female    DOB: 09/17/1960, 63 y.o.   MRN: [id number]  This visit was conducted in person.  BP 108/70   Pulse 96   Temp 98.1 F (36.7 C) (Oral)   Ht 5\' 10"  (1.778 m)   Wt 185 lb 6 oz (84.1 kg)   LMP 06/27/2019   SpO2 98%   BMI 26.60 kg/m    CC: discuss neuropathy Subjective:   HPI: [name] is a 63 y.o. female presenting on 12/31/2023 for Medical Management of Chronic Issues (Wants to discuss trying new neuropathy medication. Current med not helping. )   She tends to be sensitive to medications. Does have upcoming CT scheduled.   Neuropathy present for 10+ yrs, started as side effect to topiramate , but continued despite discontinuing medication.  Acutely worse over the past week - numbness, paresthesias, burning pain to entire bilateral feet, worse at night time, some radiation to mid calf. Fingers also affected but not as severely.  Previous trial low dose gabapentin  not effective - and she was hesitant to increase dose.  Currently on Lyrica  100mg  capsules written as TID - notes this increase in frequency causes loopy, confusion, slowed cognition, brain fog. Lyrica  100mg  bid not effective for pain.   Known FM diagnosed 2005.  Cymbalta  caused apathy, affected emotions, caused weight gain (2023).   ?Sjogren related neuropathy - vs undifferentiated CTD.  Continues plaquenil .  Saw rheum 11/2023 - ANA positive with 1:320 titers (discrete nuclear dot pattern) however anti-rho/la negative H/o B12 def and hypothyroidism - but both have been repleted and normal on latest checks.  SPEP normal 07/2023  ESR normal 11/2023  Lab Results  Component Value Date   VITAMINB12 503 10/08/2022    Lab Results  Component Value Date   TSH 0.41 01/07/2023    Lab Results  Component Value Date   WBC 5.3 12/17/2023   HGB 12.5 12/17/2023   HCT 37.4 12/17/2023   MCV 90.8 12/17/2023   PLT 256 12/17/2023   Lab  Results  Component Value Date   HGBA1C 5.5 11/25/2023   [facility] neurology Dr Candice Chalet Hagen/[facility] 2010s for chronic migraines, last seen 2017.   Family History  Problem Relation Age of Onset   Arthritis/Rheumatoid Mother        RA and OA   Asthma Mother    Breast cancer Maternal Aunt 48   Irregular heart beat Father        need ablation   Basal cell carcinoma Father    Asthma Father        exercise induced asthma    Arthritis Maternal Grandmother    Heart disease Maternal Grandmother    Lung cancer Paternal Grandfather    Drug abuse Brother    Alcohol abuse Brother    Appendicitis Son    Allergies Son    Migraines Son    Eczema Son    Healthy Son    Allergies Daughter    Healthy Daughter    Colon cancer Neg Hx    Pancreatic cancer Neg Hx    Stomach cancer Neg Hx    Esophageal cancer Neg Hx    Rectal cancer Neg Hx        Relevant past medical, surgical, family and social history reviewed and updated as indicated. Interim medical history since our last visit reviewed. Allergies and medications reviewed and updated. Outpatient Medications Prior to Visit  Medication Sig Dispense Refill  ALPRAZolam  (XANAX ) 0.25 MG tablet TAKE 1 TABLET BY MOUTH TWICE A DAY AS NEEDED FOR ANXIETY 30 tablet 0   ARMOUR THYROID  60 MG tablet TAKE 1 TABLET BY MOUTH DAILY BEFORE BREAKFAST 90 tablet 3   Blood Glucose Monitoring Suppl DEVI 1 each by Does not apply route in the morning, at noon, and at bedtime. May substitute to any manufacturer covered by patient's insurance. 1 each 0   CALCIUM-MAGNESIUM-ZINC PO Take by mouth daily.     cetirizine (ZYRTEC) 10 MG tablet Take 10 mg by mouth at bedtime.     Dulaglutide  (TRULICITY ) 1.5 MG/0.5ML SOAJ INJECT 1.5MG  SUBCUTANEOUSLY ONCE WEEKLY 2 mL 6   fluticasone  (FLONASE ) 50 MCG/ACT nasal spray Place into the nose.     hydroxychloroquine  (PLAQUENIL ) 200 MG tablet TAKE 1 TABLET BY MOUTH TWICE A DAY 60 tablet 2   IVERMECTIN EX Apply topically. Gel      Loteprednol Etabonate (EYSUVIS OP) Apply to eye as needed.     methocarbamol  (ROBAXIN ) 500 MG tablet Take 1 tablet (500 mg total) by mouth 2 (two) times daily as needed for muscle spasms. 60 tablet 1   Multiple Vitamins-Minerals (CENTRUM SILVER 50+WOMEN PO) Take 1 tablet by mouth daily.     nystatin  ointment (MYCOSTATIN ) Apply 1 application topically 2 (two) times daily. (Patient taking differently: Apply 1 application  topically as needed.) 30 g 0   OVER THE COUNTER MEDICATION LUNA and GABA supplements     pantoprazole  (PROTONIX ) 40 MG tablet TAKE 1 TABLET TWICE DAILY  BEFORE MEALS. 180 tablet 3   traMADol  (ULTRAM ) 50 MG tablet Take 0.5-1 tablets (25-50 mg total) by mouth 2 (two) times daily as needed for moderate pain (pain score 4-6) (sedation precautions). 20 tablet 0   TURMERIC PO Take by mouth.     pregabalin  (LYRICA ) 100 MG capsule Take 1 capsule (100 mg total) by mouth 3 (three) times daily. (Patient taking differently: Take 100 mg by mouth 2 (two) times daily.) 90 capsule 5   pregabalin  (LYRICA ) 100 MG capsule Take 1 capsule (100 mg total) by mouth daily. For 1 week then stop     meloxicam (MOBIC) 15 MG tablet Take 15 mg by mouth daily.     Perfluorohexyloctane (MIEBO) 1.338 GM/ML SOLN Apply 1 drop to eye 4 (four) times daily as needed (dry eyes). (Patient not taking: Reported on 12/17/2023)     No facility-administered medications prior to visit.     Per HPI unless specifically indicated in ROS section below Review of Systems  Objective:  BP 108/70   Pulse 96   Temp 98.1 F (36.7 C) (Oral)   Ht 5\' 10"  (1.778 m)   Wt 185 lb 6 oz (84.1 kg)   LMP 06/27/2019   SpO2 98%   BMI 26.60 kg/m   Wt Readings from Last 3 Encounters:  12/31/23 185 lb 6 oz (84.1 kg)  12/17/23 185 lb (83.9 kg)  11/25/23 186 lb (84.4 kg)      Physical Exam Vitals and nursing note reviewed.  Constitutional:      Appearance: Normal appearance. She is not ill-appearing.  Neurological:     Mental Status:  She is alert.       Results for orders placed or performed in visit on 12/17/23  CBC with Differential/Platelet   Collection Time: 12/17/23 10:49 AM  Result Value Ref Range   WBC 5.3 3.8 - 10.8 Thousand/uL   RBC 4.12 3.80 - 5.10 Million/uL   Hemoglobin 12.5 11.7 - 15.5 g/dL  HCT 37.4 35.0 - 45.0 %   MCV 90.8 80.0 - 100.0 fL   MCH 30.3 27.0 - 33.0 pg   MCHC 33.4 32.0 - 36.0 g/dL   RDW 96.2 95.2 - 84.1 %   Platelets 256 140 - 400 Thousand/uL   MPV 10.3 7.5 - 12.5 fL   Neutro Abs 2,231 1,500 - 7,800 cells/uL   Absolute Lymphocytes 2,003 850 - 3,900 cells/uL   Absolute Monocytes 535 200 - 950 cells/uL   Eosinophils Absolute 461 15 - 500 cells/uL   Basophils Absolute 69 0 - 200 cells/uL   Neutrophils Relative % 42.1 %   Total Lymphocyte 37.8 %   Monocytes Relative 10.1 %   Eosinophils Relative 8.7 %   Basophils Relative 1.3 %  Urinalysis, Routine w reflex microscopic   Collection Time: 12/17/23 10:49 AM  Result Value Ref Range   Color, Urine YELLOW YELLOW   APPearance CLEAR CLEAR   Specific Gravity, Urine 1.013 1.001 - 1.035   pH 5.5 5.0 - 8.0   Glucose, UA NEGATIVE NEGATIVE   Bilirubin Urine NEGATIVE NEGATIVE   Ketones, ur NEGATIVE NEGATIVE   Hgb urine dipstick NEGATIVE NEGATIVE   Protein, ur NEGATIVE NEGATIVE   Nitrite NEGATIVE NEGATIVE   Leukocytes,Ua NEGATIVE NEGATIVE  Comprehensive metabolic panel with GFR   Collection Time: 12/17/23 10:49 AM  Result Value Ref Range   Glucose, Bld 80 65 - 99 mg/dL   BUN 27 (H) 7 - 25 mg/dL   Creat 3.24 (H) 4.01 - 1.05 mg/dL   eGFR 54 (L) > OR = 60 mL/min/1.95m2   BUN/Creatinine Ratio 23 (H) 6 - 22 (calc)   Sodium 139 135 - 146 mmol/L   Potassium 5.0 3.5 - 5.3 mmol/L   Chloride 107 98 - 110 mmol/L   CO2 26 20 - 32 mmol/L   Calcium 9.8 8.6 - 10.4 mg/dL   Total Protein 7.0 6.1 - 8.1 g/dL   Albumin 4.2 3.6 - 5.1 g/dL   Globulin 2.8 1.9 - 3.7 g/dL (calc)   AG Ratio 1.5 1.0 - 2.5 (calc)   Total Bilirubin 0.4 0.2 - 1.2 mg/dL    Alkaline phosphatase (APISO) 63 37 - 153 U/L   AST 24 10 - 35 U/L   ALT 18 6 - 29 U/L  C3 and C4   Collection Time: 12/17/23 10:49 AM  Result Value Ref Range   C3 Complement 126 83 - 193 mg/dL   C4 Complement 20 15 - 57 mg/dL  Sedimentation rate   Collection Time: 12/17/23 10:49 AM  Result Value Ref Range   Sed Rate 2 0 - 30 mm/h  ANA   Collection Time: 12/17/23 10:49 AM  Result Value Ref Range   Anti Nuclear Antibody (ANA) POSITIVE (A) NEGATIVE  Sjogrens syndrome-A extractable nuclear antibody   Collection Time: 12/17/23 10:49 AM  Result Value Ref Range   SSA (Ro) (ENA) Antibody, IgG <1.0 NEG <1.0 NEG AI  Anti-nuclear ab-titer (ANA titer)   Collection Time: 12/17/23 10:49 AM  Result Value Ref Range   ANA Titer 1 1:320 (H) titer   ANA Pattern 1 Nuclear, Discrete Nuclear Dots (A)     Assessment & Plan:   Problem List Items Addressed This Visit     Hypothyroidism due to Hashimoto's thyroiditis   Update thyroid  levels.       Peripheral neuropathy - Primary   Chronic ?CTD related vs other. Describes length dependent sensory neuropathy.  Update labs today.  Lyrica  TID was too sedation, affecting  cognition, mental fog. Lower dose was ineffective. She desires to cross taper onto Gabapentin  which is reasonable.  Reviewed taper - Lyrica  100mg  in am x1 wk and gabapentin  300mg  at bedtime x1 week then stop Lyrica  and increase gabapentin  to 300mg  BID. She has 100mg  dose as well - and may titrate more slowly las needed.  Reviewed side effects of gabapentin  to monitor for.  Given severity of recent symptoms, she requests short prednisone  taper.  If doesn't do well with this, she would consider retrial of Cymbalta .       Relevant Medications   gabapentin  (NEURONTIN ) 300 MG capsule   pregabalin  (LYRICA ) 100 MG capsule   Other Relevant Orders   TSH   T4, free   Vitamin B12   Renal function panel   Vitamin B1   Vitamin B6   Fibromyalgia   See above re cross taper from Lyrica  to  gabapentin .        Relevant Medications   gabapentin  (NEURONTIN ) 300 MG capsule   pregabalin  (LYRICA ) 100 MG capsule   predniSONE  (DELTASONE ) 20 MG tablet   Sjogren's disease (HCC)   Followed by rheum.  Continuing plaquenil  at this time.  ?of CTD vs Sjogren's diagnosis.         Meds ordered this encounter  Medications   gabapentin  (NEURONTIN ) 300 MG capsule    Sig: Take 1 capsule (300 mg total) by mouth at bedtime for 7 days, THEN 1 capsule (300 mg total) 2 (two) times daily.    Dispense:  67 capsule    Refill:  1   predniSONE  (DELTASONE ) 20 MG tablet    Sig: Take two tablets daily for 3 days followed by one tablet daily for 4 days    Dispense:  10 tablet    Refill:  0    Orders Placed This Encounter  Procedures   TSH   T4, free   Vitamin B12   Renal function panel   Vitamin B1   Vitamin B6    Patient Instructions  Labs today.  Let's cross taper off Lyrica  and onto gabapentin . Take Lyrica  100mg  in the morning once daily at same time as you start gabapentin  300mg  at night time. After 1 week, stop Lyrica  and may increase gabapentin  to 300mg  twice daily.  May take prednisone  taper.  Good to see you today.   Follow up plan: Return if symptoms worsen or fail to improve.  Claire Crick, MD

## 2023-12-31 NOTE — Assessment & Plan Note (Signed)
Update thyroid levels.

## 2024-01-01 LAB — RENAL FUNCTION PANEL
Albumin: 4.2 g/dL (ref 3.5–5.2)
BUN: 36 mg/dL — ABNORMAL HIGH (ref 6–23)
CO2: 25 meq/L (ref 19–32)
Calcium: 9.3 mg/dL (ref 8.4–10.5)
Chloride: 106 meq/L (ref 96–112)
Creatinine, Ser: 1.37 mg/dL — ABNORMAL HIGH (ref 0.40–1.20)
GFR: 41.81 mL/min — ABNORMAL LOW (ref >60.00)
Glucose, Bld: 81 mg/dL (ref 70–99)
Phosphorus: 3.5 mg/dL (ref 2.3–4.6)
Potassium: 3.9 meq/L (ref 3.5–5.1)
Sodium: 139 meq/L (ref 135–145)

## 2024-01-01 LAB — T4, FREE: Free T4: 0.61 ng/dL (ref 0.60–1.60)

## 2024-01-01 LAB — TSH: TSH: 1.02 u[IU]/mL (ref 0.35–5.50)

## 2024-01-01 LAB — VITAMIN B12: Vitamin B-12: 819 pg/mL (ref 211–911)

## 2024-01-01 NOTE — Assessment & Plan Note (Signed)
 Followed by rheum.  Continuing plaquenil  at this time.  ?of CTD vs Sjogren's diagnosis.

## 2024-01-01 NOTE — Assessment & Plan Note (Signed)
 See above re cross taper from Lyrica  to gabapentin .

## 2024-01-01 NOTE — Assessment & Plan Note (Addendum)
 Chronic ?CTD related vs other. Describes length dependent sensory neuropathy.  Update labs today.  Lyrica  TID was too sedation, affecting cognition, mental fog. Lower dose was ineffective. She desires to cross taper onto Gabapentin  which is reasonable.  Reviewed taper - Lyrica  100mg  in am x1 wk and gabapentin  300mg  at bedtime x1 week then stop Lyrica  and increase gabapentin  to 300mg  BID. She has 100mg  dose as well - and may titrate more slowly las needed.  Reviewed side effects of gabapentin  to monitor for.  Given severity of recent symptoms, she requests short prednisone  taper.  If doesn't do well with this, she would consider retrial of Cymbalta .

## 2024-01-04 LAB — VITAMIN B1: Vitamin B1 (Thiamine): 26 nmol/L (ref 8–30)

## 2024-01-04 LAB — VITAMIN B6: Vitamin B6: 83.7 ng/mL — ABNORMAL HIGH (ref 2.1–21.7)

## 2024-01-04 NOTE — Telephone Encounter (Signed)
 Likely related to meloxicam use but she can discontinue plaquenil  if she would like to.

## 2024-01-05 ENCOUNTER — Encounter: Payer: Self-pay | Admitting: Family Medicine

## 2024-01-05 DIAGNOSIS — D0439 Carcinoma in situ of skin of other parts of face: Secondary | ICD-10-CM | POA: Diagnosis not present

## 2024-01-05 NOTE — Telephone Encounter (Signed)
 Plaquenil  does not typically affect the renal function but if someone has a low GFR we often will reduce the dose of plaquenil .  She can reduce the dose of plaquenil  to once daily and recheck BMP with GFR in 2-3 weeks

## 2024-01-06 ENCOUNTER — Telehealth: Payer: Self-pay

## 2024-01-06 NOTE — Telephone Encounter (Signed)
 Pls address in Dr Ocie Belt absence. (Also, see 01/05/24 pt message.)

## 2024-01-06 NOTE — Telephone Encounter (Signed)
 Plz address pt's concerns in Dr Ocie Belt absence.

## 2024-01-06 NOTE — Telephone Encounter (Signed)
 Closing encounter sent patient a mychart message.

## 2024-01-06 NOTE — Telephone Encounter (Signed)
 Copied from CRM [id number]. Topic: Clinical - Medical Advice >> Jan 06, 2024 11:08 AM Bambi Bonine D wrote: Reason for CRM: Pt stated that she has been having some issues with her renal function and it has been declining since December. Pt would like to know if it could be the Trulicity  or the Protonix . Pt stated that she sent a message on MyChart yesterday and received a response that Dr.Gutierrez will be out of office until 5/20. Pt wants to know if any of the nurses or other providers could give her a call back today regarding the concerns.

## 2024-01-06 NOTE — Telephone Encounter (Signed)
 I will look at it but I need time to review her case in the meantime.  I will respond to the mychart message when possible, hopefully tomorrow.  Thanks.

## 2024-01-07 ENCOUNTER — Other Ambulatory Visit: Payer: Self-pay | Admitting: Family Medicine

## 2024-01-07 DIAGNOSIS — R7989 Other specified abnormal findings of blood chemistry: Secondary | ICD-10-CM

## 2024-01-07 HISTORY — PX: SKIN CANCER EXCISION: SHX779

## 2024-01-08 ENCOUNTER — Ambulatory Visit: Payer: Self-pay | Admitting: Family Medicine

## 2024-01-08 DIAGNOSIS — N289 Disorder of kidney and ureter, unspecified: Secondary | ICD-10-CM

## 2024-01-09 ENCOUNTER — Inpatient Hospital Stay: Admission: RE | Admit: 2024-01-09 | Source: Ambulatory Visit

## 2024-01-21 ENCOUNTER — Other Ambulatory Visit: Payer: Self-pay | Admitting: Family Medicine

## 2024-01-21 ENCOUNTER — Encounter: Payer: Self-pay | Admitting: Family Medicine

## 2024-01-21 ENCOUNTER — Ambulatory Visit: Payer: Self-pay | Admitting: Family Medicine

## 2024-01-21 ENCOUNTER — Other Ambulatory Visit (INDEPENDENT_AMBULATORY_CARE_PROVIDER_SITE_OTHER)

## 2024-01-21 DIAGNOSIS — K219 Gastro-esophageal reflux disease without esophagitis: Secondary | ICD-10-CM

## 2024-01-21 DIAGNOSIS — N289 Disorder of kidney and ureter, unspecified: Secondary | ICD-10-CM | POA: Diagnosis not present

## 2024-01-21 DIAGNOSIS — R7989 Other specified abnormal findings of blood chemistry: Secondary | ICD-10-CM | POA: Diagnosis not present

## 2024-01-21 DIAGNOSIS — F325 Major depressive disorder, single episode, in full remission: Secondary | ICD-10-CM

## 2024-01-21 LAB — MICROALBUMIN / CREATININE URINE RATIO
Creatinine,U: 44.9 mg/dL
Microalb Creat Ratio: UNDETERMINED mg/g (ref 0.0–30.0)
Microalb, Ur: <0.7 mg/dL

## 2024-01-21 LAB — BASIC METABOLIC PANEL WITH GFR
BUN: 18 mg/dL (ref 6–23)
CO2: 28 meq/L (ref 19–32)
Calcium: 9.6 mg/dL (ref 8.4–10.5)
Chloride: 104 meq/L (ref 96–112)
Creatinine, Ser: 1.09 mg/dL (ref 0.40–1.20)
GFR: 54.99 mL/min — ABNORMAL LOW (ref >60.00)
Glucose, Bld: 76 mg/dL (ref 70–99)
Potassium: 4.1 meq/L (ref 3.5–5.1)
Sodium: 139 meq/L (ref 135–145)

## 2024-01-21 NOTE — Telephone Encounter (Signed)
 Name of Medication:  Alprazolam  Name of Pharmacy:  [facility] Last Fill or Written Date and Quantity:  12/24/23, #30 Last Office Visit and Type:  12/31/23, neuropathy f/u Next Office Visit and Type:  none Last Controlled Substance Agreement Date:  06/28/14 Last UDS:  06/28/14

## 2024-01-22 ENCOUNTER — Ambulatory Visit
Admission: RE | Admit: 2024-01-22 | Discharge: 2024-01-22 | Disposition: A | Discharge status: Home / Self Care | Source: Ambulatory Visit | Attending: Family Medicine | Admitting: Family Medicine

## 2024-01-22 DIAGNOSIS — R918 Other nonspecific abnormal finding of lung field: Secondary | ICD-10-CM

## 2024-01-22 LAB — PARATHYROID HORMONE, INTACT (NO CA): PTH: 20 pg/mL (ref 16–77)

## 2024-01-23 ENCOUNTER — Ambulatory Visit: Payer: Self-pay | Admitting: Family Medicine

## 2024-01-23 NOTE — Telephone Encounter (Signed)
 ERx

## 2024-01-26 ENCOUNTER — Other Ambulatory Visit

## 2024-01-27 ENCOUNTER — Other Ambulatory Visit: Payer: Self-pay | Admitting: Physician Assistant

## 2024-01-27 DIAGNOSIS — F329 Major depressive disorder, single episode, unspecified: Secondary | ICD-10-CM | POA: Diagnosis not present

## 2024-01-27 NOTE — Telephone Encounter (Signed)
 Last Fill: 11/03/2023  Eye exam: 09/30/2023 WNL    Labs: 12/17/2023 CBC WNL  Creatinine is elevated-1.15 and GFR is low-54.  Rest of CMP WNL.   Next Visit: 02/26/2024  Last Visit: 12/17/2023  [id number] syndrome with other organ involvement   Current Dose per office note 12/17/2023: Plaquenil  200 mg 1 tablet by mouth twice daily.   Okay to refill Plaquenil ?

## 2024-02-03 ENCOUNTER — Ambulatory Visit: Payer: Self-pay | Admitting: Family Medicine

## 2024-02-03 DIAGNOSIS — M1711 Unilateral primary osteoarthritis, right knee: Secondary | ICD-10-CM | POA: Diagnosis not present

## 2024-02-03 DIAGNOSIS — R911 Solitary pulmonary nodule: Secondary | ICD-10-CM

## 2024-02-05 DIAGNOSIS — M1712 Unilateral primary osteoarthritis, left knee: Secondary | ICD-10-CM | POA: Diagnosis not present

## 2024-02-06 ENCOUNTER — Other Ambulatory Visit

## 2024-02-10 DIAGNOSIS — F329 Major depressive disorder, single episode, unspecified: Secondary | ICD-10-CM | POA: Diagnosis not present

## 2024-02-12 ENCOUNTER — Telehealth: Payer: Self-pay

## 2024-02-12 ENCOUNTER — Ambulatory Visit: Payer: Self-pay

## 2024-02-12 NOTE — Telephone Encounter (Signed)
 Duplicate encounter due to [facility]. See recent nurse triage encounter.                Copied from CRM [id number]. Topic: Clinical - Red Word Triage >> Feb 12, 2024 12:32 PM Justina Oman C wrote: Red Word that prompted transfer to Nurse Triage: Patient [phone] wants to schedule an appointment with Dr. Marygrace Snellen, last seen 03/21/22. Patient's states pcp Dr. Tarry Farmer ordered CT chest scan 01/22/24 for possible bronchitis and shortness of breath. Patient googled Pots treatment and would like to discuss with Dr. Marygrace Snellen. Patient states shortness of breath, hard time to breath and talk, lightheaded/dizziness, hyperventilating to breath when outside and activities, chest pain more like tightness off and on. Patient feels like symptoms worsen from last seeing Dr. Diania Fortes but it's not acute. Patient has Sjorgen's disease. Patient states radiologist reviewed Chest xray in 10/14/23 coarsened interstitial markings and follow up with pulmonologist. Please advise.   ----------------------------------------------------------------------- From previous Reason for Contact - Scheduling: Patient/patient representative is calling to schedule an appointment. Refer to attachments for appointment information.

## 2024-02-12 NOTE — Telephone Encounter (Signed)
 FYI Only or Action Required?: FYI only for provider.  Patient was last seen in primary care on 12/31/2023 by Claire Crick, MD. Called Nurse Triage reporting Shortness of Breath. Symptoms began several weeks ago. Interventions attempted: Nothing. Symptoms are: unchanged.  Triage Disposition: Information Only   Patient/caregiver understands and will follow disposition?: Yes, with Modifications.   Pt. Declined triage, but stated she has had all of the tests ran, they were normal and wants to follow-up with pulmonary provider to discuss why symptoms are unchanged. She reports working outside more, but her symptoms of SOB are gradually worsening.   Appointment made for 7/16 with Pulmonary.

## 2024-02-18 DIAGNOSIS — C4441 Basal cell carcinoma of skin of scalp and neck: Secondary | ICD-10-CM | POA: Diagnosis not present

## 2024-02-18 DIAGNOSIS — D0439 Carcinoma in situ of skin of other parts of face: Secondary | ICD-10-CM | POA: Diagnosis not present

## 2024-02-18 DIAGNOSIS — Z48817 Encounter for surgical aftercare following surgery on the skin and subcutaneous tissue: Secondary | ICD-10-CM | POA: Diagnosis not present

## 2024-02-24 DIAGNOSIS — M7062 Trochanteric bursitis, left hip: Secondary | ICD-10-CM | POA: Diagnosis not present

## 2024-02-24 NOTE — Telephone Encounter (Signed)
 Med list updated

## 2024-02-25 ENCOUNTER — Ambulatory Visit: Payer: Self-pay | Admitting: Family Medicine

## 2024-02-25 ENCOUNTER — Encounter: Payer: Self-pay | Admitting: Family Medicine

## 2024-02-25 VITALS — BP 118/80 | HR 106 | Temp 98.5°F | Ht 70.0 in | Wt 178.4 lb

## 2024-02-25 DIAGNOSIS — F411 Generalized anxiety disorder: Secondary | ICD-10-CM | POA: Diagnosis not present

## 2024-02-25 DIAGNOSIS — N289 Disorder of kidney and ureter, unspecified: Secondary | ICD-10-CM

## 2024-02-25 DIAGNOSIS — Z7985 Long-term (current) use of injectable non-insulin antidiabetic drugs: Secondary | ICD-10-CM | POA: Diagnosis not present

## 2024-02-25 DIAGNOSIS — R911 Solitary pulmonary nodule: Secondary | ICD-10-CM

## 2024-02-25 DIAGNOSIS — R0609 Other forms of dyspnea: Secondary | ICD-10-CM | POA: Diagnosis not present

## 2024-02-25 DIAGNOSIS — G4700 Insomnia, unspecified: Secondary | ICD-10-CM | POA: Diagnosis not present

## 2024-02-25 DIAGNOSIS — F325 Major depressive disorder, single episode, in full remission: Secondary | ICD-10-CM

## 2024-02-25 DIAGNOSIS — E1169 Type 2 diabetes mellitus with other specified complication: Secondary | ICD-10-CM | POA: Diagnosis not present

## 2024-02-25 DIAGNOSIS — E672 Megavitamin-B6 syndrome: Secondary | ICD-10-CM

## 2024-02-25 DIAGNOSIS — M797 Fibromyalgia: Secondary | ICD-10-CM

## 2024-02-25 DIAGNOSIS — C44321 Squamous cell carcinoma of skin of nose: Secondary | ICD-10-CM | POA: Diagnosis not present

## 2024-02-25 DIAGNOSIS — M3509 Sicca syndrome with other organ involvement: Secondary | ICD-10-CM

## 2024-02-25 DIAGNOSIS — R5381 Other malaise: Secondary | ICD-10-CM

## 2024-02-25 MED ORDER — COENZYME Q10 100 MG PO CAPS: 100.0000 mg | ORAL_CAPSULE | Freq: Every day | ORAL | Status: AC

## 2024-02-25 MED ORDER — GABAPENTIN 300 MG PO CAPS: 300.0000 mg | ORAL_CAPSULE | Freq: Two times a day (BID) | ORAL | 1 refills | Status: DC

## 2024-02-25 MED ORDER — FAMOTIDINE 20 MG PO TABS: 20.0000 mg | ORAL_TABLET | Freq: Every day | ORAL | Status: DC | PRN

## 2024-02-25 MED ORDER — ALPRAZOLAM 0.25 MG PO TABS: 0.2500 mg | ORAL_TABLET | Freq: Two times a day (BID) | ORAL | 0 refills | Status: DC | PRN

## 2024-02-25 NOTE — Patient Instructions (Addendum)
 Keep lung doctor appointment.  Ok to increase alprazolam  to #50/month.  Look into rheumatology second opinion.  Try pepcid as needed for heartburn symptoms.  Stay off pantoprazole  for now.  Return in 2 months for physical, 1 wk prior fasting for labs.

## 2024-02-25 NOTE — Progress Notes (Unsigned)
 Ph: [phone] Fax: [phone]   Patient ID: [name], female    DOB: [birth year], 63 y.o.   MRN: [id number]  This visit was conducted in person.  BP 118/80   Pulse (!) 106   Temp 98.5 F (36.9 C) (Oral)   Ht 5' 10 (1.778 m)   Wt 178 lb 6 oz (80.9 kg)   LMP 06/27/2019   SpO2 97%   BMI 25.59 kg/m   BP Readings from Last 3 Encounters:  02/25/24 118/80  12/31/23 108/70  12/17/23 100/62   CC: discuss mood  Subjective:   HPI: [name] is a 63 y.o. female presenting on 02/25/2024 for Medical Management of Chronic Issues (Here for mood f/u- wants to discuss alprazolam . Also, wants to discuss CT. )   Recent squamous cell cancer to right nose - s/p 3 rounds of Moh's - without clear margins - decided to treat with 5-FU topical treatment.   Anxiety > depression managed with xanax  0.25mg  BID PRN - currently taking 1/2 tablet at bedtime and another 1/2 if she awakens at night (normally does).  She has been seeing therapist Vernell Chester for the past 1.5 yrs.  Lexapro  intolerance - dizziness.  Cymbalta  caused weight gain - desires to stay off daily treatment at this time.   Renal insufficiency - she stopped pantoprazole . She stopped multivitamin. She just stopped mega-Q plus supplement which had a lot of B6 and B12.   Reviewed chest CT 12/2023 obtained to follow h/o pulm nodules - overall reassuring and stable nodules compared to prior high resolution CT 02/2022 (at that time no signs of fibrotic ILD but did suggest small airways disease).  Upcoming pulmonology appt f/u scheduled 03/10/2024.  PFTs normal 02/2022, no bronchodilator response.   Notes worsening exertional dyspnea, especially noted with heat or prolonged standing ie working in the yard. Unable to work for more than 10-15 min at a time in the yard - 5 years ago she was able to work for 6 hours at a time. Compression stockings haven't helped.   She notes she had COVID 06/2019. Symptoms seem to have  worsened since then.  Notes fmhx POTS and syncope.     02/25/2024   11:07 AM 12/31/2023    2:00 PM 11/25/2023   12:17 PM 10/14/2023    2:07 PM 08/26/2023    9:32 AM  Depression screen PHQ 2/9  Decreased Interest 3 1 1 1 1   Down, Depressed, Hopeless 2 1 1 1 1   PHQ - 2 Score 5 2 2 2 2   Altered sleeping 2 0 1 1 0  Tired, decreased energy 3 3 1  0 3  Change in appetite 2 0 1 0 0  Feeling bad or failure about yourself  2 1 1 1 1   Trouble concentrating 1 2 1  0 0  Moving slowly or fidgety/restless 0 2 1 1 1   Suicidal thoughts 0 0 0 0 0  PHQ-9 Score 15 10 8 5 7   Difficult doing work/chores Very difficult Somewhat difficult Somewhat difficult  Somewhat difficult       02/25/2024   11:07 AM 12/31/2023    2:01 PM 11/25/2023   12:18 PM 10/14/2023    2:08 PM  GAD 7 : Generalized Anxiety Score  Nervous, Anxious, on Edge 3 1 2 2   Control/stop worrying 2 1 1 2   Worry too much - different things 1 2 1 2   Trouble relaxing 1 1 1 2   Restless 0 0 0 1  Easily annoyed or irritable 1 1 1 2   Afraid - awful might happen 3 2 2 2   Total GAD 7 Score 11 8 8 13   Anxiety Difficulty Very difficult Somewhat difficult Somewhat difficult Somewhat difficult       Relevant past medical, surgical, family and social history reviewed and updated as indicated. Interim medical history since our last visit reviewed. Allergies and medications reviewed and updated. Outpatient Medications Prior to Visit  Medication Sig Dispense Refill   ARMOUR THYROID  60 MG tablet TAKE 1 TABLET BY MOUTH DAILY BEFORE BREAKFAST 90 tablet 3   Blood Glucose Monitoring Suppl DEVI 1 each by Does not apply route in the morning, at noon, and at bedtime. May substitute to any manufacturer covered by patient's insurance. 1 each 0   CALCIUM-MAGNESIUM-ZINC PO Take by mouth daily.     cetirizine (ZYRTEC) 10 MG tablet Take 10 mg by mouth at bedtime.     cholecalciferol (VITAMIN D3) 25 MCG (1000 UNIT) tablet Take 1,000 Units by mouth daily.     Dulaglutide   (TRULICITY ) 1.5 MG/0.5ML SOAJ INJECT 1.5MG  SUBCUTANEOUSLY ONCE WEEKLY 2 mL 6   fluorouracil (EFUDEX) 5 % cream Apply topically 2 (two) times daily.     fluticasone  (FLONASE ) 50 MCG/ACT nasal spray Place into the nose.     hydroxychloroquine  (PLAQUENIL ) 200 MG tablet TAKE 1 TABLET BY MOUTH TWICE A DAY 180 tablet 0   IVERMECTIN EX Apply topically. Gel     Loteprednol Etabonate (EYSUVIS OP) Apply to eye as needed.     methocarbamol  (ROBAXIN ) 500 MG tablet Take 1 tablet (500 mg total) by mouth 2 (two) times daily as needed for muscle spasms. 60 tablet 1   nystatin  ointment (MYCOSTATIN ) Apply 1 application topically 2 (two) times daily. (Patient taking differently: Apply 1 application  topically as needed.) 30 g 0   OVER THE COUNTER MEDICATION LUNA and GABA supplements     traMADol  (ULTRAM ) 50 MG tablet Take 0.5-1 tablets (25-50 mg total) by mouth 2 (two) times daily as needed for moderate pain (pain score 4-6) (sedation precautions). 20 tablet 0   TURMERIC PO Take by mouth.     ALPRAZolam  (XANAX ) 0.25 MG tablet TAKE 1 TABLET BY MOUTH TWICE A DAY AS NEEDED FOR ANXIETY 30 tablet 0   gabapentin  (NEURONTIN ) 300 MG capsule Take 1 capsule (300 mg total) by mouth at bedtime for 7 days, THEN 1 capsule (300 mg total) 2 (two) times daily. 67 capsule 1   pantoprazole  (PROTONIX ) 40 MG tablet Take 1 tablet (40 mg total) by mouth daily as needed.     No facility-administered medications prior to visit.     Per HPI unless specifically indicated in ROS section below Review of Systems  Objective:  BP 118/80   Pulse (!) 106   Temp 98.5 F (36.9 C) (Oral)   Ht 5' 10 (1.778 m)   Wt 178 lb 6 oz (80.9 kg)   LMP 06/27/2019   SpO2 97%   BMI 25.59 kg/m   Wt Readings from Last 3 Encounters:  02/25/24 178 lb 6 oz (80.9 kg)  12/31/23 185 lb 6 oz (84.1 kg)  12/17/23 185 lb (83.9 kg)      Physical Exam Vitals and nursing note reviewed.  Constitutional:      Appearance: Normal appearance. She is not  ill-appearing.  HENT:     Head: Normocephalic and atraumatic.     Mouth/Throat:     Mouth: Mucous membranes are moist.     Pharynx:  Oropharynx is clear. No oropharyngeal exudate or posterior oropharyngeal erythema.  Eyes:     Extraocular Movements: Extraocular movements intact.     Conjunctiva/sclera: Conjunctivae normal.     Pupils: Pupils are equal, round, and reactive to light.  Neck:     Thyroid : No thyroid  mass or thyromegaly.  Cardiovascular:     Rate and Rhythm: Regular rhythm. Tachycardia present.     Pulses: Normal pulses.     Heart sounds: Normal heart sounds. No murmur heard. Pulmonary:     Effort: Pulmonary effort is normal. No respiratory distress.     Breath sounds: Normal breath sounds. No wheezing, rhonchi or rales.  Musculoskeletal:     Cervical back: Normal range of motion and neck supple.     Right lower leg: No edema.     Left lower leg: No edema.  Skin:    General: Skin is warm and dry.     Findings: No rash.  Neurological:     Mental Status: She is alert.  Psychiatric:        Mood and Affect: Mood normal.        Behavior: Behavior normal.       Results for orders placed or performed in visit on 01/21/24  Parathyroid  hormone, intact (no Ca)   Collection Time: 01/21/24 10:12 AM  Result Value Ref Range   PTH 20 16 - 77 pg/mL  Basic metabolic panel with GFR   Collection Time: 01/21/24 10:12 AM  Result Value Ref Range   Sodium 139 135 - 145 mEq/L   Potassium 4.1 3.5 - 5.1 mEq/L   Chloride 104 96 - 112 mEq/L   CO2 28 19 - 32 mEq/L   Glucose, Bld 76 70 - 99 mg/dL   BUN 18 6 - 23 mg/dL   Creatinine, Ser 8.90 0.40 - 1.20 mg/dL   GFR 45.00 (L) >39.99 mL/min   Calcium 9.6 8.4 - 10.5 mg/dL  Microalbumin / creatinine urine ratio   Collection Time: 01/21/24 10:12 AM  Result Value Ref Range   Microalb, Ur <0.7 mg/dL   Creatinine,U 55.0 mg/dL   Microalb Creat Ratio Unable to calculate 0.0 - 30.0 mg/g   Lab Results  Component Value Date   TSH 1.02  12/31/2023   Lab Results  Component Value Date   VITAMINB12 819 12/31/2023    Lab Results  Component Value Date   VD25OH 48.07 07/15/2022  Vitamin B6 83.7 (H) (12/31/2023)  Assessment & Plan:   Problem List Items Addressed This Visit     MDD (major depressive disorder), single episode, in full remission (HCC)   Ongoing difficulty worsened by general malaise.  Declines daily medication.  Will increase xanax  as per below.  Continue counseling       Relevant Medications   ALPRAZolam  (XANAX ) 0.25 MG tablet   Insomnia   On xanax  nightly for sleep - consider trial of different sleep medicine      Generalized anxiety disorder   Worsening control. Currently xanax  0.25mg  prescribed 1 bid prn. She's taking 1/2 tab at night with 2nd 1/2 a few hours later as needed (pretty routinely). Discussed reasonable to try higher # /month hopefully temporarily - monitor with effect.  Not on daily anxiety medication - consider prozac, pristiq vs milnacipran or levomilnacipran vs bupropion  - as least likely to cause weight gain.       Relevant Medications   ALPRAZolam  (XANAX ) 0.25 MG tablet   Fibromyalgia   Continue gabapentin  300mg  bid.  Relevant Medications   gabapentin  (NEURONTIN ) 300 MG capsule   Type 2 diabetes mellitus with other specified complication (HCC)   Chronic, stable period on trulicity  1.5mg  weekly - continue       Sjogren's disease (HCC)   Sees rheum Dr Jannice Danie Craze PA. Has discussed 2nd opinion with them, considering options.       Dyspnea on exertion - Primary   Chronic progressive.  Initial workup including high res CT, PFTs (no bronchodilator response), cardiopulmonary test all unrevealing.  ?sjogren related  Encouraged pulm f/u.       Malaise and fatigue   Pulmonary nodule, right   Reassuringly stable on latest CT scan.       Squamous cell cancer of skin of nose   Appreciate dermatology care s/p Mohs, now treating residual with 5FU cream.        Relevant Medications   ALPRAZolam  (XANAX ) 0.25 MG tablet   Renal insufficiency   Kidney function actually improving over last few readings.  She is now off regular PPI. Discussed pepcid/tums use.  Update renal panel when she returns for CPE.       Hypervitaminosis B6   She found MVI and mega-Q plus supplement both had high B6 levels - now off this. Will recheck levels next labwork.         Meds ordered this encounter  Medications   gabapentin  (NEURONTIN ) 300 MG capsule    Sig: Take 1 capsule (300 mg total) by mouth 2 (two) times daily.    Dispense:  180 capsule    Refill:  1   ALPRAZolam  (XANAX ) 0.25 MG tablet    Sig: Take 1 tablet (0.25 mg total) by mouth 2 (two) times daily as needed for anxiety.    Dispense:  50 tablet    Refill:  0    Not to exceed 4 additional fills before 06/21/2024   Coenzyme Q10 100 MG capsule    Sig: Take 1 capsule (100 mg total) by mouth daily.   famotidine (PEPCID) 20 MG tablet    Sig: Take 1 tablet (20 mg total) by mouth daily as needed for heartburn or indigestion.    No orders of the defined types were placed in this encounter.   Patient Instructions  Keep lung doctor appointment.  Ok to increase alprazolam  to #50/month.  Look into rheumatology second opinion.  Try pepcid as needed for heartburn symptoms.  Stay off pantoprazole  for now.  Return in 2 months for physical, 1 wk prior fasting for labs.   Follow up plan: Return in about 2 months (around 04/27/2024), or if symptoms worsen or fail to improve, for annual exam, prior fasting for blood work.  Anton Blas, MD

## 2024-02-26 ENCOUNTER — Encounter: Payer: Self-pay | Admitting: Family Medicine

## 2024-02-26 DIAGNOSIS — N289 Disorder of kidney and ureter, unspecified: Secondary | ICD-10-CM | POA: Insufficient documentation

## 2024-02-26 DIAGNOSIS — C44321 Squamous cell carcinoma of skin of nose: Secondary | ICD-10-CM | POA: Insufficient documentation

## 2024-02-26 DIAGNOSIS — E672 Megavitamin-B6 syndrome: Secondary | ICD-10-CM | POA: Insufficient documentation

## 2024-02-26 NOTE — Assessment & Plan Note (Addendum)
 Chronic progressive.  Initial workup including high res CT, PFTs (no bronchodilator response), cardiopulmonary test all unrevealing.  ?sjogren related  Encouraged pulm f/u.

## 2024-02-26 NOTE — Assessment & Plan Note (Signed)
 Sees rheum Dr Jannice Danie Craze PA. Has discussed 2nd opinion with them, considering options.

## 2024-02-26 NOTE — Assessment & Plan Note (Signed)
 Reassuringly stable on latest CT scan.

## 2024-02-26 NOTE — Assessment & Plan Note (Signed)
 She found MVI and mega-Q plus supplement both had high B6 levels - now off this. Will recheck levels next labwork.

## 2024-02-26 NOTE — Assessment & Plan Note (Signed)
 Kidney function actually improving over last few readings.  She is now off regular PPI. Discussed pepcid/tums use.  Update renal panel when she returns for CPE.

## 2024-02-26 NOTE — Assessment & Plan Note (Signed)
 On xanax  nightly for sleep - consider trial of different sleep medicine

## 2024-02-26 NOTE — Assessment & Plan Note (Addendum)
 Ongoing difficulty worsened by general malaise.  Declines daily medication.  Will increase xanax  as per below.  Continue counseling

## 2024-02-26 NOTE — Assessment & Plan Note (Addendum)
 Worsening control. Currently xanax  0.25mg  prescribed 1 bid prn. She's taking 1/2 tab at night with 2nd 1/2 a few hours later as needed (pretty routinely). Discussed reasonable to try higher # /month hopefully temporarily - monitor with effect.  Not on daily anxiety medication - consider prozac, pristiq vs milnacipran or levomilnacipran vs bupropion  - as least likely to cause weight gain.

## 2024-02-26 NOTE — Assessment & Plan Note (Signed)
 Appreciate dermatology care s/p Mohs, now treating residual with 5FU cream.

## 2024-02-26 NOTE — Assessment & Plan Note (Addendum)
Continue gabapentin 300 mg bid. 

## 2024-02-26 NOTE — Assessment & Plan Note (Signed)
 Chronic, stable period on trulicity  1.5mg  weekly - continue

## 2024-02-27 ENCOUNTER — Other Ambulatory Visit: Payer: Self-pay | Admitting: Family Medicine

## 2024-02-27 DIAGNOSIS — M797 Fibromyalgia: Secondary | ICD-10-CM

## 2024-03-02 DIAGNOSIS — F329 Major depressive disorder, single episode, unspecified: Secondary | ICD-10-CM | POA: Diagnosis not present

## 2024-03-10 ENCOUNTER — Encounter: Payer: Self-pay | Admitting: Primary Care

## 2024-03-10 ENCOUNTER — Ambulatory Visit: Admitting: Primary Care

## 2024-03-10 VITALS — BP 126/72 | HR 108 | Temp 97.5°F | Ht 70.0 in | Wt 181.4 lb

## 2024-03-10 DIAGNOSIS — J4540 Moderate persistent asthma, uncomplicated: Secondary | ICD-10-CM

## 2024-03-10 DIAGNOSIS — Z87891 Personal history of nicotine dependence: Secondary | ICD-10-CM

## 2024-03-10 DIAGNOSIS — R0609 Other forms of dyspnea: Secondary | ICD-10-CM

## 2024-03-10 DIAGNOSIS — R55 Syncope and collapse: Secondary | ICD-10-CM

## 2024-03-10 MED ORDER — BUDESONIDE-FORMOTEROL FUMARATE 160-4.5 MCG/ACT IN AERO: 2.0000 | INHALATION_SPRAY | Freq: Two times a day (BID) | RESPIRATORY_TRACT | 5 refills | Status: DC

## 2024-03-10 NOTE — Progress Notes (Signed)
 @Patient  ID: [name], female    DOB: [birth year], 63 y.o.   MRN: [id number]  Chief Complaint  Patient presents with   Shortness of Breath    Exertion and standing for a long period of time. Pt states the s.o.b is worsening. Pt states she can work 10-15 minutes and she is out of breath.     Referring provider: Rilla Baller, MD  HPI: 63 y.o. woman whom we are seeing in follow up for evaluation of dyspnea on exertion, chronic cough, chest discomfort felt to be related to asthma.     Presents for planned follow-up.  In the interim CT chest high-res obtained to evaluate potential signs of ILD given underlying Sjogren's disease.  No evidence of ILD.  This did show right lower lobe abutting the diaphragm and nodular opacities.  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  Discussed this and plan for repeat CT scan in 3 months.   PFTs performed today.  These were reviewed in detail with patient shows normal spirometry, no bronchodilator response, normal lung volumes, normal DLCO.  Normal PFTs.  This combined with mosaicism versus even higher suspicion for underlying asthma.  Does explain at at length.  She did not start Advair as prescribed last visit.  She did start taking it this week.  Encouraged her to continue this.  She had lots of questions regarding symptoms and expectations.  These were all answered to the best my ability.   HPI at initial visit: She has in 2015 she had similar episode of dyspnea on exertion.  Notable on going up and down hills.  In [place] area.  This lasted for a few months but seem to resolve on its own.  Do not recall any intervention that make things better or worse.   She was in usual state of health until the late 2020.  Got COVID.  Since then has had a chronic cough.  The episodes or spasms of cough have gradually decreased in terms of frequency and severity.  However she is A chronic cough ever  since then.  Over last 2 years she is developed dyspnea on exertion.  Gradually worsening.  Had been work in the yard for hours at a time without issue.  Now, do about 15 minutes before stopping and resting.  In addition she has some chest discomfort.  Substernal.  Side of her chest.  Not worse with exertion.  Occurs at rest.  Comes and goes.   She endorses longstanding history of reflux.  Heartburn is improved but she continues to have reflux regurgitation symptoms quite frequently.  Associated dry raspy voice.  Cough continues despite PPI therapy.  In addition, she has postnasal drip.  Seasonal allergies.  Does not often have rhinorrhea more dripped on the back of her throat.  Cough is productive of sputum.  She denies any history of asthma.     She had recent chest x-ray 12/2021 and on my review interpretation is clear.  Borderline hyperinflated but cannot quite say so.  She has a history of eosinophilic esophagitis for which she follows with GI in addition to the reflux issues.  Recently diagnosed with Sjogren's within the last year or so.   PMH: Prediabetes, seasonal allergies, neuropathy, Hashimoto's thyroiditis, Sjogren's syndrome Surgical history: Breast augmentation Family history: First-degree relatives with emphysema, asthma Social history: Never smoker, lives in [place] , self-employed     03/10/2024- Interim hx  Discussed the use  of AI scribe software for clinical note transcription with the patient, who gave verbal consent to proceed.  History of Present Illness   [name] is a 63 year old female with asthma and Sjogren's syndrome who presents with worsening dyspnea on exertion and near-syncope episodes.She has experienced worsening dyspnea on exertion and near-syncope episodes since her last visit in July 2023. Previously able to work outside for extended periods, she now experiences significant shortness of breath and near-syncope after only 10 to 15  minutes of activity, especially in the heat. She describes an episode where she nearly passed out after bending over in a barn, occurring without warning. She has previously undergone pulmonary function tests which showed normal results and no obstructive defects. A high-resolution CT scan in July 2023 showed right lower lobe nodule and mosaicism. Previously prescribed Advair, she did not take medication consistently. She reports that her symptoms have worsened over time, and there is a history of prior COVID infection. She also has eosinophilic esophagitis, managed by her gastroenterologist, and has experienced eosinophilia with a recent eosinophil count of 461. She was previously on Protonix  but discontinued it due to concerns about kidney function, which has since improved.Her past medical history includes prediabetes, seasonal allergies, neuropathy, Hashimoto's thyroiditis, and Sjogren's syndrome. She mentions a family history of passing out episodes in her grandmother and father, who also had exercise-induced asthma and a cardiac condition requiring intervention. Socially, [place] and engages in gardening and outdoor activities, which have been significantly impacted by her symptoms. Her physical activity has decreased, leading to muscle weakness, particularly in her quadriceps, and she struggles with intentional exercise due to her symptoms.      Imaging: 01/22/24 CT chest wo contrast>> Scattered bilateral pulmonary nodules are unchanged since prior study. These are most compatible with benign nodules. No new or enlarging pulmonary nodules. No acute cardiopulmonary disease.   No Known Allergies  Immunization History  Administered Date(s) Administered   Influenza,inj,Quad PF,6+ Mos 08/10/2014, 08/14/2015, 05/08/2016, 06/23/2017, 09/14/2018, 06/01/2019, 06/04/2021, 06/19/2022   Tdap 05/01/2016   Zoster Recombinant(Shingrix ) 07/23/2022, 10/08/2022    Past Medical History:  Diagnosis  Date   Allergic rhinitis    Anxiety    Cervical dysplasia 1990   s/p conization   Chronic sinusitis    Depression    Fibromyalgia    Hashimoto's disease    Hiatal hernia    Hip fracture, left (HCC) 2005   s/p ORIF   Hypothyroidism    Migraine    Neuropathy    Rosacea    dx by derm, per patient   Schatzki's ring    Sjogren syndrome, unspecified (HCC)    Skin cancer    Basal Cell and Squamous Cell    Tobacco History: Social History   Tobacco Use  Smoking Status Former   Current packs/day: 0.00   Types: Cigarettes   Quit date: 1983   Years since quitting: 42.5   Passive exposure: Never  Smokeless Tobacco Never  Tobacco Comments   Pt states she smoked in high school and college. 01/23/22 ALS    Counseling given: Not Answered Tobacco comments: Pt states she smoked in high school and college. 01/23/22 ALS    Outpatient Medications Prior to Visit  Medication Sig Dispense Refill   ALPRAZolam  (XANAX ) 0.25 MG tablet Take 1 tablet (0.25 mg total) by mouth 2 (two) times daily as needed for anxiety. 50 tablet 0   ARMOUR THYROID  60 MG tablet TAKE 1 TABLET BY  MOUTH DAILY BEFORE BREAKFAST 90 tablet 3   CALCIUM-MAGNESIUM-ZINC PO Take by mouth daily.     cetirizine (ZYRTEC) 10 MG tablet Take 10 mg by mouth at bedtime.     cholecalciferol (VITAMIN D3) 25 MCG (1000 UNIT) tablet Take 1,000 Units by mouth daily.     Coenzyme Q10 100 MG capsule Take 1 capsule (100 mg total) by mouth daily.     Dulaglutide  (TRULICITY ) 1.5 MG/0.5ML SOAJ INJECT 1.5MG  SUBCUTANEOUSLY ONCE WEEKLY 2 mL 6   famotidine  (PEPCID ) 20 MG tablet Take 1 tablet (20 mg total) by mouth daily as needed for heartburn or indigestion.     fluorouracil (EFUDEX) 5 % cream Apply topically 2 (two) times daily.     fluticasone  (FLONASE ) 50 MCG/ACT nasal spray Place into the nose.     gabapentin  (NEURONTIN ) 300 MG capsule Take 1 capsule (300 mg total) by mouth 2 (two) times daily. 180 capsule 1   hydroxychloroquine  (PLAQUENIL ) 200  MG tablet TAKE 1 TABLET BY MOUTH TWICE A DAY 180 tablet 0   IVERMECTIN EX Apply topically. Gel     Loteprednol Etabonate (EYSUVIS OP) Apply to eye as needed.     methocarbamol  (ROBAXIN ) 500 MG tablet Take 1 tablet (500 mg total) by mouth 2 (two) times daily as needed for muscle spasms. 60 tablet 1   nystatin  ointment (MYCOSTATIN ) Apply 1 application topically 2 (two) times daily. (Patient taking differently: Apply 1 application  topically as needed.) 30 g 0   OVER THE COUNTER MEDICATION LUNA and GABA supplements     traMADol  (ULTRAM ) 50 MG tablet Take 0.5-1 tablets (25-50 mg total) by mouth 2 (two) times daily as needed for moderate pain (pain score 4-6) (sedation precautions). 20 tablet 0   TURMERIC PO Take by mouth.     Blood Glucose Monitoring Suppl DEVI 1 each by Does not apply route in the morning, at noon, and at bedtime. May substitute to any manufacturer covered by patient's insurance. (Patient not taking: Reported on 03/10/2024) 1 each 0   No facility-administered medications prior to visit.      Review of Systems  Review of Systems   Physical Exam  BP 126/72 (BP Location: Left Arm, Patient Position: Sitting, Cuff Size: Normal)   Pulse (!) 108   Temp (!) 97.5 F (36.4 C) (Temporal)   Ht 5' 10 (1.778 m)   Wt 181 lb 6.4 oz (82.3 kg)   LMP 06/27/2019   SpO2 97%   BMI 26.03 kg/m  Physical Exam Constitutional:      General: She is not in acute distress.    Appearance: She is well-developed. She is not ill-appearing.  HENT:     Head: Normocephalic and atraumatic.  Pulmonary:     Breath sounds: No decreased breath sounds, wheezing or rhonchi.  Musculoskeletal:        General: Normal range of motion.  Skin:    General: Skin is warm and dry.  Neurological:     General: No focal deficit present.     Mental Status: She is alert and oriented to person, place, and time.  Psychiatric:        Mood and Affect: Mood normal.        Behavior: Behavior normal.      Lab  Results:  CBC    Component Value Date/Time   WBC 5.3 12/17/2023 1049   RBC 4.12 12/17/2023 1049   HGB 12.5 12/17/2023 1049   HCT 37.4 12/17/2023 1049   PLT 256 12/17/2023 1049  MCV 90.8 12/17/2023 1049   MCH 30.3 12/17/2023 1049   MCHC 33.4 12/17/2023 1049   RDW 13.4 12/17/2023 1049   LYMPHSABS 1.5 08/26/2023 1014   MONOABS 0.6 08/26/2023 1014   EOSABS 461 12/17/2023 1049   BASOSABS 69 12/17/2023 1049    BMET    Component Value Date/Time   NA 139 01/21/2024 1012   K 4.1 01/21/2024 1012   CL 104 01/21/2024 1012   CO2 28 01/21/2024 1012   GLUCOSE 76 01/21/2024 1012   BUN 18 01/21/2024 1012   CREATININE 1.09 01/21/2024 1012   CREATININE 1.15 (H) 12/17/2023 1049   CALCIUM 9.6 01/21/2024 1012   GFRNONAA 64 11/08/2020 1215   GFRAA 74 11/08/2020 1215    BNP No results found for: BNP  ProBNP No results found for: PROBNP  Imaging: No results found.   Assessment & Plan:    1. DOE (dyspnea on exertion) (Primary)  2. Pre-syncope - Ambulatory referral to Cardiology  3. Moderate persistent asthma without complication   Assessment and Plan    Asthma High suspicion for asthma or reactive airway disease, exacerbated post-COVID infection. Previous pulmonary function tests were normal, but CT imaging showed mosaicism, suggesting small airway disease. Symptoms include dyspnea on exertion, worsened by heat and standing, and near-syncope episodes. Previous inconsistent use of Advair. - Order methylcholine challenge test to confirm asthma diagnosis. - Start Symbicort  inhaler, two puffs in the morning and evening. - Prescribe Singulair to manage eosinophilia and asthma symptoms. - Advise follow-up with Dr. Annella after starting Symbicort  and completing methylcholine challenge.  Pre-syncope Differential includes POTS and dysautonomia - Refer to cardiology for evaluation of POTS and potential Holter monitor.  Eosinophilic esophagitis Eosinophilic esophagitis  managed by GI. Symptoms include mucus in the throat, previously managed with Protonix , which was discontinued due to kidney concerns. - Follow up with GI  Seasonal allergies Seasonal allergies potentially contributing to respiratory symptoms. - Start Singulair to manage allergy symptoms.  Recording duration: 28 minutes      Almarie LELON Ferrari, NP 03/10/2024

## 2024-03-10 NOTE — Patient Instructions (Addendum)
 Recommendations  Start Symbicort  inhaler- take 2 puffs morning and evening (rinse mouth after use) Start Singulair at bedtime - monitor for nightmares and mood changes  Orders: Methacholine challenge  (rule out asthma) Refer to cardiology for POTS work up/ ask about holter monitor  Follow-up Dr. Annella      Montelukast (Singulair) Tablets What is this medication? MONTELUKAST (mon te LOO kast) prevents and treats the symptoms of asthma and allergies. It works by decreasing inflammation in the airways, making it easier to breathe. Do not use this medication to treat a sudden asthma attack. This medicine may be used for other purposes; ask your health care provider or pharmacist if you have questions. COMMON BRAND NAME(S): Singulair What should I tell my care team before I take this medication? They need to know if you have any of these conditions: Liver disease An unusual or allergic reaction to montelukast, other medications, foods, dyes, or preservatives Pregnant or trying to get pregnant Breastfeeding How should I use this medication? Take this medication by mouth with water. Take it as directed on the prescription label at the same time every day. You can take this medication with or without food. If it upsets your stomach, take it with food. Keep taking it unless your care team tells you to stop. A special MedGuide will be given to you by the pharmacist with each prescription and refill. Be sure to read this information carefully each time. Talk to your care team about the use of this medication in children. While this medication may be prescribed for children as young as 15 years for selected conditions, precautions do apply. Overdosage: If you think you have taken too much of this medicine contact a poison control center or emergency room at once. NOTE: This medicine is only for you. Do not share this medicine with others. What if I miss a dose? If you miss a dose, skip it.  Take your next dose at the normal time. Do not take extra or 2 doses at the same time to make up for the missed dose. What may interact with this medication? Medications for seizures, such as phenytoin, phenobarbital, carbamazepine Rifabutin Rifampin This list may not describe all possible interactions. Give your health care provider a list of all the medicines, herbs, non-prescription drugs, or dietary supplements you use. Also tell them if you smoke, drink alcohol, or use illegal drugs. Some items may interact with your medicine. What should I watch for while using this medication? Visit your care team for regular checks on your progress. Tell your care team if your allergy or asthma symptoms do not improve. Take your medication even when you do not have symptoms. If you have asthma, talk to your care team about what to do in an acute asthma attack. Always have your rescue medication for asthma attacks with you. This medication may cause thoughts of suicide or depression. This includes sudden changes in mood, behaviors, or thoughts. Also watch for sudden changes in feelings and behaviors, such as feeling anxious, agitated, panicky, irritable, hostile, aggressive, impulsive, severely restless, overly excited and hyperactive, or not being able to sleep. Call your care team right away if you experience a change in behavior or worsening depression. What side effects may I notice from receiving this medication? Side effects that you should report to your care team as soon as possible: Allergic reactions--skin rash, itching, hives, swelling of the face, lips, tongue, or throat Flu-like symptoms--fever, chills, muscle pain, cough, headache, fatigue Mood and  behavior changes such as anxiety, nervousness, confusion, hallucinations, irritability, hostility, thoughts of suicide or self-harm, worsening mood, feelings of depression Pain, tingling, or numbness in the hands or feet Sinus pain or pressure around  the face or forehead Trouble sleeping Vivid dreams or nightmares Side effects that usually do not require medical attention (report to your care team if they continue or are bothersome): Cough Diarrhea Headache Runny or stuffy nose Sore throat Stomach pain This list may not describe all possible side effects. Call your doctor for medical advice about side effects. You may report side effects to FDA at 1-800-FDA-1088. Where should I keep my medication? Keep out of the reach of children and pets. Store at room temperature between 15 and 30 degrees C (59 and 86 degrees F). Protect from light and moisture. Protect from light and moisture. Keep the container tightly closed. Get rid of any unused medication after the expiration date. To get rid of medications that are no longer needed or expired: Take the medication to a medication take-back program. Check with your pharmacy or law enforcement to find a location. If you cannot return the medication, check the label or package insert to see if the medication should be thrown out in the garbage or flushed down the toilet. If you are not sure, ask your care team. If it is safe to put in the trash, empty the medication out of the container. Mix the medication with cat litter, dirt, coffee grounds, or other unwanted substance. Seal the mixture in a bag or container. Put it in the trash. NOTE: This sheet is a summary. It may not cover all possible information. If you have questions about this medicine, talk to your doctor, pharmacist, or health care provider.  2024 Elsevier/Gold Standard (2022-03-11 00:00:00)   Asthma, Adult  Asthma is a condition that causes swelling and narrowing of the airways. These are the passages that lead from the nose and mouth down into the lungs. When asthma symptoms get worse it is called an asthma attack or flare. This can make it hard to breathe. Asthma flares can range from minor to life-threatening. There is no cure for  asthma, but medicines and lifestyle changes can help to control it. What are the causes? It is not known exactly what causes asthma, but certain things can cause asthma symptoms to get worse (triggers). What can trigger an asthma attack? Cigarette smoke. Mold. Dust. Your pet's skin flakes (dander). Cockroaches. Pollen. Air pollution (like household cleaners, wood smoke, smog, or Therapist, occupational). What are the signs or symptoms? Trouble breathing (shortness of breath). Coughing. Making high-pitched whistling sounds when you breathe, most often when you breathe out (wheezing). Chest tightness. Tiredness with little activity. Poor exercise tolerance. How is this treated? Controller medicines that help prevent asthma symptoms. Fast-acting reliever or rescue medicines. These give short-term relief of asthma symptoms. Allergy medicines if your attacks are brought on by allergens. Medicines to help control the body's defense (immune) system. Staying away from the things that cause asthma attacks. Follow these instructions at home: Avoiding triggers in your home Do not allow anyone to smoke in your home. Limit use of fireplaces and wood stoves. Get rid of pests (such as roaches and mice) and their droppings. Keep your home clean. Clean your floors. Dust regularly. Use cleaning products that do not smell. Wash bed sheets and blankets every week in hot water. Dry them in a dryer. Have someone vacuum when you are not home. Change your heating and air  conditioning filters often. Use blankets that are made of polyester or cotton. General instructions Take over-the-counter and prescription medicines only as told by your doctor. Do not smoke or use any products that contain nicotine or tobacco. If you need help quitting, ask your doctor. Stay away from secondhand smoke. Avoid doing things outdoors when allergen counts are high and when air quality is low. Warm up before you exercise. Take  time to cool down after exercise. Use a peak flow meter as told by your doctor. A peak flow meter is a tool that measures how well your lungs are working. Keep track of the peak flow meter's readings. Write them down. Follow your asthma action plan. This is a written plan for taking care of your asthma and treating your attacks. Make sure you get all the shots (vaccines) that your doctor recommends. Ask your doctor about a flu shot and a pneumonia shot. Keep all follow-up visits. Contact a doctor if: You have wheezing, shortness of breath, or a cough even while taking medicine to prevent attacks. The mucus you cough up (sputum) is thicker than usual. The mucus you cough up changes from clear or white to yellow, green, gray, or is bloody. You have problems from the medicine you are taking, such as: A rash. Itching. Swelling. Trouble breathing. You need reliever medicines more than 2-3 times a week. Your peak flow reading is still at 50-79% of your personal best after following the action plan for 1 hour. You have a fever. Get help right away if: You seem to be worse and are not responding to medicine during an asthma attack. You are short of breath even at rest. You get short of breath when doing very little activity. You have trouble eating, drinking, or talking. You have chest pain or tightness. You have a fast heartbeat. Your lips or fingernails start to turn blue. You are light-headed or dizzy, or you faint. Your peak flow is less than 50% of your personal best. You feel too tired to breathe normally. These symptoms may be an emergency. Get help right away. Call 911. Do not wait to see if the symptoms will go away. Do not drive yourself to the hospital. Summary Asthma is a long-term (chronic) condition in which the airways get tight and narrow. An asthma attack can make it hard to breathe. Asthma cannot be cured, but medicines and lifestyle changes can help control it. Make sure  you understand how to avoid triggers and how and when to use your medicines. Avoid things that can cause allergy symptoms (allergens). These include animal skin flakes (dander) and pollen from trees or grass. Avoid things that pollute the air. These may include household cleaners, wood smoke, smog, or chemical odors. This information is not intended to replace advice given to you by your health care provider. Make sure you discuss any questions you have with your health care provider. Document Revised: 05/21/2021 Document Reviewed: 05/21/2021 Elsevier Patient Education  2024 [employer].

## 2024-03-10 NOTE — Progress Notes (Signed)
 Office Visit Note  Patient: [name]             Date of Birth: November 09, 1960           MRN: [id number]             PCP: Rilla Baller, MD Referring: Rilla Baller, MD Visit Date: 03/24/2024 Occupation: @GUAROCC @  Subjective:  Medication monitoring  History of Present Illness: [name] is a 63 y.o. female with history of sjogren's syndrome and osteoarthritis.  Patient remains on  Plaquenil  200 mg 1 tablet by mouth twice daily.  She is tolerating Plaquenil  without any side effects and has not had any recent gaps in therapy.  Patient continues to have chronic sicca symptoms especially mouth dryness.  She experiences intermittent salivary gland inflammation and occasionally salivary stones. Patient's primary concern has been symptoms consistent with possible POTS.  Patient states that her blood pressure has been low and her heart rate has been elevated.  She has also had increased breathlessness and is under the care of pulmonology.  She has been started on Singulair  and Symbicort  but has not yet noticed any clinical benefit.  Patient is concerned about dysautonomia and POTS and will be seeking further evaluation.   Activities of Daily Living:  Patient reports morning stiffness for 1 hour.   Patient Reports nocturnal pain.  Difficulty dressing/grooming: Reports Difficulty climbing stairs: Reports Difficulty getting out of chair: Reports Difficulty using hands for taps, buttons, cutlery, and/or writing: Reports  Review of Systems  Constitutional:  Positive for fatigue.  HENT:  Positive for mouth dryness. Negative for mouth sores.   Eyes:  Positive for dryness.  Respiratory:  Positive for shortness of breath.   Cardiovascular:  Positive for palpitations. Negative for chest pain.  Gastrointestinal:  Positive for constipation. Negative for blood in stool and diarrhea.  Endocrine: Negative for increased urination.  Genitourinary:  Negative for involuntary urination.   Musculoskeletal:  Positive for joint pain, gait problem, joint pain, joint swelling, myalgias, muscle weakness, morning stiffness, muscle tenderness and myalgias.  Skin:  Positive for color change, rash, hair loss and sensitivity to sunlight.  Allergic/Immunologic: Positive for susceptible to infections.  Neurological:  Positive for dizziness and headaches.  Hematological:  Negative for swollen glands.  Psychiatric/Behavioral:  Positive for depressed mood. Negative for sleep disturbance. The patient is nervous/anxious.     PMFS History:  Patient Active Problem List   Diagnosis Date Noted   Squamous cell cancer of skin of nose 02/26/2024   Renal insufficiency 02/26/2024   Hypervitaminosis B6 02/26/2024   Fall with injury 11/25/2023   Pulmonary nodule, right 11/04/2023   Left sided abdominal pain 08/26/2023   Salivary gland enlargement 07/30/2023   Sinus headache 04/15/2023   Dysuria 01/08/2023   Microhematuria 01/08/2023   Skin rash 01/08/2023   Malaise and fatigue 11/07/2022   Constipation 11/07/2022   Lateral pain of right hip 11/07/2022   Gastroesophageal reflux disease 10/09/2022   Low iron  stores 10/09/2022   Pre-op evaluation 10/08/2022   Bruxism 07/11/2022   Urticaria 06/20/2022   Overweight (BMI 25.0-29.9) 06/04/2021   Sjogren's disease (HCC) 01/11/2021   Dyspnea on exertion 01/11/2021   Malar rash 09/27/2020   Dry mouth 09/27/2020   Pernio, initial encounter 09/27/2020   Eosinophilic esophagitis 09/27/2020   Menopause 11/29/2019   Type 2 diabetes mellitus with other specified complication (HCC) 10/25/2019   Chronic cough 10/18/2019   Health maintenance examination 09/19/2019   Fibromyalgia 09/08/2019   Peripheral neuropathy  06/14/2019   BPV (benign positional vertigo), right 06/14/2019   Vitamin D  deficiency 09/14/2018   Fatigue 10/01/2017   Generalized anxiety disorder 11/21/2015   Polyarthralgia 08/14/2015   Vitamin B12 deficiency 08/10/2014   Insomnia  06/30/2012   Migraine with status migrainosus 05/23/2011   MDD (major depressive disorder), single episode, in full remission (HCC)    Hypothyroidism due to Hashimoto's thyroiditis     Past Medical History:  Diagnosis Date   Allergic rhinitis    Anxiety    Cervical dysplasia 1990   s/p conization   Chronic sinusitis    Depression    Fibromyalgia    Hashimoto's disease    Hiatal hernia    Hip fracture, left (HCC) 2005   s/p ORIF   Hypothyroidism    Migraine    Neuropathy    Rosacea    dx by derm, per patient   Schatzki's ring    Sjogren syndrome, unspecified (HCC)    Skin cancer    Basal Cell and Squamous Cell   Spider bite 03/19/2024    Family History  Problem Relation Age of Onset   Arthritis/Rheumatoid Mother        RA and OA   Asthma Mother    Breast cancer Maternal Aunt 48   Irregular heart beat Father        need ablation   Basal cell carcinoma Father    Asthma Father        exercise induced asthma    Arthritis Maternal Grandmother    Heart disease Maternal Grandmother    Lung cancer Paternal Grandfather    Drug abuse Brother    Alcohol abuse Brother    Appendicitis Son    Allergies Son    Migraines Son    Eczema Son    Healthy Son    Allergies Daughter    Healthy Daughter    Colon cancer Neg Hx    Pancreatic cancer Neg Hx    Stomach cancer Neg Hx    Esophageal cancer Neg Hx    Rectal cancer Neg Hx    Past Surgical History:  Procedure Laterality Date   BREAST ENHANCEMENT SURGERY  2007   BREAST IMPLANT REMOVAL Bilateral 05/21/2018   COLONOSCOPY  2014   1 benign polyp, rpt 10 yrs (Pyrtle)   DILATION AND CURETTAGE OF UTERUS  1989   ORIF HIP FRACTURE  2005   Daldorf   SKIN CANCER EXCISION  01/07/2024   nose   TONSILLECTOMY AND ADENOIDECTOMY     TOTAL HIP ARTHROPLASTY Left 2005   second surgery    Social History   Social History Narrative   11/29/19   From: raised in [place], but in the area for 25 years   Living: with husband, Todd  (1988)   Work: retired, 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)      Family: Chiquita and [name] - grown adults - daughter is psych PA in [place]      Enjoys: pets, photography, gardening, exercise, cooking, crafts, church activities      Exercise: cardio 3 days a week, weight lifting 2 times a week   Diet: not great, has reduced sugar      Safety   Seat belts: Yes    Guns: Yes  and secure   Safe in relationships: Yes    Immunization History  Administered Date(s) Administered   Influenza,inj,Quad PF,6+ Mos 08/10/2014, 08/14/2015, 05/08/2016, 06/23/2017, 09/14/2018, 06/01/2019, 06/04/2021, 06/19/2022   Tdap 05/01/2016   Zoster Recombinant(Shingrix ) 07/23/2022, 10/08/2022  Objective: Vital Signs: BP (!) 84/59 (BP Location: Right Arm, Patient Position: Sitting, Cuff Size: Normal)   Pulse (!) 111   Resp 16   Ht 5' 10 (1.778 m)   Wt 178 lb 6.4 oz (80.9 kg)   LMP 06/27/2019   BMI 25.60 kg/m    Physical Exam Vitals and nursing note reviewed.  Constitutional:      Appearance: She is well-developed.  HENT:     Head: Normocephalic and atraumatic.  Eyes:     Conjunctiva/sclera: Conjunctivae normal.  Cardiovascular:     Rate and Rhythm: Normal rate and regular rhythm.     Heart sounds: Normal heart sounds.  Pulmonary:     Effort: Pulmonary effort is normal.     Breath sounds: Normal breath sounds.  Abdominal:     General: Bowel sounds are normal.     Palpations: Abdomen is soft.  Musculoskeletal:     Cervical back: Normal range of motion.  Lymphadenopathy:     Cervical: No cervical adenopathy.  Skin:    General: Skin is warm and dry.     Capillary Refill: Capillary refill takes less than 2 seconds.  Neurological:     Mental Status: She is alert and oriented to person, place, and time.  Psychiatric:        Behavior: Behavior normal.      Musculoskeletal Exam: Generalized hyperalgesia and positive tender points noted.  C-spine, thoracic spine, lumbar spine and good range of  motion.  Shoulder joints, elbow joints, wrist joints, MCPs, PIPs, DIPs have good range of motion with no synovitis.  PIP and DIP thickening consistent with osteoarthritis of both hands.  Left hip replacement has good range of motion.  Right hip has good range of motion.  Knee joints have good range of motion with some crepitus but no warmth or effusion.  Ankle joints have good range of motion with no tenderness or joint swelling.  CDAI Exam: CDAI Score: -- Patient Global: --; Provider Global: -- Swollen: --; Tender: -- Joint Exam 03/24/2024   No joint exam has been documented for this visit   There is currently no information documented on the homunculus. Go to the Rheumatology activity and complete the homunculus joint exam.  Investigation: No additional findings.  Imaging: No results found.  Recent Labs: Lab Results  Component Value Date   WBC 5.3 12/17/2023   HGB 12.5 12/17/2023   PLT 256 12/17/2023   NA 139 01/21/2024   K 4.1 01/21/2024   CL 104 01/21/2024   CO2 28 01/21/2024   GLUCOSE 76 01/21/2024   BUN 18 01/21/2024   CREATININE 1.09 01/21/2024   BILITOT 0.4 12/17/2023   ALKPHOS 74 08/26/2023   AST 24 12/17/2023   ALT 18 12/17/2023   PROT 7.0 12/17/2023   ALBUMIN 4.2 12/31/2023   CALCIUM 9.6 01/21/2024   GFRAA 74 11/08/2020    Speciality Comments: PLQ Eye Exam: 09/30/2023 WNL @ [facility] Follow up 1 year.  Procedures:  No procedures performed Allergies: Patient has no known allergies.    Assessment / Plan:     Visit Diagnoses: Sjogren's syndrome with other organ involvement (HCC) - +ANA 1: 80 nuclear dots, +Ro, RF-,  History of sicca symptoms, Raynaud's: Patient continues to have chronic sicca symptoms. Patient reports having occasional episodes of inflammation involving the salivary glands as well as an occasional salivary stone.  She has been taking Plaquenil  200 mg 1 tablet by mouth twice daily.  No signs of inflammatory arthritis noted  on  examination today.   Lab work from 12/17/23 was reviewed today in the office: complements WNL, ANA 1:320 nuclear, discrete nuclear dots, ESR 2, Ro antibody negative.  Discussed that she may have seronegative Sjogren's versus undifferentiated connective tissue disease.  She would like to remain on Plaquenil  as prescribed for now.  She would like to have updated lab work at her next office visit.  Discussed the importance of having updated CBC and CMP at least every 5 months to monitor for drug toxicity.  She will notify us  if she develops any new or worsening symptoms.  High risk medication use - Plaquenil  200 mg 1 tablet by mouth twice daily. PLQ Eye Exam: 09/30/2023 WNL @ [facility] Follow up 1 year.  BMP updated on 01/21/24. CBC updated on 12/17/23.  She will be having updated lab work with her PCP and will have results ported to our office.  Primary osteoarthritis of both hands: PIP and DIP thickening.  No synovitis noted.  Complete fist formation bilaterally.  History of hip replacement, total, left: Doing well. No groin pain currently.   Chondromalacia of patella, unspecified laterality - Followed by Dr. Dalldorf.  Good ROM of both knees-no warmth or effusion noted.    Chronic midline low back pain without sciatica: No symptoms of sciatica at this time.  Other fatigue: Chronic  Fibromyalgia: She continues to experience intermittent myalgias and muscle tenderness due to fibromyalgia.  Discussed the importance of regular exercise and good sleep hygiene.  Moderate persistent asthma without complication: Under the care of [place].  Chest CT updated on 01/22/2024: Scattered pulmonary nodules are unchanged since prior study.  No acute cardiopulmonary disease. Patient has been started on Symbicort  and Singulair .  She has not yet noticed any clinical benefit.  No wheezing noted today.  She continues to have ongoing breathlessness as well as increased shortness of breath especially on  exertion.  Pre-syncope: Patient has been referred to Dr. Dawna Bruckner for further evaluation of possible POTS.  Her blood pressure was 84/59 today in the office and heart rate was 111.  Other medical conditions are listed as follows:   Muscular deconditioning  Muscle weakness  Eosinophilic esophagitis  Other polyneuropathy  Excessive sweating  Vitamin B12 deficiency  Vitamin D  deficiency  Hypothyroidism due to Hashimoto's thyroiditis  BPV (benign positional vertigo), right  History of basal cell cancer - Left Clavicle  History of squamous cell carcinoma - Nose-Underwent Moh's.   Orders: No orders of the defined types were placed in this encounter.  Meds ordered this encounter  Medications   hydroxychloroquine  (PLAQUENIL ) 200 MG tablet    Sig: Take 1 tablet (200 mg total) by mouth 2 (two) times daily.    Dispense:  180 tablet    Refill:  0     Follow-Up Instructions: Return in about 5 months (around 08/24/2024) for Sjogren's syndrome.   Waddell CHRISTELLA Craze, PA-C  Note - This record has been created using Dragon software.  Chart creation errors have been sought, but may not always  have been located. Such creation errors do not reflect on  the standard of medical care.

## 2024-03-12 ENCOUNTER — Other Ambulatory Visit: Payer: Self-pay

## 2024-03-12 MED ORDER — MONTELUKAST SODIUM 10 MG PO TABS: 10.0000 mg | ORAL_TABLET | Freq: Every day | ORAL | 11 refills | Status: DC

## 2024-03-15 ENCOUNTER — Telehealth: Payer: Self-pay

## 2024-03-15 ENCOUNTER — Other Ambulatory Visit: Payer: Self-pay

## 2024-03-15 ENCOUNTER — Encounter: Payer: Self-pay | Admitting: Primary Care

## 2024-03-15 DIAGNOSIS — J4540 Moderate persistent asthma, uncomplicated: Secondary | ICD-10-CM

## 2024-03-15 DIAGNOSIS — R0609 Other forms of dyspnea: Secondary | ICD-10-CM

## 2024-03-15 NOTE — Telephone Encounter (Signed)
 Copied from CRM [id number]. Topic: General - Call Back - No Documentation >> Mar 15, 2024  2:14 PM [name] wrote: Reason for CRM: Patient is requesting [name] call her back once again, patient does not want to go into details with PAS.  States location needs to be [facility] instead of [facility].  Spoke with patient regarding prior message . Patient stated she wanted to have her PFT at [facility] 1st choice or [place] 2nd choice . Placed a new order for patient to have PFT at [facility].Patient's voice was understanding.Nothing else further needed.

## 2024-03-16 MED ORDER — ALBUTEROL SULFATE HFA 108 (90 BASE) MCG/ACT IN AERS: 2.0000 | INHALATION_SPRAY | Freq: Four times a day (QID) | RESPIRATORY_TRACT | 2 refills | Status: AC | PRN

## 2024-03-16 NOTE — Telephone Encounter (Signed)
 RX Albuterol  sent to pharmacy

## 2024-03-16 NOTE — Telephone Encounter (Signed)
 Please advise as albuterol  is not In med list

## 2024-03-17 DIAGNOSIS — D225 Melanocytic nevi of trunk: Secondary | ICD-10-CM | POA: Diagnosis not present

## 2024-03-17 DIAGNOSIS — D485 Neoplasm of uncertain behavior of skin: Secondary | ICD-10-CM | POA: Diagnosis not present

## 2024-03-17 DIAGNOSIS — D2272 Melanocytic nevi of left lower limb, including hip: Secondary | ICD-10-CM | POA: Diagnosis not present

## 2024-03-17 DIAGNOSIS — C44519 Basal cell carcinoma of skin of other part of trunk: Secondary | ICD-10-CM | POA: Diagnosis not present

## 2024-03-17 DIAGNOSIS — L821 Other seborrheic keratosis: Secondary | ICD-10-CM | POA: Diagnosis not present

## 2024-03-17 DIAGNOSIS — D2261 Melanocytic nevi of right upper limb, including shoulder: Secondary | ICD-10-CM | POA: Diagnosis not present

## 2024-03-17 DIAGNOSIS — D2262 Melanocytic nevi of left upper limb, including shoulder: Secondary | ICD-10-CM | POA: Diagnosis not present

## 2024-03-17 DIAGNOSIS — D0439 Carcinoma in situ of skin of other parts of face: Secondary | ICD-10-CM | POA: Diagnosis not present

## 2024-03-17 DIAGNOSIS — D2271 Melanocytic nevi of right lower limb, including hip: Secondary | ICD-10-CM | POA: Diagnosis not present

## 2024-03-19 DIAGNOSIS — T63301A Toxic effect of unspecified spider venom, accidental (unintentional), initial encounter: Secondary | ICD-10-CM

## 2024-03-19 HISTORY — DX: Toxic effect of unspecified spider venom, accidental (unintentional), initial encounter: T63.301A

## 2024-03-20 DIAGNOSIS — F411 Generalized anxiety disorder: Secondary | ICD-10-CM | POA: Diagnosis not present

## 2024-03-20 DIAGNOSIS — J45909 Unspecified asthma, uncomplicated: Secondary | ICD-10-CM | POA: Diagnosis not present

## 2024-03-20 DIAGNOSIS — L03115 Cellulitis of right lower limb: Secondary | ICD-10-CM | POA: Diagnosis not present

## 2024-03-20 DIAGNOSIS — E039 Hypothyroidism, unspecified: Secondary | ICD-10-CM | POA: Diagnosis not present

## 2024-03-20 DIAGNOSIS — E119 Type 2 diabetes mellitus without complications: Secondary | ICD-10-CM | POA: Diagnosis not present

## 2024-03-21 ENCOUNTER — Encounter: Payer: Self-pay | Admitting: Family Medicine

## 2024-03-22 NOTE — Telephone Encounter (Signed)
 Ok to update Tdap as per [employer] note

## 2024-03-24 ENCOUNTER — Encounter (HOSPITAL_COMMUNITY): Payer: Self-pay

## 2024-03-24 ENCOUNTER — Ambulatory Visit: Payer: Self-pay | Attending: Physician Assistant | Admitting: Physician Assistant

## 2024-03-24 ENCOUNTER — Encounter: Payer: Self-pay | Admitting: Physician Assistant

## 2024-03-24 VITALS — BP 84/59 | HR 111 | Resp 16 | Ht 70.0 in | Wt 178.4 lb

## 2024-03-24 DIAGNOSIS — G6289 Other specified polyneuropathies: Secondary | ICD-10-CM | POA: Diagnosis not present

## 2024-03-24 DIAGNOSIS — M6281 Muscle weakness (generalized): Secondary | ICD-10-CM

## 2024-03-24 DIAGNOSIS — M3509 Sicca syndrome with other organ involvement: Secondary | ICD-10-CM | POA: Diagnosis not present

## 2024-03-24 DIAGNOSIS — R29898 Other symptoms and signs involving the musculoskeletal system: Secondary | ICD-10-CM | POA: Diagnosis not present

## 2024-03-24 DIAGNOSIS — E538 Deficiency of other specified B group vitamins: Secondary | ICD-10-CM

## 2024-03-24 DIAGNOSIS — Z96642 Presence of left artificial hip joint: Secondary | ICD-10-CM

## 2024-03-24 DIAGNOSIS — H8111 Benign paroxysmal vertigo, right ear: Secondary | ICD-10-CM

## 2024-03-24 DIAGNOSIS — M2240 Chondromalacia patellae, unspecified knee: Secondary | ICD-10-CM | POA: Diagnosis not present

## 2024-03-24 DIAGNOSIS — R55 Syncope and collapse: Secondary | ICD-10-CM

## 2024-03-24 DIAGNOSIS — R5383 Other fatigue: Secondary | ICD-10-CM | POA: Diagnosis not present

## 2024-03-24 DIAGNOSIS — R61 Generalized hyperhidrosis: Secondary | ICD-10-CM

## 2024-03-24 DIAGNOSIS — M19041 Primary osteoarthritis, right hand: Secondary | ICD-10-CM | POA: Diagnosis not present

## 2024-03-24 DIAGNOSIS — Z85828 Personal history of other malignant neoplasm of skin: Secondary | ICD-10-CM

## 2024-03-24 DIAGNOSIS — Z8589 Personal history of malignant neoplasm of other organs and systems: Secondary | ICD-10-CM

## 2024-03-24 DIAGNOSIS — Z79899 Other long term (current) drug therapy: Secondary | ICD-10-CM

## 2024-03-24 DIAGNOSIS — M545 Low back pain, unspecified: Secondary | ICD-10-CM | POA: Diagnosis not present

## 2024-03-24 DIAGNOSIS — E063 Autoimmune thyroiditis: Secondary | ICD-10-CM

## 2024-03-24 DIAGNOSIS — E559 Vitamin D deficiency, unspecified: Secondary | ICD-10-CM

## 2024-03-24 DIAGNOSIS — M797 Fibromyalgia: Secondary | ICD-10-CM

## 2024-03-24 DIAGNOSIS — K200 Eosinophilic esophagitis: Secondary | ICD-10-CM

## 2024-03-24 DIAGNOSIS — J4540 Moderate persistent asthma, uncomplicated: Secondary | ICD-10-CM

## 2024-03-24 DIAGNOSIS — M19042 Primary osteoarthritis, left hand: Secondary | ICD-10-CM

## 2024-03-24 DIAGNOSIS — G8929 Other chronic pain: Secondary | ICD-10-CM

## 2024-03-24 MED ORDER — HYDROXYCHLOROQUINE SULFATE 200 MG PO TABS: 200.0000 mg | ORAL_TABLET | Freq: Two times a day (BID) | ORAL | 0 refills | Status: DC

## 2024-03-25 DIAGNOSIS — F329 Major depressive disorder, single episode, unspecified: Secondary | ICD-10-CM | POA: Diagnosis not present

## 2024-03-26 ENCOUNTER — Telehealth: Payer: Self-pay | Admitting: Cardiovascular Disease

## 2024-03-26 NOTE — Telephone Encounter (Signed)
 Patient requesting to switch from Dr. Gollan to Dr. Lonni, please advise

## 2024-04-02 ENCOUNTER — Telehealth: Payer: Self-pay | Admitting: Primary Care

## 2024-04-02 DIAGNOSIS — J4540 Moderate persistent asthma, uncomplicated: Secondary | ICD-10-CM

## 2024-04-02 DIAGNOSIS — R0609 Other forms of dyspnea: Secondary | ICD-10-CM

## 2024-04-02 NOTE — Telephone Encounter (Signed)
 Called [place] [facility] to get patient scheduled. [facility] is currently not doing PFTs right now and the order needs to be placed for [facility] in order for the patient to have the methacholine challenge. Please place order for [name] so they can reach out to patient to get scheduled.

## 2024-04-02 NOTE — Telephone Encounter (Signed)
 Routing to the front to call to schedule methacholine challenge

## 2024-04-05 ENCOUNTER — Other Ambulatory Visit: Payer: Self-pay | Admitting: Family Medicine

## 2024-04-05 ENCOUNTER — Other Ambulatory Visit: Payer: Self-pay | Admitting: Primary Care

## 2024-04-05 DIAGNOSIS — F325 Major depressive disorder, single episode, in full remission: Secondary | ICD-10-CM

## 2024-04-05 NOTE — Telephone Encounter (Signed)
 Name of Medication:  Alprazolam  Name of Pharmacy:  [facility] Last Fill or Written Date and Quantity:  02/25/24, #50 Last Office Visit and Type:  02/25/24, mood f/u Next Office Visit and Type:  04/30/24, CPE Last Controlled Substance Agreement Date:  06/28/14 Last UDS:  06/28/14

## 2024-04-05 NOTE — Telephone Encounter (Signed)
 Order placed for [facility]. Almarie Ferrari, NP will be out of office this week so order will be signed when she is back. NFN

## 2024-04-06 NOTE — Telephone Encounter (Signed)
 ERx

## 2024-04-07 NOTE — Telephone Encounter (Signed)
 Voicemail was left for RT to schedule patient for this.

## 2024-04-09 DIAGNOSIS — L570 Actinic keratosis: Secondary | ICD-10-CM | POA: Diagnosis not present

## 2024-04-09 DIAGNOSIS — L304 Erythema intertrigo: Secondary | ICD-10-CM | POA: Diagnosis not present

## 2024-04-09 DIAGNOSIS — C44519 Basal cell carcinoma of skin of other part of trunk: Secondary | ICD-10-CM | POA: Diagnosis not present

## 2024-04-09 DIAGNOSIS — L259 Unspecified contact dermatitis, unspecified cause: Secondary | ICD-10-CM | POA: Diagnosis not present

## 2024-04-09 NOTE — Telephone Encounter (Signed)
 Methacholine Challenge scheduled.

## 2024-04-15 DIAGNOSIS — F329 Major depressive disorder, single episode, unspecified: Secondary | ICD-10-CM | POA: Diagnosis not present

## 2024-04-15 NOTE — Telephone Encounter (Signed)
**Note De-identified  [place] Obfuscation** Please advise 

## 2024-04-15 NOTE — Telephone Encounter (Signed)
 Yes that is fine

## 2024-04-17 ENCOUNTER — Other Ambulatory Visit: Payer: Self-pay | Admitting: Family Medicine

## 2024-04-17 DIAGNOSIS — E1169 Type 2 diabetes mellitus with other specified complication: Secondary | ICD-10-CM

## 2024-04-17 DIAGNOSIS — M3509 Sicca syndrome with other organ involvement: Secondary | ICD-10-CM

## 2024-04-17 DIAGNOSIS — E559 Vitamin D deficiency, unspecified: Secondary | ICD-10-CM

## 2024-04-17 DIAGNOSIS — R790 Abnormal level of blood mineral: Secondary | ICD-10-CM

## 2024-04-17 DIAGNOSIS — E672 Megavitamin-B6 syndrome: Secondary | ICD-10-CM

## 2024-04-20 ENCOUNTER — Ambulatory Visit (HOSPITAL_COMMUNITY)
Admission: RE | Admit: 2024-04-20 | Discharge: 2024-04-20 | Disposition: A | Discharge status: Home / Self Care | Payer: Self-pay | Source: Ambulatory Visit | Attending: Primary Care | Admitting: Primary Care

## 2024-04-20 DIAGNOSIS — R0609 Other forms of dyspnea: Secondary | ICD-10-CM | POA: Insufficient documentation

## 2024-04-20 DIAGNOSIS — J4540 Moderate persistent asthma, uncomplicated: Secondary | ICD-10-CM | POA: Insufficient documentation

## 2024-04-20 LAB — PULMONARY FUNCTION TEST
FEF 25-75 Post: 2.88 L/s
FEF 25-75 Pre: 2.68 L/s
FEF2575-%Change-Post: 7 %
FEF2575-%Pred-Post: 107 %
FEF2575-%Pred-Pre: 100 %
FEV1-%Change-Post: 2 %
FEV1-%Pred-Post: 92 %
FEV1-%Pred-Pre: 89 %
FEV1-Post: 2.88 L
FEV1-Pre: 2.81 L
FEV1FVC-%Change-Post: 2 %
FEV1FVC-%Pred-Pre: 101 %
FEV6-%Change-Post: 0 %
FEV6-%Pred-Post: 90 %
FEV6-%Pred-Pre: 90 %
FEV6-Post: 3.55 L
FEV6-Pre: 3.54 L
FEV6FVC-%Change-Post: 0 %
FEV6FVC-%Pred-Post: 103 %
FEV6FVC-%Pred-Pre: 103 %
FVC-%Change-Post: 0 %
FVC-%Pred-Post: 87 %
FVC-%Pred-Pre: 87 %
FVC-Post: 3.55 L
FVC-Pre: 3.54 L
Post FEV1/FVC ratio: 81 %
Post FEV6/FVC ratio: 100 %
Pre FEV1/FVC ratio: 79 %
Pre FEV6/FVC Ratio: 100 %

## 2024-04-20 MED ORDER — METHACHOLINE 4 MG/ML NEB SOLN
3.0000 mL | Freq: Once | RESPIRATORY_TRACT | Status: AC
  Administered 2024-04-20: 12 mg via RESPIRATORY_TRACT
  Filled 2024-04-20: qty 3

## 2024-04-20 MED ORDER — METHACHOLINE 0.25 MG/ML NEB SOLN
3.0000 mL | Freq: Once | RESPIRATORY_TRACT | Status: AC
  Administered 2024-04-20: 0.75 mg via RESPIRATORY_TRACT
  Filled 2024-04-20: qty 3

## 2024-04-20 MED ORDER — METHACHOLINE 0.0625 MG/ML NEB SOLN
3.0000 mL | Freq: Once | RESPIRATORY_TRACT | Status: AC
  Administered 2024-04-20: 0.1875 mg via RESPIRATORY_TRACT
  Filled 2024-04-20: qty 3

## 2024-04-20 MED ORDER — ALBUTEROL SULFATE (2.5 MG/3ML) 0.083% IN NEBU
2.5000 mg | INHALATION_SOLUTION | Freq: Once | RESPIRATORY_TRACT | Status: AC
  Administered 2024-04-20: 2.5 mg via RESPIRATORY_TRACT

## 2024-04-20 MED ORDER — METHACHOLINE 0 MG/ML NEB SOLN
3.0000 mL | Freq: Once | RESPIRATORY_TRACT | Status: AC
  Administered 2024-04-20: 3 mL via RESPIRATORY_TRACT
  Filled 2024-04-20: qty 3

## 2024-04-20 MED ORDER — METHACHOLINE 16 MG/ML NEB SOLN
3.0000 mL | Freq: Once | RESPIRATORY_TRACT | Status: AC
  Administered 2024-04-20: 48 mg via RESPIRATORY_TRACT
  Filled 2024-04-20: qty 3

## 2024-04-20 MED ORDER — METHACHOLINE 1 MG/ML NEB SOLN
3.0000 mL | Freq: Once | RESPIRATORY_TRACT | Status: AC
  Administered 2024-04-20: 3 mg via RESPIRATORY_TRACT
  Filled 2024-04-20: qty 3

## 2024-04-22 ENCOUNTER — Other Ambulatory Visit: Payer: Self-pay | Admitting: Family Medicine

## 2024-04-22 DIAGNOSIS — M797 Fibromyalgia: Secondary | ICD-10-CM

## 2024-04-23 ENCOUNTER — Other Ambulatory Visit: Payer: Self-pay

## 2024-04-23 DIAGNOSIS — E1169 Type 2 diabetes mellitus with other specified complication: Secondary | ICD-10-CM

## 2024-04-23 DIAGNOSIS — E672 Megavitamin-B6 syndrome: Secondary | ICD-10-CM

## 2024-04-23 DIAGNOSIS — E559 Vitamin D deficiency, unspecified: Secondary | ICD-10-CM | POA: Diagnosis not present

## 2024-04-23 DIAGNOSIS — R790 Abnormal level of blood mineral: Secondary | ICD-10-CM

## 2024-04-23 LAB — COMPREHENSIVE METABOLIC PANEL WITH GFR
ALT: 16 U/L (ref 0–35)
AST: 22 U/L (ref 0–37)
Albumin: 4.2 g/dL (ref 3.5–5.2)
Alkaline Phosphatase: 63 U/L (ref 39–117)
BUN: 21 mg/dL (ref 6–23)
CO2: 28 meq/L (ref 19–32)
Calcium: 9.8 mg/dL (ref 8.4–10.5)
Chloride: 104 meq/L (ref 96–112)
Creatinine, Ser: 1 mg/dL (ref 0.40–1.20)
GFR: 60.87 mL/min (ref >60.00)
Glucose, Bld: 81 mg/dL (ref 70–99)
Potassium: 4.5 meq/L (ref 3.5–5.1)
Sodium: 141 meq/L (ref 135–145)
Total Bilirubin: 0.4 mg/dL (ref 0.2–1.2)
Total Protein: 7 g/dL (ref 6.0–8.3)

## 2024-04-23 LAB — LIPID PANEL
Cholesterol: 197 mg/dL (ref 0–200)
HDL: 89.9 mg/dL (ref >39.00)
LDL Cholesterol: 95 mg/dL (ref 0–99)
NonHDL: 107.16
Total CHOL/HDL Ratio: 2
Triglycerides: 59 mg/dL (ref 0.0–149.0)
VLDL: 11.8 mg/dL (ref 0.0–40.0)

## 2024-04-23 LAB — VITAMIN D 25 HYDROXY (VIT D DEFICIENCY, FRACTURES): VITD: 36.56 ng/mL (ref 30.00–100.00)

## 2024-04-23 LAB — FERRITIN: Ferritin: 39.3 ng/mL (ref 10.0–291.0)

## 2024-04-23 LAB — IBC PANEL
Iron: 117 ug/dL (ref 42–145)
Saturation Ratios: 40.8 % (ref 20.0–50.0)
TIBC: 287 ug/dL (ref 250.0–450.0)
Transferrin: 205 mg/dL — ABNORMAL LOW (ref 212.0–360.0)

## 2024-04-23 LAB — HEMOGLOBIN A1C: Hgb A1c MFr Bld: 6 % (ref 4.6–6.5)

## 2024-04-23 NOTE — Telephone Encounter (Signed)
 Name of Medication:  Tramadol  Name of Pharmacy:  [facility] Last Fill or Written Date and Quantity:  12/30/23, #20 Last Office Visit and Type:  02/25/24, mood f/u Next Office Visit and Type:  04/30/24, CPE Last Controlled Substance Agreement Date:  07/08/14 Last UDS:  07/08/14

## 2024-04-23 NOTE — Telephone Encounter (Signed)
 ERx

## 2024-04-24 ENCOUNTER — Ambulatory Visit: Payer: Self-pay | Admitting: Family Medicine

## 2024-04-27 ENCOUNTER — Other Ambulatory Visit: Payer: Self-pay

## 2024-04-27 ENCOUNTER — Encounter: Payer: Self-pay | Admitting: Family Medicine

## 2024-04-27 DIAGNOSIS — E1169 Type 2 diabetes mellitus with other specified complication: Secondary | ICD-10-CM

## 2024-04-28 ENCOUNTER — Other Ambulatory Visit (HOSPITAL_COMMUNITY): Payer: Self-pay

## 2024-04-28 ENCOUNTER — Telehealth: Payer: Self-pay

## 2024-04-28 LAB — VITAMIN B6: Vitamin B6: 12.6 ng/mL (ref 2.1–21.7)

## 2024-04-28 NOTE — Telephone Encounter (Signed)
 Pharmacy Patient Advocate Encounter  Received notification from [facility] that Prior Authorization for Trulicity  1.5 has been APPROVED from 04/28/24 to 04/28/25. Ran test claim, Copay is $45.00. This test claim was processed through [facility]- copay amounts may vary at other pharmacies due to pharmacy/plan contracts, or as the patient moves through the different stages of their insurance plan.     PA #/Case ID/Reference #: # [id number]

## 2024-04-28 NOTE — Telephone Encounter (Signed)
 Pharmacy Patient Advocate Encounter   Received notification from Pt Calls Messages that prior authorization for Trulicity  1.5 is required/requested.   Insurance verification completed.   The patient is insured through [facility] .   Per test claim: PA required; PA submitted to above mentioned insurance via Latent Key/confirmation #/EOC [id number] Status is pending

## 2024-04-29 ENCOUNTER — Other Ambulatory Visit (HOSPITAL_COMMUNITY): Payer: Self-pay

## 2024-04-30 ENCOUNTER — Encounter: Payer: Self-pay | Admitting: Family Medicine

## 2024-04-30 ENCOUNTER — Ambulatory Visit: Admitting: Family Medicine

## 2024-04-30 VITALS — BP 122/68 | HR 106 | Temp 97.9°F | Ht 69.0 in | Wt 182.0 lb

## 2024-04-30 DIAGNOSIS — Z0000 Encounter for general adult medical examination without abnormal findings: Secondary | ICD-10-CM

## 2024-04-30 DIAGNOSIS — F325 Major depressive disorder, single episode, in full remission: Secondary | ICD-10-CM | POA: Diagnosis not present

## 2024-04-30 DIAGNOSIS — F411 Generalized anxiety disorder: Secondary | ICD-10-CM

## 2024-04-30 DIAGNOSIS — K5900 Constipation, unspecified: Secondary | ICD-10-CM

## 2024-04-30 DIAGNOSIS — E1169 Type 2 diabetes mellitus with other specified complication: Secondary | ICD-10-CM

## 2024-04-30 DIAGNOSIS — Z1211 Encounter for screening for malignant neoplasm of colon: Secondary | ICD-10-CM

## 2024-04-30 DIAGNOSIS — K219 Gastro-esophageal reflux disease without esophagitis: Secondary | ICD-10-CM | POA: Diagnosis not present

## 2024-04-30 DIAGNOSIS — M3509 Sicca syndrome with other organ involvement: Secondary | ICD-10-CM

## 2024-04-30 DIAGNOSIS — M797 Fibromyalgia: Secondary | ICD-10-CM

## 2024-04-30 DIAGNOSIS — E063 Autoimmune thyroiditis: Secondary | ICD-10-CM

## 2024-04-30 DIAGNOSIS — M85851 Other specified disorders of bone density and structure, right thigh: Secondary | ICD-10-CM | POA: Diagnosis not present

## 2024-04-30 DIAGNOSIS — Z23 Encounter for immunization: Secondary | ICD-10-CM

## 2024-04-30 DIAGNOSIS — C44321 Squamous cell carcinoma of skin of nose: Secondary | ICD-10-CM

## 2024-04-30 DIAGNOSIS — R5381 Other malaise: Secondary | ICD-10-CM | POA: Diagnosis not present

## 2024-04-30 DIAGNOSIS — R0609 Other forms of dyspnea: Secondary | ICD-10-CM

## 2024-04-30 DIAGNOSIS — G6289 Other specified polyneuropathies: Secondary | ICD-10-CM

## 2024-04-30 DIAGNOSIS — G4700 Insomnia, unspecified: Secondary | ICD-10-CM

## 2024-04-30 DIAGNOSIS — N289 Disorder of kidney and ureter, unspecified: Secondary | ICD-10-CM

## 2024-04-30 DIAGNOSIS — Z7985 Long-term (current) use of injectable non-insulin antidiabetic drugs: Secondary | ICD-10-CM

## 2024-04-30 DIAGNOSIS — E559 Vitamin D deficiency, unspecified: Secondary | ICD-10-CM

## 2024-04-30 DIAGNOSIS — E672 Megavitamin-B6 syndrome: Secondary | ICD-10-CM

## 2024-04-30 DIAGNOSIS — R790 Abnormal level of blood mineral: Secondary | ICD-10-CM

## 2024-04-30 DIAGNOSIS — E538 Deficiency of other specified B group vitamins: Secondary | ICD-10-CM

## 2024-04-30 DIAGNOSIS — M62838 Other muscle spasm: Secondary | ICD-10-CM

## 2024-04-30 MED ORDER — METHOCARBAMOL 500 MG PO TABS: 500.0000 mg | ORAL_TABLET | Freq: Two times a day (BID) | ORAL | 0 refills | Status: DC | PRN

## 2024-04-30 MED ORDER — VITAMIN D 25 MCG (1000 UNIT) PO TABS: 2000.0000 [IU] | ORAL_TABLET | Freq: Every day | ORAL | Status: AC

## 2024-04-30 MED ORDER — DOXEPIN HCL 3 MG PO TABS: 1.0000 | ORAL_TABLET | Freq: Every evening | ORAL | 3 refills | Status: DC

## 2024-04-30 NOTE — Patient Instructions (Addendum)
 Flu shot today  Prevnar-20 today We will sign you up for Cologuard.  Try doxepin  (Silenor ) 3mg  for sleep Ok to continue pepcid  20mg  daily.  Increase vitamin D  to 2000 units daily  Schedule GYN exam/appt.  With trulicity , work on protein intake and strength training Good to see you today  Return as needed or in 4-6 months for follow up visit.

## 2024-04-30 NOTE — Assessment & Plan Note (Addendum)
 Preventative protocols reviewed and updated unless pt declined. Discussed healthy diet and lifestyle.  Ordered Cologuard Encouraged she schedule GYN well woman exam and mammogram

## 2024-04-30 NOTE — Progress Notes (Signed)
 Ph: [phone] Fax: [phone]   Patient ID: [name], female    DOB: [birth year], 63 y.o.   MRN: [id number]  This visit was conducted in person.  BP 122/68   Pulse (!) 106   Temp 97.9 F (36.6 C) (Oral)   Ht 5' 9 (1.753 m)   Wt 182 lb (82.6 kg)   LMP 06/27/2019   SpO2 98%   BMI 26.88 kg/m    CC: CPE Subjective:   HPI: [name] is a 63 y.o. female presenting on 04/30/2024 for Annual Exam   See prior note for details.  Last visit xanax  dose increased. Trouble tolerating daily mood medications previously tried.   Worsening exertional dyspnea ie working in the yard. Saw pulm - s/p overall reassuring methylcholine challenge test /PFTs - seeing Dr Annella next week. She started singulair  and Symbicort  - didn't note significant benefit with this so she stopped taking them.   On Trulicity  for last few years for diabetes control. Lab Results  Component Value Date   HGBA1C 6.0 04/23/2024    Preventative: Colonoscopy 06/2013 - 1 benign polyp, rpt 10 yrs ([facility]).  Discussed options, she agrees to cologuard  Well woman exam - through OBGYN Dr Latisha - overdue Mammogram 06/2022 - [facility]. Routinely gets mammo with GYN.  DEXA scan 06/2020 through GYN - T -1.3 R femoral neck  Lung cancer screen - not eligible  Flu shot - yearly COVID vaccine - did not receive Tdap 04/2016 Prevnar-20 - today Shingrix  - 06/2022, 09/2022 Advanced directive discussion - not discussed Seat belt use discussed Sunscreen use discussed. 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 Interim medical history since our last visit reviewed. Allergies and medications reviewed and updated. Outpatient Medications Prior to Visit  Medication Sig Dispense Refill   albuterol  (VENTOLIN  HFA) 108 (90 Base) MCG/ACT inhaler Inhale 2 puffs into the lungs every 6 (six) hours as needed for wheezing or shortness of breath. 8 g 2   ALPRAZolam  (XANAX ) 0.25 MG tablet TAKE 1 TABLET BY MOUTH 2 TIMES DAILY AS NEEDED FOR ANXIETY. 50 tablet 0   ARMOUR THYROID  60 MG tablet TAKE 1 TABLET BY MOUTH DAILY BEFORE BREAKFAST 90 tablet 3   CALCIUM-MAGNESIUM-ZINC PO Take by mouth daily.     cetirizine (ZYRTEC) 10 MG tablet Take 10 mg by mouth at bedtime.     Coenzyme Q10 100 MG capsule Take 1 capsule (100 mg total) by mouth daily.     Dulaglutide  (TRULICITY ) 1.5 MG/0.5ML SOAJ INJECT 1.5MG  SUBCUTANEOUSLY ONCE WEEKLY 2 mL 6   famotidine  (PEPCID ) 20 MG tablet Take 1 tablet (20 mg total) by mouth daily as needed for heartburn or indigestion.     fluticasone  (FLONASE ) 50 MCG/ACT nasal spray Place into the nose. (Patient taking differently: Place into the nose as needed.)     gabapentin  (NEURONTIN ) 300 MG capsule Take 1 capsule (300 mg  total) by mouth 2 (two) times daily. 180 capsule 1   hydroxychloroquine  (PLAQUENIL ) 200 MG tablet Take 1 tablet (200 mg total) by mouth 2 (two) times daily. 180 tablet 0   IVERMECTIN EX Apply topically. Gel     Loteprednol Etabonate (EYSUVIS OP) Apply to eye as needed.     nystatin  ointment (MYCOSTATIN ) Apply 1 application topically 2 (two) times daily. (Patient taking differently: Apply 1 application  topically as needed.) 30 g 0   OVER THE COUNTER MEDICATION LUNA and GABA supplements     traMADol  (ULTRAM ) 50 MG tablet Take 1 tablet (50 mg total) by mouth every 12 (twelve) hours  as needed. 20 tablet 0   TURMERIC PO Take by mouth.     cholecalciferol (VITAMIN D3) 25 MCG (1000 UNIT) tablet Take 1,000 Units by mouth daily.     methocarbamol  (ROBAXIN ) 500 MG tablet Take 1 tablet (500 mg total) by mouth 2 (two) times daily as needed for muscle spasms. 60 tablet 1   budesonide -formoterol  (SYMBICORT ) 160-4.5 MCG/ACT inhaler Inhale 2 puffs into the lungs in the morning and at bedtime. 1 each 5   doxycycline  (VIBRAMYCIN ) 100 MG capsule Take 100 mg by mouth 2 (two) times daily.     fluconazole  (DIFLUCAN ) 100 MG tablet Take 100 mg by mouth daily.     fluorouracil (EFUDEX) 5 % cream Apply topically 2 (two) times daily.     montelukast  (SINGULAIR ) 10 MG tablet TAKE 1 TABLET BY MOUTH EVERYDAY AT BEDTIME 30 tablet 11   No facility-administered medications prior to visit.     Per HPI unless specifically indicated in ROS section below Review of Systems  Constitutional:  Negative for activity change, appetite change, chills, fatigue, fever and unexpected weight change.  HENT:  Negative for hearing loss.   Eyes:  Negative for visual disturbance.  Respiratory:  Positive for chest tightness and shortness of breath (exertional). Negative for cough and wheezing.   Cardiovascular:  Positive for palpitations. Negative for chest pain and leg swelling.  Gastrointestinal:  Positive for constipation. Negative for abdominal distention, abdominal pain, blood in stool, diarrhea, nausea and vomiting.  Genitourinary:  Negative for difficulty urinating and hematuria.  Musculoskeletal:  Negative for arthralgias, myalgias and neck pain.  Skin:  Negative for rash.  Neurological:  Positive for dizziness, light-headedness and headaches. Negative for seizures and syncope.  Hematological:  Negative for adenopathy. Does not bruise/bleed easily.  Psychiatric/Behavioral:  Positive for dysphoric mood. The patient is nervous/anxious.     Objective:  BP 122/68   Pulse (!) 106   Temp 97.9 F (36.6 C)  (Oral)   Ht 5' 9 (1.753 m)   Wt 182 lb (82.6 kg)   LMP 06/27/2019   SpO2 98%   BMI 26.88 kg/m   Wt Readings from Last 3 Encounters:  04/30/24 182 lb (82.6 kg)  03/24/24 178 lb 6.4 oz (80.9 kg)  03/10/24 181 lb 6.4 oz (82.3 kg)    Ht Readings from Last 3 Encounters:  04/30/24 5' 9 (1.753 m)  03/24/24 5' 10 (1.778 m)  03/10/24 5' 10 (1.778 m)     Physical Exam Vitals and nursing note reviewed.  Constitutional:      Appearance: Normal appearance. She is not ill-appearing.  HENT:     Head: Normocephalic and atraumatic.     Right Ear: Tympanic membrane, ear canal and external ear normal. There is no impacted cerumen.     Left Ear: Tympanic membrane, ear canal and external ear normal. There is  no impacted cerumen.     Mouth/Throat:     Mouth: Mucous membranes are moist.     Pharynx: Oropharynx is clear. No oropharyngeal exudate or posterior oropharyngeal erythema.  Eyes:     General:        Right eye: No discharge.        Left eye: No discharge.     Extraocular Movements: Extraocular movements intact.     Conjunctiva/sclera: Conjunctivae normal.     Pupils: Pupils are equal, round, and reactive to light.  Neck:     Thyroid : No thyroid  mass or thyromegaly.  Cardiovascular:     Rate and Rhythm: Normal rate and regular rhythm.     Pulses: Normal pulses.     Heart sounds: Normal heart sounds. No murmur heard. Pulmonary:     Effort: Pulmonary effort is normal. No respiratory distress.     Breath sounds: Normal breath sounds. No wheezing, rhonchi or rales.  Abdominal:     General: Bowel sounds are normal. There is no distension.     Palpations: Abdomen is soft. There is no mass.     Tenderness: There is no abdominal tenderness. There is no guarding or rebound.     Hernia: No hernia is present.  Musculoskeletal:     Cervical back: Normal range of motion and neck supple. No rigidity.     Right lower leg: No edema.     Left lower leg: No edema.  Lymphadenopathy:      Cervical: No cervical adenopathy.  Skin:    General: Skin is warm and dry.     Findings: No rash.  Neurological:     General: No focal deficit present.     Mental Status: She is alert. Mental status is at baseline.     Comments:  Grip strength intact 5/5 strength BUE, thigh flexors  Psychiatric:        Mood and Affect: Mood normal.        Behavior: Behavior normal.       Results for orders placed or performed in visit on 04/23/24  VITAMIN D  25 Hydroxy (Vit-D Deficiency, Fractures)   Collection Time: 04/23/24  8:59 AM  Result Value Ref Range   VITD 36.56 30.00 - 100.00 ng/mL  Ferritin   Collection Time: 04/23/24  8:59 AM  Result Value Ref Range   Ferritin 39.3 10.0 - 291.0 ng/mL  IBC panel   Collection Time: 04/23/24  8:59 AM  Result Value Ref Range   Iron  117 42 - 145 ug/dL   Transferrin 794.9 (L) 212.0 - 360.0 mg/dL   Saturation Ratios 59.1 20.0 - 50.0 %   TIBC 287.0 250.0 - 450.0 mcg/dL  Vitamin B6   Collection Time: 04/23/24  8:59 AM  Result Value Ref Range   Vitamin B6 12.6 2.1 - 21.7 ng/mL  Hemoglobin A1c   Collection Time: 04/23/24  8:59 AM  Result Value Ref Range   Hgb A1c MFr Bld 6.0 4.6 - 6.5 %  Lipid panel   Collection Time: 04/23/24  8:59 AM  Result Value Ref Range   Cholesterol 197 0 - 200 mg/dL   Triglycerides 40.9 0.0 - 149.0 mg/dL   HDL 10.09 >60.99 mg/dL   VLDL 88.1 0.0 - 59.9 mg/dL   LDL Cholesterol 95 0 - 99 mg/dL   Total CHOL/HDL Ratio 2    NonHDL 107.16   Comprehensive metabolic panel with GFR   Collection Time: 04/23/24  8:59 AM  Result Value Ref Range   Sodium  141 135 - 145 mEq/L   Potassium 4.5 3.5 - 5.1 mEq/L   Chloride 104 96 - 112 mEq/L   CO2 28 19 - 32 mEq/L   Glucose, Bld 81 70 - 99 mg/dL   BUN 21 6 - 23 mg/dL   Creatinine, Ser 8.99 0.40 - 1.20 mg/dL   Total Bilirubin 0.4 0.2 - 1.2 mg/dL   Alkaline Phosphatase 63 39 - 117 U/L   AST 22 0 - 37 U/L   ALT 16 0 - 35 U/L   Total Protein 7.0 6.0 - 8.3 g/dL   Albumin 4.2 3.5 - 5.2  g/dL   GFR 39.12 >39.99 mL/min   Calcium 9.8 8.4 - 10.5 mg/dL   Lab Results  Component Value Date   VITAMINB12 819 12/31/2023       04/30/2024   12:14 PM 02/25/2024   11:07 AM 12/31/2023    2:00 PM 11/25/2023   12:17 PM 10/14/2023    2:07 PM  Depression screen PHQ 2/9  Decreased Interest 1 3 1 1 1   Down, Depressed, Hopeless 1 2 1 1 1   PHQ - 2 Score 2 5 2 2 2   Altered sleeping 2 2 0 1 1  Tired, decreased energy 3 3 3 1  0  Change in appetite 1 2 0 1 0  Feeling bad or failure about yourself  2 2 1 1 1   Trouble concentrating 2 1 2 1  0  Moving slowly or fidgety/restless 1 0 2 1 1   Suicidal thoughts 0 0 0 0 0  PHQ-9 Score 13 15 10 8 5   Difficult doing work/chores Very difficult Very difficult Somewhat difficult Somewhat difficult        04/30/2024   12:15 PM 02/25/2024   11:07 AM 12/31/2023    2:01 PM 11/25/2023   12:18 PM  GAD 7 : Generalized Anxiety Score  Nervous, Anxious, on Edge 2 3 1 2   Control/stop worrying 2 2 1 1   Worry too much - different things 2 1 2 1   Trouble relaxing 1 1 1 1   Restless 0 0 0 0  Easily annoyed or irritable 1 1 1 1   Afraid - awful might happen 3 3 2 2   Total GAD 7 Score 11 11 8 8   Anxiety Difficulty Very difficult Very difficult Somewhat difficult Somewhat difficult   Assessment & Plan:   Problem List Items Addressed This Visit     Encounter for general adult medical examination with abnormal findings - Primary (Chronic)   Preventative protocols reviewed and updated unless pt declined. Discussed healthy diet and lifestyle.  Ordered Cologuard Encouraged she schedule GYN well woman exam and mammogram       MDD (major depressive disorder), single episode, in full remission (HCC)   Previous trial of lexapro , cymbalta  not tolerated.  Desires to avoid medication that could contribute to weight gain.  Consider prozac, pristiq vs milnacipran or levomilnacipran vs bupropion  - as least likely to cause weight gain.       Hypothyroidism due to Hashimoto's  thyroiditis   Continue Armour Thyroid . Latest TFTs (12/2023) WNL.       Insomnia   Chronic issue, predominant sleep maintenance insomnia.  She notes she has been using xanax  regularly for sleep.  Will trial silenor  3mg  nightly for this and change xanax  PRN anxiety only.       Vitamin B12 deficiency   B12 levels stable off oral replacement.       Generalized anxiety disorder   Last visit we  increased xanax  with limited benefit. See below re: insomnia management.  Not on daily anxiety medication - consider prozac, pristiq vs milnacipran or levomilnacipran vs bupropion  - as least likely to cause weight gain.       Vitamin D  deficiency   Increase vitamin D  to 2000 units daily      Peripheral neuropathy   H/o length dependent sensory neuropathy.  Continue gabapentin  300mg  bid.       Relevant Medications   methocarbamol  (ROBAXIN ) 500 MG tablet   Doxepin  HCl 3 MG TABS   Fibromyalgia   Continue gabapentin  300mg  bid.  Cymbalta  led to apathy, weight gain.       Relevant Medications   methocarbamol  (ROBAXIN ) 500 MG tablet   Type 2 diabetes mellitus with other specified complication (HCC)   Chronic, stable period on Trulicity  1.5mg  weekly.  Reviewed importance of strength training and protein intake to maintain muscle mass in trulicity  use. See below.       Sjogren's disease [facility])   Sees rheumatology regularly, on plaquenil .       Dyspnea on exertion   Chronic , progression noted over the past 5 years with marked exercise intolerance. Has had unrevealing pulm and cards workup, currently seeing pulm again.  Latest PFTs reassuring 03/2024.  Didn't note benefit with singulair  or symbicort .  Will await pulm eval.       Gastroesophageal reflux disease   Notes worsening since estopping pantoprazole  Pantoprazole  was stopped due to development of CKD  Discussed daily pepcid  use.  H/o EoE, improved on latest EGD 2023      Low iron  stores   Ferritin continues improving.  Low  transferring suggesting chronic disease component.  Continue oral iron  use cautiously.       Malaise and fatigue   Chronic, longstanding.  Notes some muscle weakness as well, reassuring strength testing today.  Discussed GLP1RA use and possible muscle mass loss due to this - suggested decreasing/discontinuing Trulicity , she prefers to renew efforts to ensure good protein intake and incorporate strength training. Will continue to monitor this.       Constipation   Worse with trulicity .  Caution with oral iron  use.       Squamous cell cancer of skin of nose   Continue derm f/u.       Renal insufficiency   Latest kidney function with GFR 60, reassuring.  Seemed to improve once she stopped regular PPI use.       Hypervitaminosis B6   Levels have normalized off B-complex multivitamin      Osteopenia   Continue regular calcium and vitamin D  intake as well as regular weight bearing exercise. She will increase vitamin D  to 2000 units daily.  She will be due for repeat DEXA 06/2015 - gets this through GYN.       Other Visit Diagnoses       Need for influenza vaccination       Relevant Orders   Flu vaccine trivalent PF, 6mos and older(Flulaval,Afluria,Fluarix,Fluzone) (Completed)     Special screening for malignant neoplasms, colon       Relevant Orders   Cologuard     Muscle spasm       Relevant Medications   methocarbamol  (ROBAXIN ) 500 MG tablet     Need for vaccination against Streptococcus pneumoniae       Relevant Orders   Pneumococcal conjugate vaccine 20-valent (Prevnar 20) (Completed)     Long-term (current) use of injectable non-insulin antidiabetic drugs  Meds ordered this encounter  Medications   cholecalciferol (VITAMIN D3) 25 MCG (1000 UNIT) tablet    Sig: Take 2 tablets (2,000 Units total) by mouth daily.   methocarbamol  (ROBAXIN ) 500 MG tablet    Sig: Take 1 tablet (500 mg total) by mouth 2 (two) times daily as needed for muscle spasms.     Dispense:  60 tablet    Refill:  0   Doxepin  HCl 3 MG TABS    Sig: Take 1 tablet (3 mg total) by mouth at bedtime.    Dispense:  30 tablet    Refill:  3    Orders Placed This Encounter  Procedures   Flu vaccine trivalent PF, 6mos and older(Flulaval,Afluria,Fluarix,Fluzone)   Pneumococcal conjugate vaccine 20-valent (Prevnar 20)   Cologuard    Patient Instructions  Flu shot today  Prevnar-20 today We will sign you up for Cologuard.  Try doxepin  (Silenor ) 3mg  for sleep Ok to continue pepcid  20mg  daily.  Increase vitamin D  to 2000 units daily  Schedule GYN exam/appt.  With trulicity , work on protein intake and strength training Good to see you today  Return as needed or in 4-6 months for follow up visit.   Follow up plan: Return in about 4 months (around 08/30/2024) for follow up visit.  Anton Blas, MD

## 2024-05-01 DIAGNOSIS — M8580 Other specified disorders of bone density and structure, unspecified site: Secondary | ICD-10-CM | POA: Insufficient documentation

## 2024-05-01 NOTE — Assessment & Plan Note (Addendum)
 H/o length dependent sensory neuropathy.  Continue gabapentin  300mg  bid.

## 2024-05-01 NOTE — Assessment & Plan Note (Signed)
 Levels have normalized off B-complex multivitamin

## 2024-05-01 NOTE — Assessment & Plan Note (Addendum)
 Continue regular calcium and vitamin D  intake as well as regular weight bearing exercise. She will increase vitamin D  to 2000 units daily.  She will be due for repeat DEXA 06/2015 - gets this through GYN.

## 2024-05-01 NOTE — Assessment & Plan Note (Addendum)
 Chronic, longstanding.  Notes some muscle weakness as well, reassuring strength testing today.  Discussed GLP1RA use and possible muscle mass loss due to this - suggested decreasing/discontinuing Trulicity , she prefers to renew efforts to ensure good protein intake and incorporate strength training. Will continue to monitor this.

## 2024-05-01 NOTE — Assessment & Plan Note (Signed)
 Previous trial of lexapro , cymbalta  not tolerated.  Desires to avoid medication that could contribute to weight gain.  Consider prozac, pristiq vs milnacipran or levomilnacipran vs bupropion  - as least likely to cause weight gain.

## 2024-05-01 NOTE — Assessment & Plan Note (Signed)
 Continue derm f/u.

## 2024-05-01 NOTE — Assessment & Plan Note (Signed)
 Ferritin continues improving.  Low transferring suggesting chronic disease component.  Continue oral iron  use cautiously.

## 2024-05-01 NOTE — Assessment & Plan Note (Signed)
 Chronic issue, predominant sleep maintenance insomnia.  She notes she has been using xanax  regularly for sleep.  Will trial silenor  3mg  nightly for this and change xanax  PRN anxiety only.

## 2024-05-01 NOTE — Assessment & Plan Note (Signed)
 Continue gabapentin  300mg  bid.  Cymbalta  led to apathy, weight gain.

## 2024-05-01 NOTE — Assessment & Plan Note (Signed)
 Increase vitamin D to 2000 units daily

## 2024-05-01 NOTE — Assessment & Plan Note (Signed)
 Last visit we increased xanax  with limited benefit. See below re: insomnia management.  Not on daily anxiety medication - consider prozac, pristiq vs milnacipran or levomilnacipran vs bupropion  - as least likely to cause weight gain.

## 2024-05-01 NOTE — Assessment & Plan Note (Signed)
 Continue Armour Thyroid . Latest TFTs (12/2023) WNL.

## 2024-05-01 NOTE — Assessment & Plan Note (Signed)
 Sees rheumatology regularly, on plaquenil .

## 2024-05-01 NOTE — Assessment & Plan Note (Addendum)
 Chronic , progression noted over the past 5 years with marked exercise intolerance. Has had unrevealing pulm and cards workup, currently seeing pulm again.  Latest PFTs reassuring 03/2024.  Didn't note benefit with singulair  or symbicort .  Will await pulm eval.

## 2024-05-01 NOTE — Assessment & Plan Note (Signed)
 Chronic, stable period on Trulicity  1.5mg  weekly.  Reviewed importance of strength training and protein intake to maintain muscle mass in trulicity  use. See below.

## 2024-05-01 NOTE — Assessment & Plan Note (Signed)
 Worse with trulicity .  Caution with oral iron  use.

## 2024-05-01 NOTE — Assessment & Plan Note (Signed)
 B12 levels stable off oral replacement.

## 2024-05-01 NOTE — Assessment & Plan Note (Signed)
 Latest kidney function with GFR 60, reassuring.  Seemed to improve once she stopped regular PPI use.

## 2024-05-01 NOTE — Assessment & Plan Note (Addendum)
 Notes worsening since estopping pantoprazole  Pantoprazole  was stopped due to development of CKD  Discussed daily pepcid  use.  H/o EoE, improved on latest EGD 2023

## 2024-05-03 ENCOUNTER — Encounter: Payer: Self-pay | Admitting: Family Medicine

## 2024-05-03 DIAGNOSIS — L905 Scar conditions and fibrosis of skin: Secondary | ICD-10-CM | POA: Diagnosis not present

## 2024-05-03 DIAGNOSIS — Z86007 Personal history of in-situ neoplasm of skin: Secondary | ICD-10-CM | POA: Diagnosis not present

## 2024-05-03 DIAGNOSIS — Z08 Encounter for follow-up examination after completed treatment for malignant neoplasm: Secondary | ICD-10-CM | POA: Diagnosis not present

## 2024-05-08 ENCOUNTER — Other Ambulatory Visit: Payer: Self-pay | Admitting: Family Medicine

## 2024-05-08 DIAGNOSIS — F325 Major depressive disorder, single episode, in full remission: Secondary | ICD-10-CM

## 2024-05-10 ENCOUNTER — Ambulatory Visit: Payer: Self-pay | Admitting: Pulmonary Disease

## 2024-05-10 ENCOUNTER — Encounter: Payer: Self-pay | Admitting: Pulmonary Disease

## 2024-05-10 VITALS — BP 112/80 | HR 107 | Temp 98.3°F | Ht 69.0 in | Wt 183.0 lb

## 2024-05-10 DIAGNOSIS — Z87891 Personal history of nicotine dependence: Secondary | ICD-10-CM | POA: Diagnosis not present

## 2024-05-10 DIAGNOSIS — R0609 Other forms of dyspnea: Secondary | ICD-10-CM | POA: Diagnosis not present

## 2024-05-10 NOTE — Telephone Encounter (Signed)
 ERx

## 2024-05-10 NOTE — Progress Notes (Signed)
 @Patient  ID: [name], female    DOB: 11-27-1960, 63 y.o.   MRN: [id number]  Chief Complaint  Patient presents with   Shortness of Breath    Breathing has gotten worse and medication are not helping. Pft result.    Referring provider: Rilla Baller, MD  HPI:   63 y.o. 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  Last seen by me in 2023.  Had a lot going on with no real improvement with therapies targeted towards asthma given her mosaicism otherwise normal PFTs, she decided to focus on other issues.  Recently reengaged with pulmonary medicine, most recent pulmonary note via NP reviewed.  Methacholine  challenge test was ordered.  This was reviewed in detail with the patient.  This is negative or normal methacholine  challenge test.  In fact her numbers are pretty consistent, there is some up-and-down but overall the values that started the test and at highest concentrations are about the same.  No significant decrease.  She was started back on inhalers in the interim and there was no improvement.  She subsequently stopped them.  We discussed at length the etiology of her shortness of breath.  She had a repeat CT scan with contrast that is without clear cause of her shortness of breath, scattered stable nodules noted.  She had a CPET in the interim that was largely normal.  Her echocardiogram in 2023 was essentially normal with some mild valvular abnormalities.  She had a CTA that showed no PE in 2023.  She has no upper extremity swelling, no asymmetric swelling, no arm weakness, no facial or neck swelling etc. to suggest thoracic outlet syndrome with some abnormal vasculature noted on CT scan that could be incidental.  We discussed with her mosaicism and negative methacholine  challenge, the  other etiology of mosaicism on images is pulmonary hypertension.  We discussed further pursuing this with minimally invasive procedure right heart catheterization.  She is eager to pursue this.  HPI at initial visit: She has in 2015 she had similar episode of dyspnea on exertion.  Notable on going up and down hills.  In [place] area.  This lasted for a few months but seem to resolve on its own.  Do not recall any intervention that make things better or worse.  She was in usual state of health until the late 2020.  Got COVID.  Since then has had a chronic cough.  The episodes or spasms of cough have gradually decreased in terms of frequency and severity.  However she is A chronic cough ever since then.  Over last 2 years she is developed dyspnea on exertion.  Gradually worsening.  Had been work in the yard for hours at a time without issue.  Now, do about 15 minutes before stopping and resting.  In addition she has some chest discomfort.  Substernal.  Side of her chest.  Not worse with exertion.  Occurs at rest.  Comes and goes.  She endorses longstanding history of reflux.  Heartburn is improved but she continues to have reflux regurgitation symptoms quite frequently.  Associated dry raspy voice.  Cough continues despite PPI therapy.  In addition, she has postnasal drip.  Seasonal allergies.  Does not often have rhinorrhea more dripped on the back of her throat.  Cough is productive of sputum.  She  denies any history of asthma.    She had recent chest x-ray 12/2021 and on my review interpretation is clear.  Borderline hyperinflated but cannot quite say so.  She has a history of eosinophilic esophagitis for which she follows with GI in addition to the reflux issues.  Recently diagnosed with Sjogren's within the last year or so.  PMH: Prediabetes, seasonal allergies, neuropathy, Hashimoto's thyroiditis, Sjogren's syndrome Surgical history: Breast augmentation Family history: First-degree relatives with  emphysema, asthma Social history: Never smoker, lives in [place] , self-employed   Questionaires / Pulmonary Flowsheets:   ACT:      No data to display          MMRC:     No data to display          Epworth:      No data to display          Tests:   FENO:  No results found for: NITRICOXIDE  PFT:    Latest Ref Rng & Units 04/20/2024    9:25 AM 03/21/2022    9:46 AM  PFT Results  FVC-Pre L 3.54  3.37   FVC-Predicted Pre % 87  82   FVC-Post L 3.55  3.39   FVC-Predicted Post % 87  82   Pre FEV1/FVC % % 79  80   Post FEV1/FCV % % 81  83   FEV1-Pre L 2.81  2.70   FEV1-Predicted Pre % 89  84   FEV1-Post L 2.88  2.83   DLCO uncorrected ml/min/mmHg  21.29   DLCO UNC% %  86   DLCO corrected ml/min/mmHg  21.29   DLCO COR %Predicted %  86   DLVA Predicted %  107   TLC L  5.83   TLC % Predicted %  97   RV % Predicted %  87     WALK:      No data to display          Imaging: Personally reviewed and as per EMR discussion in this note No results found.  Lab Results: Personally reviewed, notably eosinophils elevated CBC    Component Value Date/Time   WBC 5.3 12/17/2023 1049   RBC 4.12 12/17/2023 1049   HGB 12.5 12/17/2023 1049   HCT 37.4 12/17/2023 1049   PLT 256 12/17/2023 1049   MCV 90.8 12/17/2023 1049   MCH 30.3 12/17/2023 1049   MCHC 33.4 12/17/2023 1049   RDW 13.4 12/17/2023 1049   LYMPHSABS 1.5 08/26/2023 1014   MONOABS 0.6 08/26/2023 1014   EOSABS 461 12/17/2023 1049   BASOSABS 69 12/17/2023 1049    BMET    Component Value Date/Time   NA 141 04/23/2024 0859   K 4.5 04/23/2024 0859   CL 104 04/23/2024 0859   CO2 28 04/23/2024 0859   GLUCOSE 81 04/23/2024 0859   BUN 21 04/23/2024 0859   CREATININE 1.00 04/23/2024 0859   CREATININE 1.15 (H) 12/17/2023 1049   CALCIUM 9.8 04/23/2024 0859   GFRNONAA 64 11/08/2020 1215   GFRAA 74 11/08/2020 1215    BNP No results found for: BNP  ProBNP No results found  for: PROBNP  Specialty Problems       Pulmonary Problems   Chronic cough   Dyspnea on exertion   Underwent cardiac/pulmonary evaluation 2023 Echocardiogram 03/2022 WNL High-res CT lungs negative for ILD, ?small airway disease PFTs WNL Cardiopulmonary stress test (CPX) 04/2022 - normal functional capacity, no indication of exercise induced bronchospasm.  No improvement  with inhalers      Pulmonary nodule, right   RLL on CT 02/2022 All nodules stable on repeat imaging 01/2024 consistent with benign etiology       No Known Allergies  Immunization History  Administered Date(s) Administered   Influenza, Seasonal, Injecte, Preservative Fre 04/30/2024   Influenza,inj,Quad PF,6+ Mos 08/10/2014, 08/14/2015, 05/08/2016, 06/23/2017, 09/14/2018, 06/01/2019, 06/04/2021, 06/19/2022   PNEUMOCOCCAL CONJUGATE-20 04/30/2024   Tdap 05/01/2016   Zoster Recombinant(Shingrix ) 07/23/2022, 10/08/2022    Past Medical History:  Diagnosis Date   Allergic rhinitis    Allergy    Seasonal, especially Autumn   Anxiety    Arthritis    Cervical dysplasia 1990   s/p conization   Chronic sinusitis    Depression    Fibromyalgia    GERD (gastroesophageal reflux disease)    Hashimoto's disease    Hiatal hernia    Hip fracture, left (HCC) 2005   s/p ORIF   Hypothyroidism    Migraine    Neuropathy    Rosacea    dx by derm, per patient   Schatzki's ring    Sjogren syndrome, unspecified (HCC)    Skin cancer    Basal Cell and Squamous Cell   Spider bite 03/19/2024    Tobacco History: Social History   Tobacco Use  Smoking Status Former   Current packs/day: 0.00   Types: Cigarettes   Quit date: 1983   Years since quitting: 42.7   Passive exposure: Never  Smokeless Tobacco Never  Tobacco Comments   Pt states she smoked in high school and college. 01/23/22 ALS    Counseling given: Not Answered Tobacco comments: Pt states she smoked in high school and college. 01/23/22 ALS    Continue to  not smoke  Outpatient Encounter Medications as of 05/10/2024  Medication Sig   albuterol  (VENTOLIN  HFA) 108 (90 Base) MCG/ACT inhaler Inhale 2 puffs into the lungs every 6 (six) hours as needed for wheezing or shortness of breath.   ALPRAZolam  (XANAX ) 0.25 MG tablet TAKE 1 TABLET BY MOUTH 2 TIMES DAILY AS NEEDED FOR ANXIETY.   ARMOUR THYROID  60 MG tablet TAKE 1 TABLET BY MOUTH DAILY BEFORE BREAKFAST   CALCIUM-MAGNESIUM-ZINC PO Take by mouth daily.   cetirizine (ZYRTEC) 10 MG tablet Take 10 mg by mouth at bedtime.   cholecalciferol (VITAMIN D3) 25 MCG (1000 UNIT) tablet Take 2 tablets (2,000 Units total) by mouth daily.   Coenzyme Q10 100 MG capsule Take 1 capsule (100 mg total) by mouth daily.   Dulaglutide  (TRULICITY ) 1.5 MG/0.5ML SOAJ INJECT 1.5MG  SUBCUTANEOUSLY ONCE WEEKLY   famotidine  (PEPCID ) 20 MG tablet Take 1 tablet (20 mg total) by mouth daily as needed for heartburn or indigestion.   fluticasone  (FLONASE ) 50 MCG/ACT nasal spray Place into the nose.   gabapentin  (NEURONTIN ) 300 MG capsule Take 1 capsule (300 mg total) by mouth 2 (two) times daily.   hydroxychloroquine  (PLAQUENIL ) 200 MG tablet Take 1 tablet (200 mg total) by mouth 2 (two) times daily.   IVERMECTIN EX Apply topically. Gel   methocarbamol  (ROBAXIN ) 500 MG tablet Take 1 tablet (500 mg total) by mouth 2 (two) times daily as needed for muscle spasms.   nystatin  ointment (MYCOSTATIN ) Apply 1 application topically 2 (two) times daily.   OVER THE COUNTER MEDICATION LUNA and GABA supplements   traMADol  (ULTRAM ) 50 MG tablet Take 1 tablet (50 mg total) by mouth every 12 (twelve) hours as needed. (Patient taking differently: Take 50 mg by mouth every 12 (twelve) hours  as needed. Taking once or twice a week.)   TURMERIC PO Take by mouth.   Doxepin  HCl 3 MG TABS Take 1 tablet (3 mg total) by mouth at bedtime. (Patient not taking: Reported on 05/10/2024)   Loteprednol Etabonate (EYSUVIS OP) Apply to eye as needed. (Patient not  taking: Reported on 05/10/2024)   No facility-administered encounter medications on file as of 05/10/2024.     Review of Systems  Review of Systems  N/a Physical Exam  BP 112/80 (BP Location: Left Arm, Patient Position: Sitting, Cuff Size: Normal)   Pulse (!) 107   Temp 98.3 F (36.8 C) (Oral)   Ht 5' 9 (1.753 m)   Wt 183 lb (83 kg)   LMP 06/27/2019   SpO2 100%   BMI 27.02 kg/m   Wt Readings from Last 5 Encounters:  05/10/24 183 lb (83 kg)  04/30/24 182 lb (82.6 kg)  03/24/24 178 lb 6.4 oz (80.9 kg)  03/10/24 181 lb 6.4 oz (82.3 kg)  02/25/24 178 lb 6 oz (80.9 kg)    BMI Readings from Last 5 Encounters:  05/10/24 27.02 kg/m  04/30/24 26.88 kg/m  03/24/24 25.60 kg/m  03/10/24 26.03 kg/m  02/25/24 25.59 kg/m     Physical Exam General: Well-appearing, sitting in chair Eyes: EOMI, icterus Neck: Supple: No JVP Pulmonary: On air, normal work of breathing Cardiovascular: No edema noted Abdomen: Nondistended MSK: No synovitis, joint effusion Neuro: Normal gait, no weakness Psych: Normal mood, full affect   Assessment & Plan:   Dyspnea on exertion: Unclear etiology.  No improvement with inhaled therapies.  She has clear pulmonary parenchyma.  She has normal PFTs.  She has negative methacholine  challenge test.  There is no pulmonary contributor to her shortness of breath that is been able to be identified on multiple images.  She had a CPET in 2023 that was largely within normal limits, no clear cause.  She had echocardiogram in 2023 that other than mild valvular abnormalities was normal.  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  POTS also considered.  Will discuss with cardiology colleagues right heart catheterization (with exercise component) and ambulatory cardiac monitoring.  Consider referral to academic center if ongoing testing remains negative.    No follow-ups on file.   Follow-up after or based on results of right heart catheterization.   [name] JONELLE Beals, MD 05/10/2024   I spent 48 minutes in the care of the patient including face-to-face visit, coordination of care, review of records.

## 2024-05-19 DIAGNOSIS — Z1211 Encounter for screening for malignant neoplasm of colon: Secondary | ICD-10-CM | POA: Diagnosis not present

## 2024-05-20 ENCOUNTER — Encounter: Payer: Self-pay | Admitting: Pulmonary Disease

## 2024-05-21 ENCOUNTER — Ambulatory Visit: Payer: Self-pay | Admitting: Primary Care

## 2024-05-21 NOTE — Telephone Encounter (Signed)
 Please advise as I do not see a referral was placed for pt in LOV only a discussion with Cardiology

## 2024-05-24 ENCOUNTER — Ambulatory Visit: Payer: Self-pay | Admitting: Family Medicine

## 2024-05-24 LAB — COLOGUARD: COLOGUARD: NEGATIVE

## 2024-05-26 ENCOUNTER — Other Ambulatory Visit: Payer: Self-pay | Admitting: Family Medicine

## 2024-05-26 DIAGNOSIS — E1169 Type 2 diabetes mellitus with other specified complication: Secondary | ICD-10-CM

## 2024-05-26 NOTE — Telephone Encounter (Signed)
 Trulicity  Last filled:  04/30/24, #2 mL Last OV:  04/30/24, CPE Next OV:  none

## 2024-05-27 NOTE — Telephone Encounter (Signed)
 ERx

## 2024-05-28 ENCOUNTER — Ambulatory Visit (INDEPENDENT_AMBULATORY_CARE_PROVIDER_SITE_OTHER)

## 2024-05-28 ENCOUNTER — Ambulatory Visit: Payer: Self-pay | Attending: Cardiology | Admitting: Cardiology

## 2024-05-28 ENCOUNTER — Encounter: Payer: Self-pay | Admitting: Cardiology

## 2024-05-28 VITALS — BP 114/74 | HR 101 | Ht 69.0 in | Wt 182.4 lb

## 2024-05-28 DIAGNOSIS — R42 Dizziness and giddiness: Secondary | ICD-10-CM

## 2024-05-28 DIAGNOSIS — I472 Ventricular tachycardia, unspecified: Secondary | ICD-10-CM | POA: Diagnosis not present

## 2024-05-28 DIAGNOSIS — R0609 Other forms of dyspnea: Secondary | ICD-10-CM | POA: Diagnosis not present

## 2024-05-28 DIAGNOSIS — M3509 Sicca syndrome with other organ involvement: Secondary | ICD-10-CM | POA: Diagnosis not present

## 2024-05-28 DIAGNOSIS — R072 Precordial pain: Secondary | ICD-10-CM | POA: Diagnosis not present

## 2024-05-28 DIAGNOSIS — G901 Familial dysautonomia [Riley-Day]: Secondary | ICD-10-CM

## 2024-05-28 DIAGNOSIS — M797 Fibromyalgia: Secondary | ICD-10-CM

## 2024-05-28 DIAGNOSIS — R000 Tachycardia, unspecified: Secondary | ICD-10-CM

## 2024-05-28 MED ORDER — METOPROLOL TARTRATE 50 MG PO TABS: ORAL_TABLET | ORAL | 0 refills | Status: DC

## 2024-05-28 MED ORDER — METOPROLOL TARTRATE 100 MG PO TABS: ORAL_TABLET | ORAL | 0 refills | Status: DC

## 2024-05-28 NOTE — Patient Instructions (Signed)
 Medication Instructions:  Your physician recommends that you continue on your current medications as directed. Please refer to the Current Medication list given to you today.   *If you need a refill on your cardiac medications before your next appointment, please call your pharmacy*  Lab Work: No labs ordered today  If you have labs (blood work) drawn today and your tests are completely normal, you will receive your results only by: MyChart Message (if you have MyChart) OR A paper copy in the mail If you have any lab test that is abnormal or we need to change your treatment, we will call you to review the results.  Testing/Procedures: [name] HEWS- Long Term Monitor Instructions  Your physician has requested you wear a ZIO patch monitor for 14 days.  This is a single patch monitor. [employer] supplies one patch monitor per enrollment. Additional stickers are not available. Please do not apply patch if you will be having a Nuclear Stress Test,  Echocardiogram, Cardiac CT, MRI, or Chest Xray during the period you would be wearing the  monitor. The patch cannot be worn during these tests. You cannot remove and re-apply the  ZIO XT patch monitor.  Your ZIO patch monitor will be mailed 3 day USPS to your address on file. It may take 3-5 days  to receive your monitor after you have been enrolled.  Once you have received your monitor, please review the enclosed instructions. Your monitor  has already been registered assigning a specific monitor serial # to you.  Billing and Patient Assistance Program Information  We have supplied [employer] with any of your insurance information on file for billing purposes. [employer] offers a sliding scale Patient Assistance Program for patients that do not have  insurance, or whose insurance does not completely cover the cost of the ZIO monitor.  You must apply for the Patient Assistance Program to qualify for this discounted rate.  To apply, please call [employer] at  [phone], select option 4, select option 2, ask to apply for  Patient Assistance Program. [name] will ask your household income, and how many people  are in your household. They will quote your out-of-pocket cost based on that information.  [employer] will also be able to set up a 72-month, interest-free payment plan if needed.  Applying the monitor   Shave hair from upper left chest.  Hold abrader disc by orange tab. Rub abrader in 40 strokes over the upper left chest as  indicated in your monitor instructions.  Clean area with 4 enclosed alcohol pads. Let dry.  Apply patch as indicated in monitor instructions. Patch will be placed under collarbone on left  side of chest with arrow pointing upward.  Rub patch adhesive wings for 2 minutes. Remove white label marked 1. Remove the white  label marked 2. Rub patch adhesive wings for 2 additional minutes.  While looking in a mirror, press and release button in center of patch. A small green light will  flash 3-4 times. This will be your only indicator that the monitor has been turned on.  Do not shower for the first 24 hours. You may shower after the first 24 hours.  Press the button if you feel a symptom. You will hear a small click. Record Date, Time and  Symptom in the Patient Logbook.  When you are ready to remove the patch, follow instructions on the last 2 pages of Patient  Logbook. Stick patch monitor onto the last page of Patient Logbook.  Place  Patient Logbook in the blue and white box. Use locking tab on box and tape box closed  securely. The blue and white box has prepaid postage on it. Please place it in the mailbox as  soon as possible. Your physician should have your test results approximately 7 days after the  monitor has been mailed back to [facility].  Call [facility] Customer Care at [phone] if you have questions regarding  your ZIO XT patch monitor. Call them immediately if you see an orange light  blinking on your  monitor.  If your monitor falls off in less than 4 days, contact our Monitor department at [phone].  If your monitor becomes loose or falls off after 4 days call [employer] at [phone] for  suggestions on securing your monitor     Your cardiac CT will be scheduled at one of the below locations:   [facility] [address] [place], [place] [address] [phone]  If scheduled at [facility], please arrive to the Heart and Vascular Center 15 mins early for check-in and test prep.  There is spacious parking and easy access to the radiology department from the [facility] and V[facility] entrance. Please enter here and check-in with the desk attendant.   Please follow these instructions carefully (unless otherwise directed):  An IV will be required for this test and Nitroglycerin will be given.  Hold all erectile dysfunction medications at least 3 days (72 hrs) prior to test. (Ie viagra, cialis, sildenafil, tadalafil, etc)   On the Night Before the Test: Be sure to Drink plenty of water. Do not consume any caffeinated/decaffeinated beverages or chocolate 12 hours prior to your test. Do not take any antihistamines 12 hours prior to your test.  On the Day of the Test: Drink plenty of water until 1 hour prior to the test. Do not eat any food 1 hour prior to test. You may take your regular medications prior to the test.  Take metoprolol (Lopressor) two hours prior to test. If you take Furosemide/Hydrochlorothiazide/Spironolactone/Chlorthalidone, please HOLD on the morning of the test. Patients who wear a continuous glucose monitor MUST remove the device prior to scanning. FEMALES- please wear underwire-free bra if available, avoid dresses & tight clothing       After the Test: Drink plenty of water. After receiving IV contrast, you may experience a mild flushed feeling. This is normal. On occasion, you may  experience a mild rash up to 24 hours after the test. This is not dangerous. If this occurs, you can take Benadryl 25 mg, Zyrtec, Claritin, or Allegra and increase your fluid intake. (Patients taking Tikosyn should avoid Benadryl, and may take Zyrtec, Claritin, or Allegra) If you experience trouble breathing, this can be serious. If it is severe call 911 IMMEDIATELY. If it is mild, please call our office.  We will call to schedule your test 2-4 weeks out understanding that some insurance companies will need an authorization prior to the service being performed.   For more information and frequently asked questions, please visit our website : [email or web address]  For non-scheduling related questions, please contact the cardiac imaging nurse navigator should you have any questions/concerns: Cardiac Imaging Nurse Navigators Direct Office Dial: [phone]   For scheduling needs, including cancellations and rescheduling, please call [place], [phone].   Follow-Up: At [facility], you and your health needs are our priority.  As part of our continuing mission to provide you with exceptional heart care, our providers are  all part of one team.  This team includes your primary Cardiologist (physician) and Advanced Practice Providers or APPs (Physician Assistants and Nurse Practitioners) who all work together to provide you with the care you need, when you need it.  Your next appointment:   2 month(s)  Provider:   You may see Timothy Gollan, MD or one of the following Advanced Practice Providers on your designated Care Team:   Lonni Meager, NP Lesley Maffucci, PA-C Bernardino Bring, PA-C Cadence Walker, PA-C Tylene Lunch, NP Barnie Hila, NP

## 2024-05-28 NOTE — Progress Notes (Signed)
 Cardiology Office Note   Date:  05/28/2024  ID:  Levonia, Wolfley 1961/02/11, MRN [id number] PCP: Rilla Baller, MD  [facility] Providers Cardiologist:  Evalene Lunger, MD Cardiology APP:  Gerard Frederick, NP     History of Present Illness [name] is a 63 y.o. female with a past medical history of tachycardia, chronic shortness of breath, chronic migraines, Hashimoto's, serotonin syndrome, and chronic fatigue, who is here for follow-up.  Prior echocardiogram completed in August 2023 revealed an LVEF of 55 to 60%, no RWMA, right ventricular systolic function normal size and function, trivial mitral valve regurgitation.  Patient was last seen in clinic by Dr. Gollan on 05/14/2022.  At that time she had complaints of shortness of breath and tried inhalers with no improvement in symptoms.  Echo with no notable findings and a normal EF.  Cardiopulmonary stress testing in [place] was recommended.  Symptoms were likely from deconditioning.  No medication changes were made.  Less likely ischemia but had discussed the option of cardiac CTA which she had recently had 2 CT scans and was concerned about radiation exposure.  There were no medication changes that were made or further testing that was ordered.  She was referred back to her PCPs office for ongoing care of her chronic issues.  She returns to clinic today with several concerns and complaints and frustrations.  She states that she has been having ongoing shortness of breath that has been over the last 2 years.  She has had DOE and chronic cough and even chest discomfort.  She has had palpitations and tachycardia.  She has undergone a plethora of pulmonary test with no answers and was recently told by her pulmonologist that she needs a heart monitor and likely a right heart catheterization.  She has also been advised that she likely has dysautonomia with her multiple episodes of near syncope.  And she has been starting  to suffer from muscle wasting primarily in her thighs.  She continues to wear compression stockings.  She suffers from multiple connective tissue disorders.  She does have unsteadiness, lightheadedness, and difficulty with balance.  She denies any recent hospitalizations or visits to the emergency department.  ROS: 10 point review of systems has been reviewed and considered negative the exception was been listed in the HPI  Studies Reviewed EKG Interpretation Date/Time:  Friday May 28 2024 10:47:22 EDT Ventricular Rate:  101 PR Interval:  216 QRS Duration:  84 QT Interval:  344 QTC Calculation: 446 R Axis:   50  Text Interpretation: Sinus tachycardia with 1st degree A-V block Low voltage QRS When compared with ECG of 20-Apr-2004 15:36, Confirmed by Gerard Frederick ([id number]) on 05/28/2024 11:09:52 AM    2d echo 04/22/2022 1. Left ventricular ejection fraction, by estimation, is 55 to 60%. Left  ventricular ejection fraction by 3D volume is 56 %. The left ventricle has  normal function. The left ventricle has no regional wall motion  abnormalities. Left ventricular diastolic   parameters were normal. The average left ventricular global longitudinal  strain is -20.4 %. The global longitudinal strain is normal.   2. Right ventricular systolic function is normal. The right ventricular  size is normal. There is normal pulmonary artery systolic pressure. The  estimated right ventricular systolic pressure is 17.0 mmHg.   3. The mitral valve is normal in structure. Trivial mitral valve  regurgitation. No evidence of mitral stenosis.   4. The aortic valve is tricuspid. Aortic valve regurgitation is  not  visualized. No aortic stenosis is present.   5. The inferior vena cava is normal in size with greater than 50%  respiratory variability, suggesting right atrial pressure of 3 mmHg.   Risk Assessment/Calculations           Physical Exam VS:  BP 114/74 (BP Location: Left Arm, Patient  Position: Sitting, Cuff Size: Normal)   Pulse (!) 101   Ht 5' 9 (1.753 m)   Wt 182 lb 6.4 oz (82.7 kg)   LMP 06/27/2019   SpO2 99%   BMI 26.94 kg/m   Orthostatic VS for the past 24 hrs (Last 3 readings):  BP- Lying Pulse- Lying BP- Sitting Pulse- Sitting BP- Standing at 0 minutes Pulse- Standing at 0 minutes BP- Standing at 3 minutes Pulse- Standing at 3 minutes  05/28/24 1050 114/79 99 111/74 107 98/75 115 99/72 109      Wt Readings from Last 3 Encounters:  05/28/24 182 lb 6.4 oz (82.7 kg)  05/10/24 183 lb (83 kg)  04/30/24 182 lb (82.6 kg)    GEN: Well nourished, well developed in no acute distress NECK: No JVD; No carotid bruits CARDIAC: RRR, tachycardic, no murmurs, rubs, gallops RESPIRATORY:  Clear to auscultation without rales, wheezing or rhonchi  ABDOMEN: Soft, non-tender, non-distended EXTREMITIES:  No edema; No deformity   ASSESSMENT AND PLAN Ongoing progressive and worsening shortness of breath concerning for an anginal equivalent at this time.  Shortness of breath started in 2023.  She has tried inhalers with no improvement in symptoms.  Echocardiogram with no notable findings with normal ejection fraction no significant valvular abnormalities were noted.  She underwent cardiopulmonary stress testing in [place] that was unrevealing.  She is continue to follow with pulmonary and has tried several different test that continue to be unrevealing.  At this time she has been scheduled for coronary CTA to rule out any ischemic causes of her progressive and worsening shortness of breath.  If her coronary CTA is unrevealing she will likely benefit from a right heart catheterization to understand hemodynamics for better guidance of care.  Typical and atypical chest discomfort/precordial pain.  That happens with rest or exertion.  No alleviating or aggravating factors. EKG reveals sinus tachycardia with a rate of 101 with first-degree AV block.  She is scheduled for the coronary CTA  to rule out any ischemic causes of her discomfort.  Dysautonomia with tachycardia noted on EKG today even though she is not orthostatic she does suffer from orthostatic like symptoms.  EKG revealed sinus tachycardia.  She is being placed on a ZIO XT monitor for 2 weeks to rule out any arrhythmia or pauses.  She is encouraged to continue with adequate hydration, salt loading in the a.m. and continue with compression therapy.  Longstanding history of connective tissue disorders.  She continues to follow with rheumatoid.  Mixed connective tissue disease can sometimes be associated with secondary cardiac amyloidosis.  Will discuss cardiac MRI on return if previous testing is unrevealing.       Dispo: Patient to return to clinic to see MD/APP once testing is completed or sooner if needed for further evaluation.  Signed, Mitchel Delduca, NP

## 2024-05-29 LAB — BASIC METABOLIC PANEL WITH GFR
BUN/Creatinine Ratio: 28 (ref 12–28)
BUN: 27 mg/dL (ref 8–27)
CO2: 22 mmol/L (ref 20–29)
Calcium: 9.7 mg/dL (ref 8.7–10.3)
Chloride: 103 mmol/L (ref 96–106)
Creatinine, Ser: 0.98 mg/dL (ref 0.57–1.00)
Glucose: 70 mg/dL (ref 70–99)
Potassium: 4.6 mmol/L (ref 3.5–5.2)
Sodium: 139 mmol/L (ref 134–144)
eGFR: 66 mL/min/1.73 (ref >59)

## 2024-05-31 ENCOUNTER — Other Ambulatory Visit: Payer: Self-pay

## 2024-05-31 ENCOUNTER — Ambulatory Visit: Payer: Self-pay | Admitting: Cardiology

## 2024-05-31 DIAGNOSIS — F329 Major depressive disorder, single episode, unspecified: Secondary | ICD-10-CM | POA: Diagnosis not present

## 2024-05-31 NOTE — Telephone Encounter (Signed)
 Can one of you ladies check on that for her? I think they only do them in the hospital at [facility] in [place] but if she is able and willing they do them in [place] at the [facility]. Please let her know it was a pleasure to meeting her and I hope we can get to the bottom of some of these issues for her.

## 2024-05-31 NOTE — Addendum Note (Signed)
 Addended by: Dalayah Deahl D on: 05/31/2024 01:25 PM   Modules accepted: Orders

## 2024-05-31 NOTE — Progress Notes (Signed)
 Preprocedural labs are stable.

## 2024-05-31 NOTE — Telephone Encounter (Signed)
 Last read by Zebedee Drucella Graydon Jerel at 11:44AM on 05/31/2024.

## 2024-05-31 NOTE — Progress Notes (Signed)
 Order for Coronary CTA changed to external location due to insurance coverage

## 2024-05-31 NOTE — Addendum Note (Signed)
 Addended by: MAUDINE SLOUGH D on: 05/31/2024 08:59 AM   Modules accepted: Orders

## 2024-06-07 ENCOUNTER — Encounter (HOSPITAL_COMMUNITY): Payer: Self-pay

## 2024-06-08 ENCOUNTER — Other Ambulatory Visit: Payer: Self-pay | Admitting: Family Medicine

## 2024-06-08 DIAGNOSIS — M797 Fibromyalgia: Secondary | ICD-10-CM

## 2024-06-09 ENCOUNTER — Encounter (HOSPITAL_COMMUNITY): Payer: Self-pay

## 2024-06-09 ENCOUNTER — Ambulatory Visit (HOSPITAL_COMMUNITY)
Admission: RE | Admit: 2024-06-09 | Discharge: 2024-06-09 | Disposition: A | Discharge status: Home / Self Care | Payer: Self-pay | Source: Ambulatory Visit | Attending: Cardiology | Admitting: Cardiology

## 2024-06-09 ENCOUNTER — Other Ambulatory Visit (HOSPITAL_COMMUNITY): Payer: Self-pay | Admitting: Emergency Medicine

## 2024-06-09 DIAGNOSIS — R42 Dizziness and giddiness: Secondary | ICD-10-CM | POA: Insufficient documentation

## 2024-06-09 DIAGNOSIS — R000 Tachycardia, unspecified: Secondary | ICD-10-CM | POA: Diagnosis not present

## 2024-06-09 MED ORDER — SODIUM CHLORIDE 0.9 % IV SOLN: Freq: Once | INTRAVENOUS | Status: AC

## 2024-06-09 MED ORDER — IVABRADINE HCL 5 MG PO TABS: 15.0000 mg | ORAL_TABLET | Freq: Once | ORAL | 0 refills | Status: AC

## 2024-06-09 MED ORDER — IOHEXOL 350 MG/ML SOLN: 100.0000 mL | Freq: Once | INTRAVENOUS | Status: DC | PRN

## 2024-06-09 NOTE — Progress Notes (Signed)
 After giving patient 500ml NS bolus for low B/P, B/P was still 94/63 and Dr. Shlomo wanted to cancel cardiac CT.

## 2024-06-09 NOTE — Telephone Encounter (Signed)
 Looks like Dr Shlomo is who cancelled it. Did they say they would be reordering it? There are several different medications that can be used for this test to slow down the heart rate. Jon can you follow up on it and see where we are and what the plan was if we need to have it reordered in a few weeks for her or if they were going to try and reorder it again with a different medication?

## 2024-06-09 NOTE — Telephone Encounter (Signed)
 Name of Medication:  Tramadol  Name of Pharmacy:  [facility] or Written Date and Quantity:  04/23/24, #20 Last Office Visit and Type:  04/30/24, CPE Next Office Visit and Type:  none Last Controlled Substance Agreement Date:  07/08/14 Last UDS:  07/08/14

## 2024-06-10 ENCOUNTER — Ambulatory Visit

## 2024-06-11 DIAGNOSIS — F329 Major depressive disorder, single episode, unspecified: Secondary | ICD-10-CM | POA: Diagnosis not present

## 2024-06-13 NOTE — Telephone Encounter (Signed)
 Make sure to hydrate throughout the day and do the salt load in the morning. Either with the added salt packets, the electrolyte packets, or Gatorade/Pedialyte.

## 2024-06-14 ENCOUNTER — Other Ambulatory Visit: Payer: Self-pay | Admitting: Family Medicine

## 2024-06-14 DIAGNOSIS — F325 Major depressive disorder, single episode, in full remission: Secondary | ICD-10-CM

## 2024-06-14 NOTE — Telephone Encounter (Signed)
 ERx

## 2024-06-14 NOTE — Telephone Encounter (Signed)
 It is usually 1/2 a teaspoon per 20 ounces of water

## 2024-06-15 ENCOUNTER — Encounter (HOSPITAL_COMMUNITY): Payer: Self-pay

## 2024-06-18 ENCOUNTER — Ambulatory Visit (HOSPITAL_COMMUNITY)
Admission: RE | Admit: 2024-06-18 | Discharge: 2024-06-18 | Disposition: A | Discharge status: Home / Self Care | Source: Ambulatory Visit | Attending: Cardiology | Admitting: Cardiology

## 2024-06-18 DIAGNOSIS — R000 Tachycardia, unspecified: Secondary | ICD-10-CM | POA: Insufficient documentation

## 2024-06-18 DIAGNOSIS — R072 Precordial pain: Secondary | ICD-10-CM

## 2024-06-18 DIAGNOSIS — R42 Dizziness and giddiness: Secondary | ICD-10-CM | POA: Insufficient documentation

## 2024-06-18 MED ORDER — IOHEXOL 350 MG/ML SOLN
100.0000 mL | Freq: Once | INTRAVENOUS | Status: AC | PRN
  Administered 2024-06-18: 100 mL via INTRAVENOUS

## 2024-06-18 MED ORDER — NITROGLYCERIN 0.4 MG SL SUBL
0.8000 mg | SUBLINGUAL_TABLET | Freq: Once | SUBLINGUAL | Status: AC
  Administered 2024-06-18: 0.8 mg via SUBLINGUAL

## 2024-06-21 NOTE — Progress Notes (Signed)
 Coronary calcium score of 0.  No significant coronary artery disease noted.Awaiting CT of the chest over read from radiology.

## 2024-06-22 DIAGNOSIS — I472 Ventricular tachycardia, unspecified: Secondary | ICD-10-CM | POA: Diagnosis not present

## 2024-06-25 DIAGNOSIS — F329 Major depressive disorder, single episode, unspecified: Secondary | ICD-10-CM | POA: Diagnosis not present

## 2024-06-27 DIAGNOSIS — I472 Ventricular tachycardia, unspecified: Secondary | ICD-10-CM

## 2024-06-29 NOTE — Progress Notes (Signed)
 Average heart rate 102 bpm, there were 54 triggered events associated with normal sinus rhythm or sinus tachycardia.  Can discuss possible medication changes on return appointment already scheduled with Dr. Gollan on 08/02/2024.

## 2024-07-06 ENCOUNTER — Encounter: Payer: Self-pay | Admitting: Family Medicine

## 2024-07-06 DIAGNOSIS — M797 Fibromyalgia: Secondary | ICD-10-CM

## 2024-07-07 DIAGNOSIS — F411 Generalized anxiety disorder: Secondary | ICD-10-CM | POA: Diagnosis not present

## 2024-07-09 LAB — OPHTHALMOLOGY REPORT-SCANNED

## 2024-07-15 ENCOUNTER — Other Ambulatory Visit: Payer: Self-pay | Admitting: Family Medicine

## 2024-07-16 DIAGNOSIS — F329 Major depressive disorder, single episode, unspecified: Secondary | ICD-10-CM | POA: Diagnosis not present

## 2024-07-16 NOTE — Telephone Encounter (Unsigned)
 Copied from CRM [id number]. Topic: Clinical - Medication Question >> Jul 16, 2024 11:23 AM Anairis L wrote: Reason for CRM: Please call patient regarding  gabapentin  (NEURONTIN ) 300 MG capsule increase, she has sent a msg since 07/06/2024 and no response.

## 2024-07-19 MED ORDER — GABAPENTIN 300 MG PO CAPS: 300.0000 mg | ORAL_CAPSULE | Freq: Three times a day (TID) | ORAL | 1 refills | Status: DC

## 2024-07-25 ENCOUNTER — Other Ambulatory Visit: Payer: Self-pay | Admitting: Physician Assistant

## 2024-07-26 NOTE — Telephone Encounter (Signed)
 Last Fill: 03/24/2024  Eye exam: 09/30/2023   Labs: 04/23/2024 CMP WNL  05/28/2024 BMP WNL   Next Visit: 08/24/2024  Last Visit: 03/24/2024  DX: Sjogren's syndrome with other organ involvement   Current Dose per office note on 03/24/2024: Plaquenil  200 mg 1 tablet by mouth twice daily.   Okay to refill Plaquenil ?

## 2024-07-30 ENCOUNTER — Other Ambulatory Visit (HOSPITAL_COMMUNITY): Payer: Self-pay | Admitting: *Deleted

## 2024-07-30 ENCOUNTER — Telehealth (HOSPITAL_COMMUNITY): Payer: Self-pay | Admitting: *Deleted

## 2024-07-30 DIAGNOSIS — R0609 Other forms of dyspnea: Secondary | ICD-10-CM

## 2024-07-30 NOTE — Telephone Encounter (Signed)
 Called patient to schedule right heart catheterization with Dr. Rosalynd per Pulmonology referral. RHC scheduled, request for insurance authorization sent to Pulmonology office.   Procedure instructions sent to patient via MyChart per her request with instructions to call us  with any questions, cancellations, or re-scheduling requests.
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
## 2024-08-02 ENCOUNTER — Ambulatory Visit: Payer: Self-pay | Attending: Cardiovascular Disease | Admitting: Cardiovascular Disease

## 2024-08-02 ENCOUNTER — Telehealth (HOSPITAL_COMMUNITY): Payer: Self-pay | Admitting: Cardiology

## 2024-08-02 ENCOUNTER — Encounter: Payer: Self-pay | Admitting: Orthopaedic Surgery

## 2024-08-02 ENCOUNTER — Encounter: Payer: Self-pay | Admitting: Cardiovascular Disease

## 2024-08-02 ENCOUNTER — Other Ambulatory Visit: Payer: Self-pay

## 2024-08-02 ENCOUNTER — Ambulatory Visit: Admitting: Orthopaedic Surgery

## 2024-08-02 VITALS — BP 100/60 | HR 81 | Ht 69.5 in | Wt 184.1 lb

## 2024-08-02 DIAGNOSIS — M797 Fibromyalgia: Secondary | ICD-10-CM

## 2024-08-02 DIAGNOSIS — R000 Tachycardia, unspecified: Secondary | ICD-10-CM

## 2024-08-02 DIAGNOSIS — E1169 Type 2 diabetes mellitus with other specified complication: Secondary | ICD-10-CM | POA: Diagnosis not present

## 2024-08-02 DIAGNOSIS — M3509 Sicca syndrome with other organ involvement: Secondary | ICD-10-CM

## 2024-08-02 DIAGNOSIS — G8929 Other chronic pain: Secondary | ICD-10-CM

## 2024-08-02 DIAGNOSIS — M2241 Chondromalacia patellae, right knee: Secondary | ICD-10-CM | POA: Diagnosis not present

## 2024-08-02 DIAGNOSIS — R072 Precordial pain: Secondary | ICD-10-CM

## 2024-08-02 DIAGNOSIS — M25561 Pain in right knee: Secondary | ICD-10-CM | POA: Diagnosis not present

## 2024-08-02 DIAGNOSIS — M2242 Chondromalacia patellae, left knee: Secondary | ICD-10-CM

## 2024-08-02 DIAGNOSIS — M25562 Pain in left knee: Secondary | ICD-10-CM | POA: Diagnosis not present

## 2024-08-02 DIAGNOSIS — R0609 Other forms of dyspnea: Secondary | ICD-10-CM

## 2024-08-02 DIAGNOSIS — G901 Familial dysautonomia [Riley-Day]: Secondary | ICD-10-CM | POA: Diagnosis not present

## 2024-08-02 DIAGNOSIS — I472 Ventricular tachycardia, unspecified: Secondary | ICD-10-CM

## 2024-08-02 DIAGNOSIS — R42 Dizziness and giddiness: Secondary | ICD-10-CM

## 2024-08-02 MED ORDER — METOPROLOL TARTRATE 25 MG PO TABS: 25.0000 mg | ORAL_TABLET | Freq: Every day | ORAL | 3 refills | Status: AC
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
## 2024-08-02 NOTE — Patient Instructions (Addendum)
 Medication Instructions:   Please start metoprolol  tartrate 25 mg every morning Start on 1/2 dose pill for first week  Ok to take propranolol in addition to metoprolol  as needed  If you need a refill on your cardiac medications before your next appointment, please call your pharmacy.   Lab work: No new labs needed  Testing/Procedures: No new testing needed  Follow-Up: At [facility], you and your health needs are our priority.  As part of our continuing mission to provide you with exceptional heart care, we have created designated Provider Care Teams.  These Care Teams include your primary Cardiologist (physician) and Advanced Practice Providers (APPs -  Physician Assistants and Nurse Practitioners) who all work together to provide you with the care you need, when you need it.  You will need a follow up appointment as needed  Providers on your designated Care Team:   Lonni Meager, NP Bernardino Bring, PA-C Cadence [place], [place]  COVID-19 Vaccine Information can be found at: [email or web address] For questions related to vaccine distribution or appointments, please email [email or web address] or call [phone].    I. Non-Pharmacological Treatments (First-Line)  These are often the first step in managing POTS and are crucial for all patients.  Fluid and Salt Intake:  Increase Fluids: Aim for 2-3 liters (64-100 ounces) of fluids daily, primarily water. Staying well-hydrated helps increase blood volume.   Increase Salt: Consume 3,000-10,000 mg of sodium (salt) per day, as recommended by your doctor. This helps the body retain water and increase blood volume. Good sources include broth, pickles, olives, and adding salt to meals.  Dietary Modifications:  Small, Frequent Meals: Eating smaller meals more often can prevent blood from pooling in the digestive system after large meals, which can worsen symptoms.  Balanced  Diet: Focus on a diet rich in protein, vegetables, fruits, and complex carbohydrates.  Avoid Triggers: Limit or avoid alcohol and excessive caffeine, as they can worsen dehydration and symptoms. Some people also find it helpful to reduce refined carbohydrates.   Exercise and Physical Activity:  Start Slowly and Non-Upright: Begin with exercises that don't involve prolonged upright positions, such as recumbent biking, rowing, or swimming.  Gradual Progression: Slowly increase the duration and intensity of exercise. As tolerance improves, upright exercises can be gradually introduced.  Leg Strengthening: Include exercises to strengthen leg muscles, which can help pump blood back to the heart.  Isometric Exercises: Practicing isometric exercises (contracting muscles without moving) can help push blood toward the heart, especially before getting up.  Compression Garments:  High-Waist Compression: Wear medical-grade compression stockings or garments that extend at least to the waist or include an abdominal binder. This helps reduce blood pooling in the legs and abdomen.  Lifestyle Adjustments:  Slow Transitions: Get up slowly from lying or sitting positions to allow your body to adjust.  Elevate Head of Bed: Raising the head of your bed by 6-10 inches can help increase circulating fluid volume in the mornings.  Avoid Heat: Hot environments (hot baths/showers, hot days) can worsen symptoms, so try to keep cool. Using a shower chair can be helpful.   Physical Counter-Maneuvers: If you feel lightheaded, try crossing your legs, tensing your gluteal and abdominal muscles, squatting, or lying down with your legs raised to help return blood to the heart.  Sleep Hygiene: Maintain a consistent sleep schedule and ensure your bedroom is at an ideal temperature for rest. Avoid excessive daytime napping and screen time before bed.  Breathing Physiotherapy: This can help address breathlessness and  improve breathing patterns often seen in POTS.  II. Pharmacological Treatments  While no medications are FDA-approved specifically for POTS, several off-label medications are used to manage symptoms, often in combination with non-pharmacological approaches. The choice of medication depends on the specific subtype of POTS and the patient's individual symptoms.  To Improve Blood Volume:  Fludrocortisone (Florinef): A mineralocorticoid that helps the kidneys retain sodium and water, increasing blood volume. Potassium levels may need to be monitored.  Desmopressin (DDAVP): An antidiuretic hormone that reduces urine production, promoting fluid retention.  To Reduce Heart Rate:  Beta-blockers (e.g., Propranolol, Bisoprolol, Metoprolol ): These medications slow the heart rate and reduce the force of heart contractions. They are particularly useful for those with elevated norepinephrine levels or a hyperadrenergic state.  Ivabradine  (Corlanor): Works by selectively lowering heart rate without affecting blood pressure.  To Improve Blood Vessel Constriction (Vasoconstrictors):  Midodrine: An alpha-agonist that constricts blood vessels, helping to return blood to the heart and raise blood pressure, especially useful for those with significant drops in blood pressure upon standing.  Other Medications:  Pyridostigmine (Mestinon): A cholinesterase inhibitor that can improve nerve signals, particularly helpful for those with suspected autonomic neuropathy.  Clonidine: A centrally acting sympatholytic drug that can reduce sympathetic activity, helpful for hyperadrenergic POTS with hypertensive tendencies.  Stimulants (e.g., Modafinil, Methylphenidate): May be used to help with fatigue and brain fog in some patients.  SSRIs (Selective Serotonin Reuptake Inhibitors): Certain antidepressants like paroxetine or sertraline may help raise blood pressure and modify the brain's response to low blood  pressure signals, especially in patients prone to fainting.

## 2024-08-02 NOTE — Telephone Encounter (Signed)
 Pt called to cancel procedure 12/15-cath Does not wish to reschedule at this time

## 2024-08-03 ENCOUNTER — Other Ambulatory Visit: Payer: Self-pay

## 2024-08-03 ENCOUNTER — Other Ambulatory Visit: Payer: Self-pay | Admitting: Family Medicine

## 2024-08-03 DIAGNOSIS — M2241 Chondromalacia patellae, right knee: Secondary | ICD-10-CM

## 2024-08-03 DIAGNOSIS — G8929 Other chronic pain: Secondary | ICD-10-CM

## 2024-08-03 DIAGNOSIS — M2242 Chondromalacia patellae, left knee: Secondary | ICD-10-CM

## 2024-08-03 DIAGNOSIS — F325 Major depressive disorder, single episode, in full remission: Secondary | ICD-10-CM

## 2024-08-03 NOTE — Telephone Encounter (Signed)
 ERx

## 2024-08-04 DIAGNOSIS — F331 Major depressive disorder, recurrent, moderate: Secondary | ICD-10-CM | POA: Diagnosis not present

## 2024-08-04 DIAGNOSIS — F411 Generalized anxiety disorder: Secondary | ICD-10-CM | POA: Diagnosis not present

## 2024-08-09 ENCOUNTER — Ambulatory Visit (HOSPITAL_COMMUNITY): Admit: 2024-08-09 | Payer: Self-pay | Admitting: Internal Medicine

## 2024-08-09 ENCOUNTER — Encounter (HOSPITAL_COMMUNITY): Payer: Self-pay

## 2024-08-09 SURGERY — RIGHT HEART CATH AND CORONARY ANGIOGRAPHY
Anesthesia: LOCAL
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
## 2024-08-13 DIAGNOSIS — F329 Major depressive disorder, single episode, unspecified: Secondary | ICD-10-CM | POA: Diagnosis not present

## 2024-08-16 ENCOUNTER — Other Ambulatory Visit: Payer: Self-pay | Admitting: Family Medicine

## 2024-08-16 DIAGNOSIS — M62838 Other muscle spasm: Secondary | ICD-10-CM

## 2024-08-16 DIAGNOSIS — M797 Fibromyalgia: Secondary | ICD-10-CM

## 2024-08-16 NOTE — Telephone Encounter (Signed)
 Name of Medication:  Tramadol  Name of Pharmacy:  [facility] or Written Date and Quantity:  06/14/24, #20 Last Office Visit and Type:  04/30/24, CPE Next Office Visit and Type:  none Last Controlled Substance Agreement Date:  07/08/14 Last UDS:  07/08/14  Robaxin  last filled:  04/30/24, #60

## 2024-08-17 NOTE — Telephone Encounter (Signed)
 ERx

## 2024-08-24 ENCOUNTER — Encounter: Payer: Self-pay | Admitting: Physician Assistant

## 2024-08-24 ENCOUNTER — Encounter: Payer: Self-pay | Admitting: Cardiovascular Disease

## 2024-08-24 ENCOUNTER — Ambulatory Visit: Attending: Physician Assistant | Admitting: Physician Assistant

## 2024-08-24 VITALS — BP 89/66 | HR 98 | Temp 97.1°F | Resp 16 | Ht 69.5 in | Wt 182.6 lb

## 2024-08-24 DIAGNOSIS — Z79899 Other long term (current) drug therapy: Secondary | ICD-10-CM

## 2024-08-24 DIAGNOSIS — M19042 Primary osteoarthritis, left hand: Secondary | ICD-10-CM

## 2024-08-24 DIAGNOSIS — K200 Eosinophilic esophagitis: Secondary | ICD-10-CM

## 2024-08-24 DIAGNOSIS — J4540 Moderate persistent asthma, uncomplicated: Secondary | ICD-10-CM

## 2024-08-24 DIAGNOSIS — M19041 Primary osteoarthritis, right hand: Secondary | ICD-10-CM | POA: Diagnosis not present

## 2024-08-24 DIAGNOSIS — Z8589 Personal history of malignant neoplasm of other organs and systems: Secondary | ICD-10-CM

## 2024-08-24 DIAGNOSIS — R5383 Other fatigue: Secondary | ICD-10-CM

## 2024-08-24 DIAGNOSIS — G6289 Other specified polyneuropathies: Secondary | ICD-10-CM | POA: Diagnosis not present

## 2024-08-24 DIAGNOSIS — E559 Vitamin D deficiency, unspecified: Secondary | ICD-10-CM

## 2024-08-24 DIAGNOSIS — E538 Deficiency of other specified B group vitamins: Secondary | ICD-10-CM

## 2024-08-24 DIAGNOSIS — M797 Fibromyalgia: Secondary | ICD-10-CM | POA: Diagnosis not present

## 2024-08-24 DIAGNOSIS — R29898 Other symptoms and signs involving the musculoskeletal system: Secondary | ICD-10-CM

## 2024-08-24 DIAGNOSIS — Z96642 Presence of left artificial hip joint: Secondary | ICD-10-CM | POA: Diagnosis not present

## 2024-08-24 DIAGNOSIS — H8111 Benign paroxysmal vertigo, right ear: Secondary | ICD-10-CM

## 2024-08-24 DIAGNOSIS — E063 Autoimmune thyroiditis: Secondary | ICD-10-CM

## 2024-08-24 DIAGNOSIS — M3509 Sicca syndrome with other organ involvement: Secondary | ICD-10-CM

## 2024-08-24 DIAGNOSIS — G8929 Other chronic pain: Secondary | ICD-10-CM

## 2024-08-24 DIAGNOSIS — M6281 Muscle weakness (generalized): Secondary | ICD-10-CM

## 2024-08-24 DIAGNOSIS — M2240 Chondromalacia patellae, unspecified knee: Secondary | ICD-10-CM | POA: Diagnosis not present

## 2024-08-24 DIAGNOSIS — M545 Low back pain, unspecified: Secondary | ICD-10-CM | POA: Diagnosis not present

## 2024-08-24 DIAGNOSIS — Z85828 Personal history of other malignant neoplasm of skin: Secondary | ICD-10-CM

## 2024-08-24 DIAGNOSIS — R61 Generalized hyperhidrosis: Secondary | ICD-10-CM

## 2024-08-24 MED ORDER — HYDROXYCHLOROQUINE SULFATE 200 MG PO TABS: 200.0000 mg | ORAL_TABLET | Freq: Two times a day (BID) | ORAL | 0 refills | Status: AC

## 2024-08-26 ENCOUNTER — Ambulatory Visit: Payer: Self-pay | Admitting: Physician Assistant

## 2024-08-26 NOTE — Progress Notes (Signed)
 Creatinine is elevated-1.14 and GFR is low-55-avoid the use of NSAIDs and increase water intake.  Rest of CMP WNL.  Recommend repeating BMP with GFR in 7-10 days.   CBC WNL UA normal  ESR WNL Complements WNL

## 2024-08-27 ENCOUNTER — Ambulatory Visit

## 2024-08-27 LAB — URINALYSIS, ROUTINE W REFLEX MICROSCOPIC
Bilirubin Urine: NEGATIVE
Glucose, UA: NEGATIVE
Hgb urine dipstick: NEGATIVE
Ketones, ur: NEGATIVE
Leukocytes,Ua: NEGATIVE
Nitrite: NEGATIVE
Protein, ur: NEGATIVE
Specific Gravity, Urine: 1.015 (ref 1.001–1.035)
pH: 6 (ref 5.0–8.0)

## 2024-08-27 LAB — ANTI-NUCLEAR AB-TITER (ANA TITER): ANA Titer 1: 1:80 {titer} — ABNORMAL HIGH

## 2024-08-27 LAB — CBC WITH DIFFERENTIAL/PLATELET
Absolute Lymphocytes: 2021 {cells}/uL (ref 850–3900)
Absolute Monocytes: 456 {cells}/uL (ref 200–950)
Basophils Absolute: 58 {cells}/uL (ref 0–200)
Basophils Relative: 1.2 %
Eosinophils Absolute: 168 {cells}/uL (ref 15–500)
Eosinophils Relative: 3.5 %
HCT: 41.8 % (ref 35.9–46.0)
Hemoglobin: 14.1 g/dL (ref 11.7–15.5)
MCH: 31.4 pg (ref 27.0–33.0)
MCHC: 33.7 g/dL (ref 31.6–35.4)
MCV: 93.1 fL (ref 81.4–101.7)
MPV: 9.8 fL (ref 7.5–12.5)
Monocytes Relative: 9.5 %
Neutro Abs: 2098 {cells}/uL (ref 1500–7800)
Neutrophils Relative %: 43.7 %
Platelets: 293 Thousand/uL (ref 140–400)
RBC: 4.49 Million/uL (ref 3.80–5.10)
RDW: 12.9 % (ref 11.0–15.0)
Total Lymphocyte: 42.1 %
WBC: 4.8 Thousand/uL (ref 3.8–10.8)

## 2024-08-27 LAB — COMPREHENSIVE METABOLIC PANEL WITH GFR
AG Ratio: 1.5 (calc) (ref 1.0–2.5)
ALT: 13 U/L (ref 6–29)
AST: 20 U/L (ref 10–35)
Albumin: 4.4 g/dL (ref 3.6–5.1)
Alkaline phosphatase (APISO): 75 U/L (ref 37–153)
BUN/Creatinine Ratio: 20 (calc) (ref 6–22)
BUN: 23 mg/dL (ref 7–25)
CO2: 26 mmol/L (ref 20–32)
Calcium: 9.6 mg/dL (ref 8.6–10.4)
Chloride: 105 mmol/L (ref 98–110)
Creat: 1.14 mg/dL — ABNORMAL HIGH (ref 0.50–1.05)
Globulin: 2.9 g/dL (ref 1.9–3.7)
Glucose, Bld: 80 mg/dL (ref 65–99)
Potassium: 4.6 mmol/L (ref 3.5–5.3)
Sodium: 138 mmol/L (ref 135–146)
Total Bilirubin: 0.4 mg/dL (ref 0.2–1.2)
Total Protein: 7.3 g/dL (ref 6.1–8.1)
eGFR: 55 mL/min/1.73m2 — ABNORMAL LOW

## 2024-08-27 LAB — ANTI-SMITH ANTIBODY: ENA SM Ab Ser-aCnc: <1 AI

## 2024-08-27 LAB — C3 AND C4
C3 Complement: 144 mg/dL (ref 83–193)
C4 Complement: 22 mg/dL (ref 15–57)

## 2024-08-27 LAB — ANA: Anti Nuclear Antibody (ANA): POSITIVE — AB

## 2024-08-27 LAB — SEDIMENTATION RATE: Sed Rate: 2 mm/h (ref 0–30)

## 2024-08-27 LAB — SJOGRENS SYNDROME-A EXTRACTABLE NUCLEAR ANTIBODY: SSA (Ro) (ENA) Antibody, IgG: <1 AI

## 2024-08-27 LAB — ANTI-DNA ANTIBODY, DOUBLE-STRANDED: ds DNA Ab: 1 [IU]/mL

## 2024-08-27 LAB — SJOGRENS SYNDROME-B EXTRACTABLE NUCLEAR ANTIBODY: SSB (La) (ENA) Antibody, IgG: <1 AI

## 2024-08-27 LAB — RNP ANTIBODY: Ribonucleic Protein(ENA) Antibody, IgG: <1 AI

## 2024-08-29 NOTE — Progress Notes (Signed)
 ANA remains positive.  RNP negative, smith negative, Ro-, La-, dsDNA negative

## 2024-09-19 ENCOUNTER — Other Ambulatory Visit: Payer: Self-pay | Admitting: Family Medicine

## 2024-09-19 DIAGNOSIS — M797 Fibromyalgia: Secondary | ICD-10-CM

## 2024-09-20 NOTE — Telephone Encounter (Signed)
 Last refill there was a change sent in my chart message did not see where patient followed up to let you know how that change was working.   Requesting: Gabapentin   Contract: No UDS:  Last Visit: 04/30/2024 Next Visit: Visit date not found Last Refill: 07/19/24  Please Advise

## 2024-09-23 ENCOUNTER — Encounter: Payer: Self-pay | Admitting: Family Medicine

## 2024-09-23 DIAGNOSIS — J019 Acute sinusitis, unspecified: Secondary | ICD-10-CM

## 2024-09-23 NOTE — Telephone Encounter (Signed)
 Fyi to Dr. Reece Agar

## 2024-09-24 ENCOUNTER — Other Ambulatory Visit: Payer: Self-pay | Admitting: Family Medicine

## 2024-09-24 DIAGNOSIS — F325 Major depressive disorder, single episode, in full remission: Secondary | ICD-10-CM

## 2024-09-24 MED ORDER — AMOXICILLIN-POT CLAVULANATE 875-125 MG PO TABS: 1.0000 | ORAL_TABLET | Freq: Two times a day (BID) | ORAL | 0 refills | Status: AC

## 2024-09-24 MED ORDER — PREDNISONE 5 MG PO TABS: ORAL_TABLET | ORAL | 0 refills | Status: AC

## 2024-09-24 NOTE — Telephone Encounter (Signed)

## 2024-09-24 NOTE — Addendum Note (Signed)
 Addended by: RILLA BALLER on: 09/24/2024 05:26 PM   Modules accepted: Orders

## 2024-09-24 NOTE — Telephone Encounter (Signed)
 ERx

## 2024-09-24 NOTE — Telephone Encounter (Signed)
 Name of Medication:  Alprazolam  Name of Pharmacy:  [facility] Last Fill or Written Date and Quantity:  08/03/24, #50 Last Office Visit and Type:  04/30/24, CPE Next Office Visit and Type:  none Last Controlled Substance Agreement Date:  06/28/14 Last UDS:  06/28/14

## 2024-09-24 NOTE — Telephone Encounter (Signed)
 Ok to send in prednisone  20 mg tapering by 5 mg every 4 days.  Take prednisone  in the morning with food and avoid the use of NSAIDs

## 2024-09-28 ENCOUNTER — Telehealth: Payer: Self-pay

## 2024-09-28 ENCOUNTER — Other Ambulatory Visit (HOSPITAL_COMMUNITY): Payer: Self-pay

## 2024-09-28 NOTE — Telephone Encounter (Signed)
*  Primary  Pharmacy Patient Advocate Encounter   Received notification from Fax that prior authorization for Trulicity  1.5mg  is required/requested.   Insurance verification completed.   The patient is insured through [facility].   Per test claim: PA required; PA submitted to above mentioned insurance via Latent Key/confirmation #/EOC [id number] Status is pending

## 2024-09-29 NOTE — Telephone Encounter (Signed)
 Your request has been approved Approved. Authorization Expiration02/10/2025

## 2024-10-04 ENCOUNTER — Ambulatory Visit: Admitting: Orthopaedic Surgery

## 2025-02-23 ENCOUNTER — Ambulatory Visit: Admitting: Rheumatology
